# Patient Record
Sex: Male | Born: 1940 | Race: Black or African American | Hispanic: No | Smoking: Former smoker
Health system: Southern US, Community
[De-identification: ages and names within clinical notes are randomized; demographics above are authoritative.]

## PROBLEM LIST (undated history)

## (undated) DIAGNOSIS — I10 Essential (primary) hypertension: Secondary | ICD-10-CM

## (undated) DIAGNOSIS — E785 Hyperlipidemia, unspecified: Secondary | ICD-10-CM

## (undated) DIAGNOSIS — I2510 Atherosclerotic heart disease of native coronary artery without angina pectoris: Secondary | ICD-10-CM

## (undated) DIAGNOSIS — I4892 Unspecified atrial flutter: Secondary | ICD-10-CM

## (undated) DIAGNOSIS — G459 Transient cerebral ischemic attack, unspecified: Secondary | ICD-10-CM

## (undated) HISTORY — PX: OTHER SURGICAL HISTORY: SHX169

## (undated) HISTORY — PX: HERNIA REPAIR: SHX51

---

## 2010-08-27 ENCOUNTER — Emergency Department: Payer: Self-pay | Admitting: Emergency Medicine

## 2017-05-10 ENCOUNTER — Emergency Department
Admission: EM | Admit: 2017-05-10 | Discharge: 2017-05-10 | Disposition: A | Discharge status: Home / Self Care | Payer: Medicare HMO | Attending: Emergency Medicine | Admitting: Emergency Medicine

## 2017-05-10 ENCOUNTER — Encounter: Payer: Self-pay | Admitting: Emergency Medicine

## 2017-05-10 DIAGNOSIS — Z0130 Encounter for examination of blood pressure without abnormal findings: Secondary | ICD-10-CM | POA: Insufficient documentation

## 2017-05-10 DIAGNOSIS — Z5321 Procedure and treatment not carried out due to patient leaving prior to being seen by health care provider: Secondary | ICD-10-CM | POA: Insufficient documentation

## 2017-05-10 HISTORY — DX: Essential (primary) hypertension: I10

## 2017-05-10 NOTE — ED Notes (Signed)
After triage pt states his blood pressure was higher at home and that it is fine now.  Pt reports he wants to go home and eat [place] with family. Pt laughing appears in nad.  Pt advised he should stay and see MD, however, pt states he doesn't need to be seen anymore since his blood pressure is now better.

## 2017-05-10 NOTE — ED Triage Notes (Signed)
Pt to ed with c/o htn at home, mild headache, denies blurred vision, denies chest pain, denies sob.

## 2017-09-08 ENCOUNTER — Other Ambulatory Visit: Payer: Self-pay

## 2017-09-08 ENCOUNTER — Emergency Department: Payer: Medicare HMO

## 2017-09-08 ENCOUNTER — Encounter: Payer: Self-pay | Admitting: Emergency Medicine

## 2017-09-08 DIAGNOSIS — I4892 Unspecified atrial flutter: Secondary | ICD-10-CM | POA: Insufficient documentation

## 2017-09-08 DIAGNOSIS — Z8673 Personal history of transient ischemic attack (TIA), and cerebral infarction without residual deficits: Secondary | ICD-10-CM | POA: Diagnosis not present

## 2017-09-08 DIAGNOSIS — R079 Chest pain, unspecified: Secondary | ICD-10-CM | POA: Insufficient documentation

## 2017-09-08 DIAGNOSIS — I2510 Atherosclerotic heart disease of native coronary artery without angina pectoris: Secondary | ICD-10-CM | POA: Insufficient documentation

## 2017-09-08 DIAGNOSIS — R0602 Shortness of breath: Secondary | ICD-10-CM | POA: Diagnosis not present

## 2017-09-08 DIAGNOSIS — I10 Essential (primary) hypertension: Secondary | ICD-10-CM | POA: Diagnosis not present

## 2017-09-08 DIAGNOSIS — F17228 Nicotine dependence, chewing tobacco, with other nicotine-induced disorders: Secondary | ICD-10-CM | POA: Diagnosis not present

## 2017-09-08 LAB — BASIC METABOLIC PANEL
ANION GAP: 8 (ref 5–15)
BUN: 16 mg/dL (ref 6–20)
CHLORIDE: 106 mmol/L (ref 101–111)
CO2: 27 mmol/L (ref 22–32)
CREATININE: 0.97 mg/dL (ref 0.61–1.24)
Calcium: 9.4 mg/dL (ref 8.9–10.3)
GFR calc non Af Amer: >60 mL/min (ref >60)
Glucose, Bld: 101 mg/dL — ABNORMAL HIGH (ref 65–99)
POTASSIUM: 3.6 mmol/L (ref 3.5–5.1)
SODIUM: 141 mmol/L (ref 135–145)

## 2017-09-08 LAB — CBC
HCT: 42.9 % (ref 40.0–52.0)
Hemoglobin: 14.1 g/dL (ref 13.0–18.0)
MCH: 31.2 pg (ref 26.0–34.0)
MCHC: 32.8 g/dL (ref 32.0–36.0)
MCV: 95.2 fL (ref 80.0–100.0)
Platelets: 211 10*3/uL (ref 150–440)
RBC: 4.51 MIL/uL (ref 4.40–5.90)
RDW: 12.1 % (ref 11.5–14.5)
WBC: 7 10*3/uL (ref 3.8–10.6)

## 2017-09-08 LAB — TROPONIN I: Troponin I: <0.03 ng/mL (ref <0.03)

## 2017-09-08 NOTE — ED Notes (Signed)
Family to stat desk asking about wait time. Family given update on wait time. Family verbalized understanding.

## 2017-09-08 NOTE — ED Triage Notes (Signed)
Pt to ED via POV. Pt states that he has been having left sided chest pain since this morning. Pt states that is not having shortness of breath. Pt is in NAD at this time.

## 2017-09-09 ENCOUNTER — Emergency Department
Admission: EM | Admit: 2017-09-09 | Discharge: 2017-09-09 | Disposition: A | Discharge status: Home / Self Care | Payer: Medicare HMO | Attending: Emergency Medicine | Admitting: Emergency Medicine

## 2017-09-09 ENCOUNTER — Encounter: Payer: Self-pay | Admitting: Emergency Medicine

## 2017-09-09 DIAGNOSIS — I4892 Unspecified atrial flutter: Secondary | ICD-10-CM

## 2017-09-09 DIAGNOSIS — R079 Chest pain, unspecified: Secondary | ICD-10-CM | POA: Diagnosis not present

## 2017-09-09 HISTORY — DX: Atherosclerotic heart disease of native coronary artery without angina pectoris: I25.10

## 2017-09-09 HISTORY — DX: Transient cerebral ischemic attack, unspecified: G45.9

## 2017-09-09 HISTORY — DX: Unspecified atrial flutter: I48.92

## 2017-09-09 HISTORY — DX: Hyperlipidemia, unspecified: E78.5

## 2017-09-09 LAB — TROPONIN I: Troponin I: <0.03 ng/mL (ref <0.03)

## 2017-09-09 MED ORDER — NITROGLYCERIN 0.4 MG SL SUBL
0.4000 mg | SUBLINGUAL_TABLET | SUBLINGUAL | Status: DC | PRN
  Administered 2017-09-09 (×3): 0.4 mg via SUBLINGUAL
  Filled 2017-09-09: qty 1

## 2017-09-09 MED ORDER — DILTIAZEM HCL 60 MG PO TABS
30.0000 mg | ORAL_TABLET | Freq: Once | ORAL | Status: AC
  Administered 2017-09-09: 30 mg via ORAL

## 2017-09-09 MED ORDER — LORAZEPAM 1 MG PO TABS
1.0000 mg | ORAL_TABLET | Freq: Once | ORAL | Status: AC
  Administered 2017-09-09: 1 mg via ORAL
  Filled 2017-09-09: qty 1

## 2017-09-09 MED ORDER — DILTIAZEM HCL 60 MG PO TABS
ORAL_TABLET | ORAL | Status: AC
  Filled 2017-09-09: qty 1

## 2017-09-09 NOTE — ED Notes (Signed)
Report from butch, rn.

## 2017-09-09 NOTE — ED Provider Notes (Signed)
[facility] Emergency Department Provider Note   ____________________________________________   First MD Initiated Contact with Patient 09/09/17 0037     (approximate)  I have reviewed the triage vital signs and the nursing notes.   HISTORY  Chief Complaint Chest Pain    HPI Craig [name] is a 77 y.o. male who comes into the hospital today with some chest pain.  The patient states that he got up yesterday morning and his chest was hurting.  He also reports that his been doing funny things.  He states that the pain lightened up a little bit today but it is on his left side around where his heart is.  The patient has some mild shortness of breath and he reports that he has had no nausea no vomiting no dizziness or lightheadedness.  The patient rates his pain a 5 out of 10 in intensity currently.  He reports that it seems dull.  He has had similar pain in the past and he states that he has had a mild MI in the past although he does not believe it.  The patient decided to come into the hospital today for evaluation.  Past Medical History:  Diagnosis Date  . Atrial flutter ([place])   . Coronary artery disease   . Hyperlipidemia   . Hypertension   . TIA (transient ischemic attack)     There are no active problems to display for this patient.   Past Surgical History:  Procedure Laterality Date  . arm surgery Left   . HERNIA REPAIR      Prior to Admission medications   Not on File    Allergies Penicillins  No family history on file.  Social History Social History   Tobacco Use  . Smoking status: Former Research scientist (life sciences)  . Smokeless tobacco: Current User    Types: Snuff  Substance Use Topics  . Alcohol use: Yes  . Drug use: No    Review of Systems  Constitutional: No fever/chills Eyes: No visual changes. ENT: No sore throat. Cardiovascular:  chest pain. Respiratory:  shortness of breath. Gastrointestinal: No abdominal pain.  No nausea, no  vomiting.  No diarrhea.  No constipation. Genitourinary: Negative for dysuria. Musculoskeletal: Negative for back pain. Skin: Negative for rash. Neurological: Negative for headaches, focal weakness or numbness.   ____________________________________________   PHYSICAL EXAM:  VITAL SIGNS: ED Triage Vitals  Enc Vitals Group     BP 09/08/17 1712 (!) 160/95     Pulse Rate 09/08/17 1711 68     Resp 09/08/17 1711 16     Temp 09/08/17 1711 98 F (36.7 C)     Temp Source 09/08/17 1711 Oral     SpO2 09/08/17 1711 100 %     Weight 09/08/17 1713 185 lb (83.9 kg)     Height 09/08/17 1713 6' (1.829 m)     Head Circumference --      Peak Flow --      Pain Score 09/09/17 0035 4     Pain Loc --      Pain Edu? --      Excl. in [place]? --     Constitutional: Alert and oriented. Well appearing and in mild distress. Eyes: Conjunctivae are normal. PERRL. EOMI. Head: Atraumatic. Nose: No congestion/rhinnorhea. Mouth/Throat: Mucous membranes are moist.  Oropharynx non-erythematous. Cardiovascular: Normal rate, irregular rhythm. Grossly normal heart sounds.  Good peripheral circulation. Respiratory: Normal respiratory effort.  No retractions. Lungs CTAB. Gastrointestinal: Soft and nontender. No  distention.  Positive bowel sounds Musculoskeletal: No lower extremity tenderness nor edema.   Neurologic:  Normal speech and language. No gross focal neurologic deficits are appreciated. No gait instability. Skin:  Skin is warm, dry and intact. No rash noted. Psychiatric: Mood and affect are normal. Speech and behavior are normal.  ____________________________________________   LABS (all labs ordered are listed, but only abnormal results are displayed)  Labs Reviewed  BASIC METABOLIC PANEL - Abnormal; Notable for the following components:      Result Value   Glucose, Bld 101 (*)    All other components within normal limits  CBC  TROPONIN I  TROPONIN I    ____________________________________________  EKG  ED ECG REPORT I, Loney Hering, the attending physician, personally viewed and interpreted this ECG.   Date: 09/08/2017  EKG Time: 1717  Rate: 92  Rhythm: atrial flutter with variable block,  Axis: left axis deviation  Intervals:none  ST&T Change: none  ____________________________________________  RADIOLOGY  ED MD interpretation:  CXR: No active cardiopulmonary disease  Official radiology report(s): Dg Chest 2 View  Result Date: 09/08/2017 CLINICAL DATA:  Left-sided chest pain since this morning. EXAM: CHEST - 2 VIEW COMPARISON:  Report from 01/10/2000 FINDINGS: Cardiomegaly with minimal aortic atherosclerosis is noted. No aortic aneurysm. Lungs are clear without pulmonary consolidation, effusion or pneumothorax. Old left-sided rib fractures involving the left seventh through ninth ribs. IMPRESSION: No active cardiopulmonary disease. Cardiomegaly with mild aortic atherosclerosis. Electronically Signed   By: Ashley Royalty M.D.   On: 09/08/2017 17:44    ____________________________________________   PROCEDURES  Procedure(s) performed: None  Procedures  Critical Care performed: No  ____________________________________________   INITIAL IMPRESSION / ASSESSMENT AND PLAN / ED COURSE  As part of my medical decision making, I reviewed the following data within the electronic MEDICAL RECORD NUMBER Notes from prior ED visits and [place] Controlled Substance Database   This is a 77 year old male who comes into the hospital today with some chest pain.  My differential diagnosis includes acute coronary syndrome, pneumonia, musculoskeletal pain, gastritis versus reflux.  We did check a CBC, BMP and a troponin.  The patient also received a chest x-ray.  The patient's initial blood work is unremarkable.  His EKG did show atrial flutter but the patient was seen by Dr. Clayborn Bigness 1 year ago and has a diagnosis of atrial flutter for  which he is supposed to be taking Xarelto and aspirin.  Initially the patient's rate did appear on the monitor that it had increased and was fast.  I did give the patient a dose of diltiazem orally.  I did reassess the patient as looking at the EKG it did not appear as fast as recorded.  The nurse did a palpable rate check and the patient's rate was in the 50s.  We will repeat the troponin and the patient will also receive some nitroglycerin for pain.  He will be reassessed.     The patient's repeat troponin is negative.  We have continue to check the patient's pulse by palpation and it still remains in the 50s.  He did receive a dose of Ativan in the event that he was having any withdrawal since he does drink a shot of liquor daily.  The patient will be discharged to follow-up with Dr. Clayborn Bigness or with Dr. Rockey Situ should he desire to follow-up with a new physician as he requested.  ____________________________________________   FINAL CLINICAL IMPRESSION(S) / ED DIAGNOSES  Final diagnoses:  Chest pain,  unspecified type  Atrial flutter, unspecified type [facility])     ED Discharge Orders    None       Note:  This document was prepared using Dragon voice recognition software and may include unintentional dictation errors.    Loney Hering, MD 09/09/17 [phone]

## 2017-09-09 NOTE — ED Notes (Signed)
Pt refused meal tray or something to drink when offered. Pt stated he would "get something to eat on the way home".

## 2017-09-09 NOTE — ED Notes (Signed)
Spoke with charge RN dawn. Pt cannot leave post ativan administration for 4-6 hours. Pt informed he will need someone to drive him home, however pt states he cannot secure a ride. Pt informed he will need to wait until 7 or 8 am to be discharged. Pt agrees. Pt informed will move him to a hallway bed to await discharge.

## 2017-09-09 NOTE — ED Notes (Signed)
Sandwich tray and po fluids provided.

## 2017-09-09 NOTE — Discharge Instructions (Addendum)
I have evaluated your chest pain and your heart enzymes are negative.  You do have an abnormal heart rhythm but it has been diagnosed previously according to Dr. Etta Quill note.  Please follow-up with your cardiologist or with Dr. Rockey Situ for further evaluation of your symptoms.  Please ensure that you are taking your medications appropriately.

## 2017-09-09 NOTE — ED Notes (Signed)
MD order to discharge patient.

## 2017-09-09 NOTE — ED Notes (Signed)
Per Dr Dahlia Client, okay for pt to eat and drink.

## 2017-10-31 ENCOUNTER — Emergency Department: Payer: Medicare HMO

## 2017-10-31 ENCOUNTER — Other Ambulatory Visit: Payer: Self-pay

## 2017-10-31 ENCOUNTER — Emergency Department
Admission: EM | Admit: 2017-10-31 | Discharge: 2017-10-31 | Disposition: A | Discharge status: Home / Self Care | Payer: Medicare HMO | Attending: Emergency Medicine | Admitting: Emergency Medicine

## 2017-10-31 DIAGNOSIS — I119 Hypertensive heart disease without heart failure: Secondary | ICD-10-CM | POA: Insufficient documentation

## 2017-10-31 DIAGNOSIS — M549 Dorsalgia, unspecified: Secondary | ICD-10-CM

## 2017-10-31 DIAGNOSIS — I2510 Atherosclerotic heart disease of native coronary artery without angina pectoris: Secondary | ICD-10-CM | POA: Insufficient documentation

## 2017-10-31 DIAGNOSIS — M545 Low back pain: Secondary | ICD-10-CM | POA: Diagnosis not present

## 2017-10-31 DIAGNOSIS — R1084 Generalized abdominal pain: Secondary | ICD-10-CM | POA: Diagnosis present

## 2017-10-31 DIAGNOSIS — Z87891 Personal history of nicotine dependence: Secondary | ICD-10-CM | POA: Insufficient documentation

## 2017-10-31 LAB — URINALYSIS, COMPLETE (UACMP) WITH MICROSCOPIC
BILIRUBIN URINE: NEGATIVE
Bacteria, UA: NONE SEEN
GLUCOSE, UA: NEGATIVE mg/dL
HGB URINE DIPSTICK: NEGATIVE
KETONES UR: NEGATIVE mg/dL
LEUKOCYTES UA: NEGATIVE
NITRITE: NEGATIVE
PROTEIN: NEGATIVE mg/dL
Specific Gravity, Urine: 1.014 (ref 1.005–1.030)
WBC UA: NONE SEEN WBC/hpf (ref 0–5)
pH: 6 (ref 5.0–8.0)

## 2017-10-31 LAB — BASIC METABOLIC PANEL
ANION GAP: 10 (ref 5–15)
BUN: 12 mg/dL (ref 6–20)
CO2: 25 mmol/L (ref 22–32)
Calcium: 9.5 mg/dL (ref 8.9–10.3)
Chloride: 100 mmol/L — ABNORMAL LOW (ref 101–111)
Creatinine, Ser: 0.9 mg/dL (ref 0.61–1.24)
GFR calc Af Amer: >60 mL/min (ref >60)
GLUCOSE: 94 mg/dL (ref 65–99)
POTASSIUM: 3.1 mmol/L — AB (ref 3.5–5.1)
Sodium: 135 mmol/L (ref 135–145)

## 2017-10-31 LAB — CBC
HEMATOCRIT: 43.5 % (ref 40.0–52.0)
HEMOGLOBIN: 14.9 g/dL (ref 13.0–18.0)
MCH: 32.2 pg (ref 26.0–34.0)
MCHC: 34.2 g/dL (ref 32.0–36.0)
MCV: 94.2 fL (ref 80.0–100.0)
Platelets: 211 10*3/uL (ref 150–440)
RBC: 4.61 MIL/uL (ref 4.40–5.90)
RDW: 12.3 % (ref 11.5–14.5)
WBC: 6.6 10*3/uL (ref 3.8–10.6)

## 2017-10-31 MED ORDER — CYCLOBENZAPRINE HCL 10 MG PO TABS: 10.0000 mg | ORAL_TABLET | Freq: Three times a day (TID) | ORAL | 0 refills | Status: AC | PRN

## 2017-10-31 MED ORDER — IBUPROFEN 600 MG PO TABS: 600.0000 mg | ORAL_TABLET | Freq: Four times a day (QID) | ORAL | 0 refills | Status: DC | PRN

## 2017-10-31 MED ORDER — IBUPROFEN 400 MG PO TABS
600.0000 mg | ORAL_TABLET | Freq: Once | ORAL | Status: AC
  Administered 2017-10-31: 600 mg via ORAL
  Filled 2017-10-31: qty 2

## 2017-10-31 NOTE — ED Triage Notes (Signed)
To ER c/o left flank pain X 3-4 days. Denies NVD. Denies CP or SOB. Pt alert and oriented X4, active, cooperative, pt in NAD. RR even and unlabored, color WNL.  States he urinates too quickly.

## 2017-10-31 NOTE — ED Notes (Signed)
Pt presents with left lower flank pain that radiates to the other side at times. Pt states that he has a hx of prostate cancer and that when he urinates, that he is not peeing any different than he used to. Pt states that he has always had some burning on urination as well as some fast streams of urine. Pt states that pain is "6/10." No N/V/D. AxOx4.

## 2017-10-31 NOTE — Discharge Instructions (Signed)
Your CT scan shows a 4 cm mass or nodule in the liver.  You will need either an MRI or a contrast CT scan of the liver to further evaluate this.  You should make an appointment to follow-up with your doctor within the next 1 to 2 weeks, and show this discharge paperwork.  Your doctor can order this test as an outpatient.  You can take the medications as needed for the pain.  Do not drive or operate machinery while taking the Flexeril (cyclobenzaprine).    Return to the ER for new, worsening, or persistent severe pain, difficulty walking, weakness or numbness, vomiting, fever, or any other new or worsening symptoms that concern you.

## 2017-10-31 NOTE — ED Provider Notes (Signed)
[facility] Emergency Department Provider Note ____________________________________________   First MD Initiated Contact with Patient 10/31/17 1823     (approximate)  I have reviewed the triage vital signs and the nursing notes.   HISTORY  Chief Complaint Flank Pain    HPI Craig [name] is a 77 y.o. male with PMH as noted below as well as stated history of prostate cancer who presents with bilateral flank and lower back pain over the last 2 weeks, constant but intermittently worsened, and not associated with any specific position or activity.  He reports some chronic difficulty urinating due to his prostate problems.  No acute urinary symptoms.  No chest pain, abdominal pain, or difficulty breathing.  No fevers.  No prior history of this pain.   Past Medical History:  Diagnosis Date  . Atrial flutter ([place])   . Coronary artery disease   . Hyperlipidemia   . Hypertension   . TIA (transient ischemic attack)     There are no active problems to display for this patient.   Past Surgical History:  Procedure Laterality Date  . arm surgery Left   . HERNIA REPAIR      Prior to Admission medications   Medication Sig Start Date End Date Taking? Authorizing Provider  cyclobenzaprine (FLEXERIL) 10 MG tablet Take 1 tablet (10 mg total) by mouth 3 (three) times daily as needed for up to 5 days for muscle spasms. 10/31/17 11/05/17  Arta Silence, MD  ibuprofen (ADVIL,MOTRIN) 600 MG tablet Take 1 tablet (600 mg total) by mouth every 6 (six) hours as needed. 10/31/17   Arta Silence, MD    Allergies Penicillins  No family history on file.  Social History Social History   Tobacco Use  . Smoking status: Former Research scientist (life sciences)  . Smokeless tobacco: Current User    Types: Snuff  Substance Use Topics  . Alcohol use: Yes  . Drug use: No    Review of Systems  Constitutional: No fever. Eyes: No redness. ENT: No neck pain. Cardiovascular: Denies chest  pain. Respiratory: Denies shortness of breath. Gastrointestinal: No abdominal pain.  Genitourinary: Negative for dysuria.  Musculoskeletal: Positive for back pain. Skin: Negative for rash. Neurological: Negative for headaches, focal weakness or numbness.   ____________________________________________   PHYSICAL EXAM:  VITAL SIGNS: ED Triage Vitals [10/31/17 1504]  Enc Vitals Group     BP 121/81     Pulse Rate 100     Resp 18     Temp 98 F (36.7 C)     Temp Source Oral     SpO2 100 %     Weight 185 lb (83.9 kg)     Height      Head Circumference      Peak Flow      Pain Score 6     Pain Loc      Pain Edu?      Excl. in [place]?     Constitutional: Alert and oriented. Well appearing and in no acute distress. Eyes: Conjunctivae are normal.  Head: Atraumatic. Nose: No congestion/rhinnorhea. Mouth/Throat: Mucous membranes are moist.   Neck: Normal range of motion.  Cardiovascular:  Good peripheral circulation. Respiratory: Normal respiratory effort.  Gastrointestinal: Soft and nontender. No distention.  Genitourinary: No CVA tenderness. Musculoskeletal: No lower extremity edema.  Extremities warm and well perfused.  Bilateral mild lumbar paraspinal tenderness.  Mild midline tenderness with no step-off or crepitus. Neurologic:  Normal speech and language.  5/5 motor strength and intact  sensation to bilateral lower extremities.  Negative straight leg raise bilaterally.  No gross focal neurologic deficits are appreciated.  Skin:  Skin is warm and dry. No rash noted. Psychiatric: Mood and affect are normal. Speech and behavior are normal.  ____________________________________________   LABS (all labs ordered are listed, but only abnormal results are displayed)  Labs Reviewed  URINALYSIS, COMPLETE (UACMP) WITH MICROSCOPIC - Abnormal; Notable for the following components:      Result Value   Color, Urine YELLOW (*)    APPearance CLEAR (*)    All other components within  normal limits  BASIC METABOLIC PANEL - Abnormal; Notable for the following components:   Potassium 3.1 (*)    Chloride 100 (*)    All other components within normal limits  CBC   ____________________________________________  EKG   ____________________________________________  RADIOLOGY  CT abdomen: 4 cm liver mass.  No other acute findings. CT lumbar spine: No acute abnormalities.  ____________________________________________   PROCEDURES  Procedure(s) performed: No  Procedures  Critical Care performed: No ____________________________________________   INITIAL IMPRESSION / ASSESSMENT AND PLAN / ED COURSE  Pertinent labs & imaging results that were available during my care of the patient were reviewed by me and considered in my medical decision making (see chart for details).  77 year old male with PMH as noted above presents with subacute development of bilateral lower back and flank pain with no significant associated symptoms.  On exam, the patient is relatively well-appearing, vital signs are normal except for borderline heart rate, and the remainder the exam is as described above.  Past medical records reviewed in Epic; patient had one prior ED visit 2 months ago for chest pain with negative work-up.  Differential includes most likely muscular strain/spasm, DJD or arthritis, or other benign cause.  Patient's UA and lab work-up are unremarkable.  Given his apparent history of prostate cancer, malignancy or pathologic fracture is also a possibility.  I will obtain a noncontrast CT of the abdomen as well as the lumbar spine to further evaluate.  If this is negative, anticipate discharge home with outpatient follow-up.    ----------------------------------------- 8:26 PM on 10/31/2017 -----------------------------------------  CT shows no acute findings to explain the patient's pain.  No evidence of pathologic fracture or ureteral stone.  There is a 4 cm mass in the  liver which is not consistent with patient's acute symptoms.  Patient had another brief episode of more severe pain which appears to be consistent with a spasm, and was relieved with certain positioning.  Overall presentation is most consistent with muscle strain/spasm.  I will discharge with muscle relaxant and NSAID.  I had an extensive discussion with the patient about the results of his work-up including the liver findings on the CT  And the importance of close outpatient follow-up for additional outpatient imaging.  Patient expressed understanding.  He agrees to follow-up with his primary care doctor.  Return precautions given, and the patient expressed understanding of these as well.  ____________________________________________   FINAL CLINICAL IMPRESSION(S) / ED DIAGNOSES  Final diagnoses:  Back pain      NEW MEDICATIONS STARTED DURING THIS VISIT:  New Prescriptions   CYCLOBENZAPRINE (FLEXERIL) 10 MG TABLET    Take 1 tablet (10 mg total) by mouth 3 (three) times daily as needed for up to 5 days for muscle spasms.   IBUPROFEN (ADVIL,MOTRIN) 600 MG TABLET    Take 1 tablet (600 mg total) by mouth every 6 (six) hours as needed.  Note:  This document was prepared using Dragon voice recognition software and may include unintentional dictation errors.    Arta Silence, MD 10/31/17 2027

## 2017-12-25 ENCOUNTER — Encounter: Payer: Self-pay | Admitting: Cardiovascular Disease

## 2017-12-25 ENCOUNTER — Ambulatory Visit (INDEPENDENT_AMBULATORY_CARE_PROVIDER_SITE_OTHER): Payer: Medicare HMO | Admitting: Cardiovascular Disease

## 2017-12-25 VITALS — BP 130/76 | HR 58 | Ht 72.0 in | Wt 179.0 lb

## 2017-12-25 DIAGNOSIS — I25118 Atherosclerotic heart disease of native coronary artery with other forms of angina pectoris: Secondary | ICD-10-CM

## 2017-12-25 DIAGNOSIS — Z955 Presence of coronary angioplasty implant and graft: Secondary | ICD-10-CM | POA: Diagnosis not present

## 2017-12-25 DIAGNOSIS — R079 Chest pain, unspecified: Secondary | ICD-10-CM

## 2017-12-25 DIAGNOSIS — Z87891 Personal history of nicotine dependence: Secondary | ICD-10-CM | POA: Diagnosis not present

## 2017-12-25 DIAGNOSIS — E785 Hyperlipidemia, unspecified: Secondary | ICD-10-CM | POA: Insufficient documentation

## 2017-12-25 DIAGNOSIS — I10 Essential (primary) hypertension: Secondary | ICD-10-CM | POA: Diagnosis not present

## 2017-12-25 DIAGNOSIS — R413 Other amnesia: Secondary | ICD-10-CM | POA: Insufficient documentation

## 2017-12-25 DIAGNOSIS — E782 Mixed hyperlipidemia: Secondary | ICD-10-CM | POA: Diagnosis not present

## 2017-12-25 DIAGNOSIS — I2510 Atherosclerotic heart disease of native coronary artery without angina pectoris: Secondary | ICD-10-CM | POA: Insufficient documentation

## 2017-12-25 NOTE — Progress Notes (Signed)
Cardiology Office Note  Date:  12/25/2017   ID:  Craig [name], DOB [birth year], MRN [id number]  PCP:  [facility]   Chief Complaint  Patient presents with  . New Patient (Initial Visit)    Referral from [facility] for Hypertension. Patient denies chest pain and SOB. Meds reviewed verbally with patient.     HPI:  Craig [name] is a 77 year old gentleman with past medical history of Atrial fibrillation Atrial flutter Hypertension Previously referred to EP but refused ablation for atrial flutter TIA PAD Coronary disease Hyperlipidemia Syncope Smoking, quit On aricept Who presents by referral from [facility] clinic  Mildly agitated on today's visit Blood pressure up and down, but does not have any numbers from home Does not think it is too low Sometimes runs high in certain situations  Previously evaluated by cardiology at [facility] He denies any chest pain concerning for angina  Does not like pills Does not want a cholesterol medication at this time  Active with no regular exercise program  Long discussion concerning his arrhythmia and atrial flutter Reports he is asymptomatic Compliant with his anticoagulation  Notes reviewed indicating MI in 1990s with stent placement  EKG personally reviewed by myself on todays visit Shows atrial flutter, ventricular rate 22    PMH:   has a past medical history of Atrial flutter ([place]), Coronary artery disease, Hyperlipidemia, Hypertension, and TIA (transient ischemic attack).  PSH:    Past Surgical History:  Procedure Laterality Date  . arm surgery Left   . HERNIA REPAIR      Current Outpatient Medications  Medication Sig Dispense Refill  . donepezil (ARICEPT) 10 MG tablet TK 1 T PO HS FOR MEMORY  5  . lisinopril-hydrochlorothiazide (PRINZIDE,ZESTORETIC) 20-25 MG tablet TAKE ONE TABLET BY MOUTH EVERY DAY    . rivaroxaban (XARELTO) 20 MG TABS tablet Take by mouth.     No current facility-administered  medications for this visit.      Allergies:   Penicillins   Social History:  The patient  reports that he has quit smoking. He smoked 0.00 packs per day. His smokeless tobacco use includes snuff. He reports that he drinks alcohol. He reports that he does not use drugs.   Family History:   family history is not on file.    Review of Systems: Review of Systems  Constitutional: Negative.   Respiratory: Negative.   Cardiovascular: Negative.   Gastrointestinal: Negative.   Musculoskeletal: Negative.   Neurological: Negative.   Psychiatric/Behavioral: The patient is nervous/anxious.   All other systems reviewed and are negative.    PHYSICAL EXAM: VS:  BP 130/76 (BP Location: Right Arm, Patient Position: Sitting, Cuff Size: Normal)   Pulse (!) 58   Ht 6' (1.829 m)   Wt 179 lb (81.2 kg)   BMI 24.28 kg/m  , BMI Body mass index is 24.28 kg/m. GEN: Well nourished, well developed, in no acute distress  HEENT: normal  Neck: no JVD, carotid bruits, or masses Cardiac: RRR; no murmurs, rubs, or gallops,no edema  Respiratory:  clear to auscultation bilaterally, normal work of breathing GI: soft, nontender, nondistended, + BS MS: no deformity or atrophy  Skin: warm and dry, no rash Neuro:  Strength and sensation are intact Psych: euthymic mood, full affect   Recent Labs: 10/31/2017: BUN 12; Creatinine, Ser 0.90; Hemoglobin 14.9; Platelets 211; Potassium 3.1; Sodium 135    Lipid Panel No results found for: CHOL, HDL, LDLCALC, TRIG    Wt Readings from Last  3 Encounters:  12/25/17 179 lb (81.2 kg)  10/31/17 185 lb (83.9 kg)  09/08/17 185 lb (83.9 kg)       ASSESSMENT AND PLAN:  Coronary artery disease of native artery of native heart with stable angina pectoris (HCC) Currently with no symptoms of angina. No further workup at this time. Continue current medication regimen.  History of coronary artery stent placement We have requested records Currently with no anginal  symptoms  Mixed hyperlipidemia Does not want cholesterol medication For first to keep his list very short  Former smoker Previously active smoker now very passive smoker Recommended smoking cessation  Essential hypertension Blood pressure is well controlled on today's visit. No changes made to the medications.  Memory loss On Aricept Management primary care  Disposition:   F/U  6 months   Total encounter time more than 60 minutes  Greater than 50% was spent in counseling and coordination of care with the patient    Orders Placed This Encounter  Procedures  . EKG 12-Lead     Signed, Esmond Plants, M.D., Ph.D. 12/25/2017  Craig [name], [place]

## 2017-12-25 NOTE — Patient Instructions (Signed)

## 2019-02-13 ENCOUNTER — Encounter: Payer: Self-pay | Admitting: Urology

## 2019-02-13 ENCOUNTER — Ambulatory Visit: Payer: Self-pay | Admitting: Urology

## 2019-05-14 IMAGING — CR DG CHEST 2V
1 series · 2 of 2 positions shown · non-contrast
Comparison: Report from 01/10/2000

CLINICAL DATA: Left-sided chest pain since this morning.

EXAM:
CHEST - 2 VIEW

[Series 1: dg chest 2 view · 0.14mm/px · 2 of 2 slices shown]
[im 1/2]
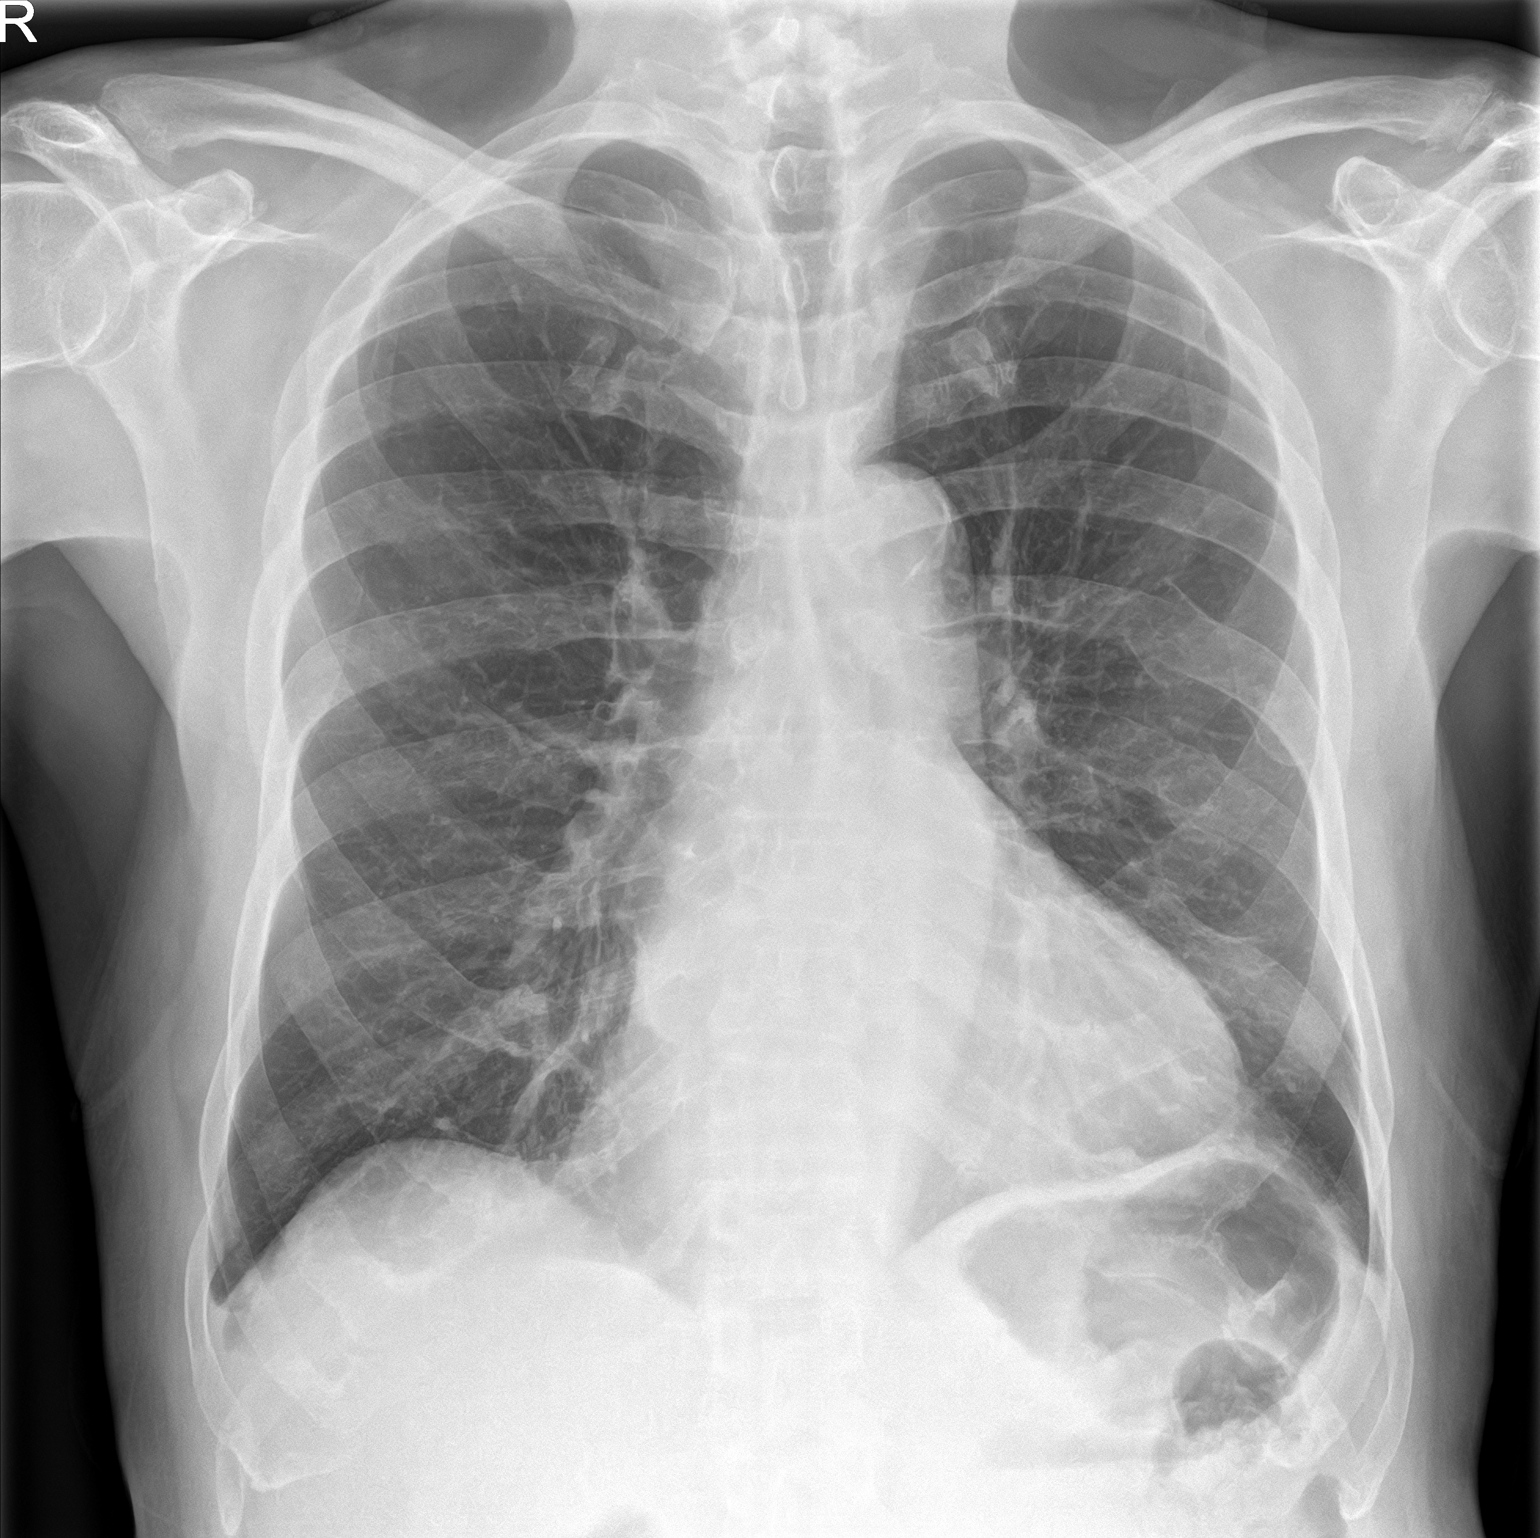
[im 2/2]
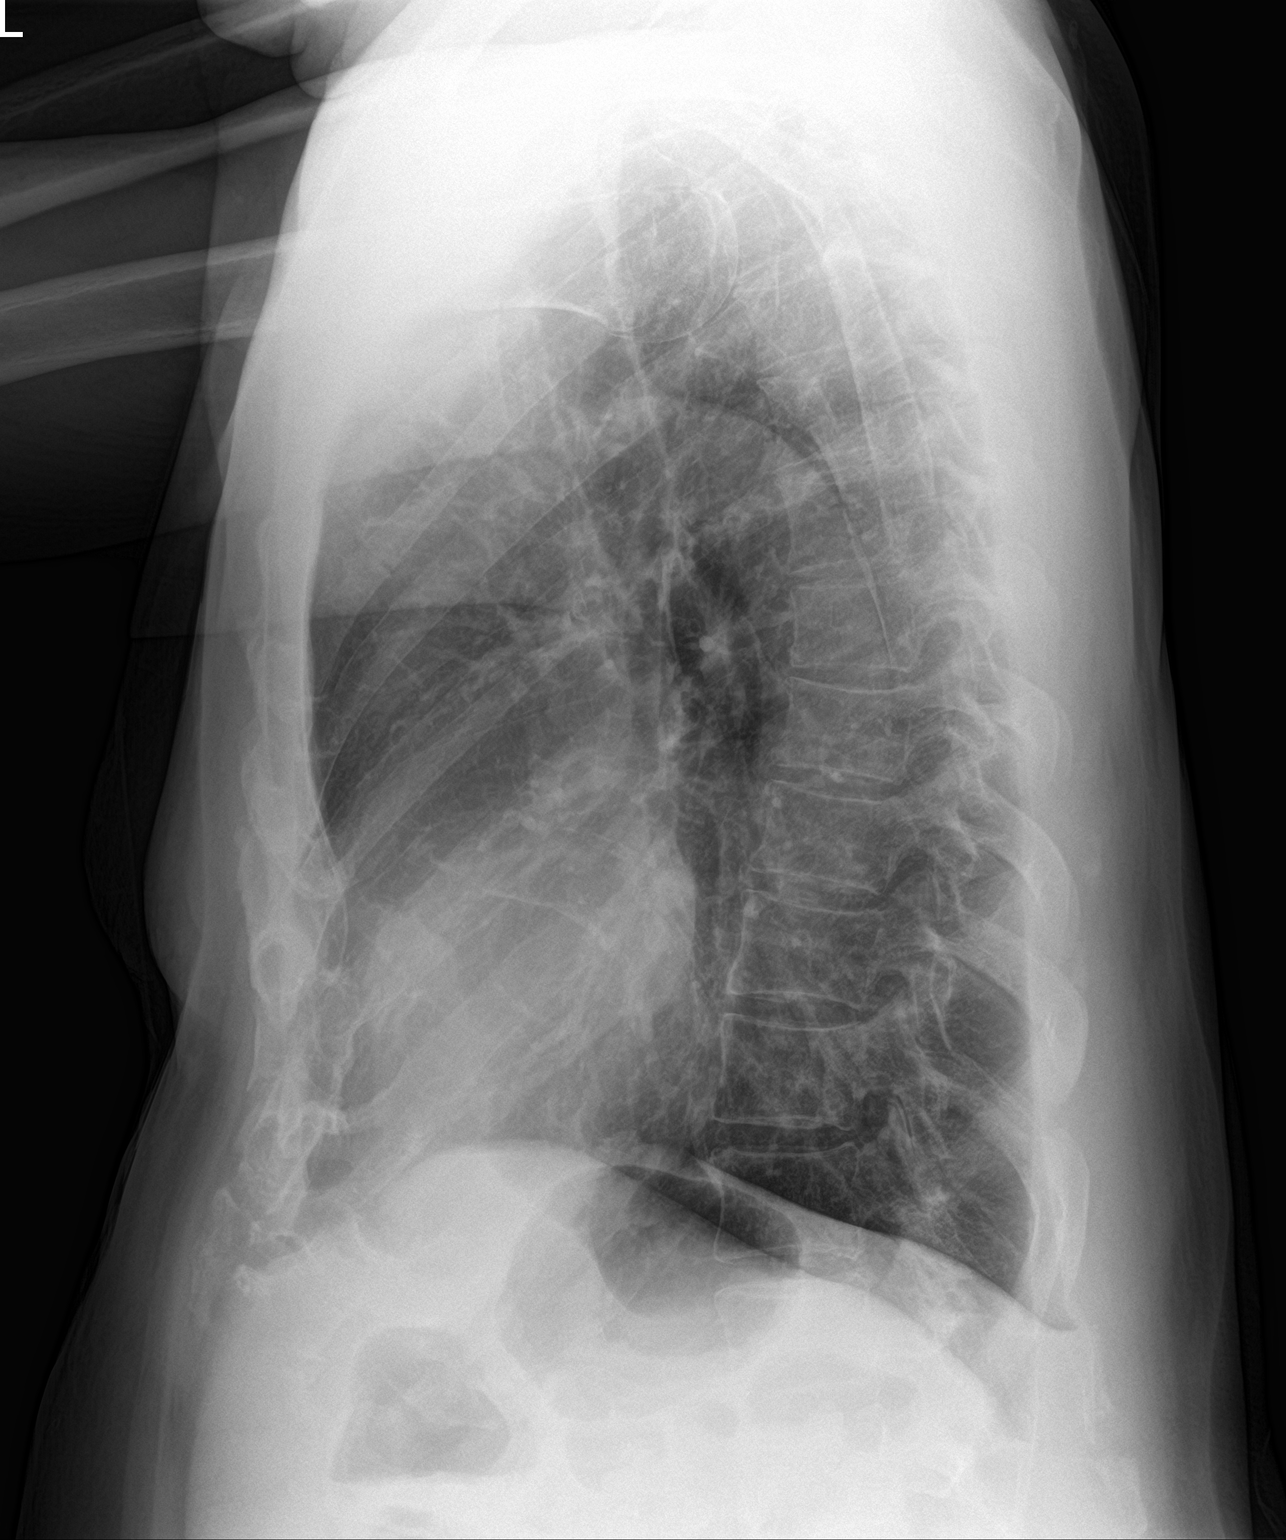

[2 of 2 positions shown; findings below may reference images not displayed]

FINDINGS: Cardiomegaly with minimal aortic atherosclerosis is noted. No aortic
aneurysm. Lungs are clear without pulmonary consolidation, effusion
or pneumothorax. Old left-sided rib fractures involving the left
seventh through ninth ribs.
IMPRESSION: No active cardiopulmonary disease. Cardiomegaly with mild aortic
atherosclerosis.

## 2019-10-17 ENCOUNTER — Other Ambulatory Visit: Payer: Self-pay | Admitting: Family Medicine

## 2019-10-22 ENCOUNTER — Ambulatory Visit: Payer: No Typology Code available for payment source | Admitting: Urology

## 2019-10-22 ENCOUNTER — Encounter: Payer: Self-pay | Admitting: Urology

## 2020-04-20 ENCOUNTER — Encounter: Payer: Self-pay | Admitting: Emergency Medicine

## 2020-04-20 ENCOUNTER — Other Ambulatory Visit: Payer: Self-pay

## 2020-04-20 ENCOUNTER — Emergency Department: Payer: Medicare (Managed Care)

## 2020-04-20 ENCOUNTER — Inpatient Hospital Stay
Admission: EM | Admit: 2020-04-20 | Discharge: 2020-04-26 | DRG: 884 | Disposition: A | Discharge status: Home Health | Payer: Medicare (Managed Care) | Attending: Internal Medicine | Admitting: Internal Medicine

## 2020-04-20 DIAGNOSIS — F17290 Nicotine dependence, other tobacco product, uncomplicated: Secondary | ICD-10-CM | POA: Diagnosis present

## 2020-04-20 DIAGNOSIS — E785 Hyperlipidemia, unspecified: Secondary | ICD-10-CM | POA: Diagnosis present

## 2020-04-20 DIAGNOSIS — F03918 Unspecified dementia, unspecified severity, with other behavioral disturbance: Secondary | ICD-10-CM

## 2020-04-20 DIAGNOSIS — E43 Unspecified severe protein-calorie malnutrition: Secondary | ICD-10-CM | POA: Diagnosis present

## 2020-04-20 DIAGNOSIS — R001 Bradycardia, unspecified: Secondary | ICD-10-CM | POA: Diagnosis present

## 2020-04-20 DIAGNOSIS — Z20822 Contact with and (suspected) exposure to covid-19: Secondary | ICD-10-CM | POA: Diagnosis present

## 2020-04-20 DIAGNOSIS — I119 Hypertensive heart disease without heart failure: Secondary | ICD-10-CM | POA: Diagnosis present

## 2020-04-20 DIAGNOSIS — I4892 Unspecified atrial flutter: Secondary | ICD-10-CM | POA: Diagnosis present

## 2020-04-20 DIAGNOSIS — I252 Old myocardial infarction: Secondary | ICD-10-CM

## 2020-04-20 DIAGNOSIS — R4182 Altered mental status, unspecified: Secondary | ICD-10-CM | POA: Diagnosis not present

## 2020-04-20 DIAGNOSIS — Z8673 Personal history of transient ischemic attack (TIA), and cerebral infarction without residual deficits: Secondary | ICD-10-CM

## 2020-04-20 DIAGNOSIS — E871 Hypo-osmolality and hyponatremia: Secondary | ICD-10-CM | POA: Diagnosis not present

## 2020-04-20 DIAGNOSIS — F0390 Unspecified dementia without behavioral disturbance: Secondary | ICD-10-CM

## 2020-04-20 DIAGNOSIS — R413 Other amnesia: Secondary | ICD-10-CM | POA: Diagnosis present

## 2020-04-20 DIAGNOSIS — I4891 Unspecified atrial fibrillation: Secondary | ICD-10-CM | POA: Diagnosis present

## 2020-04-20 DIAGNOSIS — Z602 Problems related to living alone: Secondary | ICD-10-CM | POA: Diagnosis present

## 2020-04-20 DIAGNOSIS — Z880 Allergy status to penicillin: Secondary | ICD-10-CM

## 2020-04-20 DIAGNOSIS — R54 Age-related physical debility: Secondary | ICD-10-CM | POA: Diagnosis present

## 2020-04-20 DIAGNOSIS — F0391 Unspecified dementia with behavioral disturbance: Secondary | ICD-10-CM | POA: Diagnosis not present

## 2020-04-20 DIAGNOSIS — F17210 Nicotine dependence, cigarettes, uncomplicated: Secondary | ICD-10-CM | POA: Diagnosis present

## 2020-04-20 DIAGNOSIS — Z79899 Other long term (current) drug therapy: Secondary | ICD-10-CM

## 2020-04-20 DIAGNOSIS — I2510 Atherosclerotic heart disease of native coronary artery without angina pectoris: Secondary | ICD-10-CM | POA: Diagnosis present

## 2020-04-20 DIAGNOSIS — I10 Essential (primary) hypertension: Secondary | ICD-10-CM | POA: Diagnosis present

## 2020-04-20 DIAGNOSIS — Z955 Presence of coronary angioplasty implant and graft: Secondary | ICD-10-CM

## 2020-04-20 LAB — URINALYSIS, COMPLETE (UACMP) WITH MICROSCOPIC
Bacteria, UA: NONE SEEN
Bilirubin Urine: NEGATIVE
Glucose, UA: NEGATIVE mg/dL
Hgb urine dipstick: NEGATIVE
Ketones, ur: NEGATIVE mg/dL
Leukocytes,Ua: NEGATIVE
Nitrite: NEGATIVE
Protein, ur: NEGATIVE mg/dL
Specific Gravity, Urine: 1.01 (ref 1.005–1.030)
Squamous Epithelial / HPF: NONE SEEN (ref 0–5)
WBC, UA: NONE SEEN WBC/hpf (ref 0–5)
pH: 6 (ref 5.0–8.0)

## 2020-04-20 LAB — URINE DRUG SCREEN, QUALITATIVE (ARMC ONLY)
Amphetamines, Ur Screen: NOT DETECTED
Barbiturates, Ur Screen: NOT DETECTED
Benzodiazepine, Ur Scrn: NOT DETECTED
Cannabinoid 50 Ng, Ur ~~LOC~~: NOT DETECTED
Cocaine Metabolite,Ur ~~LOC~~: NOT DETECTED
MDMA (Ecstasy)Ur Screen: NOT DETECTED
Methadone Scn, Ur: NOT DETECTED
Opiate, Ur Screen: NOT DETECTED
Phencyclidine (PCP) Ur S: NOT DETECTED
Tricyclic, Ur Screen: NOT DETECTED

## 2020-04-20 LAB — MAGNESIUM: Magnesium: 2.1 mg/dL (ref 1.7–2.4)

## 2020-04-20 LAB — COMPREHENSIVE METABOLIC PANEL
ALT: 15 U/L (ref 0–44)
AST: 17 U/L (ref 15–41)
Albumin: 3.3 g/dL — ABNORMAL LOW (ref 3.5–5.0)
Alkaline Phosphatase: 57 U/L (ref 38–126)
Anion gap: 9 (ref 5–15)
BUN: 12 mg/dL (ref 8–23)
CO2: 25 mmol/L (ref 22–32)
Calcium: 8.7 mg/dL — ABNORMAL LOW (ref 8.9–10.3)
Chloride: 97 mmol/L — ABNORMAL LOW (ref 98–111)
Creatinine, Ser: 0.8 mg/dL (ref 0.61–1.24)
GFR, Estimated: >60 mL/min (ref >60)
Glucose, Bld: 86 mg/dL (ref 70–99)
Potassium: 4 mmol/L (ref 3.5–5.1)
Sodium: 131 mmol/L — ABNORMAL LOW (ref 135–145)
Total Bilirubin: 1 mg/dL (ref 0.3–1.2)
Total Protein: 9 g/dL — ABNORMAL HIGH (ref 6.5–8.1)

## 2020-04-20 LAB — RESPIRATORY PANEL BY RT PCR (FLU A&B, COVID)
Influenza A by PCR: NEGATIVE
Influenza B by PCR: NEGATIVE
SARS Coronavirus 2 by RT PCR: NEGATIVE

## 2020-04-20 LAB — TROPONIN I (HIGH SENSITIVITY)
Troponin I (High Sensitivity): 10 ng/L (ref <18)
Troponin I (High Sensitivity): 10 ng/L (ref <18)

## 2020-04-20 LAB — CBC
HCT: 40.2 % (ref 39.0–52.0)
Hemoglobin: 13.3 g/dL (ref 13.0–17.0)
MCH: 31.7 pg (ref 26.0–34.0)
MCHC: 33.1 g/dL (ref 30.0–36.0)
MCV: 95.9 fL (ref 80.0–100.0)
Platelets: 143 10*3/uL — ABNORMAL LOW (ref 150–400)
RBC: 4.19 MIL/uL — ABNORMAL LOW (ref 4.22–5.81)
RDW: 11.8 % (ref 11.5–15.5)
WBC: 3.4 10*3/uL — ABNORMAL LOW (ref 4.0–10.5)
nRBC: 0 % (ref 0.0–0.2)

## 2020-04-20 LAB — LIPASE, BLOOD: Lipase: 25 U/L (ref 11–51)

## 2020-04-20 LAB — VITAMIN B12: Vitamin B-12: 210 pg/mL (ref 180–914)

## 2020-04-20 LAB — TSH: TSH: 2.257 u[IU]/mL (ref 0.350–4.500)

## 2020-04-20 MED ORDER — ACETAMINOPHEN 650 MG RE SUPP: 650.0000 mg | Freq: Four times a day (QID) | RECTAL | Status: DC | PRN

## 2020-04-20 MED ORDER — SODIUM CHLORIDE 0.9% FLUSH
3.0000 mL | Freq: Two times a day (BID) | INTRAVENOUS | Status: DC
  Administered 2020-04-20 – 2020-04-26 (×11): 3 mL via INTRAVENOUS

## 2020-04-20 MED ORDER — HALOPERIDOL LACTATE 5 MG/ML IJ SOLN
INTRAMUSCULAR | Status: AC
  Administered 2020-04-20: 2 mg via INTRAVENOUS
  Filled 2020-04-20: qty 1

## 2020-04-20 MED ORDER — LORAZEPAM 2 MG/ML IJ SOLN
1.0000 mg | Freq: Once | INTRAMUSCULAR | Status: AC
  Administered 2020-04-20: 1 mg via INTRAVENOUS
  Filled 2020-04-20: qty 1

## 2020-04-20 MED ORDER — ENOXAPARIN SODIUM 40 MG/0.4ML ~~LOC~~ SOLN
40.0000 mg | SUBCUTANEOUS | Status: DC
  Administered 2020-04-20 – 2020-04-25 (×5): 40 mg via SUBCUTANEOUS
  Filled 2020-04-20 (×6): qty 0.4

## 2020-04-20 MED ORDER — HALOPERIDOL LACTATE 5 MG/ML IJ SOLN: 2.0000 mg | Freq: Once | INTRAMUSCULAR | Status: AC

## 2020-04-20 MED ORDER — NICOTINE 21 MG/24HR TD PT24
21.0000 mg | MEDICATED_PATCH | Freq: Every day | TRANSDERMAL | Status: DC
  Administered 2020-04-20 – 2020-04-23 (×3): 21 mg via TRANSDERMAL
  Filled 2020-04-20 (×4): qty 1

## 2020-04-20 MED ORDER — IOHEXOL 300 MG/ML  SOLN
100.0000 mL | Freq: Once | INTRAMUSCULAR | Status: AC | PRN
  Administered 2020-04-20: 100 mL via INTRAVENOUS

## 2020-04-20 MED ORDER — ACETAMINOPHEN 325 MG PO TABS
650.0000 mg | ORAL_TABLET | Freq: Four times a day (QID) | ORAL | Status: DC | PRN
  Administered 2020-04-22: 650 mg via ORAL
  Filled 2020-04-20: qty 2

## 2020-04-20 NOTE — ED Notes (Signed)
Pt sleeping soundly. Pt heart rate range from 37-68.   Dr. Posey Pronto came in to talk to pt. Pt confused. Unaware of place or time. States he lives alone with no close family. Unaware of why in the hospital.    lw edt

## 2020-04-20 NOTE — ED Notes (Signed)
Assisted patient to the toilet and performed linen change. Dry brief applied. This tech still sitting with the patient 1:1 IVC.

## 2020-04-20 NOTE — ED Provider Notes (Signed)
-----------------------------------------   6:33 PM on 04/20/2020 -----------------------------------------  Patient's CT results are still not in the computer.  We called CT and they are going to print the results and bring them they report there are no acute problems in either the head of the belly CT.  Patient has becoming disruptive and insisting on leaving.  He is unable to care for himself at home.  He is going to be given 2 of Haldol IV in an attempt to calm him down.  I have consulted the hospitalist for medical admission.   Nena Polio, MD 04/20/20 [phone]

## 2020-04-20 NOTE — ED Notes (Signed)
Patient is now resting comfortably in bed. Monitor cables placed back on pt. x2 rails up. No other needs found at this moment. This tech remains sitting 1:1 IVC.

## 2020-04-20 NOTE — ED Notes (Signed)
Called lab for tech to come recollect bloodwork.

## 2020-04-20 NOTE — ED Notes (Addendum)
This tech is currently sitting 1:1 IVC. Pt. is very upset for not having nothing eat. Per Quentin Cornwall, MD [employer] for him to have apple sauce at this moment.

## 2020-04-20 NOTE — ED Notes (Signed)
Pt kept trying to get up from bed, pulling monitor cables off, and would not follow command. Pt very confused and "trying to go home." This tech was unable to get pt back in bed so more back up was called to help assist pt back in bed.

## 2020-04-20 NOTE — H&P (Signed)
History and Physical    Craig [name] LKG:[id number] DOB: [birth year] DOA: 04/20/2020  PCP: Craig Merles, MD  Patient coming from: Home via EMS  I have personally briefly reviewed patient's old medical records in [place]  Chief Complaint: Abnormal mentation  HPI: Craig [name] is a 79 y.o. 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  History is limited from patient due to altered mental status and is otherwise supplemented by EDP, chart review, and daughter by phone.  Presenting history is unclear.  Per ED documentation EMS were called to his residence.  He was apparently agitated but also complaining of chest and abdominal pain.  He was brought to the ED for further evaluation.  He was agitated on arrival and disoriented.  He was repetitively saying that God has given ESP per ED documentation.  He is not felt to have capacity to leave [facility] therefore he was placed under IVC.  At time of my evaluation, patient is sleeping comfortably after receiving Haldol.  He awakens easily but only intermittently answering questions.  He is not sure why he is in the hospital.  He denies any pain.  He is able to tell me his full name but is not oriented to place or time.  He is not able to tell me his daughter's name.  He says he lives alone.  He says he does smoke cigarettes, up to a pack per day.  He reports rare alcohol use.  He denies any cocaine or injection drug use.  I discussed with his daughter by phone.  She was unaware he was in the hospital.  She says that he is normally able to communicate well and complete all his ADLs.  She is not aware of any underlying psychiatric history but does state that he has a reported history of dementia.  She says he was on medication for memory (Aricept) but does not think he has been  taking this.  She says he has been taking his blood pressure medicine.  She says he last saw his primary care physician, Dr. Lennox [name], earlier this summer.  ED Course:  Initial vitals showed BP 151/61, pulse 83, RR 20, temp 97.7 Fahrenheit, SPO2 98% on room air. While in the waiting room, patient was noted to have bradycardia with heart rate as low as 35 and was brought into ED from urgent evaluation. Subsequent documented heart rate has been maintaining greater than 50 bpm. He has remained in atrial flutter.  Labs show WBC 3.4, hemoglobin 13.3, platelets 143,000, sodium 131, potassium 4.0, bicarb 25, BUN 12, creatinine 0.80, serum glucose 86, LFTs within normal limits, lipase 25, high-sensitivity troponin I 10x2, TSH 2.257. Urinalysis is negative for UTI. B12 and folate were obtained and pending.  SARS-CoV-2 PCR is negative. Influenza A/B PCR's are negative.  Portable chest x-ray showed mildly enlarged cardiac silhouette without focal consolidation, edema, or effusion.  CT abdomen/pelvis with contrast was negative for acute intra-abdominal or intrapelvic process. A stable right lobe liver hemangioma was noted.  CT head without contrast was negative for acute intracranial abnormality. Generalized atrophy and moderate evidence of chronic small vessel ischemia is noted.  Patient was given IV Ativan 1 mg once and IV Haldol 2 mg once. Patient was placed under IVC while in the ED. Case was discussed with on-call cardiology who recommended medical observation to  ensure no further bradycardia. The hospitalist service was consulted to admit for further evaluation and management.  Review of Systems:  Unable to obtain full review of systems due to mental status.  Past Medical History:  Diagnosis Date  . Atrial flutter ([place])   . Coronary artery disease   . Hyperlipidemia   . Hypertension   . TIA (transient ischemic attack)     Past Surgical History:  Procedure Laterality Date  . arm surgery Left    . HERNIA REPAIR      Social History:  reports that he has quit smoking. He smoked 0.00 packs per day. His smokeless tobacco use includes snuff. He reports current alcohol use. He reports that he does not use drugs.  Allergies  Allergen Reactions  . Penicillins     No family history on file.   Prior to Admission medications   Medication Sig Start Date End Date Taking? Authorizing Provider  donepezil (ARICEPT) 10 MG tablet TK 1 T PO HS FOR MEMORY 12/18/17  Yes [provider]  lisinopril-hydrochlorothiazide (PRINZIDE,ZESTORETIC) 20-25 MG tablet TAKE ONE TABLET BY MOUTH EVERY DAY 02/01/15  Yes [provider]  oxybutynin (DITROPAN) 5 MG tablet Take 5 mg by mouth at bedtime. 02/05/20  Yes [provider]    Physical Exam: Vitals:   04/20/20 1830 04/20/20 1845 04/20/20 1848 04/20/20 1900  BP:  (!) 165/81 (!) 165/81 (!) 161/79  Pulse:  71 62 (!) 55  Resp: 14 17 16 16   Temp:      TempSrc:      SpO2:  98% 100% 100%  Weight:      Height:       Constitutional: Resting in bed, NAD, calm, comfortable, sleeping but easily awakens and will intermittently answer questions. Eyes: PERRL, EOMI, lids and conjunctivae normal ENMT: Mucous membranes are dry. Posterior pharynx clear of any exudate or lesions.poor dentition.  Neck: normal, supple, no masses. Respiratory: clear to auscultation bilaterally, no wheezing, no crackles. Normal respiratory effort. No accessory muscle use.  Cardiovascular: Bradycardic with irregularly irregular rhythm, no murmurs / rubs / gallops. No extremity edema. 2+ pedal pulses. Abdomen: no tenderness, no masses palpated. No hepatosplenomegaly. Bowel sounds positive.  Musculoskeletal: no clubbing / cyanosis. No joint deformity upper and lower extremities. Good ROM, no contractures. Normal muscle tone.  Skin: no rashes, lesions, ulcers. No induration Neurologic: CN 2-12 grossly intact. Sensation intact, Strength 5/5 in all 4.  Psychiatric:  Sleeping on my exam, easily awakens.  He is oriented to self but not to place or time.  He is not able to tell me his daughter's name.  He is not able to tell me why he came to the hospital.  He denies seeing or hearing things which are not present.  Per ED documentation, on arrival he was repeatedly saying that God has given him ESP.  Patient more cooperative after receiving Haldol.   Labs on Admission: I have personally reviewed following labs and imaging studies  CBC: Recent Labs  Lab 04/20/20 1234  WBC 3.4*  HGB 13.3  HCT 40.2  MCV 95.9  PLT 914*   Basic Metabolic Panel: Recent Labs  Lab 04/20/20 1526  NA 131*  K 4.0  CL 97*  CO2 25  GLUCOSE 86  BUN 12  CREATININE 0.80  CALCIUM 8.7*   GFR: Estimated Creatinine Clearance: 84.9 mL/min (by C-G formula based on SCr of 0.8 mg/dL). Liver Function Tests: Recent Labs  Lab 04/20/20 1526  AST 17  ALT 15  ALKPHOS 57  BILITOT 1.0  PROT 9.0*  ALBUMIN 3.3*   Recent Labs  Lab 04/20/20 1526  LIPASE 25   No results for input(s): AMMONIA in the last 168 hours. Coagulation Profile: No results for input(s): INR, PROTIME in the last 168 hours. Cardiac Enzymes: No results for input(s): CKTOTAL, CKMB, CKMBINDEX, TROPONINI in the last 168 hours. BNP (last 3 results) No results for input(s): PROBNP in the last 8760 hours. HbA1C: No results for input(s): HGBA1C in the last 72 hours. CBG: No results for input(s): GLUCAP in the last 168 hours. Lipid Profile: No results for input(s): CHOL, HDL, LDLCALC, TRIG, CHOLHDL, LDLDIRECT in the last 72 hours. Thyroid Function Tests: Recent Labs    04/20/20 1549  TSH 2.257   Anemia Panel: No results for input(s): VITAMINB12, FOLATE, FERRITIN, TIBC, IRON, RETICCTPCT in the last 72 hours. Urine analysis:    Component Value Date/Time   COLORURINE YELLOW (A) 04/20/2020 1222   APPEARANCEUR CLEAR (A) 04/20/2020 1222   LABSPEC 1.010 04/20/2020 1222   PHURINE 6.0 04/20/2020 1222    GLUCOSEU NEGATIVE 04/20/2020 1222   HGBUR NEGATIVE 04/20/2020 1222   BILIRUBINUR NEGATIVE 04/20/2020 1222   KETONESUR NEGATIVE 04/20/2020 1222   PROTEINUR NEGATIVE 04/20/2020 1222   NITRITE NEGATIVE 04/20/2020 1222   LEUKOCYTESUR NEGATIVE 04/20/2020 1222    Radiological Exams on Admission: CT Head Wo Contrast  Result Date: 04/20/2020 CLINICAL DATA:  Mental status change. EXAM: CT HEAD WITHOUT CONTRAST TECHNIQUE: Contiguous axial images were obtained from the base of the skull through the vertex without intravenous contrast. COMPARISON:  None. FINDINGS: Brain: No acute hemorrhage. Generalized atrophy. Moderate to advanced periventricular and deep white matter hypodensity, nonspecific but typical of chronic small vessel ischemia. No evidence of acute ischemia or territorial infarct. No hydrocephalus, midline shift, or mass effect. Minimal basal gangliar mineralization is likely senescent. No subdural or extra-axial collection. Vascular: Atherosclerosis of skullbase vasculature without hyperdense vessel or abnormal calcification. Skull: No fracture or focal lesion. Sinuses/Orbits: Paranasal sinuses and mastoid air cells are clear. The visualized orbits are unremarkable. Other: None. IMPRESSION: 1. No acute intracranial abnormality. 2. Generalized atrophy and moderate to advanced chronic small vessel ischemia. Electronically Signed   By: Keith Rake M.D.   On: 04/20/2020 16:30   CT ABDOMEN PELVIS W CONTRAST  Result Date: 04/20/2020 CLINICAL DATA:  Abdominal pain, nausea and vomiting, bradycardia EXAM: CT ABDOMEN AND PELVIS WITH CONTRAST TECHNIQUE: Multidetector CT imaging of the abdomen and pelvis was performed using the standard protocol following bolus administration of intravenous contrast. CONTRAST:  113mL OMNIPAQUE IOHEXOL 300 MG/ML  SOLN COMPARISON:  10/31/2017 FINDINGS: Lower chest: Heart is enlarged without pericardial effusion. No acute pleural or parenchymal lung disease. Hepatobiliary:  Stable hypodensity right lobe liver measuring 2.3 cm, demonstrating delayed centripetal enhancement consistent with hemangioma. No other focal liver abnormalities. Gallbladder is unremarkable. No biliary dilation. Pancreas: Unremarkable. No pancreatic ductal dilatation or surrounding inflammatory changes. Spleen: Normal in size without focal abnormality. Adrenals/Urinary Tract: Mild bilateral renal cortical thinning. Otherwise the kidneys enhance normally and symmetrically. Adrenals are grossly normal. Bladder is decompressed, limiting evaluation. Stomach/Bowel: No bowel obstruction or ileus. No bowel wall thickening or inflammatory change. Vascular/Lymphatic: Aortic atherosclerosis. No enlarged abdominal or pelvic lymph nodes. Reproductive: Radiotherapy seeds are seen within the prostate. Other: No free fluid or free gas. No abdominal wall hernia. Musculoskeletal: No acute or destructive bony lesions. Prominent spondylosis and facet hypertrophy at the lumbosacral junction. Reconstructed images demonstrate no additional findings. IMPRESSION: 1. No acute intra-abdominal or  intrapelvic process. 2. Stable right lobe liver hemangioma. 3. Aortic Atherosclerosis (ICD10-I70.0). Electronically Signed   By: Randa Ngo M.D.   On: 04/20/2020 16:32   DG Chest Portable 1 View  Result Date: 04/20/2020 CLINICAL DATA:  Right lower abdominal pain, altered level of consciousness, previous tobacco abuse EXAM: PORTABLE CHEST 1 VIEW COMPARISON:  09/08/2017 FINDINGS: 2 frontal views of the chest demonstrate mild enlargement the cardiac silhouette, likely exacerbated by portable AP technique. No airspace disease, effusion, or pneumothorax. No acute bony abnormalities. IMPRESSION: 1. Mild enlargement of the cardiac silhouette. 2. No acute intrathoracic process. Electronically Signed   By: Randa Ngo M.D.   On: 04/20/2020 15:36    EKG: Personally reviewed. Atrial flutter with variable AV block, rate 35 bpm.  Rate is slower  when compared to previous EKGs.  Assessment/Plan Principal Problem:   Altered mental status Active Problems:   CAD (coronary artery disease), native coronary artery   Hyperlipidemia   Essential hypertension   Memory loss   Atrial flutter ([place])   Bradycardia  Craig [name] is a 79 y.o. 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  Altered mental status/cognitive impairment/?psychosis: Patient presented agitated and per ED documentation was piquantly stating that God gave him ESP.  He was placed under IVC in the ED.  He was given IV Ativan and Haldol with improvement in agitation.  Initial work-up without obvious etiology for reported acute mental status change.  On review of previous records, he did state to his cardiologist that he thinks he has ESPN can see things coming in the future per outpatient cardiology note 10/16/2016.  Daughter states that he was on Aricept for suspected dementia but has not been taking it.  Does not seem that he has had formal psychiatry evaluation. -No significant lab abnormality to explain presentation -TSH within normal limits, follow B12 -CT head and CT abdomen/pelvis without acute etiology -Does not appear to be symptomatic from transient bradycardia -Anticipate he needs formal psychiatry evaluation to determine disposition if remains medically stable overnight -Placed under IVC in the ED  Atrial flutter with bradycardia: Seen by cardiology, Dr. Rockey Situ and Dr. Clayborn Bigness previously.  Was on Xarelto in the past but does not appear to have been taking it recently.  Patient had transient bradycardia down to 35 while in the ED.  Heart rate seems to be maintaining in the 50-60s now.  He is not on any rate or rhythm controlling medications as an outpatient.  Per prior cardiology documentation he refused EP ablation in the past.  EDP  discussed with on-call cardiology who recommended observation on telemetry. 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  No official documentation available.  He is not on aspirin or statin therapy.  Currently denies any chest pain.  Troponin is negative x2.  Hypertension: Initially hypertensive, BP normalized now.  Resume home lisinopril-HCTZ if needed.  Hyperlipidemia: Not currently on statin therapy.  Tobacco use: Reports smoking 1 pack/day.  Nicotine patch ordered.  DVT prophylaxis: Lovenox Code Status: Full code Family Communication: Discussed with patient's daughter by phone Disposition Plan: From home, dispo pending stable heart rate, may need formal psych eval if mentation does not improve  towards baseline overnight. Consults called: EDP discussed with on-call cardiology Admission status:  Status is: Observation  The patient remains OBS appropriate and will d/c before 2 midnights.  Dispo: The patient is from: Home              Anticipated d/c is to: TBD pending improvement in mentation.  Currently admitted under IVC status.              Anticipated d/c date is: 1 day              Patient currently is medically stable to d/c.  Zada Finders MD [facility]  If 7PM-7AM, please contact night-coverage [email or web address]  04/20/2020, 8:06 PM

## 2020-04-20 NOTE — ED Notes (Signed)
PT  PLACED  UNDER  IVC  INFORMED  RUSSELL  RN  AND  HEATHER  CHARGE  NURSE

## 2020-04-20 NOTE — ED Notes (Signed)
Pt resting at this time.  HR 38-61 Rate 57-66 Labored breathing at this time lw edt

## 2020-04-20 NOTE — ED Provider Notes (Signed)
-----------------------------------------   5:11 PM on 04/20/2020 -----------------------------------------  Patient CTs have been done were still waiting for results however.   Nena Polio, MD 04/20/20 [phone]

## 2020-04-20 NOTE — ED Notes (Signed)
Per sitter patient is becoming agitated and is pulling off cardiac leads. Sent message to provider for PRN medication.

## 2020-04-20 NOTE — ED Provider Notes (Signed)
-----------------------------------------   6:59 PM on 04/20/2020 -----------------------------------------  Discussed patient with hospitalist who feels cardiology would be unlikely to put a pacer in or do anything with him.  I therefore discussed the patient with Dr. Nehemiah Massed on-call for cardiology who feels the patient should be observed medically until we are sure he is stable.  Additionally Dr. Nehemiah Massed agrees with me that psychiatry will not admit this gentleman with his heart rate dipping down into the 30s.  I will recontact hospitalist.   Nena Polio, MD 04/20/20 1859

## 2020-04-20 NOTE — ED Notes (Signed)
This tech sitting with patient 1:1 IVC. Pt resting in bed. HR high 30s to 50s at this time.

## 2020-04-20 NOTE — ED Notes (Signed)
Lab came to straight stick patient for remaining bloodwork. Patient was unavailable. Lab tech advised that they would return shortly to recollect bloodwork.

## 2020-04-20 NOTE — ED Provider Notes (Signed)
[facility] Emergency Department Provider Note    First MD Initiated Contact with Patient 04/20/20 1236     (approximate)  I have reviewed the triage vital signs and the nursing notes.   HISTORY  Chief Complaint Abdominal Pain    HPI Craig [name] is a 79 y.o. male the history of atrial flutter presents via EMS.  My evaluation patient not able to provide any clear history as to why he was brought to the ER.  Very poor historian.  Triage note reports abdominal pain nausea vomiting but he was rushed back due to low heart rate.  Patient with heart rate on telemetry showing rates as low as 35 but majority shows a flutter 48-50.  He is denying any chest pain or pressure.    Past Medical History:  Diagnosis Date  . Atrial flutter ([place])   . Coronary artery disease   . Hyperlipidemia   . Hypertension   . TIA (transient ischemic attack)    No family history on file. Past Surgical History:  Procedure Laterality Date  . arm surgery Left   . HERNIA REPAIR     Patient Active Problem List   Diagnosis Date Noted  . CAD (coronary artery disease), native coronary artery 12/25/2017  . History of coronary artery stent placement 12/25/2017  . Hyperlipidemia 12/25/2017  . Former smoker 12/25/2017  . Essential hypertension 12/25/2017  . Memory loss 12/25/2017      Prior to Admission medications   Medication Sig Start Date End Date Taking? Authorizing Provider  donepezil (ARICEPT) 10 MG tablet TK 1 T PO HS FOR MEMORY 12/18/17  Yes [provider]  lisinopril-hydrochlorothiazide (PRINZIDE,ZESTORETIC) 20-25 MG tablet TAKE ONE TABLET BY MOUTH EVERY DAY 02/01/15  Yes [provider]  oxybutynin (DITROPAN) 5 MG tablet Take 5 mg by mouth at bedtime. 02/05/20  Yes [provider]    Allergies Penicillins    Social History Social History   Tobacco Use  . Smoking status: Former Smoker    Packs/day: 0.00  . Smokeless tobacco:  Current User    Types: Snuff  Substance Use Topics  . Alcohol use: Yes  . Drug use: No    Review of Systems Patient denies headaches, rhinorrhea, blurry vision, numbness, shortness of breath, chest pain, edema, cough, abdominal pain, nausea, vomiting, diarrhea, dysuria, fevers, rashes or hallucinations unless otherwise stated above in HPI. ____________________________________________   PHYSICAL EXAM:  VITAL SIGNS: Vitals:   04/20/20 1500 04/20/20 1502  BP: (!) 163/92 (!) 163/91  Pulse: (!) 43 (!) 55  Resp: 14   Temp:  98.2 F (36.8 C)  SpO2: 100% 99%    Constitutional: Alert and oriented.  Eyes: Conjunctivae are normal.  Head: Atraumatic. Nose: No congestion/rhinnorhea. Mouth/Throat: Mucous membranes are moist.   Neck: No stridor. Painless ROM.  Cardiovascular: slow irregular rhythm. Grossly normal heart sounds.  Good peripheral circulation. Respiratory: Normal respiratory effort.  No retractions. Lungs CTAB. Gastrointestinal: Soft and nontender. No distention. No abdominal bruits. No CVA tenderness. Genitourinary:  Musculoskeletal: No lower extremity tenderness nor edema.  No joint effusions. Neurologic:  Normal speech and language. No gross focal neurologic deficits are appreciated. No facial droop Skin:  Skin is warm, dry and intact. No rash noted. Psychiatric:calm and cooperative  ____________________________________________   LABS (all labs ordered are listed, but only abnormal results are displayed)  Results for orders placed or performed during the hospital encounter of 04/20/20 (from the past 24 hour(s))  Urinalysis, Complete w Microscopic  Status: Abnormal   Collection Time: 04/20/20 12:22 PM  Result Value Ref Range   Color, Urine YELLOW (A) YELLOW   APPearance CLEAR (A) CLEAR   Specific Gravity, Urine 1.010 1.005 - 1.030   pH 6.0 5.0 - 8.0   Glucose, UA NEGATIVE NEGATIVE mg/dL   Hgb urine dipstick NEGATIVE NEGATIVE   Bilirubin Urine NEGATIVE  NEGATIVE   Ketones, ur NEGATIVE NEGATIVE mg/dL   Protein, ur NEGATIVE NEGATIVE mg/dL   Nitrite NEGATIVE NEGATIVE   Leukocytes,Ua NEGATIVE NEGATIVE   RBC / HPF 0-5 0 - 5 RBC/hpf   WBC, UA NONE SEEN 0 - 5 WBC/hpf   Bacteria, UA NONE SEEN NONE SEEN   Squamous Epithelial / LPF NONE SEEN 0 - 5   Mucus PRESENT   CBC     Status: Abnormal   Collection Time: 04/20/20 12:34 PM  Result Value Ref Range   WBC 3.4 (L) 4.0 - 10.5 K/uL   RBC 4.19 (L) 4.22 - 5.81 MIL/uL   Hemoglobin 13.3 13.0 - 17.0 g/dL   HCT 40.2 39 - 52 %   MCV 95.9 80.0 - 100.0 fL   MCH 31.7 26.0 - 34.0 pg   MCHC 33.1 30.0 - 36.0 g/dL   RDW 11.8 11.5 - 15.5 %   Platelets 143 (L) 150 - 400 K/uL   nRBC 0.0 0.0 - 0.2 %  Troponin I (High Sensitivity)     Status: None   Collection Time: 04/20/20 12:34 PM  Result Value Ref Range   Troponin I (High Sensitivity) 10 <18 ng/L   ____________________________________________  EKG My review and personal interpretation at Time: 12:16   Indication: bradycardia  Rate: 35  Rhythm: aflutter Axis: normal Other: normal intervals, no stemi ____________________________________________  RADIOLOGY  I personally reviewed all radiographic images ordered to evaluate for the above acute complaints and reviewed radiology reports and findings.  These findings were personally discussed with the patient.  Please see medical record for radiology report.  ____________________________________________   PROCEDURES  Procedure(s) performed:  Procedures    Critical Care performed: no ____________________________________________   INITIAL IMPRESSION / ASSESSMENT AND PLAN / ED COURSE  Pertinent labs & imaging results that were available during my care of the patient were reviewed by me and considered in my medical decision making (see chart for details).   DDX: Dysrhythmia, electrolyte abnormality, dehydration, ACS  Craig [name] is a 79 y.o. who presents to the ED with presentation as  described above.  Patient very poor historian.  Noted be mildly bradycardic but does not seem to be significantly symptomatic from this.  Blood will be sent for by differential.  There was some report by EMS that he was having some chest discomfort as well as abdominal pain will add on CT as his exam history and presentation is limited.  Clinical Course as of Apr 20 1510  Tue Apr 20, 2020  1430 Patient becoming agitated and stating that he wants to leave.  Patient is disoriented to time seems very repetitive stating that God has given him ESP.  He is refusing additional work-up.  He is denying any pain at this time though reports that EMS was called out for abdominal pain but then there is also report of chest pain.  His bradycardia seems to have resolved but still cannot rule out electrolyte abnormality.  No family listed.  I do not think that he has capacity to leave [place] as he does seem very confused   [PR]  66 Was able to contact DSS of as well [place] who states that they did go out to see patient today.  Have followed up on him a month ago, stated today noted significant confusion was reporting some chest discomfort.  DSS called EMS and open the case.  Does have previous prescription for Aricept therefore I do suspect worsening dementia but patient lives at home alone currently.  I am going to place under IVC for medical evaluation particularly with his age risk factors and acute confusion.  He does not have the capacity to leave [place] at this time.  Patient be signed out to oncoming physician pending blood work and reassessment.  May require hospitalization for medical workup of his confusion.   [PR]    Clinical Course User Index [PR] Merlyn Lot, MD    The patient was evaluated in Emergency Department today for the symptoms described in the history of present illness. He/she was evaluated in the context of the global COVID-19 pandemic, which necessitated  consideration that the patient might be at risk for infection with the SARS-CoV-2 virus that causes COVID-19. Institutional protocols and algorithms that pertain to the evaluation of patients at risk for COVID-19 are in a state of rapid change based on information released by regulatory bodies including the CDC and federal and state organizations. These policies and algorithms were followed during the patient's care in the ED.  As part of my medical decision making, I reviewed the following data within the [place] notes reviewed and incorporated, Labs reviewed, notes from prior ED visits and  Controlled Substance Database   ____________________________________________   FINAL CLINICAL IMPRESSION(S) / ED DIAGNOSES  Final diagnoses:  Altered mental status, unspecified altered mental status type  Atrial flutter, unspecified type ([place])  Bradycardia      NEW MEDICATIONS STARTED DURING THIS VISIT:  New Prescriptions   No medications on file     Note:  This document was prepared using Dragon voice recognition software and may include unintentional dictation errors.    Merlyn Lot, MD 04/20/20 1511

## 2020-04-20 NOTE — ED Notes (Signed)
Patient refuses redraw of blood. Refuses to state why he is here. States that EMS brought him here because they want his money.

## 2020-04-20 NOTE — ED Notes (Signed)
Lab drew labs needed.  Pt resting comfortably at this time   lw edt

## 2020-04-20 NOTE — ED Triage Notes (Signed)
Patient to ER for c/o right sided lower abd pain. Denies any N/V/D or fevers.

## 2020-04-21 DIAGNOSIS — F17210 Nicotine dependence, cigarettes, uncomplicated: Secondary | ICD-10-CM | POA: Diagnosis present

## 2020-04-21 DIAGNOSIS — R4182 Altered mental status, unspecified: Secondary | ICD-10-CM | POA: Diagnosis not present

## 2020-04-21 DIAGNOSIS — E871 Hypo-osmolality and hyponatremia: Secondary | ICD-10-CM | POA: Diagnosis not present

## 2020-04-21 DIAGNOSIS — F0391 Unspecified dementia with behavioral disturbance: Secondary | ICD-10-CM | POA: Diagnosis present

## 2020-04-21 DIAGNOSIS — I4891 Unspecified atrial fibrillation: Secondary | ICD-10-CM | POA: Diagnosis present

## 2020-04-21 DIAGNOSIS — R001 Bradycardia, unspecified: Secondary | ICD-10-CM | POA: Diagnosis present

## 2020-04-21 DIAGNOSIS — Z79899 Other long term (current) drug therapy: Secondary | ICD-10-CM | POA: Diagnosis not present

## 2020-04-21 DIAGNOSIS — I2510 Atherosclerotic heart disease of native coronary artery without angina pectoris: Secondary | ICD-10-CM | POA: Diagnosis present

## 2020-04-21 DIAGNOSIS — I25118 Atherosclerotic heart disease of native coronary artery with other forms of angina pectoris: Secondary | ICD-10-CM | POA: Diagnosis not present

## 2020-04-21 DIAGNOSIS — I4892 Unspecified atrial flutter: Secondary | ICD-10-CM | POA: Diagnosis present

## 2020-04-21 DIAGNOSIS — E43 Unspecified severe protein-calorie malnutrition: Secondary | ICD-10-CM | POA: Diagnosis present

## 2020-04-21 DIAGNOSIS — E782 Mixed hyperlipidemia: Secondary | ICD-10-CM

## 2020-04-21 DIAGNOSIS — F03918 Unspecified dementia, unspecified severity, with other behavioral disturbance: Secondary | ICD-10-CM

## 2020-04-21 DIAGNOSIS — Z880 Allergy status to penicillin: Secondary | ICD-10-CM | POA: Diagnosis not present

## 2020-04-21 DIAGNOSIS — Z955 Presence of coronary angioplasty implant and graft: Secondary | ICD-10-CM | POA: Diagnosis not present

## 2020-04-21 DIAGNOSIS — Z602 Problems related to living alone: Secondary | ICD-10-CM | POA: Diagnosis present

## 2020-04-21 DIAGNOSIS — Z20822 Contact with and (suspected) exposure to covid-19: Secondary | ICD-10-CM | POA: Diagnosis present

## 2020-04-21 DIAGNOSIS — Z8673 Personal history of transient ischemic attack (TIA), and cerebral infarction without residual deficits: Secondary | ICD-10-CM | POA: Diagnosis not present

## 2020-04-21 DIAGNOSIS — R54 Age-related physical debility: Secondary | ICD-10-CM | POA: Diagnosis present

## 2020-04-21 DIAGNOSIS — R413 Other amnesia: Secondary | ICD-10-CM

## 2020-04-21 DIAGNOSIS — F17290 Nicotine dependence, other tobacco product, uncomplicated: Secondary | ICD-10-CM | POA: Diagnosis present

## 2020-04-21 DIAGNOSIS — F0390 Unspecified dementia without behavioral disturbance: Secondary | ICD-10-CM

## 2020-04-21 DIAGNOSIS — E785 Hyperlipidemia, unspecified: Secondary | ICD-10-CM | POA: Diagnosis present

## 2020-04-21 DIAGNOSIS — G301 Alzheimer's disease with late onset: Secondary | ICD-10-CM | POA: Diagnosis not present

## 2020-04-21 DIAGNOSIS — I252 Old myocardial infarction: Secondary | ICD-10-CM | POA: Diagnosis not present

## 2020-04-21 DIAGNOSIS — F0280 Dementia in other diseases classified elsewhere without behavioral disturbance: Secondary | ICD-10-CM | POA: Diagnosis not present

## 2020-04-21 DIAGNOSIS — I119 Hypertensive heart disease without heart failure: Secondary | ICD-10-CM | POA: Diagnosis present

## 2020-04-21 LAB — CBC
HCT: 36.7 % — ABNORMAL LOW (ref 39.0–52.0)
Hemoglobin: 12.4 g/dL — ABNORMAL LOW (ref 13.0–17.0)
MCH: 31.6 pg (ref 26.0–34.0)
MCHC: 33.8 g/dL (ref 30.0–36.0)
MCV: 93.4 fL (ref 80.0–100.0)
Platelets: 142 10*3/uL — ABNORMAL LOW (ref 150–400)
RBC: 3.93 MIL/uL — ABNORMAL LOW (ref 4.22–5.81)
RDW: 11.5 % (ref 11.5–15.5)
WBC: 2.9 10*3/uL — ABNORMAL LOW (ref 4.0–10.5)
nRBC: 0 % (ref 0.0–0.2)

## 2020-04-21 LAB — BASIC METABOLIC PANEL
Anion gap: 7 (ref 5–15)
BUN: 11 mg/dL (ref 8–23)
CO2: 25 mmol/L (ref 22–32)
Calcium: 8.7 mg/dL — ABNORMAL LOW (ref 8.9–10.3)
Chloride: 99 mmol/L (ref 98–111)
Creatinine, Ser: 0.88 mg/dL (ref 0.61–1.24)
GFR, Estimated: >60 mL/min (ref >60)
Glucose, Bld: 78 mg/dL (ref 70–99)
Potassium: 3.6 mmol/L (ref 3.5–5.1)
Sodium: 131 mmol/L — ABNORMAL LOW (ref 135–145)

## 2020-04-21 LAB — ETHANOL: Alcohol, Ethyl (B): <10 mg/dL (ref <10)

## 2020-04-21 MED ORDER — LISINOPRIL 20 MG PO TABS
20.0000 mg | ORAL_TABLET | Freq: Every day | ORAL | Status: DC
  Administered 2020-04-22 – 2020-04-26 (×4): 20 mg via ORAL
  Filled 2020-04-21 (×4): qty 1

## 2020-04-21 MED ORDER — MELATONIN 5 MG PO TABS
5.0000 mg | ORAL_TABLET | Freq: Every day | ORAL | Status: DC
  Administered 2020-04-21 – 2020-04-25 (×4): 5 mg via ORAL
  Filled 2020-04-21 (×5): qty 1

## 2020-04-21 MED ORDER — DONEPEZIL HCL 5 MG PO TABS
10.0000 mg | ORAL_TABLET | Freq: Every day | ORAL | Status: DC
  Administered 2020-04-21 – 2020-04-25 (×4): 10 mg via ORAL
  Filled 2020-04-21 (×6): qty 2

## 2020-04-21 MED ORDER — HALOPERIDOL LACTATE 5 MG/ML IJ SOLN: 2.5000 mg | Freq: Four times a day (QID) | INTRAMUSCULAR | Status: DC | PRN

## 2020-04-21 MED ORDER — LISINOPRIL-HYDROCHLOROTHIAZIDE 20-25 MG PO TABS: 1.0000 | ORAL_TABLET | Freq: Every day | ORAL | Status: DC

## 2020-04-21 MED ORDER — HYDROCHLOROTHIAZIDE 25 MG PO TABS
25.0000 mg | ORAL_TABLET | Freq: Every day | ORAL | Status: DC
  Administered 2020-04-22 – 2020-04-26 (×4): 25 mg via ORAL
  Filled 2020-04-21 (×4): qty 1

## 2020-04-21 MED ORDER — CYANOCOBALAMIN 1000 MCG/ML IJ SOLN
1000.0000 ug | Freq: Once | INTRAMUSCULAR | Status: AC
  Administered 2020-04-21: 1000 ug via INTRAMUSCULAR
  Filled 2020-04-21: qty 1

## 2020-04-21 NOTE — Progress Notes (Signed)
Patient confused at to where he is and why he's here.  CONTINUALLY taking his telemetry unit off, hiding it underneath the bed and in the cabinets; unable to reason with him. CCMD called back/forth for telemetry not being transmitted, acknowledged. Barbaraann Faster, RN 10:27 PM; 04/21/2020

## 2020-04-21 NOTE — ED Notes (Signed)
0500 repeat labs drawn, 1:1 sitter continues at bedside for IVC.  Pt is calm and cooperative.

## 2020-04-21 NOTE — Progress Notes (Signed)
New order for Melatonin; patient refusing telemetry and hiding box, unable to find at this time; waiting for medication from pharmacy. Barbaraann Faster, RN 10:43 PM 04/21/2020

## 2020-04-21 NOTE — ED Notes (Signed)
Pt given apple juice and water

## 2020-04-21 NOTE — Consult Note (Signed)
[facility] Face-to-Face Psychiatry Consult   Reason for Consult: Consult for this 79 year old man who presented to the emergency room yesterday with complaints of abdominal pain.  Concerned about dementia. Referring Physician: Reesa Chew Patient Identification: Craig [name] MRN:  [id number] Principal Diagnosis: Dementia [facility]) Diagnosis:  Principal Problem:   Dementia ([place]) Active Problems:   CAD (coronary artery disease), native coronary artery   Hyperlipidemia   Essential hypertension   Memory loss   Altered mental status   Atrial flutter (HCC)   Bradycardia   Total Time spent with patient: 1 hour  Subjective:   Craig [name] is a 79 y.o. male patient admitted with "I do not really know".  HPI: Patient seen chart reviewed.  This 79 year old man presented to the emergency room yesterday afternoon.  Presenting complaint is described as being right sided abdominal or flank pain.  Patient was then noticed to have bradycardia which change the focus of concern.  He became agitated at 1 point and tried to leave [place] and was placed under involuntary commitment.  Currently he has been admitted to the inpatient unit and is cooperative right now.  Patient was not able to tell me where he was right now.  Did not know that he was in the hospital.  He did know that he was in [place] and was able to remember the correct month.  He was able to tell me his address but had no clear memory of how he got to the hospital.  Did not know why he was at the hospital.  On review of symptoms he admitted to having mild pain in his right flank but said it was nothing out of the ordinary.  He denied any mood symptoms.  Denied any hallucinations denied suicidal thought.  Patient was quite pleasant during the interview although it was clear he had memory impairments.  He could not remember 3 words after a few minutes.  He did however remember the names of some of the medicines that he is prescribed  and remembered some of his medical issues.  He was not insisting or talking about discharge and was generally cooperative with treatment.  Evidently concern had been raised on admission because the patient told someone that he had "ESP".  I asked him about this and he told me that what he meant by it was that he was a good judge of people and that he could get to know people and tell what they were thinking pretty easily.  Did not appear to be having any delusional content.  Past Psychiatric History: No known past psychiatric history.  I asked him if he had ever seen a mental health provider in the past and he vaguely said he thought that maybe he had but he did not know any details of it so I am not sure what to make of it.  There is no mention that in any of his old records.  He was prescribed Aricept at some point suggesting somebody may have seen that he had memory problems.  No history of suicide attempts or violence or psychosis  Risk to Self:   Risk to Others:   Prior Inpatient Therapy:   Prior Outpatient Therapy:    Past Medical History:  Past Medical History:  Diagnosis Date  . Atrial flutter ([place])   . Coronary artery disease   . Hyperlipidemia   . Hypertension   . TIA (transient ischemic attack)     Past Surgical History:  Procedure Laterality  Date  . arm surgery Left   . HERNIA REPAIR     Family History: No family history on file. Family Psychiatric  History: Denies any Social History:  Social History   Substance and Sexual Activity  Alcohol Use Yes     Social History   Substance and Sexual Activity  Drug Use No    Social History   Socioeconomic History  . Marital status: Divorced    Spouse name: Not on file  . Number of children: Not on file  . Years of education: Not on file  . Highest education level: Not on file  Occupational History  . Not on file  Tobacco Use  . Smoking status: Former Smoker    Packs/day: 0.00  . Smokeless tobacco: Current User     Types: Snuff  Substance and Sexual Activity  . Alcohol use: Yes  . Drug use: No  . Sexual activity: Not on file  Other Topics Concern  . Not on file  Social History Narrative  . Not on file   Social Determinants of Health   Financial Resource Strain:   . Difficulty of Paying Living Expenses: Not on file  Food Insecurity:   . Worried About Charity fundraiser in the Last Year: Not on file  . Ran Out of Food in the Last Year: Not on file  Transportation Needs:   . Lack of Transportation (Medical): Not on file  . Lack of Transportation (Non-Medical): Not on file  Physical Activity:   . Days of Exercise per Week: Not on file  . Minutes of Exercise per Session: Not on file  Stress:   . Feeling of Stress : Not on file  Social Connections:   . Frequency of Communication with Friends and Family: Not on file  . Frequency of Social Gatherings with Friends and Family: Not on file  . Attends Religious Services: Not on file  . Active Member of Clubs or Organizations: Not on file  . Attends Archivist Meetings: Not on file  . Marital Status: Not on file   Additional Social History:    Allergies:   Allergies  Allergen Reactions  . Penicillins     Labs:  Results for orders placed or performed during the hospital encounter of 04/20/20 (from the past 48 hour(s))  Urinalysis, Complete w Microscopic     Status: Abnormal   Collection Time: 04/20/20 12:22 PM  Result Value Ref Range   Color, Urine YELLOW (A) YELLOW   APPearance CLEAR (A) CLEAR   Specific Gravity, Urine 1.010 1.005 - 1.030   pH 6.0 5.0 - 8.0   Glucose, UA NEGATIVE NEGATIVE mg/dL   Hgb urine dipstick NEGATIVE NEGATIVE   Bilirubin Urine NEGATIVE NEGATIVE   Ketones, ur NEGATIVE NEGATIVE mg/dL   Protein, ur NEGATIVE NEGATIVE mg/dL   Nitrite NEGATIVE NEGATIVE   Leukocytes,Ua NEGATIVE NEGATIVE   RBC / HPF 0-5 0 - 5 RBC/hpf   WBC, UA NONE SEEN 0 - 5 WBC/hpf   Bacteria, UA NONE SEEN NONE SEEN   Squamous  Epithelial / LPF NONE SEEN 0 - 5   Mucus PRESENT     Comment: Performed at [facility], [address] [place], [place] [address]  CBC     Status: Abnormal   Collection Time: 04/20/20 12:34 PM  Result Value Ref Range   WBC 3.4 (L) 4.0 - 10.5 K/uL   RBC 4.19 (L) 4.22 - 5.81 MIL/uL   Hemoglobin 13.3 13.0 - 17.0 g/dL  HCT 40.2 39 - 52 %   MCV 95.9 80.0 - 100.0 fL   MCH 31.7 26.0 - 34.0 pg   MCHC 33.1 30.0 - 36.0 g/dL   RDW 11.8 11.5 - 15.5 %   Platelets 143 (L) 150 - 400 K/uL   nRBC 0.0 0.0 - 0.2 %    Comment: Performed at [facility], [address], [place], [place] [address]  Troponin I (High Sensitivity)     Status: None   Collection Time: 04/20/20 12:34 PM  Result Value Ref Range   Troponin I (High Sensitivity) 10 <18 ng/L    Comment: (NOTE) Elevated high sensitivity troponin I (hsTnI) values and significant  changes across serial measurements may suggest ACS but many other  chronic and acute conditions are known to elevate hsTnI results.  Refer to the "Links" section for chest pain algorithms and additional  guidance. Performed at [facility], [address] [place], [place] [address]   Urine Drug Screen, Qualitative [facility] only)     Status: None   Collection Time: 04/20/20  1:33 PM  Result Value Ref Range   Tricyclic, Ur Screen NONE DETECTED NONE DETECTED   Amphetamines, Ur Screen NONE DETECTED NONE DETECTED   MDMA (Ecstasy)Ur Screen NONE DETECTED NONE DETECTED   Cocaine Metabolite,Ur [place] NONE DETECTED NONE DETECTED   Opiate, Ur Screen NONE DETECTED NONE DETECTED   Phencyclidine (PCP) Ur S NONE DETECTED NONE DETECTED   Cannabinoid 50 Ng, Ur [place] NONE DETECTED NONE DETECTED   Barbiturates, Ur Screen NONE DETECTED NONE DETECTED   Benzodiazepine, Ur Scrn NONE DETECTED NONE DETECTED   Methadone Scn, Ur NONE DETECTED NONE DETECTED    Comment: (NOTE) Tricyclics + metabolites, urine    Cutoff 1000 ng/mL Amphetamines + metabolites, urine   Cutoff 1000 ng/mL MDMA (Ecstasy), urine              Cutoff 500 ng/mL Cocaine Metabolite, urine          Cutoff 300 ng/mL Opiate + metabolites, urine        Cutoff 300 ng/mL Phencyclidine (PCP), urine         Cutoff 25 ng/mL Cannabinoid, urine                 Cutoff 50 ng/mL Barbiturates + metabolites, urine  Cutoff 200 ng/mL Benzodiazepine, urine              Cutoff 200 ng/mL Methadone, urine                   Cutoff 300 ng/mL  The urine drug screen provides only a preliminary, unconfirmed analytical test result and should not be used for non-medical purposes. Clinical consideration and professional judgment should be applied to any positive drug screen result due to possible interfering substances. A more specific alternate chemical method must be used in order to obtain a confirmed analytical result. Gas chromatography / mass spectrometry (GC/MS) is the preferred confirm atory method. Performed at [facility], [address] [place], [place] [address]   Comprehensive metabolic panel     Status: Abnormal   Collection Time: 04/20/20  3:26 PM  Result Value Ref Range   Sodium 131 (L) 135 - 145 mmol/L   Potassium 4.0 3.5 - 5.1 mmol/L   Chloride 97 (L) 98 - 111 mmol/L   CO2 25 22 - 32 mmol/L   Glucose, Bld 86 70 - 99 mg/dL    Comment: Glucose reference range applies only to samples  taken after fasting for at least 8 hours.   BUN 12 8 - 23 mg/dL   Creatinine, Ser 0.80 0.61 - 1.24 mg/dL   Calcium 8.7 (L) 8.9 - 10.3 mg/dL   Total Protein 9.0 (H) 6.5 - 8.1 g/dL   Albumin 3.3 (L) 3.5 - 5.0 g/dL   AST 17 15 - 41 U/L   ALT 15 0 - 44 U/L   Alkaline Phosphatase 57 38 - 126 U/L   Total Bilirubin 1.0 0.3 - 1.2 mg/dL   GFR, Estimated >60 >60 mL/min    Comment: (NOTE) Calculated using the CKD-EPI Creatinine Equation (2021)    Anion gap 9 5 - 15    Comment: Performed at [facility], [address] [place], [place] [address]  Lipase, blood     Status: None    Collection Time: 04/20/20  3:26 PM  Result Value Ref Range   Lipase 25 11 - 51 U/L    Comment: Performed at [facility], [address], [place], [place] [address]  Troponin I (High Sensitivity)     Status: None   Collection Time: 04/20/20  3:26 PM  Result Value Ref Range   Troponin I (High Sensitivity) 10 <18 ng/L    Comment: (NOTE) Elevated high sensitivity troponin I (hsTnI) values and significant  changes across serial measurements may suggest ACS but many other  chronic and acute conditions are known to elevate hsTnI results.  Refer to the "Links" section for chest pain algorithms and additional  guidance. Performed at [facility], [address] [place], [place] [address]   TSH     Status: None   Collection Time: 04/20/20  3:49 PM  Result Value Ref Range   TSH 2.257 0.350 - 4.500 uIU/mL    Comment: Performed by a 3rd Generation assay with a functional sensitivity of <=0.01 uIU/mL. Performed at [facility], [address] [place], [place] [address]   Vitamin B12     Status: None   Collection Time: 04/20/20  3:49 PM  Result Value Ref Range   Vitamin B-12 210 180 - 914 pg/mL    Comment: (NOTE) This assay is not validated for testing neonatal or myeloproliferative syndrome specimens for Vitamin B12 levels. Performed at [facility], [place] [address], [place],  [address]   Respiratory Panel by RT PCR (Flu A&B, Covid) - Nasopharyngeal Swab     Status: None   Collection Time: 04/20/20  4:41 PM   Specimen: Nasopharyngeal Swab  Result Value Ref Range   SARS Coronavirus 2 by RT PCR NEGATIVE NEGATIVE    Comment: (NOTE) SARS-CoV-2 target nucleic acids are NOT DETECTED.  The SARS-CoV-2 RNA is generally detectable in upper respiratoy specimens during the acute phase of infection. The lowest concentration of SARS-CoV-2 viral copies this assay can detect is 131 copies/mL. A negative result does not preclude SARS-Cov-2 infection and  should not be used as the sole basis for treatment or other patient management decisions. A negative result may occur with  improper specimen collection/handling, submission of specimen other than nasopharyngeal swab, presence of viral mutation(s) within the areas targeted by this assay, and inadequate number of viral copies (<131 copies/mL). A negative result must be combined with clinical observations, patient history, and epidemiological information. The expected result is Negative.  Fact Sheet for Patients:  PinkCheek.be  Fact Sheet for Healthcare Providers:  GravelBags.it  This test is no t yet approved or cleared by the [place] and  has been authorized for detection and/or diagnosis of SARS-CoV-2 by FDA under an Emergency Use Authorization (EUA). This EUA will remain  in effect (meaning this test can be used) for the duration of the COVID-19 declaration under Section 564(b)(1) of the Act, 21 U.S.C. section 360bbb-3(b)(1), unless the authorization is terminated or revoked sooner.     Influenza A by PCR NEGATIVE NEGATIVE   Influenza B by PCR NEGATIVE NEGATIVE    Comment: (NOTE) The Xpert Xpress SARS-CoV-2/FLU/RSV assay is intended as an aid in  the diagnosis of influenza from Nasopharyngeal swab specimens and  should not be used as a sole basis for treatment. Nasal washings and  aspirates are unacceptable for Xpert Xpress SARS-CoV-2/FLU/RSV  testing.  Fact Sheet for Patients: PinkCheek.be  Fact Sheet for Healthcare Providers: GravelBags.it  This test is not yet approved or cleared by the [place] FDA and  has been authorized for detection and/or diagnosis of SARS-CoV-2 by  FDA under an Emergency Use Authorization (EUA). This EUA will remain  in effect (meaning this test can be used) for the duration of the  Covid-19 declaration under Section  564(b)(1) of the Act, 21  U.S.C. section 360bbb-3(b)(1), unless the authorization is  terminated or revoked. Performed at [facility], [address] [place], [place] [address]   Magnesium     Status: None   Collection Time: 04/20/20  8:06 PM  Result Value Ref Range   Magnesium 2.1 1.7 - 2.4 mg/dL    Comment: Performed at [facility], [address] [place], [place] [address]  Ethanol     Status: None   Collection Time: 04/20/20 10:29 PM  Result Value Ref Range   Alcohol, Ethyl (B) <10 <10 mg/dL    Comment: (NOTE) Lowest detectable limit for serum alcohol is 10 mg/dL.  For medical purposes only. Performed at [facility], [address] [place], [place] [address]   CBC     Status: Abnormal   Collection Time: 04/21/20  4:34 AM  Result Value Ref Range   WBC 2.9 (L) 4.0 - 10.5 K/uL   RBC 3.93 (L) 4.22 - 5.81 MIL/uL   Hemoglobin 12.4 (L) 13.0 - 17.0 g/dL   HCT 36.7 (L) 39 - 52 %   MCV 93.4 80.0 - 100.0 fL   MCH 31.6 26.0 - 34.0 pg   MCHC 33.8 30.0 - 36.0 g/dL   RDW 11.5 11.5 - 15.5 %   Platelets 142 (L) 150 - 400 K/uL   nRBC 0.0 0.0 - 0.2 %    Comment: Performed at [facility], [address] [place], [place] [address]  Basic metabolic panel     Status: Abnormal   Collection Time: 04/21/20  4:34 AM  Result Value Ref Range   Sodium 131 (L) 135 - 145 mmol/L   Potassium 3.6 3.5 - 5.1 mmol/L   Chloride 99 98 - 111 mmol/L   CO2 25 22 - 32 mmol/L   Glucose, Bld 78 70 - 99 mg/dL    Comment: Glucose reference range applies only to samples taken after fasting for at least 8 hours.   BUN 11 8 - 23 mg/dL   Creatinine, Ser 0.88 0.61 - 1.24 mg/dL   Calcium 8.7 (L) 8.9 - 10.3 mg/dL   GFR, Estimated >60 >60 mL/min    Comment: (NOTE) Calculated using the CKD-EPI Creatinine Equation (2021)    Anion gap 7 5 - 15    Comment: Performed at [facility], [address] [place], [place] [address]    Current Facility-Administered  Medications  Medication Dose Route Frequency Provider Last Rate Last Admin  . acetaminophen (TYLENOL) tablet 650 mg  650 mg Oral Q6H PRN Lenore Cordia, MD       Or  . acetaminophen (TYLENOL) suppository 650 mg  650 mg Rectal Q6H PRN Zada Finders R, MD      . donepezil (ARICEPT) tablet 10 mg  10 mg Oral QHS Amin, Soundra Pilon, MD      . enoxaparin (LOVENOX) injection 40 mg  40 mg Subcutaneous Q24H Zada Finders R, MD   40 mg at 04/20/20 2154  . lisinopril (ZESTRIL) tablet 20 mg  20 mg Oral Daily Shanlever, Pierce Crane, RPH       And  . hydrochlorothiazide (HYDRODIURIL) tablet 25 mg  25 mg Oral Daily Lu Duffel, [facility]      . nicotine (NICODERM CQ - dosed in mg/24 hours) patch 21 mg  21 mg Transdermal Daily Lenore Cordia, MD   21 mg at 04/20/20 2154  . sodium chloride flush (NS) 0.9 % injection 3 mL  3 mL Intravenous Q12H Lenore Cordia, MD   3 mL at 04/21/20 2725    Musculoskeletal: Strength & Muscle Tone: within normal limits Gait & Station: normal Patient leans: N/A  Psychiatric Specialty Exam: Physical Exam Vitals and nursing note reviewed.  Constitutional:      Appearance: He is well-developed.  HENT:     Head: Normocephalic and atraumatic.  Eyes:     Conjunctiva/sclera: Conjunctivae normal.     Pupils: Pupils are equal, round, and reactive to light.  Cardiovascular:     Heart sounds: Normal heart sounds.  Pulmonary:     Effort: Pulmonary effort is normal.  Abdominal:     Palpations: Abdomen is soft.  Musculoskeletal:        General: Normal range of motion.     Cervical back: Normal range of motion.  Skin:    General: Skin is warm and dry.  Neurological:     Mental Status: He is alert.  Psychiatric:        Attention and Perception: Attention normal.        Mood and Affect: Mood normal.        Speech: Speech normal.        Behavior: Behavior is cooperative.        Thought Content: Thought content normal.        Cognition and Memory: Memory is impaired.         Judgment: Judgment normal.     Review of Systems  Constitutional: Negative.   HENT: Negative.   Eyes: Negative.   Respiratory: Negative.   Cardiovascular: Negative.   Gastrointestinal: Positive for abdominal pain.  Musculoskeletal: Negative.   Skin: Negative.   Neurological: Negative.   Psychiatric/Behavioral: Negative.     Blood pressure (!) 143/85, pulse 81, temperature 98.2 F (36.8 C), temperature source Oral, resp. rate 18, height 6' (1.829 m), weight 81.2 kg, SpO2 97 %.Body mass index is 24.28 kg/m.  General Appearance: Casual  Eye Contact:  Good  Speech:  Clear and Coherent  Volume:  Normal  Mood:  Euthymic  Affect:  Constricted  Thought Process:  Goal Directed  Orientation:  Negative  Thought Content:  Logical  Suicidal Thoughts:  No  Homicidal Thoughts:  No  Memory:  Immediate;   Fair Recent;   Poor Remote;   Fair  Judgement:  Fair  Insight:  Shallow  Psychomotor Activity:  Decreased  Concentration:  Concentration: Poor  Recall:  Poor  Fund of Knowledge:  Fair  Language:  Fair  Akathisia:  No  Handed:  Right  AIMS (if indicated):     Assets:  Desire for Improvement Housing Resilience Social Support  ADL's:  Intact  Cognition:  Impaired,  Mild  Sleep:        Treatment Plan Summary: Plan 79 year old man who appears to probably have a mild degree of dementia.  Could be multifactorial but certainly fits within a profile that could simply be Alzheimer's disease.  Patient does not show any signs of acute mood disorder.  He is not psychotic.  When I explained to him his situation and why he had come to the hospital he was accepting of that.  I reassured him that he would probably only be in the hospital a relatively short amount of time while they made sure that his heart and abdominal problems were under control.  Patient does not meet commitment criteria does not require IVC.  That will be discontinued.  I do not see any real indication for a sitter staying  with him.  Does not really need any psych follow-up either.  We can follow just as needed.  Disposition: Patient does not meet criteria for psychiatric inpatient admission. Supportive therapy provided about ongoing stressors.  Alethia Berthold, MD 04/21/2020 4:32 PM

## 2020-04-21 NOTE — ED Notes (Signed)
Pt requested urinal, used urinal independently. Pt awake and pleasant this morning. HR in 56-70 at this time.

## 2020-04-21 NOTE — ED Notes (Signed)
Introduced self to pt. Pt pleasant and cooperative at this time. Pt has sitter at bedside. Requests water.
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
## 2020-04-21 NOTE — Evaluation (Signed)
Occupational Therapy Evaluation Patient Details Name: Craig [name] MRN: [id number] DOB: 1941/01/31 Today's Date: 04/21/2020    History of Present Illness 79 y.o. 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  History is limited from patient due to altered mental status and is otherwise supplemented by EDP, chart review, and daughter by phone. Presenting history is unclear.  Per ED documentation EMS were called to his residence.  He was apparently agitated but also complaining of chest and abdominal pain.  He was brought to the ED for further evaluation.  He was agitated on arrival and disoriented.  He was repetitively saying that God has given ESP per ED documentation.  He is not felt to have capacity to leave [facility] therefore he was placed under IVC.   Clinical Impression   Patient presenting with decreased I in self care, balance, functional mobility/transfer, endurance, and safety awareness.  Patient reports living alone and "I can do all those things myself" when asked about self care PTA. Pt reports he does not use AD at home.  Patient currently functioning at supervision level overall without use of AD. Pt is impulsive with mobility and needs min cuing for safety awareness and to identify fall risks. Pt ambulating to sink for grooming tasks with min - mod cuing for sequencing and initiation of tasks. Pt lives alone and per chart he does not have any assistance or family nearby. Pt confirms this as well but may be poor historian. Therefore, OT recommends SNF at discharge to address safety with self care and functional mobility tasks.  Patient will benefit from acute OT to increase overall independence in the areas of ADLs, functional mobility, and safety awarness in order to safely discharge to next venue of care.    Follow Up  Recommendations  SNF;Supervision/Assistance - 24 hour    Equipment Recommendations  None recommended by OT       Precautions / Restrictions Precautions Precautions: Fall Precaution Comments: IVC      Mobility Bed Mobility Overal bed mobility: Needs Assistance Bed Mobility: Supine to Sit;Sit to Supine     Supine to sit: Supervision Sit to supine: Supervision   General bed mobility comments: for safety - pt is impulsive    Transfers Overall transfer level: Needs assistance Equipment used: None Transfers: Sit to/from Stand;Stand Pivot Transfers Sit to Stand: Supervision Stand pivot transfers: Supervision       General transfer comment: min cuing for safety awareness    Balance Overall balance assessment: Needs assistance Sitting-balance support: Feet unsupported Sitting balance-Leahy Scale: Good     Standing balance support: During functional activity Standing balance-Leahy Scale: Good          ADL either performed or assessed with clinical judgement   ADL Overall ADL's : Needs assistance/impaired        General ADL Comments: supervision overall without use of AD. Pt needing min - mod multimodal cuing for sequencing and initiation of self care tasks     Vision Baseline Vision/History: Wears glasses Wears Glasses: Reading only Patient Visual Report: No change from baseline              Pertinent Vitals/Pain Pain Assessment: No/denies pain     Hand Dominance Right   Extremity/Trunk Assessment Upper Extremity Assessment Upper Extremity Assessment: Overall WFL for tasks assessed   Lower Extremity Assessment Lower Extremity Assessment: Overall  WFL for tasks assessed       Communication Communication Communication: No difficulties   Cognition Arousal/Alertness: Awake/alert Behavior During Therapy: WFL for tasks assessed/performed Overall Cognitive Status: History of cognitive impairments - at baseline   General Comments: Per chart pt has hx  of dementia. Pt very pleasant and cooperative but tangential in speech when asked about home situation.              Home Living Family/patient expects to be discharged to:: Private residence Living Arrangements: Alone   Type of Home: House       Home Layout: One level     Home Equipment: None          Prior Functioning/Environment Level of Independence: Independent           OT Problem List: Decreased activity tolerance;Decreased safety awareness;Impaired balance (sitting and/or standing);Decreased knowledge of use of DME or AE;Decreased knowledge of precautions      OT Treatment/Interventions: Self-care/ADL training;Therapeutic exercise;Therapeutic activities;Energy conservation;Patient/family education;DME and/or AE instruction;Balance training    OT Goals(Current goals can be found in the care plan section) Acute Rehab OT Goals Patient Stated Goal: none stated OT Goal Formulation: Patient unable to participate in goal setting Time For Goal Achievement: 05/05/20 Potential to Achieve Goals: Good ADL Goals Pt Will Perform Grooming: with modified independence Pt Will Transfer to Toilet: with modified independence Pt Will Perform Toileting - Clothing Manipulation and hygiene: with modified independence Pt Will Perform Tub/Shower Transfer: with supervision  OT Frequency: Min 1X/week   Barriers to D/C: Decreased caregiver support             AM-PAC OT "6 Clicks" Daily Activity     Outcome Measure Help from another person eating meals?: None Help from another person taking care of personal grooming?: A Little Help from another person toileting, which includes using toliet, bedpan, or urinal?: A Little Help from another person bathing (including washing, rinsing, drying)?: A Little Help from another person to put on and taking off regular upper body clothing?: A Little Help from another person to put on and taking off regular lower body clothing?: A Little 6  Click Score: 19   End of Session    Activity Tolerance: Patient tolerated treatment well Patient left: in bed;with nursing/sitter in room  OT Visit Diagnosis: Unsteadiness on feet (R26.81);History of falling (Z91.81)                Time: 1761-6073 OT Time Calculation (min): 24 min Charges:  OT General Charges $OT Visit: 1 Visit OT Treatments $Self Care/Home Management : 8-22 mins $Therapeutic Activity: 8-22 mins  Darleen Crocker, MS, OTR/L , CBIS ascom [phone]  04/21/20, 3:05 PM

## 2020-04-21 NOTE — Progress Notes (Signed)
Secure chat sent to on-call provider, Sharion Settler, MP: "patient in with bradycardia, AMS, Hx of atrila flutter, dementia; telemetry ordered, but will NOT keep it on and is getting aggitated with staff; suggestions..." Awaiting acknowledgement and orders. Barbaraann Faster, RN 10:32 PM 04/21/2020

## 2020-04-21 NOTE — ED Notes (Signed)
Pt woke up to monitor sounds and was reminded by this tech that he is at the hospital. Pt agreeable and now resting with eyes closed. HR 40s-60s at this time.

## 2020-04-21 NOTE — Progress Notes (Addendum)
PROGRESS NOTE    Craig [name]  ULA:[id number] DOB: 1941-02-28 DOA: 04/20/2020 PCP: [name], MD   Brief Narrative:  Craig [name] is a 79 y.o. 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 Presenting history is unclear.  Per ED documentation EMS were called to his residence.  He was apparently agitated but also complaining of chest and abdominal pain.  He was brought to the ED for further evaluation.  He was agitated on arrival and disoriented.  He was repetitively saying that God has given ESP per ED documentation.  He is not felt to have capacity to leave [place] therefore he was placed under IVC. Daughter was called by admitting provider and she was on aware of his hospitalization.  At baseline he is able to communicate and complete all his ADLs.  He lives alone.  Patient has an history of dementia and was prescribed Aricept but she does not think that he was taking his medications regularly. Did develop one transient episode of bradycardia while in ED, remained stable since then. Imaging which includes CT head and CT abdomen and pelvis was without any acute abnormalities.  Labs within normal limit.  Chest x-ray with mildly large cardiac silhouette without any other acute abnormality. Psych was also consulted.  Subjective: Patient appears comfortable and eating his breakfast when seen during morning rounds.  Denies any complaint.  Stating that God has gifted him ESP.  Oriented to name only.  Sitter was at bedside.  Assessment & Plan:   Principal Problem:   Altered mental status Active Problems:   CAD (coronary artery disease), native coronary artery   Hyperlipidemia   Essential hypertension   Memory loss   Atrial flutter (HCC)   Bradycardia  Altered mental status/dementia with behavioral  disturbance/psychosis. Patient still oriented to name only.  Continues to state that God gave him ESP.  Waiting for psych evaluation.  He was much calmer when seen today. On review of previous records, he did state to his cardiologist that he thinks he has ESPN can see things coming in the future per outpatient cardiology note 10/16/2016.  Daughter states that he was on Aricept for suspected dementia but has not been taking it.  No significant lab or imaging abnormality , B12 at 210 which is lower normal limit. -Give him one-time dose of IM B12, followed by oral supplement. -Restart home Aricept. -Formal psych evaluation-consult was placed. -PT/OT evaluation for disposition needs.  Atrial flutter with bradycardia. Seen by cardiology, Dr. Rockey Situ and Dr. Clayborn Bigness previously.  Was on Xarelto in the past but does not appear to have been taking it recently.  Patient had transient bradycardia down to 35 while in the ED. heart rate stable and 60s since then.  Patient remained asymptomatic. Per prior cardiology documentation he refused EP ablation in the past.  EDP discussed with on-call cardiology who recommended observation on telemetry.  Not on any rate limiting medications. -Not currently on anticoagulation, not sure he is a good candidate for anticoagulation at this time due to nonadherence and unclear cognitive status. -Continue with telemetry.  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  No official documentation available.  He is not on aspirin or statin therapy.  Currently denies any chest pain.  Troponin is negative x2.  Hypertension: Blood pressure mildly elevated. -  Resume home dose of Zestoretic.  Hyperlipidemia: Not currently on statin therapy.  Tobacco use: Reports smoking 1 pack/day.  Nicotine patch ordered.  Objective: Vitals:   04/21/20 1130 04/21/20 1145 04/21/20 1200 04/21/20 1216  BP:    (!) 142/84  Pulse: 65 (!) 112 61 82   Resp:    20  Temp:    98 F (36.7 C)  TempSrc:    Oral  SpO2: 98% 99% 97% 97%  Weight:      Height:       No intake or output data in the 24 hours ending 04/21/20 1412 Filed Weights   04/20/20 1220  Weight: 81.2 kg    Examination:  General exam: Frail and unkept appearing gentleman,appears calm and comfortable  Respiratory system: Clear to auscultation. Respiratory effort normal. Cardiovascular system: S1 & S2 heard, RRR.  Gastrointestinal system: Soft, nontender, nondistended, bowel sounds positive. Central nervous system: Alert and oriented. No focal neurological deficits. Extremities: No edema, no cyanosis, pulses intact and symmetrical. Psychiatry: Judgement and insight appear impaired.  DVT prophylaxis: Lovenox Code Status: Full Family Communication: Call daughter with no response. Disposition Plan:  Status is: Inpatient  Dispo: The patient is from: Home              Anticipated d/c is to: To be determined              Anticipated d/c date is: 1 day              Patient currently is not medically stable to d/c.   Consultants:   EDP did a phone consult with cardiology.  Procedures:  Antimicrobials:   Data Reviewed: I have personally reviewed following labs and imaging studies  CBC: Recent Labs  Lab 04/20/20 1234 04/21/20 0434  WBC 3.4* 2.9*  HGB 13.3 12.4*  HCT 40.2 36.7*  MCV 95.9 93.4  PLT 143* 161*   Basic Metabolic Panel: Recent Labs  Lab 04/20/20 1526 04/20/20 2006 04/21/20 0434  NA 131*  --  131*  K 4.0  --  3.6  CL 97*  --  99  CO2 25  --  25  GLUCOSE 86  --  78  BUN 12  --  11  CREATININE 0.80  --  0.88  CALCIUM 8.7*  --  8.7*  MG  --  2.1  --    GFR: Estimated Creatinine Clearance: 77.2 mL/min (by C-G formula based on SCr of 0.88 mg/dL). Liver Function Tests: Recent Labs  Lab 04/20/20 1526  AST 17  ALT 15  ALKPHOS 57  BILITOT 1.0  PROT 9.0*  ALBUMIN 3.3*   Recent Labs  Lab 04/20/20 1526  LIPASE 25   No results  for input(s): AMMONIA in the last 168 hours. Coagulation Profile: No results for input(s): INR, PROTIME in the last 168 hours. Cardiac Enzymes: No results for input(s): CKTOTAL, CKMB, CKMBINDEX, TROPONINI in the last 168 hours. BNP (last 3 results) No results for input(s): PROBNP in the last 8760 hours. HbA1C: No results for input(s): HGBA1C in the last 72 hours. CBG: No results for input(s): GLUCAP in the last 168 hours. Lipid Profile: No results for input(s): CHOL, HDL, LDLCALC, TRIG, CHOLHDL, LDLDIRECT in the last 72 hours. Thyroid Function Tests: Recent Labs    04/20/20 1549  TSH 2.257   Anemia Panel: Recent Labs    04/20/20 1549  VITAMINB12 210   Sepsis Labs: No results for input(s): PROCALCITON, LATICACIDVEN in the last 168 hours.  Recent Results (from  the past 240 hour(s))  Respiratory Panel by RT PCR (Flu A&B, Covid) - Nasopharyngeal Swab     Status: None   Collection Time: 04/20/20  4:41 PM   Specimen: Nasopharyngeal Swab  Result Value Ref Range Status   SARS Coronavirus 2 by RT PCR NEGATIVE NEGATIVE Final    Comment: (NOTE) SARS-CoV-2 target nucleic acids are NOT DETECTED.  The SARS-CoV-2 RNA is generally detectable in upper respiratoy specimens during the acute phase of infection. The lowest concentration of SARS-CoV-2 viral copies this assay can detect is 131 copies/mL. A negative result does not preclude SARS-Cov-2 infection and should not be used as the sole basis for treatment or other patient management decisions. A negative result may occur with  improper specimen collection/handling, submission of specimen other than nasopharyngeal swab, presence of viral mutation(s) within the areas targeted by this assay, and inadequate number of viral copies (<131 copies/mL). A negative result must be combined with clinical observations, patient history, and epidemiological information. The expected result is Negative.  Fact Sheet for Patients:   PinkCheek.be  Fact Sheet for Healthcare Providers:  GravelBags.it  This test is no t yet approved or cleared by the [place] FDA and  has been authorized for detection and/or diagnosis of SARS-CoV-2 by FDA under an Emergency Use Authorization (EUA). This EUA will remain  in effect (meaning this test can be used) for the duration of the COVID-19 declaration under Section 564(b)(1) of the Act, 21 U.S.C. section 360bbb-3(b)(1), unless the authorization is terminated or revoked sooner.     Influenza A by PCR NEGATIVE NEGATIVE Final   Influenza B by PCR NEGATIVE NEGATIVE Final    Comment: (NOTE) The Xpert Xpress SARS-CoV-2/FLU/RSV assay is intended as an aid in  the diagnosis of influenza from Nasopharyngeal swab specimens and  should not be used as a sole basis for treatment. Nasal washings and  aspirates are unacceptable for Xpert Xpress SARS-CoV-2/FLU/RSV  testing.  Fact Sheet for Patients: PinkCheek.be  Fact Sheet for Healthcare Providers: GravelBags.it  This test is not yet approved or cleared by the [place] FDA and  has been authorized for detection and/or diagnosis of SARS-CoV-2 by  FDA under an Emergency Use Authorization (EUA). This EUA will remain  in effect (meaning this test can be used) for the duration of the  Covid-19 declaration under Section 564(b)(1) of the Act, 21  U.S.C. section 360bbb-3(b)(1), unless the authorization is  terminated or revoked. Performed at [facility], [address], [place], [place] [address]      Radiology Studies: CT Head Wo Contrast  Result Date: 04/20/2020 CLINICAL DATA:  Mental status change. EXAM: CT HEAD WITHOUT CONTRAST TECHNIQUE: Contiguous axial images were obtained from the base of the skull through the vertex without intravenous contrast. COMPARISON:  None. FINDINGS: Brain: No acute  hemorrhage. Generalized atrophy. Moderate to advanced periventricular and deep white matter hypodensity, nonspecific but typical of chronic small vessel ischemia. No evidence of acute ischemia or territorial infarct. No hydrocephalus, midline shift, or mass effect. Minimal basal gangliar mineralization is likely senescent. No subdural or extra-axial collection. Vascular: Atherosclerosis of skullbase vasculature without hyperdense vessel or abnormal calcification. Skull: No fracture or focal lesion. Sinuses/Orbits: Paranasal sinuses and mastoid air cells are clear. The visualized orbits are unremarkable. Other: None. IMPRESSION: 1. No acute intracranial abnormality. 2. Generalized atrophy and moderate to advanced chronic small vessel ischemia. Electronically Signed   By: Keith Rake M.D.   On: 04/20/2020 16:30   CT ABDOMEN PELVIS W  CONTRAST  Result Date: 04/20/2020 CLINICAL DATA:  Abdominal pain, nausea and vomiting, bradycardia EXAM: CT ABDOMEN AND PELVIS WITH CONTRAST TECHNIQUE: Multidetector CT imaging of the abdomen and pelvis was performed using the standard protocol following bolus administration of intravenous contrast. CONTRAST:  128mL OMNIPAQUE IOHEXOL 300 MG/ML  SOLN COMPARISON:  10/31/2017 FINDINGS: Lower chest: Heart is enlarged without pericardial effusion. No acute pleural or parenchymal lung disease. Hepatobiliary: Stable hypodensity right lobe liver measuring 2.3 cm, demonstrating delayed centripetal enhancement consistent with hemangioma. No other focal liver abnormalities. Gallbladder is unremarkable. No biliary dilation. Pancreas: Unremarkable. No pancreatic ductal dilatation or surrounding inflammatory changes. Spleen: Normal in size without focal abnormality. Adrenals/Urinary Tract: Mild bilateral renal cortical thinning. Otherwise the kidneys enhance normally and symmetrically. Adrenals are grossly normal. Bladder is decompressed, limiting evaluation. Stomach/Bowel: No bowel obstruction  or ileus. No bowel wall thickening or inflammatory change. Vascular/Lymphatic: Aortic atherosclerosis. No enlarged abdominal or pelvic lymph nodes. Reproductive: Radiotherapy seeds are seen within the prostate. Other: No free fluid or free gas. No abdominal wall hernia. Musculoskeletal: No acute or destructive bony lesions. Prominent spondylosis and facet hypertrophy at the lumbosacral junction. Reconstructed images demonstrate no additional findings. IMPRESSION: 1. No acute intra-abdominal or intrapelvic process. 2. Stable right lobe liver hemangioma. 3. Aortic Atherosclerosis (ICD10-I70.0). Electronically Signed   By: Randa Ngo M.D.   On: 04/20/2020 16:32   DG Chest Portable 1 View  Result Date: 04/20/2020 CLINICAL DATA:  Right lower abdominal pain, altered level of consciousness, previous tobacco abuse EXAM: PORTABLE CHEST 1 VIEW COMPARISON:  09/08/2017 FINDINGS: 2 frontal views of the chest demonstrate mild enlargement the cardiac silhouette, likely exacerbated by portable AP technique. No airspace disease, effusion, or pneumothorax. No acute bony abnormalities. IMPRESSION: 1. Mild enlargement of the cardiac silhouette. 2. No acute intrathoracic process. Electronically Signed   By: Randa Ngo M.D.   On: 04/20/2020 15:36    Scheduled Meds: . donepezil  10 mg Oral QHS  . enoxaparin (LOVENOX) injection  40 mg Subcutaneous Q24H  . lisinopril  20 mg Oral Daily   And  . hydrochlorothiazide  25 mg Oral Daily  . nicotine  21 mg Transdermal Daily  . sodium chloride flush  3 mL Intravenous Q12H   Continuous Infusions:   LOS: 0 days   Time spent: 30 minutes.  Lorella Nimrod, MD [facility]  If 7PM-7AM, please contact night-coverage [email or web address]  04/21/2020, 2:12 PM   This record has been created using Systems analyst. Errors have been sought and corrected,but may not always be located. Such creation errors do not reflect on the standard of care.

## 2020-04-22 DIAGNOSIS — E43 Unspecified severe protein-calorie malnutrition: Secondary | ICD-10-CM | POA: Insufficient documentation

## 2020-04-22 LAB — CBC
HCT: 38.1 % — ABNORMAL LOW (ref 39.0–52.0)
Hemoglobin: 12.9 g/dL — ABNORMAL LOW (ref 13.0–17.0)
MCH: 31.8 pg (ref 26.0–34.0)
MCHC: 33.9 g/dL (ref 30.0–36.0)
MCV: 93.8 fL (ref 80.0–100.0)
Platelets: 155 10*3/uL (ref 150–400)
RBC: 4.06 MIL/uL — ABNORMAL LOW (ref 4.22–5.81)
RDW: 11.6 % (ref 11.5–15.5)
WBC: 3 10*3/uL — ABNORMAL LOW (ref 4.0–10.5)
nRBC: 0 % (ref 0.0–0.2)

## 2020-04-22 LAB — BASIC METABOLIC PANEL
Anion gap: 9 (ref 5–15)
BUN: 11 mg/dL (ref 8–23)
CO2: 21 mmol/L — ABNORMAL LOW (ref 22–32)
Calcium: 8.6 mg/dL — ABNORMAL LOW (ref 8.9–10.3)
Chloride: 99 mmol/L (ref 98–111)
Creatinine, Ser: 0.9 mg/dL (ref 0.61–1.24)
GFR, Estimated: >60 mL/min (ref >60)
Glucose, Bld: 75 mg/dL (ref 70–99)
Potassium: 4.6 mmol/L (ref 3.5–5.1)
Sodium: 129 mmol/L — ABNORMAL LOW (ref 135–145)

## 2020-04-22 LAB — OSMOLALITY, URINE: Osmolality, Ur: 299 mOsm/kg — ABNORMAL LOW (ref 300–900)

## 2020-04-22 LAB — FOLATE RBC
Folate, Hemolysate: 271 ng/mL
Folate, RBC: 730 ng/mL (ref >498)
Hematocrit: 37.1 % — ABNORMAL LOW (ref 37.5–51.0)

## 2020-04-22 LAB — SODIUM, URINE, RANDOM: Sodium, Ur: 55 mmol/L

## 2020-04-22 MED ORDER — ENSURE ENLIVE PO LIQD
237.0000 mL | Freq: Three times a day (TID) | ORAL | Status: DC
  Administered 2020-04-22 – 2020-04-26 (×8): 237 mL via ORAL

## 2020-04-22 MED ORDER — ADULT MULTIVITAMIN W/MINERALS CH
1.0000 | ORAL_TABLET | Freq: Every day | ORAL | Status: DC
  Administered 2020-04-23 – 2020-04-26 (×3): 1 via ORAL
  Filled 2020-04-22 (×3): qty 1

## 2020-04-22 NOTE — Progress Notes (Addendum)
PROGRESS NOTE    Craig [name]  CWC:[id number] DOB: 1940/10/04 DOA: 04/20/2020 PCP: [name], MD   Brief Narrative:  Craig [name] is a 80 y.o. 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 Presenting history is unclear.  Per ED documentation EMS were called to his residence.  He was apparently agitated but also complaining of chest and abdominal pain.  He was brought to the ED for further evaluation.  He was agitated on arrival and disoriented.  He was repetitively saying that God has given ESP per ED documentation.  He is not felt to have capacity to leave [place] therefore he was placed under IVC. Daughter was called by admitting provider and she was on aware of his hospitalization.  At baseline he is able to communicate and complete all his ADLs.  He lives alone.  Patient has an history of dementia and was prescribed Aricept but she does not think that he was taking his medications regularly. Did develop one transient episode of bradycardia while in ED, remained stable since then. Imaging which includes CT head and CT abdomen and pelvis was without any acute abnormalities.  Labs within normal limit.  Chest x-ray with mildly large cardiac silhouette without any other acute abnormality. Psych was also consulted-he does not need inpatient behavioral health services and IVC it was discontinued by psychiatrist. PT is recommending SNF placement.  Subjective: Patient has no new complaint today.  Oriented to self only but appears calm.  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 Patient still oriented to name only.  He was much calmer when seen today. On review of previous records, he did state to his cardiologist that he thinks he has ESPN can see things coming in the future per outpatient cardiology note 10/16/2016.  Daughter states that he was on Aricept for suspected dementia but has not been taking it.  Psychiatry evaluated him today and he does not need inpatient behavioral health.  They discontinued IVC. No significant lab or imaging abnormality , B12 at 210 which is lower normal limit. -Received one-time dose of IM B12 yesterday, followed by oral supplement. -Continue home Aricept. -PT/OT evaluation -recommending SNF, TOC consult was placed for placement.  Hyponatremia.  Sodium at 129. -We will check hyponatremia labs. -Continue to monitor  Atrial flutter with bradycardia.  Resolved Seen by cardiology, Dr. Rockey Situ and Dr. Clayborn Bigness previously.  Was on Xarelto in the past but does not appear to have been taking it recently.  Patient had transient bradycardia down to 35 while in the ED. heart rate stable and 60s since then.  Patient remained asymptomatic. Per prior cardiology documentation he refused EP ablation in the past.  EDP discussed with on-call cardiology who recommended observation on telemetry.  Not on any rate limiting medications. -Not currently on anticoagulation, not sure he is a good candidate for anticoagulation at this time due to nonadherence and unclear cognitive status. -Continue with telemetry.  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  No official documentation available.  He is not on aspirin or statin therapy.  Currently denies any chest pain.  Troponin is negative x2.  Hypertension: Blood pressure mildly elevated. -Continue home dose of Zestoretic.  Hyperlipidemia: Not currently on statin therapy.  Tobacco  use: Reports smoking 1 pack/day.  Nicotine patch ordered.  Objective: Vitals:   04/21/20 2225 04/22/20 0053 04/22/20 0749 04/22/20 1202  BP:  (!) 141/89 133/66 (!) 99/53  Pulse: 89 60 60 (!) 55  Resp:  18 17 17   Temp:  (!) 97.5 F (36.4 C) 98 F (36.7 C) 98.2 F (36.8 C)  TempSrc:  Oral  Oral  SpO2:  100% 100% 100%  Weight:      Height:        Intake/Output Summary (Last 24 hours) at 04/22/2020 1753 Last data filed at 04/22/2020 9147 Gross per 24 hour  Intake 480 ml  Output 900 ml  Net -420 ml   Filed Weights   04/20/20 1220  Weight: 81.2 kg    Examination:  General.  Frail elderly man, in no acute distress. Pulmonary.  Lungs clear bilaterally, normal respiratory effort. CV.  Regular rate and rhythm, no JVD, rub or murmur. Abdomen.  Soft, nontender, nondistended, BS positive. CNS.  Alert and oriented to self only.  No focal neurologic deficit. Extremities.  No edema, no cyanosis, pulses intact and symmetrical. Psychiatry.  Judgment and insight appears impaired.  DVT prophylaxis: Lovenox Code Status: Full Family Communication: Call daughter with no response. Disposition Plan:  Status is: Inpatient  Dispo: The patient is from: Home              Anticipated d/c is to: To be determined              Anticipated d/c date is: 1 day              Patient currently is not medically stable to d/c.   Consultants:   EDP did a phone consult with cardiology.  Procedures:  Antimicrobials:   Data Reviewed: I have personally reviewed following labs and imaging studies  CBC: Recent Labs  Lab 04/20/20 1234 04/20/20 1549 04/21/20 0434 04/22/20 1003  WBC 3.4*  --  2.9* 3.0*  HGB 13.3  --  12.4* 12.9*  HCT 40.2 37.1* 36.7* 38.1*  MCV 95.9  --  93.4 93.8  PLT 143*  --  142* 829   Basic Metabolic Panel: Recent Labs  Lab 04/20/20 1526 04/20/20 2006 04/21/20 0434 04/22/20 0925  NA 131*  --  131* 129*  K 4.0  --  3.6 4.6  CL 97*  --  99 99  CO2 25  --  25 21*   GLUCOSE 86  --  78 75  BUN 12  --  11 11  CREATININE 0.80  --  0.88 0.90  CALCIUM 8.7*  --  8.7* 8.6*  MG  --  2.1  --   --    GFR: Estimated Creatinine Clearance: 75.4 mL/min (by C-G formula based on SCr of 0.9 mg/dL). Liver Function Tests: Recent Labs  Lab 04/20/20 1526  AST 17  ALT 15  ALKPHOS 57  BILITOT 1.0  PROT 9.0*  ALBUMIN 3.3*   Recent Labs  Lab 04/20/20 1526  LIPASE 25   No results for input(s): AMMONIA in the last 168 hours. Coagulation Profile: No results for input(s): INR, PROTIME in the last 168 hours. Cardiac Enzymes: No results for input(s): CKTOTAL, CKMB, CKMBINDEX, TROPONINI in the last 168 hours. BNP (last 3 results) No  results for input(s): PROBNP in the last 8760 hours. HbA1C: No results for input(s): HGBA1C in the last 72 hours. CBG: No results for input(s): GLUCAP in the last 168 hours. Lipid Profile: No results for input(s): CHOL, HDL, LDLCALC, TRIG, CHOLHDL, LDLDIRECT in the last 72 hours. Thyroid Function Tests: Recent Labs    04/20/20 1549  TSH 2.257   Anemia Panel: Recent Labs    04/20/20 1549  VITAMINB12 210   Sepsis Labs: No results for input(s): PROCALCITON, LATICACIDVEN in the last 168 hours.  Recent Results (from the past 240 hour(s))  Respiratory Panel by RT PCR (Flu A&B, Covid) - Nasopharyngeal Swab     Status: None   Collection Time: 04/20/20  4:41 PM   Specimen: Nasopharyngeal Swab  Result Value Ref Range Status   SARS Coronavirus 2 by RT PCR NEGATIVE NEGATIVE Final    Comment: (NOTE) SARS-CoV-2 target nucleic acids are NOT DETECTED.  The SARS-CoV-2 RNA is generally detectable in upper respiratoy specimens during the acute phase of infection. The lowest concentration of SARS-CoV-2 viral copies this assay can detect is 131 copies/mL. A negative result does not preclude SARS-Cov-2 infection and should not be used as the sole basis for treatment or other patient management decisions. A negative result may occur  with  improper specimen collection/handling, submission of specimen other than nasopharyngeal swab, presence of viral mutation(s) within the areas targeted by this assay, and inadequate number of viral copies (<131 copies/mL). A negative result must be combined with clinical observations, patient history, and epidemiological information. The expected result is Negative.  Fact Sheet for Patients:  PinkCheek.be  Fact Sheet for Healthcare Providers:  GravelBags.it  This test is no t yet approved or cleared by the [place] FDA and  has been authorized for detection and/or diagnosis of SARS-CoV-2 by FDA under an Emergency Use Authorization (EUA). This EUA will remain  in effect (meaning this test can be used) for the duration of the COVID-19 declaration under Section 564(b)(1) of the Act, 21 U.S.C. section 360bbb-3(b)(1), unless the authorization is terminated or revoked sooner.     Influenza A by PCR NEGATIVE NEGATIVE Final   Influenza B by PCR NEGATIVE NEGATIVE Final    Comment: (NOTE) The Xpert Xpress SARS-CoV-2/FLU/RSV assay is intended as an aid in  the diagnosis of influenza from Nasopharyngeal swab specimens and  should not be used as a sole basis for treatment. Nasal washings and  aspirates are unacceptable for Xpert Xpress SARS-CoV-2/FLU/RSV  testing.  Fact Sheet for Patients: PinkCheek.be  Fact Sheet for Healthcare Providers: GravelBags.it  This test is not yet approved or cleared by the [place] FDA and  has been authorized for detection and/or diagnosis of SARS-CoV-2 by  FDA under an Emergency Use Authorization (EUA). This EUA will remain  in effect (meaning this test can be used) for the duration of the  Covid-19 declaration under Section 564(b)(1) of the Act, 21  U.S.C. section 360bbb-3(b)(1), unless the authorization is  terminated or  revoked. Performed at [facility], [address], [place], [place] [address]      Radiology Studies: No results found.  Scheduled Meds: . donepezil  10 mg Oral QHS  . enoxaparin (LOVENOX) injection  40 mg Subcutaneous Q24H  . feeding supplement  237 mL Oral TID BM  . lisinopril  20 mg Oral Daily   And  . hydrochlorothiazide  25 mg Oral Daily  . melatonin  5 mg Oral QHS  . [START ON 04/23/2020] multivitamin with  minerals  1 tablet Oral Daily  . nicotine  21 mg Transdermal Daily  . sodium chloride flush  3 mL Intravenous Q12H   Continuous Infusions:   LOS: 1 day   Time spent: 30 minutes.  Lorella Nimrod, MD [facility]  If 7PM-7AM, please contact night-coverage [email or web address]  04/22/2020, 5:53 PM   This record has been created using Systems analyst. Errors have been sought and corrected,but may not always be located. Such creation errors do not reflect on the standard of care.

## 2020-04-22 NOTE — Progress Notes (Signed)
Initial Nutrition Assessment  DOCUMENTATION CODES:   Severe malnutrition in context of social or environmental circumstances  INTERVENTION:   Ensure Enlive po TID, each supplement provides 350 kcal and 20 grams of protein  MVI daily  Liberalize diet   NUTRITION DIAGNOSIS:   Severe Malnutrition related to social / environmental circumstances as evidenced by moderate fat depletion, severe fat depletion, moderate muscle depletion, severe muscle depletion.  GOAL:   Patient will meet greater than or equal to 90% of their needs  MONITOR:   PO intake, Supplement acceptance, Weight trends, Labs, Skin, I & O's  REASON FOR ASSESSMENT:   Malnutrition Screening Tool    ASSESSMENT:   79 y.o. 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   Met with pt in room today. Pt reports good appetite and oral intake at baseline and in hospital. Pt eating 100% of his meals in hospital. Pt reports that he does not drink supplements regularly at home but reports that he has drank them in the past and he enjoys strawberry and vanilla. RD will add supplements and MVI to help pt meet his estimated needs. RD will also liberalize the heart healthy portion of pt's diet as this is restrictive of protein. Per chart, pt appears weight stable at baseline.   Medications reviewed and include: lovenox, melatonin, MVI, nicotine  Labs reviewed: Na 129(L) Wbc- 3.0(L)  NUTRITION - FOCUSED PHYSICAL EXAM:    Most Recent Value  Orbital Region Moderate depletion  Upper Arm Region Severe depletion  Thoracic and Lumbar Region Moderate depletion  Buccal Region Moderate depletion  Temple Region Moderate depletion  Clavicle Bone Region Severe depletion  Clavicle and Acromion Bone Region Severe depletion  Scapular Bone Region Severe depletion  Dorsal Hand Severe depletion   Patellar Region Severe depletion  Anterior Thigh Region Severe depletion  Posterior Calf Region Severe depletion  Edema (RD Assessment) None  Hair Reviewed  Eyes Reviewed  Mouth Reviewed  Skin Reviewed  Nails Reviewed     Diet Order:   Diet Order            Diet 2 gram sodium Room service appropriate? Yes; Fluid consistency: Thin  Diet effective now                EDUCATION NEEDS:   Education needs have been addressed  Skin:  Skin Assessment: Reviewed RN Assessment  Last BM:  11/4  Height:   Ht Readings from Last 1 Encounters:  04/20/20 6' (1.829 m)    Weight:   Wt Readings from Last 1 Encounters:  04/20/20 81.2 kg    Ideal Body Weight:  80.9 kg  BMI:  Body mass index is 24.28 kg/m.  Estimated Nutritional Needs:   Kcal:  2100-2400kcal/day  Protein:  105-120g/day  Fluid:  2.1-2.4L/day  Koleen Distance MS, RD, LDN Please refer to [facility] for RD and/or RD on-call/weekend/after hours pager

## 2020-04-22 NOTE — Progress Notes (Signed)
Craig Settler, NP reassessed; patient continues to refuse telemetry monitor; did not require Haldol overnight, was pleasant and cooperative otherwise, prior to sleep.  RR even/unlabored at this time. Telemetry and Haldol discontinued. Barbaraann Faster, RN; 7:01 AM 04/22/2020

## 2020-04-23 LAB — BASIC METABOLIC PANEL
Anion gap: 7 (ref 5–15)
BUN: 17 mg/dL (ref 8–23)
CO2: 25 mmol/L (ref 22–32)
Calcium: 8.8 mg/dL — ABNORMAL LOW (ref 8.9–10.3)
Chloride: 96 mmol/L — ABNORMAL LOW (ref 98–111)
Creatinine, Ser: 1.06 mg/dL (ref 0.61–1.24)
GFR, Estimated: >60 mL/min (ref >60)
Glucose, Bld: 88 mg/dL (ref 70–99)
Potassium: 4 mmol/L (ref 3.5–5.1)
Sodium: 128 mmol/L — ABNORMAL LOW (ref 135–145)

## 2020-04-23 LAB — OSMOLALITY: Osmolality: 286 mOsm/kg (ref 275–295)

## 2020-04-23 NOTE — Consult Note (Signed)
[facility] Face-to-Face Psychiatry Consult   Reason for Consult: Consult to follow-up for this 79 year old man with dementia Referring Physician:  Reesa Chew Patient Identification: Craig [name] MRN:  [id number] Principal Diagnosis: Dementia [facility]) Diagnosis:  Principal Problem:   Dementia ([place]) Active Problems:   CAD (coronary artery disease), native coronary artery   Hyperlipidemia   Essential hypertension   Memory loss   Altered mental status   Atrial flutter ([place])   Bradycardia   Protein-calorie malnutrition, severe   Total Time spent with patient: 30 minutes  Subjective:   Craig [name] is a 79 y.o. male patient admitted with "I do not really remember".  HPI: Patient seen chart reviewed.  This is a follow-up to a patient I saw when he first came into the hospital.  79 year old man who initially presented for flank pain.  Concern had been raised about his behavior intermittently and his memory.  Patient has had some episodes of agitation and uncooperativeness during his admission.  When I saw him last time it was clear that he had memory deficits but not clear that he was really functioning poorly at home.  Patient today denies any specific complaints.  Denies being depressed.  Denies absolutely any suicidal or homicidal thought.  Denies any hallucinations.  He is able to tell me his address and describe his home.  He tells me that he has electricity there and that he gets his water from the family well.  He has heating at his house.  He says he has access to a telephone and knows how to contact family members when needed.  He reports that he had when he gets sick or has problems he has several family members he can call on.  Patient and I discussed the recommendation that he consider a skilled nursing facility.  Patient said he had considered such a thing in the past but feels that he is taking care of himself adequately at home and would not want to give up his freedom.  Past  Psychiatric History: Evidently some recognition of dementia in the past no other known psychiatric history  Risk to Self: Is patient at risk for suicide?: No Risk to Others:   Prior Inpatient Therapy:   Prior Outpatient Therapy:    Past Medical History:  Past Medical History:  Diagnosis Date  . Atrial flutter ([place])   . Coronary artery disease   . Hyperlipidemia   . Hypertension   . TIA (transient ischemic attack)     Past Surgical History:  Procedure Laterality Date  . arm surgery Left   . HERNIA REPAIR     Family History: No family history on file. Family Psychiatric  History: None reported Social History:  Social History   Substance and Sexual Activity  Alcohol Use Yes     Social History   Substance and Sexual Activity  Drug Use No    Social History   Socioeconomic History  . Marital status: Divorced    Spouse name: Not on file  . Number of children: Not on file  . Years of education: Not on file  . Highest education level: Not on file  Occupational History  . Not on file  Tobacco Use  . Smoking status: Former Smoker    Packs/day: 0.00  . Smokeless tobacco: Current User    Types: Snuff  Substance and Sexual Activity  . Alcohol use: Yes  . Drug use: No  . Sexual activity: Not on file  Other Topics Concern  .  Not on file  Social History Narrative  . Not on file   Social Determinants of Health   Financial Resource Strain:   . Difficulty of Paying Living Expenses: Not on file  Food Insecurity:   . Worried About Charity fundraiser in the Last Year: Not on file  . Ran Out of Food in the Last Year: Not on file  Transportation Needs:   . Lack of Transportation (Medical): Not on file  . Lack of Transportation (Non-Medical): Not on file  Physical Activity:   . Days of Exercise per Week: Not on file  . Minutes of Exercise per Session: Not on file  Stress:   . Feeling of Stress : Not on file  Social Connections:   . Frequency of Communication with  Friends and Family: Not on file  . Frequency of Social Gatherings with Friends and Family: Not on file  . Attends Religious Services: Not on file  . Active Member of Clubs or Organizations: Not on file  . Attends Archivist Meetings: Not on file  . Marital Status: Not on file   Additional Social History:    Allergies:   Allergies  Allergen Reactions  . Penicillins     Labs:  Results for orders placed or performed during the hospital encounter of 04/20/20 (from the past 48 hour(s))  Basic metabolic panel     Status: Abnormal   Collection Time: 04/22/20  9:25 AM  Result Value Ref Range   Sodium 129 (L) 135 - 145 mmol/L   Potassium 4.6 3.5 - 5.1 mmol/L   Chloride 99 98 - 111 mmol/L   CO2 21 (L) 22 - 32 mmol/L   Glucose, Bld 75 70 - 99 mg/dL    Comment: Glucose reference range applies only to samples taken after fasting for at least 8 hours.   BUN 11 8 - 23 mg/dL   Creatinine, Ser 0.90 0.61 - 1.24 mg/dL   Calcium 8.6 (L) 8.9 - 10.3 mg/dL   GFR, Estimated >60 >60 mL/min    Comment: (NOTE) Calculated using the CKD-EPI Creatinine Equation (2021)    Anion gap 9 5 - 15    Comment: Performed at [facility], [address] [place], [place] [address]  CBC     Status: Abnormal   Collection Time: 04/22/20 10:03 AM  Result Value Ref Range   WBC 3.0 (L) 4.0 - 10.5 K/uL   RBC 4.06 (L) 4.22 - 5.81 MIL/uL   Hemoglobin 12.9 (L) 13.0 - 17.0 g/dL   HCT 38.1 (L) 39 - 52 %   MCV 93.8 80.0 - 100.0 fL   MCH 31.8 26.0 - 34.0 pg   MCHC 33.9 30.0 - 36.0 g/dL   RDW 11.6 11.5 - 15.5 %   Platelets 155 150 - 400 K/uL   nRBC 0.0 0.0 - 0.2 %    Comment: Performed at [facility], [address] [place], [place] [address]  Osmolality, urine     Status: Abnormal   Collection Time: 04/22/20  8:30 PM  Result Value Ref Range   Osmolality, Ur 299 (L) 300 - 900 mOsm/kg    Comment: Performed at [facility], [address] [place], [place] [address]  Sodium,  urine, random     Status: None   Collection Time: 04/22/20  8:30 PM  Result Value Ref Range   Sodium, Ur 55 mmol/L    Comment: Performed at [facility], [address] [place], [place] [address]  Osmolality     Status: None   Collection Time: 04/23/20  6:44 AM  Result Value Ref Range   Osmolality 286 275 - 295 mOsm/kg    Comment: Performed at [facility], [address] [place], [place] [address]  Basic metabolic panel     Status: Abnormal   Collection Time: 04/23/20  6:44 AM  Result Value Ref Range   Sodium 128 (L) 135 - 145 mmol/L   Potassium 4.0 3.5 - 5.1 mmol/L   Chloride 96 (L) 98 - 111 mmol/L   CO2 25 22 - 32 mmol/L   Glucose, Bld 88 70 - 99 mg/dL    Comment: Glucose reference range applies only to samples taken after fasting for at least 8 hours.   BUN 17 8 - 23 mg/dL   Creatinine, Ser 1.06 0.61 - 1.24 mg/dL   Calcium 8.8 (L) 8.9 - 10.3 mg/dL   GFR, Estimated >60 >60 mL/min    Comment: (NOTE) Calculated using the CKD-EPI Creatinine Equation (2021)    Anion gap 7 5 - 15    Comment: Performed at [facility], [address] [place], [place] [address]    Current Facility-Administered Medications  Medication Dose Route Frequency Provider Last Rate Last Admin  . acetaminophen (TYLENOL) tablet 650 mg  650 mg Oral Q6H PRN Lenore Cordia, MD   650 mg at 04/22/20 2031   Or  . acetaminophen (TYLENOL) suppository 650 mg  650 mg Rectal Q6H PRN Zada Finders R, MD      . donepezil (ARICEPT) tablet 10 mg  10 mg Oral Connye Burkitt, MD   10 mg at 04/22/20 2031  . enoxaparin (LOVENOX) injection 40 mg  40 mg Subcutaneous Q24H Zada Finders R, MD   40 mg at 04/22/20 2030  . feeding supplement (ENSURE ENLIVE / ENSURE PLUS) liquid 237 mL  237 mL Oral TID BM Lorella Nimrod, MD   237 mL at 04/23/20 1400  . lisinopril (ZESTRIL) tablet 20 mg  20 mg Oral Daily Lu Duffel, RPH   20 mg at 04/23/20 1024   And  . hydrochlorothiazide (HYDRODIURIL)  tablet 25 mg  25 mg Oral Daily Lu Duffel, RPH   25 mg at 04/23/20 1024  . melatonin tablet 5 mg  5 mg Oral QHS Sharion Settler, NP   5 mg at 04/22/20 2109  . multivitamin with minerals tablet 1 tablet  1 tablet Oral Daily Lorella Nimrod, MD   1 tablet at 04/23/20 1024  . nicotine (NICODERM CQ - dosed in mg/24 hours) patch 21 mg  21 mg Transdermal Daily Zada Finders R, MD   21 mg at 04/23/20 1025  . sodium chloride flush (NS) 0.9 % injection 3 mL  3 mL Intravenous Q12H Lenore Cordia, MD   3 mL at 04/23/20 1025    Musculoskeletal: Strength & Muscle Tone: within normal limits Gait & Station: normal Patient leans: N/A  Psychiatric Specialty Exam: Physical Exam Vitals and nursing note reviewed.  Constitutional:      Appearance: He is well-developed.  HENT:     Head: Normocephalic and atraumatic.  Eyes:     Conjunctiva/sclera: Conjunctivae normal.     Pupils: Pupils are equal, round, and reactive to light.  Cardiovascular:     Heart sounds: Normal heart sounds.  Pulmonary:     Effort: Pulmonary effort is normal.  Abdominal:     Palpations: Abdomen is soft.  Musculoskeletal:        General:  Normal range of motion.     Cervical back: Normal range of motion.  Skin:    General: Skin is warm and dry.  Neurological:     General: No focal deficit present.     Mental Status: He is alert.  Psychiatric:        Attention and Perception: Attention normal.        Mood and Affect: Mood normal.        Speech: Speech is tangential.        Behavior: Behavior is cooperative.        Thought Content: Thought content normal.        Cognition and Memory: Cognition is impaired. Memory is impaired.        Judgment: Judgment normal.     Review of Systems  Constitutional: Negative.   HENT: Negative.   Eyes: Negative.   Respiratory: Negative.   Cardiovascular: Negative.   Gastrointestinal: Negative.   Musculoskeletal: Negative.   Skin: Negative.   Neurological: Negative.    Psychiatric/Behavioral: Negative.     Blood pressure 119/63, pulse (!) 58, temperature 98.5 F (36.9 C), temperature source Oral, resp. rate 17, height 6' (1.829 m), weight 81.2 kg, SpO2 100 %.Body mass index is 24.28 kg/m.  General Appearance: Casual  Eye Contact:  Good  Speech:  Clear and Coherent  Volume:  Normal  Mood:  Euthymic  Affect:  Congruent  Thought Process:  Coherent  Orientation:  Full (Time, Place, and Person)  Thought Content:  Logical  Suicidal Thoughts:  No  Homicidal Thoughts:  No  Memory:  Immediate;   Fair Recent;   Fair Remote;   Fair  Judgement:  Fair  Insight:  Fair  Psychomotor Activity:  Normal  Concentration:  Concentration: Fair  Recall:  [employer] of Knowledge:  Fair  Language:  Fair  Akathisia:  No  Handed:  Right  AIMS (if indicated):     Assets:  Desire for Improvement Housing Resilience Social Support  ADL's:  Intact  Cognition:  Impaired,  Mild  Sleep:        Treatment Plan Summary: Plan 79 year old man with mild dementia.  Otherwise currently asymptomatic.  Question arose regarding capacity to decide to continue his current living situation.  Patient currently does have capacity to make that decision.  It requires a very serious compromise of a person's judgment and significant level of danger to take away their rights to make such a fundamental decision.  Rather than judge the patient's capacity impaired I recommend that every effort be made to contact family including his daughter about any concerns about his living situation.  I understand that Adult Protective Services may be involved as well and can assess the situation better on an outpatient basis.  As far as inpatient consultation I do not have any further recommendations for treatment or follow-up for this gentleman other than what he needs for his hypertension or other medical problems.  Disposition: No evidence of imminent risk to self or others at present.   Patient does not  meet criteria for psychiatric inpatient admission. Supportive therapy provided about ongoing stressors. Patient does have capacity to make decisions about his living situation  Alethia Berthold, MD 04/23/2020 4:57 PM

## 2020-04-23 NOTE — TOC Initial Note (Signed)
Transition of Care [facility]) - Initial/Assessment Note    Patient Details  Name: Craig [name] MRN: [id number] Date of Birth: Aug 05, 1940  [facility] CM/SW Contact:    Beverly Sessions, RN Phone Number: 04/23/2020, 4:03 PM  Clinical Narrative:                 Patient admitted from home with AMS Met with patient at bedside 11/4.  He is alert to self only. Received notification from [place] [facility] that they have an open case.  They have met with patient in person and requested for face time call to complete follow up assessment. Call completed with Becky Sax [phone] and Butch Penny [phone].   Patient lives at home alone. Per DSS house is a hoarding situation.  Patient states his PCP is [facility] and his family provides transportation.  Was unable to provide any additional meaning full information from patient.  Voicemail left for daughter.    DSS request assessment for capacity.  Requested from Psych.  [place] states he will see the patient   Today spoke with son Berneta Sages.  Disccuss application process for Medicaid.  Family is going to talk tonight to see if there is any option for patient to stay with family at discharge.  They are unsure if this is an option.  TOC to follow up    PT recommending 24 hour supervision and home health        Patient Goals and CMS Choice        Expected Discharge Plan and Services     Discharge Planning Services: CM Consult   Living arrangements for the past 2 months: Single Family Home                                      Prior Living Arrangements/Services Living arrangements for the past 2 months: Single Family Home Lives with:: Self Patient language and need for interpreter reviewed:: Yes        Need for Family Participation in Patient Care: Yes (Comment) Care giver support system in place?: Yes (comment)   Criminal Activity/Legal Involvement Pertinent to Current Situation/Hospitalization: No -  Comment as needed  Activities of Daily Living Home Assistive Devices/Equipment: None ADL Screening (condition at time of admission) Patient's cognitive ability adequate to safely complete daily activities?: Yes Is the patient deaf or have difficulty hearing?: No Does the patient have difficulty seeing, even when wearing glasses/contacts?: No Does the patient have difficulty concentrating, remembering, or making decisions?: Yes Patient able to express need for assistance with ADLs?: Yes Does the patient have difficulty dressing or bathing?: No Independently performs ADLs?: Yes (appropriate for developmental age) Does the patient have difficulty walking or climbing stairs?: No Weakness of Legs: None Weakness of Arms/Hands: None  Permission Sought/Granted                  Emotional Assessment Appearance:: Appears stated age     Orientation: : Oriented to Self      Admission diagnosis:  Bradycardia [R00.1] Altered mental status [R41.82] Altered mental status, unspecified altered mental status type [R41.82] Atrial flutter, unspecified type [facility]) [I48.92] Patient Active Problem List   Diagnosis Date Noted  . Protein-calorie malnutrition, severe 04/22/2020  . Dementia ([place]) 04/21/2020  . Altered mental status 04/20/2020  . Atrial flutter ([place])   . Bradycardia   . CAD (coronary artery disease), native coronary  artery 12/25/2017  . History of coronary artery stent placement 12/25/2017  . Hyperlipidemia 12/25/2017  . Former smoker 12/25/2017  . Essential hypertension 12/25/2017  . Memory loss 12/25/2017   PCP:  [name], MD Pharmacy:   [id number]: [phone]: [phone]    Social Determinants of Health (SDOH) Interventions    Readmission Risk Interventions No flowsheet data found.

## 2020-04-23 NOTE — Progress Notes (Signed)
PROGRESS NOTE    Craig [name]  IRC:[id number] DOB: [birth year] DOA: 04/20/2020 PCP: [name], MD   Brief Narrative:  Craig [name] is a 79 y.o. 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 Presenting history is unclear.  Per ED documentation EMS were called to his residence.  He was apparently agitated but also complaining of chest and abdominal pain.  He was brought to the ED for further evaluation.  He was agitated on arrival and disoriented.  He was repetitively saying that God has given ESP per ED documentation.  He is not felt to have capacity to leave [place] therefore he was placed under IVC. Daughter was called by admitting provider and she was on aware of his hospitalization.  At baseline he is able to communicate and complete all his ADLs.  He lives alone.  Patient has an history of dementia and was prescribed Aricept but she does not think that he was taking his medications regularly. Did develop one transient episode of bradycardia while in ED, remained stable since then. Imaging which includes CT head and CT abdomen and pelvis was without any acute abnormalities.  Labs within normal limit.  Chest x-ray with mildly large cardiac silhouette without any other acute abnormality. Psych was also consulted-he does not need inpatient behavioral health services and IVC it was discontinued by psychiatrist. PT is recommending SNF placement.  Subjective: Patient has no new complaint today.  Oriented to self only but appears calm. He was talking with psychiatrist to determine the capacity to make decisions.  Assessment & Plan:   Principal Problem:   Dementia (Neponset) Active Problems:   CAD (coronary artery disease), native coronary artery   Hyperlipidemia   Essential hypertension   Memory loss   Altered mental  status   Atrial flutter (HCC)   Bradycardia   Protein-calorie malnutrition, severe  Altered mental status/dementia with behavioral disturbance/psychosis. Patient still oriented to name only.  He was much calmer when seen today. On review of previous records, he did state to his cardiologist that he thinks he has ESPN can see things coming in the future per outpatient cardiology note 10/16/2016.  Daughter states that he was on Aricept for suspected dementia but has not been taking it.  Psychiatry evaluated him today and he does not need inpatient behavioral health.  They discontinued IVC. No significant lab or imaging abnormality , B12 at 210 which is lower normal limit. -Received one-time dose of IM B12 , followed by oral supplement. -Continue home Aricept. -PT/OT evaluation -recommending home health services, patient might need long-term care due to memory issues as he lives alone. -Being evaluated by psychiatry to determine whether he is competent enough to make his own decision.  Hyponatremia.  Sodium at 129.  Patient has decreased urine osmolality, urinary sodium of 55 and serum osmolality within normal limit. -Check TSH and a.m. cortisol level. -Continue to monitor  Atrial flutter with bradycardia.  Resolved Seen by cardiology, Dr. Rockey Situ and Dr. Clayborn Bigness previously.  Was on Xarelto in the past but does not appear to have been taking it recently.  Patient had transient bradycardia down to 35 while in the ED. heart rate stable and 60s since then.  Patient remained asymptomatic. Per prior cardiology documentation he refused EP ablation in the past.  EDP discussed with on-call cardiology who recommended observation on telemetry.  Not on any rate limiting medications. -Not currently on anticoagulation, not sure he is a good candidate for anticoagulation at this time due to nonadherence and unclear cognitive status. -Continue with telemetry.  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  No official documentation available.  He is not on aspirin or statin therapy.  Currently denies any chest pain.  Troponin is negative x2.  Hypertension: Blood pressure within goal. -Continue home dose of Zestoretic.  Hyperlipidemia: Not currently on statin therapy.  Tobacco use: Reports smoking 1 pack/day.  Nicotine patch ordered.  Objective: Vitals:   04/22/20 1202 04/22/20 1919 04/23/20 0458 04/23/20 1222  BP: (!) 99/53 110/65 129/76 (!) 105/56  Pulse: (!) 55 70 60 64  Resp: 17 18 17 18   Temp: 98.2 F (36.8 C) 98.7 F (37.1 C) 97.7 F (36.5 C) 97.9 F (36.6 C)  TempSrc: Oral Oral Oral Oral  SpO2: 100% 99% 100% 100%  Weight:      Height:        Intake/Output Summary (Last 24 hours) at 04/23/2020 1540 Last data filed at 04/23/2020 1418 Gross per 24 hour  Intake 1800 ml  Output 950 ml  Net 850 ml   Filed Weights   04/20/20 1220  Weight: 81.2 kg    Examination:  General.  Well-nourished elderly man, in no acute distress. Pulmonary.  Lungs clear bilaterally, normal respiratory effort. CV.  Regular rate and rhythm, no JVD, rub or murmur. Abdomen.  Soft, nontender, nondistended, BS positive. CNS.  Alert and oriented x3.  No focal neurologic deficit. Extremities.  No edema, no cyanosis, pulses intact and symmetrical. Psychiatry.  Judgment and insight appears normal.  DVT prophylaxis: Lovenox Code Status: Full Family Communication: Call daughter with no response. Disposition Plan:  Status is: Inpatient  Dispo: The patient is from: Home              Anticipated d/c is to: To be determined              Anticipated d/c date is: 1 day              Patient currently is not medically stable to d/c.   Consultants:   EDP did a phone consult with cardiology.  Procedures:  Antimicrobials:   Data Reviewed: I have personally reviewed following labs and imaging studies  CBC: Recent Labs  Lab 04/20/20 1234 04/20/20 1549  04/21/20 0434 04/22/20 1003  WBC 3.4*  --  2.9* 3.0*  HGB 13.3  --  12.4* 12.9*  HCT 40.2 37.1* 36.7* 38.1*  MCV 95.9  --  93.4 93.8  PLT 143*  --  142* 403   Basic Metabolic Panel: Recent Labs  Lab 04/20/20 1526 04/20/20 2006 04/21/20 0434 04/22/20 0925 04/23/20 0644  NA 131*  --  131* 129* 128*  K 4.0  --  3.6 4.6 4.0  CL 97*  --  99 99 96*  CO2 25  --  25 21* 25  GLUCOSE 86  --  78 75 88  BUN 12  --  11 11 17   CREATININE 0.80  --  0.88 0.90 1.06  CALCIUM 8.7*  --  8.7* 8.6* 8.8*  MG  --  2.1  --   --   --    GFR: Estimated Creatinine Clearance: 64.1 mL/min (by C-G formula based on SCr of 1.06 mg/dL). Liver Function Tests: Recent Labs  Lab 04/20/20 1526  AST 17  ALT 15  ALKPHOS 57  BILITOT 1.0  PROT 9.0*  ALBUMIN 3.3*   Recent Labs  Lab 04/20/20 1526  LIPASE 25   No results for input(s): AMMONIA in the last 168 hours. Coagulation Profile: No results for input(s): INR, PROTIME in the last 168 hours. Cardiac Enzymes: No results for input(s): CKTOTAL, CKMB, CKMBINDEX, TROPONINI in the last 168 hours. BNP (last 3 results) No results for input(s): PROBNP in the last 8760 hours. HbA1C: No results for input(s): HGBA1C in the last 72 hours. CBG: No results for input(s): GLUCAP in the last 168 hours. Lipid Profile: No results for input(s): CHOL, HDL, LDLCALC, TRIG, CHOLHDL, LDLDIRECT in the last 72 hours. Thyroid Function Tests: Recent Labs    04/20/20 1549  TSH 2.257   Anemia Panel: Recent Labs    04/20/20 1549  VITAMINB12 210   Sepsis Labs: No results for input(s): PROCALCITON, LATICACIDVEN in the last 168 hours.  Recent Results (from the past 240 hour(s))  Respiratory Panel by RT PCR (Flu A&B, Covid) - Nasopharyngeal Swab     Status: None   Collection Time: 04/20/20  4:41 PM   Specimen: Nasopharyngeal Swab  Result Value Ref Range Status   SARS Coronavirus 2 by RT PCR NEGATIVE NEGATIVE Final    Comment: (NOTE) SARS-CoV-2 target nucleic acids  are NOT DETECTED.  The SARS-CoV-2 RNA is generally detectable in upper respiratoy specimens during the acute phase of infection. The lowest concentration of SARS-CoV-2 viral copies this assay can detect is 131 copies/mL. A negative result does not preclude SARS-Cov-2 infection and should not be used as the sole basis for treatment or other patient management decisions. A negative result may occur with  improper specimen collection/handling, submission of specimen other than nasopharyngeal swab, presence of viral mutation(s) within the areas targeted by this assay, and inadequate number of viral copies (<131 copies/mL). A negative result must be combined with clinical observations, patient history, and epidemiological information. The expected result is Negative.  Fact Sheet for Patients:  PinkCheek.be  Fact Sheet for Healthcare Providers:  GravelBags.it  This test is no t yet approved or cleared by the [place] FDA and  has been authorized for detection and/or diagnosis of SARS-CoV-2 by FDA under an Emergency Use Authorization (EUA). This EUA will remain  in effect (meaning this test can be used) for the duration of the COVID-19 declaration under Section 564(b)(1) of the Act, 21 U.S.C. section 360bbb-3(b)(1), unless the authorization is terminated or revoked sooner.     Influenza A by PCR NEGATIVE NEGATIVE Final   Influenza B by PCR NEGATIVE NEGATIVE Final    Comment: (NOTE) The Xpert Xpress SARS-CoV-2/FLU/RSV assay is intended as an aid in  the diagnosis of influenza from Nasopharyngeal swab specimens and  should not be used as a sole basis for treatment. Nasal washings and  aspirates are unacceptable for Xpert Xpress SARS-CoV-2/FLU/RSV  testing.  Fact Sheet for Patients: PinkCheek.be  Fact Sheet for Healthcare Providers: GravelBags.it  This test is not  yet approved or cleared by the [place] FDA and  has been authorized for detection and/or diagnosis of SARS-CoV-2 by  FDA under an Emergency Use Authorization (EUA). This EUA will remain  in effect (meaning this test can be used) for the duration of the  Covid-19 declaration under Section 564(b)(1) of the Act, 21  U.S.C. section 360bbb-3(b)(1), unless the authorization is  terminated or revoked. Performed at [facility], [address] [place], [place] [address]      Radiology Studies:  No results found.  Scheduled Meds: . donepezil  10 mg Oral QHS  . enoxaparin (LOVENOX) injection  40 mg Subcutaneous Q24H  . feeding supplement  237 mL Oral TID BM  . lisinopril  20 mg Oral Daily   And  . hydrochlorothiazide  25 mg Oral Daily  . melatonin  5 mg Oral QHS  . multivitamin with minerals  1 tablet Oral Daily  . nicotine  21 mg Transdermal Daily  . sodium chloride flush  3 mL Intravenous Q12H   Continuous Infusions:   LOS: 2 days   Time spent: 25 minutes.  Lorella Nimrod, MD [facility]  If 7PM-7AM, please contact night-coverage [email or web address]  04/23/2020, 3:40 PM   This record has been created using Systems analyst. Errors have been sought and corrected,but may not always be located. Such creation errors do not reflect on the standard of care.

## 2020-04-23 NOTE — Plan of Care (Signed)
Continuing with plan of care. 
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
## 2020-04-24 ENCOUNTER — Encounter: Payer: Self-pay | Admitting: Internal Medicine

## 2020-04-24 ENCOUNTER — Inpatient Hospital Stay: Payer: Medicare (Managed Care)

## 2020-04-24 LAB — BASIC METABOLIC PANEL
Anion gap: 9 (ref 5–15)
BUN: 22 mg/dL (ref 8–23)
CO2: 28 mmol/L (ref 22–32)
Calcium: 9.6 mg/dL (ref 8.9–10.3)
Chloride: 92 mmol/L — ABNORMAL LOW (ref 98–111)
Creatinine, Ser: 1.04 mg/dL (ref 0.61–1.24)
GFR, Estimated: >60 mL/min (ref >60)
Glucose, Bld: 109 mg/dL — ABNORMAL HIGH (ref 70–99)
Potassium: 3.9 mmol/L (ref 3.5–5.1)
Sodium: 129 mmol/L — ABNORMAL LOW (ref 135–145)

## 2020-04-24 LAB — CBC
HCT: 36.1 % — ABNORMAL LOW (ref 39.0–52.0)
Hemoglobin: 11.9 g/dL — ABNORMAL LOW (ref 13.0–17.0)
MCH: 31.3 pg (ref 26.0–34.0)
MCHC: 33 g/dL (ref 30.0–36.0)
MCV: 95 fL (ref 80.0–100.0)
Platelets: 171 10*3/uL (ref 150–400)
RBC: 3.8 MIL/uL — ABNORMAL LOW (ref 4.22–5.81)
RDW: 11.5 % (ref 11.5–15.5)
WBC: 3.8 10*3/uL — ABNORMAL LOW (ref 4.0–10.5)
nRBC: 0 % (ref 0.0–0.2)

## 2020-04-24 LAB — CORTISOL-AM, BLOOD: Cortisol - AM: 14.4 ug/dL (ref 6.7–22.6)

## 2020-04-24 LAB — TSH: TSH: 3.495 u[IU]/mL (ref 0.350–4.500)

## 2020-04-24 LAB — MAGNESIUM: Magnesium: 2.1 mg/dL (ref 1.7–2.4)

## 2020-04-24 LAB — PHOSPHORUS: Phosphorus: 2.3 mg/dL — ABNORMAL LOW (ref 2.5–4.6)

## 2020-04-24 MED ORDER — LORAZEPAM 1 MG PO TABS: 1.0000 mg | ORAL_TABLET | ORAL | Status: DC | PRN

## 2020-04-24 MED ORDER — THIAMINE HCL 100 MG PO TABS
100.0000 mg | ORAL_TABLET | Freq: Every day | ORAL | Status: DC
  Administered 2020-04-25 – 2020-04-26 (×2): 100 mg via ORAL
  Filled 2020-04-24 (×3): qty 1

## 2020-04-24 MED ORDER — FOLIC ACID 1 MG PO TABS
1.0000 mg | ORAL_TABLET | Freq: Every day | ORAL | Status: DC
  Administered 2020-04-25 – 2020-04-26 (×2): 1 mg via ORAL
  Filled 2020-04-24 (×3): qty 1

## 2020-04-24 MED ORDER — LORAZEPAM 2 MG/ML IJ SOLN
0.5000 mg | Freq: Four times a day (QID) | INTRAMUSCULAR | Status: DC | PRN
  Administered 2020-04-24: 0.5 mg via INTRAVENOUS
  Filled 2020-04-24: qty 1

## 2020-04-24 MED ORDER — ADULT MULTIVITAMIN W/MINERALS CH: 1.0000 | ORAL_TABLET | Freq: Every day | ORAL | Status: DC

## 2020-04-24 MED ORDER — IOHEXOL 300 MG/ML  SOLN
75.0000 mL | Freq: Once | INTRAMUSCULAR | Status: AC | PRN
  Administered 2020-04-24: 75 mL via INTRAVENOUS

## 2020-04-24 MED ORDER — LORAZEPAM 2 MG/ML IJ SOLN
1.0000 mg | INTRAMUSCULAR | Status: DC | PRN
  Administered 2020-04-24: 1 mg via INTRAVENOUS
  Filled 2020-04-24: qty 1

## 2020-04-24 MED ORDER — HALOPERIDOL LACTATE 5 MG/ML IJ SOLN
1.0000 mg | Freq: Four times a day (QID) | INTRAMUSCULAR | Status: DC | PRN
  Administered 2020-04-24: 1 mg via INTRAVENOUS
  Filled 2020-04-24: qty 1

## 2020-04-24 MED ORDER — THIAMINE HCL 100 MG/ML IJ SOLN
100.0000 mg | Freq: Every day | INTRAMUSCULAR | Status: DC
  Administered 2020-04-24: 100 mg via INTRAVENOUS
  Filled 2020-04-24: qty 2

## 2020-04-24 NOTE — Progress Notes (Signed)
PROGRESS NOTE    Craig [name]  KKX:[id number] DOB: 1940/07/17 DOA: 04/20/2020 PCP: [name], MD   Brief Narrative:  Craig [name] is a 79 y.o. 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 Presenting history is unclear.  Per ED documentation EMS were called to his residence.  He was apparently agitated but also complaining of chest and abdominal pain.  He was brought to the ED for further evaluation.  He was agitated on arrival and disoriented.  He was repetitively saying that Craig [name] has given ESP per ED documentation.  He is not felt to have capacity to leave [place] therefore he was placed under IVC. Daughter was called by admitting provider and she was on aware of his hospitalization.  At baseline he is able to communicate and complete all his ADLs.  He lives alone.  Patient has an history of dementia and was prescribed Aricept but she does not think that he was taking his medications regularly. Did develop one transient episode of bradycardia while in ED, remained stable since then. Imaging which includes CT head and CT abdomen and pelvis was without any acute abnormalities.  Labs within normal limit.  Chest x-ray with mildly large cardiac silhouette without any other acute abnormality. Psych was also consulted-he does not need inpatient behavioral health services and IVC it was discontinued by psychiatrist. PT is recommending SNF placement.  Subjective: Patient has no new complaint today.  He wants to go home.  APS is involved and not a safe discharge at this time.  His children are taking time to decide about the future plan.  Assessment & Plan:   Principal Problem:   Dementia ([place]) Active Problems:   CAD (coronary artery disease), native coronary artery   Hyperlipidemia   Essential hypertension   Memory loss    Altered mental status   Atrial flutter (HCC)   Bradycardia   Protein-calorie malnutrition, severe  Altered mental status/dementia with behavioral disturbance/psychosis. Patient still oriented to name only.  He was much calmer when seen today. On review of previous records, he did state to his cardiologist that he thinks he has ESPN can see things coming in the future per outpatient cardiology note 10/16/2016.  Daughter states that he was on Aricept for suspected dementia but has not been taking it.  Psychiatry evaluated him today and he does not need inpatient behavioral health.  They discontinued IVC. No significant lab or imaging abnormality , B12 at 210 which is lower normal limit. -Received one-time dose of IM B12 , followed by oral supplement. -Continue home Aricept. -PT/OT evaluation -recommending home health services, patient might need long-term care due to memory issues as he lives alone. -Being evaluated by psychiatry and he has capacity to make his own decisions but might not be safe staying alone. -Talked with daughter that they should come up with a plan to help him and they can try putting him in a memory care unit if that is what we decide but that should be done with the help of social worker and primary care provider as an outpatient.  Hyponatremia.  Sodium at 129.  Patient has decreased urine osmolality, urinary sodium of 55 and serum osmolality within normal limit. -Check TSH and a.m. cortisol -are within normal limit. 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 -Continue to monitor  Atrial flutter with bradycardia.  Resolved Seen by cardiology, Dr. Rockey Situ and Dr. Clayborn Bigness previously.  Was on Xarelto in the past but does not appear to have been taking it recently.  Patient had transient bradycardia down to 35 while in the ED. heart rate stable and 60s since then.  Patient remained asymptomatic. Per prior cardiology documentation he  refused EP ablation in the past.  EDP discussed with on-call cardiology who recommended observation on telemetry.  Not on any rate limiting medications. -Not currently on anticoagulation, not sure he is a good candidate for anticoagulation at this time due to nonadherence and unclear cognitive status. -Continue with telemetry.  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  No official documentation available.  He is not on aspirin or statin therapy.  Currently denies any chest pain.  Troponin is negative x2.  Hypertension: Blood pressure within goal. -Continue home dose of Zestoretic.  Hyperlipidemia: Not currently on statin therapy.  Tobacco use: Reports smoking 1 pack/day.  Nicotine patch ordered.  Objective: Vitals:   04/24/20 0405 04/24/20 0905 04/24/20 1115 04/24/20 1524  BP: (!) 145/86 120/76 103/71 117/65  Pulse: 80 60 97 61  Resp: 20 16 16 18   Temp: 97.6 F (36.4 C) 98 F (36.7 C) 97.7 F (36.5 C) 97.7 F (36.5 C)  TempSrc: Oral Oral Oral Oral  SpO2: 100% 100% 100% 100%  Weight:      Height:        Intake/Output Summary (Last 24 hours) at 04/24/2020 1541 Last data filed at 04/24/2020 0800 Gross per 24 hour  Intake 480 ml  Output 850 ml  Net -370 ml   Filed Weights   04/20/20 1220  Weight: 81.2 kg    Examination:  General.  Well-nourished elderly man, in no acute distress. Pulmonary.  Lungs clear bilaterally, normal respiratory effort. CV.  Regular rate and rhythm, no JVD, rub or murmur. Abdomen.  Soft, nontender, nondistended, BS positive. CNS.  Alert and oriented x3.  No focal neurologic deficit. Extremities.  No edema, no cyanosis, pulses intact and symmetrical. Psychiatry.  Judgment and insight appears normal.  DVT prophylaxis: Lovenox Code Status: Full Family Communication: Talk with daughter on phone, she will talk with other siblings to come up with a plan how they can help him. Disposition Plan:   Status is: Inpatient  Dispo: The patient is from: Home              Anticipated d/c is to: To be determined              Anticipated d/c date is: 1 day              Patient currently is medically stable.  Unsafe discharge.   Consultants:   EDP did a phone consult with cardiology.  Procedures:  Antimicrobials:   Data Reviewed: I have personally reviewed following labs and imaging studies  CBC: Recent Labs  Lab 04/20/20 1234 04/20/20 1549 04/21/20 0434 04/22/20 1003  WBC 3.4*  --  2.9* 3.0*  HGB 13.3  --  12.4* 12.9*  HCT 40.2 37.1* 36.7* 38.1*  MCV 95.9  --  93.4 93.8  PLT 143*  --  142* 242   Basic Metabolic Panel: Recent Labs  Lab 04/20/20 1526 04/20/20 2006 04/21/20 0434 04/22/20 0925 04/23/20 0644 04/24/20 0416  NA 131*  --  131* 129* 128* 129*  K 4.0  --  3.6 4.6 4.0  3.9  CL 97*  --  99 99 96* 92*  CO2 25  --  25 21* 25 28  GLUCOSE 86  --  78 75 88 109*  BUN 12  --  11 11 17 22   CREATININE 0.80  --  0.88 0.90 1.06 1.04  CALCIUM 8.7*  --  8.7* 8.6* 8.8* 9.6  MG  --  2.1  --   --   --   --    GFR: Estimated Creatinine Clearance: 65.3 mL/min (by C-G formula based on SCr of 1.04 mg/dL). Liver Function Tests: Recent Labs  Lab 04/20/20 1526  AST 17  ALT 15  ALKPHOS 57  BILITOT 1.0  PROT 9.0*  ALBUMIN 3.3*   Recent Labs  Lab 04/20/20 1526  LIPASE 25   No results for input(s): AMMONIA in the last 168 hours. Coagulation Profile: No results for input(s): INR, PROTIME in the last 168 hours. Cardiac Enzymes: No results for input(s): CKTOTAL, CKMB, CKMBINDEX, TROPONINI in the last 168 hours. BNP (last 3 results) No results for input(s): PROBNP in the last 8760 hours. HbA1C: No results for input(s): HGBA1C in the last 72 hours. CBG: No results for input(s): GLUCAP in the last 168 hours. Lipid Profile: No results for input(s): CHOL, HDL, LDLCALC, TRIG, CHOLHDL, LDLDIRECT in the last 72 hours. Thyroid Function Tests: Recent Labs     04/24/20 0416  TSH 3.495   Anemia Panel: No results for input(s): VITAMINB12, FOLATE, FERRITIN, TIBC, IRON, RETICCTPCT in the last 72 hours. Sepsis Labs: No results for input(s): PROCALCITON, LATICACIDVEN in the last 168 hours.  Recent Results (from the past 240 hour(s))  Respiratory Panel by RT PCR (Flu A&B, Covid) - Nasopharyngeal Swab     Status: None   Collection Time: 04/20/20  4:41 PM   Specimen: Nasopharyngeal Swab  Result Value Ref Range Status   SARS Coronavirus 2 by RT PCR NEGATIVE NEGATIVE Final    Comment: (NOTE) SARS-CoV-2 target nucleic acids are NOT DETECTED.  The SARS-CoV-2 RNA is generally detectable in upper respiratoy specimens during the acute phase of infection. The lowest concentration of SARS-CoV-2 viral copies this assay can detect is 131 copies/mL. A negative result does not preclude SARS-Cov-2 infection and should not be used as the sole basis for treatment or other patient management decisions. A negative result may occur with  improper specimen collection/handling, submission of specimen other than nasopharyngeal swab, presence of viral mutation(s) within the areas targeted by this assay, and inadequate number of viral copies (<131 copies/mL). A negative result must be combined with clinical observations, patient history, and epidemiological information. The expected result is Negative.  Fact Sheet for Patients:  PinkCheek.be  Fact Sheet for Healthcare Providers:  GravelBags.it  This test is no t yet approved or cleared by the [place] FDA and  has been authorized for detection and/or diagnosis of SARS-CoV-2 by FDA under an Emergency Use Authorization (EUA). This EUA will remain  in effect (meaning this test can be used) for the duration of the COVID-19 declaration under Section 564(b)(1) of the Act, 21 U.S.C. section 360bbb-3(b)(1), unless the authorization is terminated or revoked  sooner.     Influenza A by PCR NEGATIVE NEGATIVE Final   Influenza B by PCR NEGATIVE NEGATIVE Final    Comment: (NOTE) The Xpert Xpress SARS-CoV-2/FLU/RSV assay is intended as an aid in  the diagnosis of influenza from Nasopharyngeal swab specimens and  should not be used as a sole basis for treatment. Nasal washings and  aspirates are unacceptable for Xpert Xpress SARS-CoV-2/FLU/RSV  testing.  Fact Sheet for Patients: PinkCheek.be  Fact Sheet for Healthcare Providers: GravelBags.it  This test is not yet approved or cleared by the [place] FDA and  has been authorized for detection and/or diagnosis of SARS-CoV-2 by  FDA under an Emergency Use Authorization (EUA). This EUA will remain  in effect (meaning this test can be used) for the duration of the  Covid-19 declaration under Section 564(b)(1) of the Act, 21  U.S.C. section 360bbb-3(b)(1), unless the authorization is  terminated or revoked. Performed at [facility], [address], [place], [place] [address]      Radiology Studies: No results found.  Scheduled Meds: . donepezil  10 mg Oral QHS  . enoxaparin (LOVENOX) injection  40 mg Subcutaneous Q24H  . feeding supplement  237 mL Oral TID BM  . lisinopril  20 mg Oral Daily   And  . hydrochlorothiazide  25 mg Oral Daily  . melatonin  5 mg Oral QHS  . multivitamin with minerals  1 tablet Oral Daily  . nicotine  21 mg Transdermal Daily  . sodium chloride flush  3 mL Intravenous Q12H   Continuous Infusions:   LOS: 3 days   Time spent: 25 minutes.  Lorella Nimrod, MD [facility]  If 7PM-7AM, please contact night-coverage [email or web address]  04/24/2020, 3:41 PM   This record has been created using Systems analyst. Errors have been sought and corrected,but may not always be located. Such creation errors do not reflect on the standard of care.

## 2020-04-24 NOTE — Plan of Care (Signed)
Continuing with plan of care. 

## 2020-04-24 NOTE — Progress Notes (Signed)
Throughout the day patient has pulled out his IV and on many occasions walked out of the room uncertain as to where he was actually going.  Tele-sitter was attempted, but unsuccessful.  1:1 sitter is ordered.  Dr. Reesa Chew aware and also ordered ativan and haldol.

## 2020-04-25 NOTE — Plan of Care (Signed)
Continuing with plan of care. 

## 2020-04-25 NOTE — Progress Notes (Signed)
PT Cancellation Note  Patient Details Name: Craig [name] MRN: [id number] DOB: 1940/07/25   Cancelled Treatment:    Reason Eval/Treat Not Completed: Other (comment)  Pt in bed.  Attempted to get pt to participate multiple ways but pt refused all attempts.  Unable to persuade pt.  Will continue as appropriate.  Chesley Noon 04/25/2020, 11:57 AM

## 2020-04-25 NOTE — Progress Notes (Addendum)
PROGRESS NOTE    Craig [name]  DXI:[id number] DOB: May 23, 1941 DOA: 04/20/2020 PCP: [name], MD   Brief Narrative:  Craig [name] is a 79 y.o. 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 Presenting history is unclear.  Per ED documentation EMS were called to his residence.  He was apparently agitated but also complaining of chest and abdominal pain.  He was brought to the ED for further evaluation.  He was agitated on arrival and disoriented.  He was repetitively saying that God has given ESP per ED documentation.  He is not felt to have capacity to leave [place] therefore he was placed under IVC. Daughter was called by admitting provider and she was on aware of his hospitalization.  At baseline he is able to communicate and complete all his ADLs.  He lives alone.  Patient has an history of dementia and was prescribed Aricept but she does not think that he was taking his medications regularly. Did develop one transient episode of bradycardia while in ED, remained stable since then. Imaging which includes CT head and CT abdomen and pelvis was without any acute abnormalities.  Labs within normal limit.  Chest x-ray with mildly large cardiac silhouette without any other acute abnormality. Psych was also consulted-he does not need inpatient behavioral health services and IVC it was discontinued by psychiatrist. PT is recommending SNF placement.  Subjective: Patient was sleeping comfortably when seen today.  Pending nursing staff he had a rough night.  No other nursing concern. He did become agitated yesterday.  Assessment & Plan:   Principal Problem:   Dementia ([place]) Active Problems:   CAD (coronary artery disease), native coronary artery   Hyperlipidemia   Essential hypertension   Memory loss   Altered mental  status   Atrial flutter (HCC)   Bradycardia   Protein-calorie malnutrition, severe  Altered mental status/dementia with behavioral disturbance/psychosis. Patient still oriented to name only.  He was much calmer when seen today. On review of previous records, he did state to his cardiologist that he thinks he has ESPN can see things coming in the future per outpatient cardiology note 10/16/2016.  Daughter states that he was on Aricept for suspected dementia but has not been taking it.  Psychiatry evaluated him today and he does not need inpatient behavioral health.  They discontinued IVC. No significant lab or imaging abnormality , B12 at 210 which is lower normal limit. -Received one-time dose of IM B12 , followed by oral supplement. -Continue home Aricept. -PT/OT evaluation -recommending home health services, patient might need long-term care due to memory issues as he lives alone. -Being evaluated by psychiatry and he has capacity to make his own decisions but might not be safe staying alone. -Talked with daughter that they should come up with a plan to help him and they can try putting him in a memory care unit if that is what we decide but that should be done with the help of social worker and primary care provider as an outpatient.  Agitation.  Patient becomes quite agitated yesterday requiring medications.  Not sure about his alcohol use as he is unable to answer properly.  Might be some element of delirium and sundowning as he has underlying dementia. -He was placed on CIWA protocol-currently score 0. -Delirium precautions.  Hyponatremia.  Sodium at 129.  Patient has  decreased urine osmolality, urinary sodium of 55 and serum osmolality within normal limit. -Check TSH and a.m. cortisol -are within normal limit. -CT chest without any significant abnormality, a small right upper lobe nodule which will required follow-up only in high risk patients. -Continue to monitor  Atrial flutter with  bradycardia.  Resolved Seen by cardiology, Dr. Rockey Situ and Dr. Clayborn Bigness previously.  Was on Xarelto in the past but does not appear to have been taking it recently.  Patient had transient bradycardia down to 35 while in the ED. heart rate stable and 60s since then.  Patient remained asymptomatic. Per prior cardiology documentation he refused EP ablation in the past.  EDP discussed with on-call cardiology who recommended observation on telemetry.  Not on any rate limiting medications. -Not currently on anticoagulation, not sure he is a good candidate for anticoagulation at this time due to nonadherence and unclear cognitive status. -Continue with telemetry.  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  No official documentation available.  He is not on aspirin or statin therapy.  Currently denies any chest pain.  Troponin is negative x2.  Hypertension: Blood pressure within goal. -Continue home dose of Zestoretic.  Hyperlipidemia: Not currently on statin therapy.  Tobacco use: Reports smoking 1 pack/day.  Nicotine patch ordered.  Objective: Vitals:   04/24/20 1524 04/24/20 2012 04/25/20 0500 04/25/20 1133  BP: 117/65 137/72 139/85 121/73  Pulse: 61 61  65  Resp: 18 18 18 14   Temp: 97.7 F (36.5 C) 98 F (36.7 C) (!) 97.5 F (36.4 C) 98.5 F (36.9 C)  TempSrc: Oral Oral Oral Oral  SpO2: 100% 100% 100% 100%  Weight:      Height:        Intake/Output Summary (Last 24 hours) at 04/25/2020 1449 Last data filed at 04/25/2020 0737 Gross per 24 hour  Intake --  Output 150 ml  Net -150 ml   Filed Weights   04/20/20 1220  Weight: 81.2 kg    Examination:  General.  Elderly man, sleeping comfortably, in no acute distress. Pulmonary.  Lungs clear bilaterally, normal respiratory effort. CV.  Regular rate and rhythm, no JVD, rub or murmur. Abdomen.  Soft, nontender, nondistended, BS positive. CNS.  Alert and oriented x3.  No focal  neurologic deficit. Extremities.  No edema, no cyanosis, pulses intact and symmetrical. Psychiatry.  Judgment and insight appears normal.  DVT prophylaxis: Lovenox Code Status: Full Family Communication: Call daughter with no response.  Later able to talk with son when he was visiting, he will discuss with other sibling and I told him that we need some plan by tomorrow.  He was asking about the psychiatrist who made the evaluation, name was provided.  He wants to talk with that psychiatrist himself too. Disposition Plan:  Status is: Inpatient  Dispo: The patient is from: Home              Anticipated d/c is to: To be determined              Anticipated d/c date is: 1 day              Patient currently is medically stable.  Unsafe discharge.   Consultants:   EDP did a phone consult with cardiology.  Procedures:  Antimicrobials:   Data Reviewed: I have personally reviewed following labs and imaging studies  CBC: Recent Labs  Lab 04/20/20 1234 04/20/20 1549 04/21/20 0434 04/22/20 1003 04/24/20  1923  WBC 3.4*  --  2.9* 3.0* 3.8*  HGB 13.3  --  12.4* 12.9* 11.9*  HCT 40.2 37.1* 36.7* 38.1* 36.1*  MCV 95.9  --  93.4 93.8 95.0  PLT 143*  --  142* 155 573   Basic Metabolic Panel: Recent Labs  Lab 04/20/20 1526 04/20/20 2006 04/21/20 0434 04/22/20 0925 04/23/20 0644 04/24/20 0416 04/24/20 1923  NA 131*  --  131* 129* 128* 129*  --   K 4.0  --  3.6 4.6 4.0 3.9  --   CL 97*  --  99 99 96* 92*  --   CO2 25  --  25 21* 25 28  --   GLUCOSE 86  --  78 75 88 109*  --   BUN 12  --  11 11 17 22   --   CREATININE 0.80  --  0.88 0.90 1.06 1.04  --   CALCIUM 8.7*  --  8.7* 8.6* 8.8* 9.6  --   MG  --  2.1  --   --   --   --  2.1  PHOS  --   --   --   --   --   --  2.3*   GFR: Estimated Creatinine Clearance: 65.3 mL/min (by C-G formula based on SCr of 1.04 mg/dL). Liver Function Tests: Recent Labs  Lab 04/20/20 1526  AST 17  ALT 15  ALKPHOS 57  BILITOT 1.0  PROT 9.0*   ALBUMIN 3.3*   Recent Labs  Lab 04/20/20 1526  LIPASE 25   No results for input(s): AMMONIA in the last 168 hours. Coagulation Profile: No results for input(s): INR, PROTIME in the last 168 hours. Cardiac Enzymes: No results for input(s): CKTOTAL, CKMB, CKMBINDEX, TROPONINI in the last 168 hours. BNP (last 3 results) No results for input(s): PROBNP in the last 8760 hours. HbA1C: No results for input(s): HGBA1C in the last 72 hours. CBG: No results for input(s): GLUCAP in the last 168 hours. Lipid Profile: No results for input(s): CHOL, HDL, LDLCALC, TRIG, CHOLHDL, LDLDIRECT in the last 72 hours. Thyroid Function Tests: Recent Labs    04/24/20 0416  TSH 3.495   Anemia Panel: No results for input(s): VITAMINB12, FOLATE, FERRITIN, TIBC, IRON, RETICCTPCT in the last 72 hours. Sepsis Labs: No results for input(s): PROCALCITON, LATICACIDVEN in the last 168 hours.  Recent Results (from the past 240 hour(s))  Respiratory Panel by RT PCR (Flu A&B, Covid) - Nasopharyngeal Swab     Status: None   Collection Time: 04/20/20  4:41 PM   Specimen: Nasopharyngeal Swab  Result Value Ref Range Status   SARS Coronavirus 2 by RT PCR NEGATIVE NEGATIVE Final    Comment: (NOTE) SARS-CoV-2 target nucleic acids are NOT DETECTED.  The SARS-CoV-2 RNA is generally detectable in upper respiratoy specimens during the acute phase of infection. The lowest concentration of SARS-CoV-2 viral copies this assay can detect is 131 copies/mL. A negative result does not preclude SARS-Cov-2 infection and should not be used as the sole basis for treatment or other patient management decisions. A negative result may occur with  improper specimen collection/handling, submission of specimen other than nasopharyngeal swab, presence of viral mutation(s) within the areas targeted by this assay, and inadequate number of viral copies (<131 copies/mL). A negative result must be combined with clinical observations,  patient history, and epidemiological information. The expected result is Negative.  Fact Sheet for Patients:  PinkCheek.be  Fact Sheet for Healthcare Providers:  GravelBags.it  This test is no t yet approved or cleared by the [place] and  has been authorized for detection and/or diagnosis of SARS-CoV-2 by FDA under an Emergency Use Authorization (EUA). This EUA will remain  in effect (meaning this test can be used) for the duration of the COVID-19 declaration under Section 564(b)(1) of the Act, 21 U.S.C. section 360bbb-3(b)(1), unless the authorization is terminated or revoked sooner.     Influenza A by PCR NEGATIVE NEGATIVE Final   Influenza B by PCR NEGATIVE NEGATIVE Final    Comment: (NOTE) The Xpert Xpress SARS-CoV-2/FLU/RSV assay is intended as an aid in  the diagnosis of influenza from Nasopharyngeal swab specimens and  should not be used as a sole basis for treatment. Nasal washings and  aspirates are unacceptable for Xpert Xpress SARS-CoV-2/FLU/RSV  testing.  Fact Sheet for Patients: PinkCheek.be  Fact Sheet for Healthcare Providers: GravelBags.it  This test is not yet approved or cleared by the [place] FDA and  has been authorized for detection and/or diagnosis of SARS-CoV-2 by  FDA under an Emergency Use Authorization (EUA). This EUA will remain  in effect (meaning this test can be used) for the duration of the  Covid-19 declaration under Section 564(b)(1) of the Act, 21  U.S.C. section 360bbb-3(b)(1), unless the authorization is  terminated or revoked. Performed at [facility], [address] [place], [place] [address]      Radiology Studies: CT CHEST W CONTRAST  Result Date: 04/24/2020 CLINICAL DATA:  Persistent cough. EXAM: CT CHEST WITH CONTRAST TECHNIQUE: Multidetector CT imaging of the chest was performed during  intravenous contrast administration. CONTRAST:  78mL OMNIPAQUE IOHEXOL 300 MG/ML  SOLN COMPARISON:  Radiograph 04/20/2020 FINDINGS: Cardiovascular: Atherosclerosis of the thoracic aorta. No aortic dissection. No aortic aneurysm. No filling defects in the central pulmonary arteries to the proximal segmental level to suggest pulmonary embolus. Mild cardiomegaly. There are coronary artery calcifications. There is a small pericardial effusion with fluid tracking into the superior pericardial recess. Mediastinum/Nodes: No enlarged mediastinal or hilar lymph nodes. Patulous upper esophagus without wall thickening. There is no thyroid nodule. Lungs/Pleura: Mild apical predominant emphysema with mild central bronchial thickening. Minimal focal bronchiectasis with adjacent linear opacity in the medial right middle lobe. Linear atelectasis in the lingula typical atelectasis/scar. No confluent consolidation or evidence of pneumonia. There is no pulmonary mass. Tiny right upper lobe nodule measures 4 mm, series 3, image 30. no findings of pulmonary edema. There is no pleural fluid. Upper Abdomen: No acute or unexpected findings. Moderate stool in the included left colon. Musculoskeletal: There are no acute or suspicious osseous abnormalities. Remote left posterior rib fractures. There are innumerable fat density lobules throughout the skin and subcutaneous tissues of the torso, of unknown but doubtful clinical significance. Mild generalized subcutaneous edema. IMPRESSION: 1. No evidence of pneumonia. 2. Mild cardiomegaly with coronary artery calcifications. Small pericardial effusion. 3. Tiny right upper lobe nodule measures 4 mm. In a low risk patient, no further follow-up is needed. In a high-risk patient, optional CT could be considered in 12 months. 4. Mild emphysema and bronchial thickening. Aortic Atherosclerosis (ICD10-I70.0) and Emphysema (ICD10-J43.9). Electronically Signed   By: Keith Rake M.D.   On: 04/24/2020  19:13    Scheduled Meds: . donepezil  10 mg Oral QHS  . enoxaparin (LOVENOX) injection  40 mg Subcutaneous Q24H  . feeding supplement  237 mL Oral TID BM  . folic acid  1 mg Oral Daily  . lisinopril  20 mg  Oral Daily   And  . hydrochlorothiazide  25 mg Oral Daily  . melatonin  5 mg Oral QHS  . multivitamin with minerals  1 tablet Oral Daily  . nicotine  21 mg Transdermal Daily  . sodium chloride flush  3 mL Intravenous Q12H  . thiamine  100 mg Oral Daily   Or  . thiamine  100 mg Intravenous Daily   Continuous Infusions:   LOS: 4 days   Time spent: 25 minutes.  Lorella Nimrod, MD [facility]  If 7PM-7AM, please contact night-coverage [email or web address]  04/25/2020, 2:49 PM   This record has been created using Systems analyst. Errors have been sought and corrected,but may not always be located. Such creation errors do not reflect on the standard of care.

## 2020-04-26 DIAGNOSIS — I10 Essential (primary) hypertension: Secondary | ICD-10-CM

## 2020-04-26 DIAGNOSIS — E43 Unspecified severe protein-calorie malnutrition: Secondary | ICD-10-CM

## 2020-04-26 DIAGNOSIS — F0280 Dementia in other diseases classified elsewhere without behavioral disturbance: Secondary | ICD-10-CM

## 2020-04-26 DIAGNOSIS — G301 Alzheimer's disease with late onset: Secondary | ICD-10-CM

## 2020-04-26 LAB — CBC
HCT: 36.4 % — ABNORMAL LOW (ref 39.0–52.0)
Hemoglobin: 12.1 g/dL — ABNORMAL LOW (ref 13.0–17.0)
MCH: 31.6 pg (ref 26.0–34.0)
MCHC: 33.2 g/dL (ref 30.0–36.0)
MCV: 95 fL (ref 80.0–100.0)
Platelets: 167 10*3/uL (ref 150–400)
RBC: 3.83 MIL/uL — ABNORMAL LOW (ref 4.22–5.81)
RDW: 11.8 % (ref 11.5–15.5)
WBC: 3.1 10*3/uL — ABNORMAL LOW (ref 4.0–10.5)
nRBC: 0 % (ref 0.0–0.2)

## 2020-04-26 LAB — BASIC METABOLIC PANEL
Anion gap: 6 (ref 5–15)
BUN: 27 mg/dL — ABNORMAL HIGH (ref 8–23)
CO2: 28 mmol/L (ref 22–32)
Calcium: 8.9 mg/dL (ref 8.9–10.3)
Chloride: 95 mmol/L — ABNORMAL LOW (ref 98–111)
Creatinine, Ser: 1.02 mg/dL (ref 0.61–1.24)
GFR, Estimated: >60 mL/min (ref >60)
Glucose, Bld: 82 mg/dL (ref 70–99)
Potassium: 4.2 mmol/L (ref 3.5–5.1)
Sodium: 129 mmol/L — ABNORMAL LOW (ref 135–145)

## 2020-04-26 LAB — URINALYSIS, COMPLETE (UACMP) WITH MICROSCOPIC
Bacteria, UA: NONE SEEN
Bilirubin Urine: NEGATIVE
Glucose, UA: NEGATIVE mg/dL
Hgb urine dipstick: NEGATIVE
Ketones, ur: NEGATIVE mg/dL
Leukocytes,Ua: NEGATIVE
Nitrite: NEGATIVE
Protein, ur: NEGATIVE mg/dL
Specific Gravity, Urine: 1.012 (ref 1.005–1.030)
Squamous Epithelial / HPF: NONE SEEN (ref 0–5)
pH: 7 (ref 5.0–8.0)

## 2020-04-26 MED ORDER — SODIUM CHLORIDE 1 G PO TABS
1.0000 g | ORAL_TABLET | Freq: Two times a day (BID) | ORAL | Status: DC
  Filled 2020-04-26: qty 1

## 2020-04-26 MED ORDER — LISINOPRIL 20 MG PO TABS: 20.0000 mg | ORAL_TABLET | Freq: Every day | ORAL | 0 refills | Status: DC

## 2020-04-26 MED ORDER — ADULT MULTIVITAMIN W/MINERALS CH: 1.0000 | ORAL_TABLET | Freq: Every day | ORAL | 0 refills | Status: AC

## 2020-04-26 MED ORDER — ENSURE ENLIVE PO LIQD: 237.0000 mL | Freq: Three times a day (TID) | ORAL | 12 refills | Status: AC

## 2020-04-26 NOTE — TOC Transition Note (Signed)
Transition of Care [facility]) - CM/SW Discharge Note   Patient Details  Name: Craig [name] MRN: [id number] Date of Birth: [birth year]  Transition of [facility] CM/SW Contact:  Beverly Sessions, RN Phone Number: 04/26/2020, 4:17 PM   Clinical Narrative:     Patient to discharge home today Psych has determined that patient has capacity.  Patient confirms he wishes to return home  Sonja with DSS notified and faxed psych note to [phone]  Son will pick up at discharge today.  Son and patient in agreement to home health services.  State they do not have a preference of home health agency.  Referral made to [facility] with [place] inquired if son and his sister had discussed if anyone would be staying with the patient at discharge.  Response was "yes we have talked, and there is a plan".  MD and DSS aware  Son states that his sister has initiated the process of applying for medicaid    Final next level of care: [place] Barriers to Discharge: No Barriers Identified   Patient Goals and CMS Choice        Discharge Placement                       Discharge Plan and Services   Discharge Planning Services: CM Consult                      HH Arranged: RN, PT, OT, Nurse's Aide, Social Work [employer] Agency: [place]) Date [place] I: 04/26/20   Representative spoke with at [place]: [place] ([place]) Interventions     Readmission Risk Interventions No flowsheet data found.

## 2020-04-26 NOTE — Progress Notes (Signed)
Patient assisted to get dressed/cleaned up by son Berneta Sages who is taking patient home.  Reviewed AVS, medications.  No follow up appointments, recommended follow up with PCP as needed. Son verbalized understandings of directions, provided copy of AVS.  Vitals stable, assessment stable. Alert oriented to self and place.  Steady gait.  Belongings returned.  To receive belongings from [place] upon leaving.  Volunteer services called to transport patient via wheelchair, IV removed.

## 2020-04-26 NOTE — Progress Notes (Signed)
Occupational Therapy Treatment Patient Details Name: Craig [name] MRN: [id number] DOB: [birth year] Today's Date: 04/26/2020    History of present illness 79 y.o. 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  History is limited from patient due to altered mental status and is otherwise supplemented by EDP, chart review, and daughter by phone. Presenting history is unclear.  Per ED documentation EMS were called to his residence.  He was apparently agitated but also complaining of chest and abdominal pain.  He was brought to the ED for further evaluation.  He was agitated on arrival and disoriented.  He was repetitively saying that God has given ESP per ED documentation.  He is not felt to have capacity to leave [place] therefore he was placed under IVC.   OT comments  Pt seated in bed and finishing breakfast as therapist enters the room. Pt agreeable to OT intervention and is overall very pleasant and agreeable during session. Pt declined OOB activities and even with encouragement pt politely declined functional transfers and mobility within the room. Pt is agreeable to B UE strengthening with use of resistive theraband. Pt performs 2 sets of 10 chest pulls, shoulder diagonals,  alternating punches, and shoulder elevation with min cuing for proper technique. Pt remained in bed at end of session with all needs within reach. Pt continues to benefit from OT intervention. OT discussed need for Pt to have 24/7 supervision at home for safety needs and pt is agreeable and reports, " That sounds good to me". Pt can return home at discharge if he has 24/7 supervision but if no he will need higher level of care for safety with occupational performance.   Follow Up Recommendations  Supervision/Assistance - 24 hour;Home health OT    Equipment  Recommendations  None recommended by OT       Precautions / Restrictions Precautions Precautions: Fall Precaution Comments: IVC       Mobility Bed Mobility Overal bed mobility: Modified Independent Bed Mobility: Supine to Sit;Sit to Supine     Supine to sit: Supervision Sit to supine: Supervision      Transfers     General transfer comment: pt declined and requesting to remain in bed    Balance Overall balance assessment: Needs assistance Sitting-balance support: Feet unsupported Sitting balance-Leahy Scale: Good              ADL either performed or assessed with clinical judgement   ADL Overall ADL's : Needs assistance/impaired     Grooming: Wash/dry hands;Wash/dry face;Sitting;Set up       Vision Patient Visual Report: No change from baseline            Cognition Arousal/Alertness: Awake/alert Behavior During Therapy: WFL for tasks assessed/performed Overall Cognitive Status: History of cognitive impairments - at baseline      General Comments: Pt with hx of dementia. Pt very pleasant and cooperative with tasks this session.                   Pertinent Vitals/ Pain       Pain Assessment: No/denies pain         Frequency  Min 1X/week        Progress Toward Goals  OT Goals(current goals can now be found in the care plan section)  Progress towards OT goals: Progressing toward goals  Acute Rehab OT  Goals Patient Stated Goal: to get better and stronger OT Goal Formulation: With patient Time For Goal Achievement: 05/05/20 Potential to Achieve Goals: Good  Plan Discharge plan needs to be updated       AM-PAC OT "6 Clicks" Daily Activity     Outcome Measure   Help from another person eating meals?: None Help from another person taking care of personal grooming?: A Little Help from another person toileting, which includes using toliet, bedpan, or urinal?: A Little Help from another person bathing (including washing, rinsing,  drying)?: A Little Help from another person to put on and taking off regular upper body clothing?: None Help from another person to put on and taking off regular lower body clothing?: A Little 6 Click Score: 20    End of Session    OT Visit Diagnosis: Unsteadiness on feet (R26.81);History of falling (Z91.81)   Activity Tolerance Patient tolerated treatment well   Patient Left in bed;with call bell/phone within reach;with bed alarm set   Nurse Communication Mobility status;Precautions;Other (comment) (discussed behaviors with NT and how pt responds to staff in room)        Time: [id number] OT Time Calculation (min): 24 min  Charges: OT General Charges $OT Visit: 1 Visit OT Treatments $Therapeutic Exercise: 23-37 mins  Darleen Crocker, MS, OTR/L , CBIS ascom [phone]  04/26/20, 9:55 AM

## 2020-04-26 NOTE — Discharge Summary (Addendum)
Physician Discharge Summary  Craig [name] RCV:[id number] DOB: 1940/11/13 DOA: 04/20/2020  PCP: [name], MD  Admit date: 04/20/2020 Discharge date: 04/26/2020  Admitted From: Home Disposition: Home  Recommendations for Outpatient Follow-up:  1. Follow up with PCP in 1-2 weeks 2. Please obtain BMP/CBC in one week 3. Please follow up on the following pending results: None  Home Health: Yes  equipment/Devices: None Discharge Condition: Stable CODE STATUS: Full Diet recommendation: Heart Healthy / Carb Modified   Brief/Interim Summary: Craig Haywood Corbettis a 79 y.o.malewith medical history significant forA. fib/flutter not currently on anticoagulation, CAD, HTN, HLD, TIA, and memory loss who presented to the ED for evaluation of abnormal mentation. Presenting history is unclear. Per ED documentation EMS were called to his residence. He was apparently agitated but also complaining of chest and abdominal pain. He was brought to the ED for further evaluation. He was agitated on arrival and disoriented. He was repetitively saying that God has given ESP per ED documentation. He is not felt to have capacity to leave [facility] therefore he was placed under IVC. Daughter was called by admitting provider and she was on aware of his hospitalization.  At baseline he is able to communicate and complete all his ADLs.  He lives alone.  Patient has an history of dementia and was prescribed Aricept but she does not think that he was taking his medications regularly. Did develop one transient episode of bradycardia while in ED, remained stable since then. Imaging which includes CT head and CT abdomen and pelvis was without any acute abnormalities.  Labs within normal limit.  Chest x-ray with mildly large cardiac silhouette without any other acute abnormality. Psych was also consulted-he does not need inpatient behavioral health services and IVC it was discontinued by  psychiatrist. Psych also evaluated him to see if he has a capacity to make decision and he do have capacity to make decision.  Apparently an open APS case which can follow-up with him as an outpatient.  Patient did show some agitation at time, not sure about his alcohol use but he states that he does not drink.  He was placed on CIWA protocol.  Might be sundowning effect with his history of underlying dementia.  Sitter was obtained to help and he was placed delirium precautions.  Patient was found to have hyponatremia, decreased urine osmolality, urinary sodium of 55 and serum osmolality within normal limit.  TSH and a.m. cortisol were within normal limit.  CT chest without any abnormality, did show a small right upper lobe nodule which will required follow-up in 1 year in high risk patient.  His primary care provider should be able to follow-up. Patient was on HCTZ which was discontinued due to persistent hyponatremia on discharge, he can continue with lisinopril only and will follow up with his primary care provider for further management of his hypertension.  Patient with history of atrial flutter, did developed a transient episode of bradycardia.  Which resolved spontaneously.  Cardiology was consulted in ED and they do not have any specific recommendations.  He will continue with his home medications for hypertension and will follow up with his primary care provider.  He does has an history of CAD according to cardiology note and had a stent placement in 1990s, no chest pain and troponin remain negative.  Patient will need a formal evaluation for his underlying dementia which is most likely the cause of this admission. Patient wants to go home and he is  being discharged home with home health services. Had a long discussion with his family regarding providing some help and seeking memory care unit if appropriate as an outpatient.  Family remains very vague, stating that they had a plan in place but  we are not sure what is their plan.  He will continue with his home meds and follow-up with his providers.  Discharge Diagnoses:  Principal Problem:   Dementia ([place]) Active Problems:   CAD (coronary artery disease), native coronary artery   Hyperlipidemia   Essential hypertension   Memory loss   Altered mental status   Atrial flutter (HCC)   Bradycardia   Protein-calorie malnutrition, severe   Discharge Instructions  Discharge Instructions    Diet - low sodium heart healthy   Complete by: As directed    Discharge instructions   Complete by: As directed    It was pleasure taking care of you. Please continue taking your home medications and follow-up with your primary care provider for further management.   Discharge instructions   Complete by: As directed    Your sodium levels were low, you were taking a medication called HCTZ which is a part of the combination pill for your blood pressure.  Please stop taking that pill and you will only take lisinopril and follow-up with your primary care provider for further recommendations.   Increase activity slowly   Complete by: As directed    Increase activity slowly   Complete by: As directed      Allergies as of 04/26/2020      Reactions   Penicillins       Medication List    STOP taking these medications   lisinopril-hydrochlorothiazide 20-25 MG tablet Commonly known as: ZESTORETIC     TAKE these medications   donepezil 10 MG tablet Commonly known as: ARICEPT TK 1 T PO HS FOR MEMORY   feeding supplement Liqd Take 237 mLs by mouth 3 (three) times daily between meals.   lisinopril 20 MG tablet Commonly known as: ZESTRIL Take 1 tablet (20 mg total) by mouth daily.   multivitamin with minerals Tabs tablet Take 1 tablet by mouth daily. Start taking on: April 27, 2020   oxybutynin 5 MG tablet Commonly known as: DITROPAN Take 5 mg by mouth at bedtime.       Follow-up Information    [name], MD. Go on  05/03/2020.   Specialty: Family Medicine Why: 10:40am appointment Contact information: [place] [address] [phone]              Allergies  Allergen Reactions  . Penicillins     Consultations:  Psychiatry  Procedures/Studies: CT Head Wo Contrast  Result Date: 04/20/2020 CLINICAL DATA:  Mental status change. EXAM: CT HEAD WITHOUT CONTRAST TECHNIQUE: Contiguous axial images were obtained from the base of the skull through the vertex without intravenous contrast. COMPARISON:  None. FINDINGS: Brain: No acute hemorrhage. Generalized atrophy. Moderate to advanced periventricular and deep white matter hypodensity, nonspecific but typical of chronic small vessel ischemia. No evidence of acute ischemia or territorial infarct. No hydrocephalus, midline shift, or mass effect. Minimal basal gangliar mineralization is likely senescent. No subdural or extra-axial collection. Vascular: Atherosclerosis of skullbase vasculature without hyperdense vessel or abnormal calcification. Skull: No fracture or focal lesion. Sinuses/Orbits: Paranasal sinuses and mastoid air cells are clear. The visualized orbits are unremarkable. Other: None. IMPRESSION: 1. No acute intracranial abnormality. 2. Generalized atrophy and moderate to advanced chronic small vessel ischemia.  Electronically Signed   By: Keith Rake M.D.   On: 04/20/2020 16:30   CT CHEST W CONTRAST  Result Date: 04/24/2020 CLINICAL DATA:  Persistent cough. EXAM: CT CHEST WITH CONTRAST TECHNIQUE: Multidetector CT imaging of the chest was performed during intravenous contrast administration. CONTRAST:  65mL OMNIPAQUE IOHEXOL 300 MG/ML  SOLN COMPARISON:  Radiograph 04/20/2020 FINDINGS: Cardiovascular: Atherosclerosis of the thoracic aorta. No aortic dissection. No aortic aneurysm. No filling defects in the central pulmonary arteries to the proximal segmental level to suggest pulmonary embolus. Mild cardiomegaly. There are  coronary artery calcifications. There is a small pericardial effusion with fluid tracking into the superior pericardial recess. Mediastinum/Nodes: No enlarged mediastinal or hilar lymph nodes. Patulous upper esophagus without wall thickening. There is no thyroid nodule. Lungs/Pleura: Mild apical predominant emphysema with mild central bronchial thickening. Minimal focal bronchiectasis with adjacent linear opacity in the medial right middle lobe. Linear atelectasis in the lingula typical atelectasis/scar. No confluent consolidation or evidence of pneumonia. There is no pulmonary mass. Tiny right upper lobe nodule measures 4 mm, series 3, image 30. no findings of pulmonary edema. There is no pleural fluid. Upper Abdomen: No acute or unexpected findings. Moderate stool in the included left colon. Musculoskeletal: There are no acute or suspicious osseous abnormalities. Remote left posterior rib fractures. There are innumerable fat density lobules throughout the skin and subcutaneous tissues of the torso, of unknown but doubtful clinical significance. Mild generalized subcutaneous edema. IMPRESSION: 1. No evidence of pneumonia. 2. Mild cardiomegaly with coronary artery calcifications. Small pericardial effusion. 3. Tiny right upper lobe nodule measures 4 mm. In a low risk patient, no further follow-up is needed. In a high-risk patient, optional CT could be considered in 12 months. 4. Mild emphysema and bronchial thickening. Aortic Atherosclerosis (ICD10-I70.0) and Emphysema (ICD10-J43.9). Electronically Signed   By: Keith Rake M.D.   On: 04/24/2020 19:13   CT ABDOMEN PELVIS W CONTRAST  Result Date: 04/20/2020 CLINICAL DATA:  Abdominal pain, nausea and vomiting, bradycardia EXAM: CT ABDOMEN AND PELVIS WITH CONTRAST TECHNIQUE: Multidetector CT imaging of the abdomen and pelvis was performed using the standard protocol following bolus administration of intravenous contrast. CONTRAST:  19mL OMNIPAQUE IOHEXOL 300  MG/ML  SOLN COMPARISON:  10/31/2017 FINDINGS: Lower chest: Heart is enlarged without pericardial effusion. No acute pleural or parenchymal lung disease. Hepatobiliary: Stable hypodensity right lobe liver measuring 2.3 cm, demonstrating delayed centripetal enhancement consistent with hemangioma. No other focal liver abnormalities. Gallbladder is unremarkable. No biliary dilation. Pancreas: Unremarkable. No pancreatic ductal dilatation or surrounding inflammatory changes. Spleen: Normal in size without focal abnormality. Adrenals/Urinary Tract: Mild bilateral renal cortical thinning. Otherwise the kidneys enhance normally and symmetrically. Adrenals are grossly normal. Bladder is decompressed, limiting evaluation. Stomach/Bowel: No bowel obstruction or ileus. No bowel wall thickening or inflammatory change. Vascular/Lymphatic: Aortic atherosclerosis. No enlarged abdominal or pelvic lymph nodes. Reproductive: Radiotherapy seeds are seen within the prostate. Other: No free fluid or free gas. No abdominal wall hernia. Musculoskeletal: No acute or destructive bony lesions. Prominent spondylosis and facet hypertrophy at the lumbosacral junction. Reconstructed images demonstrate no additional findings. IMPRESSION: 1. No acute intra-abdominal or intrapelvic process. 2. Stable right lobe liver hemangioma. 3. Aortic Atherosclerosis (ICD10-I70.0). Electronically Signed   By: Randa Ngo M.D.   On: 04/20/2020 16:32   DG Chest Portable 1 View  Result Date: 04/20/2020 CLINICAL DATA:  Right lower abdominal pain, altered level of consciousness, previous tobacco abuse EXAM: PORTABLE CHEST 1 VIEW COMPARISON:  09/08/2017 FINDINGS: 2 frontal views of the  chest demonstrate mild enlargement the cardiac silhouette, likely exacerbated by portable AP technique. No airspace disease, effusion, or pneumothorax. No acute bony abnormalities. IMPRESSION: 1. Mild enlargement of the cardiac silhouette. 2. No acute intrathoracic process.  Electronically Signed   By: Randa Ngo M.D.   On: 04/20/2020 15:36    Subjective: Patient was seen and examined before discharge.  Denies any complaint.  He wants to go home.  Discharge Exam: Vitals:   04/26/20 0359 04/26/20 0835  BP: (!) 115/57 111/77  Pulse: 61 62  Resp: 16 16  Temp: 98.1 F (36.7 C) 98.2 F (36.8 C)  SpO2: 100% 100%   Vitals:   04/25/20 2012 04/25/20 2357 04/26/20 0359 04/26/20 0835  BP: 127/74 115/70 (!) 115/57 111/77  Pulse: 61 65 61 62  Resp: 18 20 16 16   Temp: 98 F (36.7 C) 97.6 F (36.4 C) 98.1 F (36.7 C) 98.2 F (36.8 C)  TempSrc: Oral Oral  Oral  SpO2: 100% 100% 100% 100%  Weight:      Height:        General: Pt is alert, awake, not in acute distress Cardiovascular: RRR, S1/S2 +, no rubs, no gallops Respiratory: CTA bilaterally, no wheezing, no rhonchi Abdominal: Soft, NT, ND, bowel sounds + Extremities: no edema, no cyanosis   The results of significant diagnostics from this hospitalization (including imaging, microbiology, ancillary and laboratory) are listed below for reference.    Microbiology: Recent Results (from the past 240 hour(s))  Respiratory Panel by RT PCR (Flu A&B, Covid) - Nasopharyngeal Swab     Status: None   Collection Time: 04/20/20  4:41 PM   Specimen: Nasopharyngeal Swab  Result Value Ref Range Status   SARS Coronavirus 2 by RT PCR NEGATIVE NEGATIVE Final    Comment: (NOTE) SARS-CoV-2 target nucleic acids are NOT DETECTED.  The SARS-CoV-2 RNA is generally detectable in upper respiratoy specimens during the acute phase of infection. The lowest concentration of SARS-CoV-2 viral copies this assay can detect is 131 copies/mL. A negative result does not preclude SARS-Cov-2 infection and should not be used as the sole basis for treatment or other patient management decisions. A negative result may occur with  improper specimen collection/handling, submission of specimen other than nasopharyngeal swab, presence  of viral mutation(s) within the areas targeted by this assay, and inadequate number of viral copies (<131 copies/mL). A negative result must be combined with clinical observations, patient history, and epidemiological information. The expected result is Negative.  Fact Sheet for Patients:  PinkCheek.be  Fact Sheet for Healthcare Providers:  GravelBags.it  This test is no t yet approved or cleared by the [place] FDA and  has been authorized for detection and/or diagnosis of SARS-CoV-2 by FDA under an Emergency Use Authorization (EUA). This EUA will remain  in effect (meaning this test can be used) for the duration of the COVID-19 declaration under Section 564(b)(1) of the Act, 21 U.S.C. section 360bbb-3(b)(1), unless the authorization is terminated or revoked sooner.     Influenza A by PCR NEGATIVE NEGATIVE Final   Influenza B by PCR NEGATIVE NEGATIVE Final    Comment: (NOTE) The Xpert Xpress SARS-CoV-2/FLU/RSV assay is intended as an aid in  the diagnosis of influenza from Nasopharyngeal swab specimens and  should not be used as a sole basis for treatment. Nasal washings and  aspirates are unacceptable for Xpert Xpress SARS-CoV-2/FLU/RSV  testing.  Fact Sheet for Patients: PinkCheek.be  Fact Sheet for Healthcare Providers: GravelBags.it  This test is not  yet approved or cleared by the [place] and  has been authorized for detection and/or diagnosis of SARS-CoV-2 by  FDA under an Emergency Use Authorization (EUA). This EUA will remain  in effect (meaning this test can be used) for the duration of the  Covid-19 declaration under Section 564(b)(1) of the Act, 21  U.S.C. section 360bbb-3(b)(1), unless the authorization is  terminated or revoked. Performed at [facility], [address] [place], [place] [address]      Labs: BNP (last  3 results) No results for input(s): BNP in the last 8760 hours. Basic Metabolic Panel: Recent Labs  Lab 04/20/20 1526 04/20/20 2006 04/21/20 0434 04/22/20 0925 04/23/20 0644 04/24/20 0416 04/24/20 1923 04/26/20 0458  NA   < >  --  131* 129* 128* 129*  --  129*  K   < >  --  3.6 4.6 4.0 3.9  --  4.2  CL   < >  --  99 99 96* 92*  --  95*  CO2   < >  --  25 21* 25 28  --  28  GLUCOSE   < >  --  78 75 88 109*  --  82  BUN   < >  --  11 11 17 22   --  27*  CREATININE   < >  --  0.88 0.90 1.06 1.04  --  1.02  CALCIUM   < >  --  8.7* 8.6* 8.8* 9.6  --  8.9  MG  --  2.1  --   --   --   --  2.1  --   PHOS  --   --   --   --   --   --  2.3*  --    < > = values in this interval not displayed.   Liver Function Tests: Recent Labs  Lab 04/20/20 1526  AST 17  ALT 15  ALKPHOS 57  BILITOT 1.0  PROT 9.0*  ALBUMIN 3.3*   Recent Labs  Lab 04/20/20 1526  LIPASE 25   No results for input(s): AMMONIA in the last 168 hours. CBC: Recent Labs  Lab 04/20/20 1234 04/20/20 1549 04/21/20 0434 04/22/20 1003 04/24/20 1923 04/26/20 0458  WBC 3.4*  --  2.9* 3.0* 3.8* 3.1*  HGB 13.3  --  12.4* 12.9* 11.9* 12.1*  HCT 40.2 37.1* 36.7* 38.1* 36.1* 36.4*  MCV 95.9  --  93.4 93.8 95.0 95.0  PLT 143*  --  142* 155 171 167   Cardiac Enzymes: No results for input(s): CKTOTAL, CKMB, CKMBINDEX, TROPONINI in the last 168 hours. BNP: Invalid input(s): POCBNP CBG: No results for input(s): GLUCAP in the last 168 hours. D-Dimer No results for input(s): DDIMER in the last 72 hours. Hgb A1c No results for input(s): HGBA1C in the last 72 hours. Lipid Profile No results for input(s): CHOL, HDL, LDLCALC, TRIG, CHOLHDL, LDLDIRECT in the last 72 hours. Thyroid function studies Recent Labs    04/24/20 0416  TSH 3.495   Anemia work up No results for input(s): VITAMINB12, FOLATE, FERRITIN, TIBC, IRON, RETICCTPCT in the last 72 hours. Urinalysis    Component Value Date/Time   COLORURINE YELLOW (A)  04/20/2020 1222   APPEARANCEUR CLEAR (A) 04/20/2020 1222   LABSPEC 1.010 04/20/2020 1222   PHURINE 6.0 04/20/2020 [place] 04/20/2020 1222   HGBUR NEGATIVE 04/20/2020 1222   BILIRUBINUR NEGATIVE 04/20/2020 1222   KETONESUR NEGATIVE 04/20/2020 1222  PROTEINUR NEGATIVE 04/20/2020 1222   NITRITE NEGATIVE 04/20/2020 1222   LEUKOCYTESUR NEGATIVE 04/20/2020 1222   Sepsis Labs Invalid input(s): PROCALCITONIN,  WBC,  LACTICIDVEN Microbiology Recent Results (from the past 240 hour(s))  Respiratory Panel by RT PCR (Flu A&B, Covid) - Nasopharyngeal Swab     Status: None   Collection Time: 04/20/20  4:41 PM   Specimen: Nasopharyngeal Swab  Result Value Ref Range Status   SARS Coronavirus 2 by RT PCR NEGATIVE NEGATIVE Final    Comment: (NOTE) SARS-CoV-2 target nucleic acids are NOT DETECTED.  The SARS-CoV-2 RNA is generally detectable in upper respiratoy specimens during the acute phase of infection. The lowest concentration of SARS-CoV-2 viral copies this assay can detect is 131 copies/mL. A negative result does not preclude SARS-Cov-2 infection and should not be used as the sole basis for treatment or other patient management decisions. A negative result may occur with  improper specimen collection/handling, submission of specimen other than nasopharyngeal swab, presence of viral mutation(s) within the areas targeted by this assay, and inadequate number of viral copies (<131 copies/mL). A negative result must be combined with clinical observations, patient history, and epidemiological information. The expected result is Negative.  Fact Sheet for Patients:  PinkCheek.be  Fact Sheet for Healthcare Providers:  GravelBags.it  This test is no t yet approved or cleared by the [place] FDA and  has been authorized for detection and/or diagnosis of SARS-CoV-2 by FDA under an Emergency Use Authorization (EUA).  This EUA will remain  in effect (meaning this test can be used) for the duration of the COVID-19 declaration under Section 564(b)(1) of the Act, 21 U.S.C. section 360bbb-3(b)(1), unless the authorization is terminated or revoked sooner.     Influenza A by PCR NEGATIVE NEGATIVE Final   Influenza B by PCR NEGATIVE NEGATIVE Final    Comment: (NOTE) The Xpert Xpress SARS-CoV-2/FLU/RSV assay is intended as an aid in  the diagnosis of influenza from Nasopharyngeal swab specimens and  should not be used as a sole basis for treatment. Nasal washings and  aspirates are unacceptable for Xpert Xpress SARS-CoV-2/FLU/RSV  testing.  Fact Sheet for Patients: PinkCheek.be  Fact Sheet for Healthcare Providers: GravelBags.it  This test is not yet approved or cleared by the [place] FDA and  has been authorized for detection and/or diagnosis of SARS-CoV-2 by  FDA under an Emergency Use Authorization (EUA). This EUA will remain  in effect (meaning this test can be used) for the duration of the  Covid-19 declaration under Section 564(b)(1) of the Act, 21  U.S.C. section 360bbb-3(b)(1), unless the authorization is  terminated or revoked. Performed at [facility], [address] [place], [place] [address]     Time coordinating discharge: Over 30 minutes  SIGNED:  Lorella Nimrod, MD  [facility] 04/26/2020, 11:27 AM  If 7PM-7AM, please contact night-coverage [email or web address]  This record has been created using Systems analyst. Errors have been sought and corrected,but may not always be located. Such creation errors do not reflect on the standard of care.

## 2020-04-26 NOTE — Care Management Important Message (Signed)
Important Message  Patient Details  Name: Winfield Caba MRN: [id number] Date of Birth: Aug 12, 1940   Medicare Important Message Given:  N/A - LOS <3 / Initial given by admissions     [name] A Kimesha Claxton 04/26/2020, 8:14 AM

## 2020-05-18 ENCOUNTER — Emergency Department
Admission: EM | Admit: 2020-05-18 | Discharge: 2020-05-18 | Disposition: A | Discharge status: Home / Self Care | Payer: Medicare (Managed Care) | Attending: Emergency Medicine | Admitting: Emergency Medicine

## 2020-05-18 ENCOUNTER — Other Ambulatory Visit: Payer: Self-pay

## 2020-05-18 DIAGNOSIS — I2510 Atherosclerotic heart disease of native coronary artery without angina pectoris: Secondary | ICD-10-CM | POA: Insufficient documentation

## 2020-05-18 DIAGNOSIS — I4891 Unspecified atrial fibrillation: Secondary | ICD-10-CM | POA: Diagnosis not present

## 2020-05-18 DIAGNOSIS — Z87891 Personal history of nicotine dependence: Secondary | ICD-10-CM | POA: Insufficient documentation

## 2020-05-18 DIAGNOSIS — I10 Essential (primary) hypertension: Secondary | ICD-10-CM | POA: Diagnosis not present

## 2020-05-18 DIAGNOSIS — F0390 Unspecified dementia without behavioral disturbance: Secondary | ICD-10-CM | POA: Diagnosis not present

## 2020-05-18 DIAGNOSIS — T50901A Poisoning by unspecified drugs, medicaments and biological substances, accidental (unintentional), initial encounter: Secondary | ICD-10-CM | POA: Insufficient documentation

## 2020-05-18 DIAGNOSIS — I4892 Unspecified atrial flutter: Secondary | ICD-10-CM

## 2020-05-18 DIAGNOSIS — Z79899 Other long term (current) drug therapy: Secondary | ICD-10-CM | POA: Diagnosis not present

## 2020-05-18 DIAGNOSIS — T6594XA Toxic effect of unspecified substance, undetermined, initial encounter: Secondary | ICD-10-CM

## 2020-05-18 LAB — CBC
HCT: 41.8 % (ref 39.0–52.0)
Hemoglobin: 13.7 g/dL (ref 13.0–17.0)
MCH: 31.4 pg (ref 26.0–34.0)
MCHC: 32.8 g/dL (ref 30.0–36.0)
MCV: 95.7 fL (ref 80.0–100.0)
Platelets: 235 10*3/uL (ref 150–400)
RBC: 4.37 MIL/uL (ref 4.22–5.81)
RDW: 12 % (ref 11.5–15.5)
WBC: 6.5 10*3/uL (ref 4.0–10.5)
nRBC: 0 % (ref 0.0–0.2)

## 2020-05-18 LAB — COMPREHENSIVE METABOLIC PANEL
ALT: 22 U/L (ref 0–44)
AST: 28 U/L (ref 15–41)
Albumin: 2.8 g/dL — ABNORMAL LOW (ref 3.5–5.0)
Alkaline Phosphatase: 61 U/L (ref 38–126)
Anion gap: 11 (ref 5–15)
BUN: 24 mg/dL — ABNORMAL HIGH (ref 8–23)
CO2: 24 mmol/L (ref 22–32)
Calcium: 9 mg/dL (ref 8.9–10.3)
Chloride: 103 mmol/L (ref 98–111)
Creatinine, Ser: 1.05 mg/dL (ref 0.61–1.24)
GFR, Estimated: >60 mL/min (ref >60)
Glucose, Bld: 126 mg/dL — ABNORMAL HIGH (ref 70–99)
Potassium: 4 mmol/L (ref 3.5–5.1)
Sodium: 138 mmol/L (ref 135–145)
Total Bilirubin: 1.1 mg/dL (ref 0.3–1.2)
Total Protein: 8.8 g/dL — ABNORMAL HIGH (ref 6.5–8.1)

## 2020-05-18 LAB — LIPASE, BLOOD: Lipase: 22 U/L (ref 11–51)

## 2020-05-18 MED ORDER — METOPROLOL TARTRATE 5 MG/5ML IV SOLN
5.0000 mg | INTRAVENOUS | Status: DC | PRN
  Administered 2020-05-18: 5 mg via INTRAVENOUS
  Filled 2020-05-18: qty 5

## 2020-05-18 MED ORDER — LACTATED RINGERS IV BOLUS
1000.0000 mL | Freq: Once | INTRAVENOUS | Status: AC
  Administered 2020-05-18: 1000 mL via INTRAVENOUS

## 2020-05-18 NOTE — ED Provider Notes (Signed)
[facility] Emergency Department Provider Note   ____________________________________________   First MD Initiated Contact with Patient 05/18/20 1930     (approximate)  I have reviewed the triage vital signs and the nursing notes.   HISTORY  Chief Complaint Abdominal Pain    HPI Craig [name] is a 79 y.o. male with past medical history of hypertension, hyperlipidemia, dementia, CAD, and atrial flutter not on anticoagulation who presents to the ED for ingestion.  Daughter states that she found patient sitting in bed with a bottle of Drano sitting on the nightstand.  She was concerned that patient could have ingested some of the Drano from the bottle.  Patient adamantly denies this, states "you would have to be a fool to drink Drano."  He reportedly complained of some abdominal pain 2 nights ago, but denies any symptoms since being found with the Drano next to him.  He has not had any nausea, vomiting, or drooling.  He is at his baseline mental status per daughter.        Past Medical History:  Diagnosis Date  . Atrial flutter ([place])   . Coronary artery disease   . Hyperlipidemia   . Hypertension   . TIA (transient ischemic attack)     Patient Active Problem List   Diagnosis Date Noted  . Protein-calorie malnutrition, severe 04/22/2020  . Dementia ([place]) 04/21/2020  . Altered mental status 04/20/2020  . Atrial flutter ([place])   . Bradycardia   . CAD (coronary artery disease), native coronary artery 12/25/2017  . History of coronary artery stent placement 12/25/2017  . Hyperlipidemia 12/25/2017  . Former smoker 12/25/2017  . Essential hypertension 12/25/2017  . Memory loss 12/25/2017    Past Surgical History:  Procedure Laterality Date  . arm surgery Left   . HERNIA REPAIR      Prior to Admission medications   Medication Sig Start Date End Date Taking? Authorizing Provider  donepezil (ARICEPT) 10 MG tablet TK 1 T PO HS FOR MEMORY  12/18/17   [provider]  feeding supplement (ENSURE ENLIVE / ENSURE PLUS) LIQD Take 237 mLs by mouth 3 (three) times daily between meals. 04/26/20   Lorella Nimrod, MD  lisinopril (ZESTRIL) 20 MG tablet Take 1 tablet (20 mg total) by mouth daily. 04/26/20   Lorella Nimrod, MD  Multiple Vitamin (MULTIVITAMIN WITH MINERALS) TABS tablet Take 1 tablet by mouth daily. 04/27/20   Lorella Nimrod, MD  oxybutynin (DITROPAN) 5 MG tablet Take 5 mg by mouth at bedtime. 02/05/20   [provider]    Allergies Penicillins  No family history on file.  Social History Social History   Tobacco Use  . Smoking status: Former Smoker    Packs/day: 0.00  . Smokeless tobacco: Current User    Types: Snuff  Substance Use Topics  . Alcohol use: Yes  . Drug use: No    Review of Systems  Constitutional: No fever/chills.  Positive for possible ingestion. Eyes: No visual changes. ENT: No sore throat. Cardiovascular: Denies chest pain. Respiratory: Denies shortness of breath. Gastrointestinal: No abdominal pain.  No nausea, no vomiting.  No diarrhea.  No constipation. Genitourinary: Negative for dysuria. Musculoskeletal: Negative for back pain. Skin: Negative for rash. Neurological: Negative for headaches, focal weakness or numbness.  ____________________________________________   PHYSICAL EXAM:  VITAL SIGNS: ED Triage Vitals  Enc Vitals Group     BP 05/18/20 1850 (!) 143/88     Pulse Rate 05/18/20 1850 (!) 125  Resp 05/18/20 1850 18     Temp 05/18/20 1850 97.8 F (36.6 C)     Temp src --      SpO2 05/18/20 1850 98 %     Weight 05/18/20 1851 165 lb (74.8 kg)     Height 05/18/20 1851 6\' 2"  (1.88 m)     Head Circumference --      Peak Flow --      Pain Score 05/18/20 1851 5     Pain Loc --      Pain Edu? --      Excl. in [place]? --     Constitutional: Alert and oriented to person and place, but not time. Eyes: Conjunctivae are normal. Head: Atraumatic. Nose: No  congestion/rhinnorhea. Mouth/Throat: Mucous membranes are moist.  No mucosal erythema, patient tolerating secretions without difficulty. Neck: Normal ROM Cardiovascular: Tachycardic, irregularly irregular rhythm. Grossly normal heart sounds. Respiratory: Normal respiratory effort.  No retractions. Lungs CTAB. Gastrointestinal: Soft and nontender. No distention. Genitourinary: deferred Musculoskeletal: No lower extremity tenderness nor edema. Neurologic:  Normal speech and language. No gross focal neurologic deficits are appreciated. Skin:  Skin is warm, dry and intact. No rash noted. Psychiatric: Mood and affect are normal. Speech and behavior are normal.  ____________________________________________   LABS (all labs ordered are listed, but only abnormal results are displayed)  Labs Reviewed  COMPREHENSIVE METABOLIC PANEL - Abnormal; Notable for the following components:      Result Value   Glucose, Bld 126 (*)    BUN 24 (*)    Total Protein 8.8 (*)    Albumin 2.8 (*)    All other components within normal limits  LIPASE, BLOOD  CBC  URINALYSIS, COMPLETE (UACMP) WITH MICROSCOPIC   ____________________________________________  EKG  ED ECG REPORT I, Blake Divine, the attending physician, personally viewed and interpreted this ECG.   Date: 05/18/2020  EKG Time: 18:51  Rate: 134  Rhythm: Atrial flutter  Axis: LAD  Intervals:none  ST&T Change: None   PROCEDURES  Procedure(s) performed (including Critical Care):  .1-3 Lead EKG Interpretation Performed by: Blake Divine, MD Authorized by: Blake Divine, MD     Interpretation: abnormal     ECG rate:  121   ECG rate assessment: tachycardic     Rhythm: atrial flutter     Ectopy: none     Conduction: normal       ____________________________________________   INITIAL IMPRESSION / ASSESSMENT AND PLAN / ED COURSE       79 year old male with past medical history of hypertension, hyperlipidemia, dementia,  CAD, and atrial flutter not on anticoagulation who presents to the ED for questionable ingestion of Drano.  Patient adamantly denies this and shows no overt signs of having ingested a caustic substance.  He reportedly had some abdominal pain 2 days ago but denies any today and has no focal tenderness on exam.  Incidentally, patient found to be in atrial flutter with 2:1 conduction but no ischemic changes.  He denies any chest pain or shortness of breath.  Labs are unremarkable, patient was given a dose of IV metoprolol with improvement in heart rate.  Case discussed with poison control, who recommends no further observation given reassuring appearance of patient.  He is appropriate for discharge home with cardiology follow-up, daughter agrees with plan.      ____________________________________________   FINAL CLINICAL IMPRESSION(S) / ED DIAGNOSES  Final diagnoses:  Atrial flutter, unspecified type ([place])  Ingestion of unknown nonmedicinal substance, undetermined intent, initial encounter  ED Discharge Orders    None       Note:  This document was prepared using Dragon voice recognition software and may include unintentional dictation errors.   Blake Divine, MD 05/18/20 2055

## 2020-05-18 NOTE — ED Notes (Signed)
Poison control notified about possible ingestion of Drano, reports if the patient is vomiting or drooling on arrival there would be concern, pt can be offered water and if he can keep it down there is no concern for drano ingestion at this time.  Per PC the patient's case will be closed out due to the patient not drooling and not having any nausea or vomiting.  Will wait for MD approval on allowing him to drink water due to patient's already complaint of abdominal pain which the daughter states started yesterday.   Per PC if there are other concerns to call back.

## 2020-05-18 NOTE — ED Notes (Addendum)
RN Caryl Pina to call poison control and provide needed information; First RN Lattie Haw aware.

## 2020-05-18 NOTE — ED Triage Notes (Signed)
Pt comes via POV from home with c/o abdominal pain. Pt states he doesn't really know why he is here. Pt states his belly was cramping a little bit.   Per family they found a bottle of Draino next to pt's bed. Pt and family unsure where it came from. Daughter worried that pt might have drank some.  Pt denies drinking any draino. Pt states " You would be a fool to drink that draino". "he stated do I look like a fool to you.  Pt denies any vomiting or diarrhea.  Pt is A&O to person and place. Pt little confused to day of week. Pt has hx of dementia according to records.

## 2020-05-26 ENCOUNTER — Inpatient Hospital Stay
Admission: EM | Admit: 2020-05-26 | Discharge: 2020-06-15 | DRG: 329 | Disposition: A | Discharge status: Skilled Nursing Facility | Payer: Medicare (Managed Care) | Attending: Internal Medicine | Admitting: Internal Medicine

## 2020-05-26 ENCOUNTER — Other Ambulatory Visit: Payer: Self-pay

## 2020-05-26 ENCOUNTER — Emergency Department: Payer: Medicare (Managed Care)

## 2020-05-26 ENCOUNTER — Encounter: Payer: Self-pay | Admitting: Emergency Medicine

## 2020-05-26 DIAGNOSIS — E8809 Other disorders of plasma-protein metabolism, not elsewhere classified: Secondary | ICD-10-CM | POA: Diagnosis present

## 2020-05-26 DIAGNOSIS — Z978 Presence of other specified devices: Secondary | ICD-10-CM

## 2020-05-26 DIAGNOSIS — I252 Old myocardial infarction: Secondary | ICD-10-CM

## 2020-05-26 DIAGNOSIS — E43 Unspecified severe protein-calorie malnutrition: Secondary | ICD-10-CM | POA: Diagnosis present

## 2020-05-26 DIAGNOSIS — E870 Hyperosmolality and hypernatremia: Secondary | ICD-10-CM

## 2020-05-26 DIAGNOSIS — F17290 Nicotine dependence, other tobacco product, uncomplicated: Secondary | ICD-10-CM | POA: Diagnosis present

## 2020-05-26 DIAGNOSIS — R1084 Generalized abdominal pain: Secondary | ICD-10-CM

## 2020-05-26 DIAGNOSIS — F0391 Unspecified dementia with behavioral disturbance: Secondary | ICD-10-CM | POA: Diagnosis present

## 2020-05-26 DIAGNOSIS — I700 Atherosclerosis of aorta: Secondary | ICD-10-CM | POA: Diagnosis present

## 2020-05-26 DIAGNOSIS — N491 Inflammatory disorders of spermatic cord, tunica vaginalis and vas deferens: Secondary | ICD-10-CM | POA: Diagnosis present

## 2020-05-26 DIAGNOSIS — I5020 Unspecified systolic (congestive) heart failure: Secondary | ICD-10-CM

## 2020-05-26 DIAGNOSIS — Z95828 Presence of other vascular implants and grafts: Secondary | ICD-10-CM

## 2020-05-26 DIAGNOSIS — I480 Paroxysmal atrial fibrillation: Secondary | ICD-10-CM | POA: Diagnosis present

## 2020-05-26 DIAGNOSIS — Z7982 Long term (current) use of aspirin: Secondary | ICD-10-CM

## 2020-05-26 DIAGNOSIS — Z951 Presence of aortocoronary bypass graft: Secondary | ICD-10-CM

## 2020-05-26 DIAGNOSIS — I429 Cardiomyopathy, unspecified: Secondary | ICD-10-CM | POA: Diagnosis present

## 2020-05-26 DIAGNOSIS — K56609 Unspecified intestinal obstruction, unspecified as to partial versus complete obstruction: Secondary | ICD-10-CM

## 2020-05-26 DIAGNOSIS — E785 Hyperlipidemia, unspecified: Secondary | ICD-10-CM | POA: Diagnosis present

## 2020-05-26 DIAGNOSIS — I5023 Acute on chronic systolic (congestive) heart failure: Secondary | ICD-10-CM | POA: Diagnosis present

## 2020-05-26 DIAGNOSIS — G9341 Metabolic encephalopathy: Secondary | ICD-10-CM | POA: Diagnosis present

## 2020-05-26 DIAGNOSIS — E876 Hypokalemia: Secondary | ICD-10-CM | POA: Diagnosis present

## 2020-05-26 DIAGNOSIS — F03918 Unspecified dementia, unspecified severity, with other behavioral disturbance: Secondary | ICD-10-CM

## 2020-05-26 DIAGNOSIS — I495 Sick sinus syndrome: Secondary | ICD-10-CM | POA: Diagnosis present

## 2020-05-26 DIAGNOSIS — Z880 Allergy status to penicillin: Secondary | ICD-10-CM

## 2020-05-26 DIAGNOSIS — Z955 Presence of coronary angioplasty implant and graft: Secondary | ICD-10-CM

## 2020-05-26 DIAGNOSIS — F17200 Nicotine dependence, unspecified, uncomplicated: Secondary | ICD-10-CM

## 2020-05-26 DIAGNOSIS — K631 Perforation of intestine (nontraumatic): Secondary | ICD-10-CM | POA: Diagnosis present

## 2020-05-26 DIAGNOSIS — K4040 Unilateral inguinal hernia, with gangrene, not specified as recurrent: Secondary | ICD-10-CM | POA: Diagnosis not present

## 2020-05-26 DIAGNOSIS — I471 Supraventricular tachycardia: Secondary | ICD-10-CM | POA: Diagnosis present

## 2020-05-26 DIAGNOSIS — I959 Hypotension, unspecified: Secondary | ICD-10-CM | POA: Diagnosis present

## 2020-05-26 DIAGNOSIS — I2510 Atherosclerotic heart disease of native coronary artery without angina pectoris: Secondary | ICD-10-CM | POA: Diagnosis present

## 2020-05-26 DIAGNOSIS — R451 Restlessness and agitation: Secondary | ICD-10-CM | POA: Diagnosis present

## 2020-05-26 DIAGNOSIS — I4892 Unspecified atrial flutter: Secondary | ICD-10-CM

## 2020-05-26 DIAGNOSIS — Z8673 Personal history of transient ischemic attack (TIA), and cerebral infarction without residual deficits: Secondary | ICD-10-CM

## 2020-05-26 DIAGNOSIS — Z79899 Other long term (current) drug therapy: Secondary | ICD-10-CM

## 2020-05-26 DIAGNOSIS — I25118 Atherosclerotic heart disease of native coronary artery with other forms of angina pectoris: Secondary | ICD-10-CM | POA: Diagnosis present

## 2020-05-26 DIAGNOSIS — I110 Hypertensive heart disease with heart failure: Secondary | ICD-10-CM | POA: Diagnosis present

## 2020-05-26 DIAGNOSIS — Z6820 Body mass index (BMI) 20.0-20.9, adult: Secondary | ICD-10-CM

## 2020-05-26 DIAGNOSIS — E872 Acidosis: Secondary | ICD-10-CM | POA: Diagnosis present

## 2020-05-26 DIAGNOSIS — K567 Ileus, unspecified: Secondary | ICD-10-CM | POA: Diagnosis not present

## 2020-05-26 DIAGNOSIS — Z20822 Contact with and (suspected) exposure to covid-19: Secondary | ICD-10-CM | POA: Diagnosis present

## 2020-05-26 DIAGNOSIS — Z4659 Encounter for fitting and adjustment of other gastrointestinal appliance and device: Secondary | ICD-10-CM

## 2020-05-26 DIAGNOSIS — I4430 Unspecified atrioventricular block: Secondary | ICD-10-CM | POA: Diagnosis present

## 2020-05-26 DIAGNOSIS — R109 Unspecified abdominal pain: Secondary | ICD-10-CM | POA: Diagnosis not present

## 2020-05-26 DIAGNOSIS — I10 Essential (primary) hypertension: Secondary | ICD-10-CM | POA: Diagnosis present

## 2020-05-26 DIAGNOSIS — N400 Enlarged prostate without lower urinary tract symptoms: Secondary | ICD-10-CM | POA: Diagnosis present

## 2020-05-26 LAB — CBC WITH DIFFERENTIAL/PLATELET
Abs Immature Granulocytes: 0.03 10*3/uL (ref 0.00–0.07)
Basophils Absolute: 0 10*3/uL (ref 0.0–0.1)
Basophils Relative: 0 %
Eosinophils Absolute: 0 10*3/uL (ref 0.0–0.5)
Eosinophils Relative: 0 %
HCT: 47.5 % (ref 39.0–52.0)
Hemoglobin: 14.6 g/dL (ref 13.0–17.0)
Immature Granulocytes: 0 %
Lymphocytes Relative: 6 %
Lymphs Abs: 0.5 10*3/uL — ABNORMAL LOW (ref 0.7–4.0)
MCH: 30.9 pg (ref 26.0–34.0)
MCHC: 30.7 g/dL (ref 30.0–36.0)
MCV: 100.6 fL — ABNORMAL HIGH (ref 80.0–100.0)
Monocytes Absolute: 0.7 10*3/uL (ref 0.1–1.0)
Monocytes Relative: 7 %
Neutro Abs: 8.1 10*3/uL — ABNORMAL HIGH (ref 1.7–7.7)
Neutrophils Relative %: 87 %
Platelets: 269 10*3/uL (ref 150–400)
RBC: 4.72 MIL/uL (ref 4.22–5.81)
RDW: 12.3 % (ref 11.5–15.5)
WBC: 9.4 10*3/uL (ref 4.0–10.5)
nRBC: 0 % (ref 0.0–0.2)

## 2020-05-26 LAB — COMPREHENSIVE METABOLIC PANEL
ALT: 24 U/L (ref 0–44)
AST: 33 U/L (ref 15–41)
Albumin: 3 g/dL — ABNORMAL LOW (ref 3.5–5.0)
Alkaline Phosphatase: 53 U/L (ref 38–126)
Anion gap: 13 (ref 5–15)
BUN: 42 mg/dL — ABNORMAL HIGH (ref 8–23)
CO2: 31 mmol/L (ref 22–32)
Calcium: 10 mg/dL (ref 8.9–10.3)
Chloride: 105 mmol/L (ref 98–111)
Creatinine, Ser: 0.89 mg/dL (ref 0.61–1.24)
GFR, Estimated: >60 mL/min (ref >60)
Glucose, Bld: 123 mg/dL — ABNORMAL HIGH (ref 70–99)
Potassium: 3.2 mmol/L — ABNORMAL LOW (ref 3.5–5.1)
Sodium: 149 mmol/L — ABNORMAL HIGH (ref 135–145)
Total Bilirubin: 1.3 mg/dL — ABNORMAL HIGH (ref 0.3–1.2)
Total Protein: 9.2 g/dL — ABNORMAL HIGH (ref 6.5–8.1)

## 2020-05-26 LAB — TROPONIN I (HIGH SENSITIVITY)
Troponin I (High Sensitivity): 54 ng/L — ABNORMAL HIGH (ref <18)
Troponin I (High Sensitivity): 54 ng/L — ABNORMAL HIGH (ref <18)

## 2020-05-26 LAB — BLOOD GAS, VENOUS
Acid-Base Excess: 4 mmol/L — ABNORMAL HIGH (ref 0.0–2.0)
Bicarbonate: 31.4 mmol/L — ABNORMAL HIGH (ref 20.0–28.0)
O2 Saturation: 65.9 %
Patient temperature: 37
pCO2, Ven: 53 mmHg (ref 44.0–60.0)
pH, Ven: 7.38 (ref 7.250–7.430)
pO2, Ven: 35 mmHg (ref 32.0–45.0)

## 2020-05-26 LAB — APTT: aPTT: 24 seconds (ref 24–36)

## 2020-05-26 LAB — LIPASE, BLOOD: Lipase: 42 U/L (ref 11–51)

## 2020-05-26 LAB — PROTIME-INR
INR: 1.3 — ABNORMAL HIGH (ref 0.8–1.2)
Prothrombin Time: 15.9 seconds — ABNORMAL HIGH (ref 11.4–15.2)

## 2020-05-26 LAB — LACTIC ACID, PLASMA
Lactic Acid, Venous: 3 mmol/L (ref 0.5–1.9)
Lactic Acid, Venous: 1.8 mmol/L (ref 0.5–1.9)

## 2020-05-26 LAB — BRAIN NATRIURETIC PEPTIDE: B Natriuretic Peptide: 256.9 pg/mL — ABNORMAL HIGH (ref 0.0–100.0)

## 2020-05-26 MED ORDER — HYDROMORPHONE HCL 1 MG/ML IJ SOLN
INTRAMUSCULAR | Status: AC
  Administered 2020-05-26: 1 mg via INTRAVENOUS
  Filled 2020-05-26: qty 1

## 2020-05-26 MED ORDER — MORPHINE SULFATE (PF) 4 MG/ML IV SOLN: 4.0000 mg | Freq: Once | INTRAVENOUS | Status: AC

## 2020-05-26 MED ORDER — DILTIAZEM HCL 25 MG/5ML IV SOLN
10.0000 mg | Freq: Once | INTRAVENOUS | Status: AC
  Administered 2020-05-26: 10 mg via INTRAVENOUS
  Filled 2020-05-26: qty 5

## 2020-05-26 MED ORDER — MORPHINE SULFATE (PF) 2 MG/ML IV SOLN
2.0000 mg | Freq: Once | INTRAVENOUS | Status: AC
  Administered 2020-05-26: 2 mg via INTRAVENOUS
  Filled 2020-05-26: qty 1

## 2020-05-26 MED ORDER — DILTIAZEM HCL-DEXTROSE 125-5 MG/125ML-% IV SOLN (PREMIX)
5.0000 mg/h | INTRAVENOUS | Status: DC
  Administered 2020-05-27: 5 mg/h via INTRAVENOUS
  Filled 2020-05-26: qty 125

## 2020-05-26 MED ORDER — MORPHINE SULFATE (PF) 4 MG/ML IV SOLN
INTRAVENOUS | Status: AC
  Administered 2020-05-26: 4 mg via INTRAVENOUS
  Filled 2020-05-26: qty 1

## 2020-05-26 MED ORDER — ONDANSETRON HCL 4 MG/2ML IJ SOLN
4.0000 mg | Freq: Once | INTRAMUSCULAR | Status: AC
  Administered 2020-05-26: 4 mg via INTRAVENOUS
  Filled 2020-05-26: qty 2

## 2020-05-26 MED ORDER — HYDROMORPHONE HCL 1 MG/ML IJ SOLN: 1.0000 mg | Freq: Once | INTRAMUSCULAR | Status: AC

## 2020-05-26 MED ORDER — LORAZEPAM 2 MG/ML IJ SOLN: 2.0000 mg | Freq: Once | INTRAMUSCULAR | Status: DC

## 2020-05-26 MED ORDER — IOHEXOL 300 MG/ML  SOLN
100.0000 mL | Freq: Once | INTRAMUSCULAR | Status: AC | PRN
  Administered 2020-05-26: 100 mL via INTRAVENOUS

## 2020-05-26 MED ORDER — LORAZEPAM 2 MG/ML IJ SOLN
INTRAMUSCULAR | Status: AC
  Filled 2020-05-26: qty 1

## 2020-05-26 MED ORDER — METRONIDAZOLE IN NACL 5-0.79 MG/ML-% IV SOLN
500.0000 mg | Freq: Once | INTRAVENOUS | Status: AC
  Administered 2020-05-26: 500 mg via INTRAVENOUS
  Filled 2020-05-26: qty 100

## 2020-05-26 MED ORDER — LEVOFLOXACIN IN D5W 500 MG/100ML IV SOLN
500.0000 mg | Freq: Once | INTRAVENOUS | Status: AC
  Administered 2020-05-27: 500 mg via INTRAVENOUS
  Filled 2020-05-26: qty 100

## 2020-05-26 MED ORDER — SODIUM CHLORIDE 0.9 % IV SOLN: Freq: Once | INTRAVENOUS | Status: AC

## 2020-05-26 MED ORDER — NALOXONE HCL 2 MG/2ML IJ SOSY
PREFILLED_SYRINGE | INTRAMUSCULAR | Status: AC
  Filled 2020-05-26: qty 2

## 2020-05-26 MED ORDER — SODIUM CHLORIDE 0.9 % IV BOLUS
1000.0000 mL | Freq: Once | INTRAVENOUS | Status: AC
  Administered 2020-05-26: 1000 mL via INTRAVENOUS

## 2020-05-26 NOTE — ED Provider Notes (Addendum)
[facility] Emergency Department Provider Note   ____________________________________________   First MD Initiated Contact with Patient 05/26/20 1937     (approximate)  I have reviewed the triage vital signs and the nursing notes.   HISTORY  Chief Complaint Abdominal Pain    HPI Craig [name] is a 79 y.o. male with a history of dementia hyperlipidemia and hypertension other problems who about a month ago began living with his daughter.  For the last week or so his daughter told EMS that he has not been acting like himself.  Last couple days he is not been eating or drinking very much and he told the EMS he is having severe belly pain and thinks he is going to die.  He reported the same thing to me.  His belly is somewhat distended.  He cannot tell me how the belly hurts if it is crampy or steady or achy or exactly when it started.  He is not vomiting currently.         Past Medical History:  Diagnosis Date  . Atrial flutter ([place])   . Coronary artery disease   . Hyperlipidemia   . Hypertension   . TIA (transient ischemic attack)     Patient Active Problem List   Diagnosis Date Noted  . Atrial flutter with rapid ventricular response ([place]) 05/26/2020  . SBO (small bowel obstruction) ([place]) 05/26/2020  . Hypernatremia 05/26/2020  . Nicotine dependence 05/26/2020  . Protein-calorie malnutrition, severe 04/22/2020  . Dementia with behavioral disturbance ([place]) 04/21/2020  . Altered mental status 04/20/2020  . Atrial flutter ([place])   . Bradycardia   . CAD (coronary artery disease), native coronary artery 12/25/2017  . History of coronary artery stent placement 12/25/2017  . Hyperlipidemia 12/25/2017  . Former smoker 12/25/2017  . Essential hypertension 12/25/2017  . Memory loss 12/25/2017    Past Surgical History:  Procedure Laterality Date  . arm surgery Left   . HERNIA REPAIR      Prior to Admission medications   Medication Sig  Start Date End Date Taking? Authorizing Provider  aspirin EC 81 MG tablet Take 81 mg by mouth daily. Swallow whole.   Yes [provider]  lisinopril (ZESTRIL) 20 MG tablet Take 1 tablet (20 mg total) by mouth daily. 04/26/20  Yes Lorella Nimrod, MD  Multiple Vitamin (MULTIVITAMIN WITH MINERALS) TABS tablet Take 1 tablet by mouth daily. 04/27/20  Yes Lorella Nimrod, MD  donepezil (ARICEPT) 10 MG tablet TK 1 T PO HS FOR MEMORY Patient not taking: No sig reported 12/18/17   [provider]  feeding supplement (ENSURE ENLIVE / ENSURE PLUS) LIQD Take 237 mLs by mouth 3 (three) times daily between meals. 04/26/20   Lorella Nimrod, MD  oxybutynin (DITROPAN) 5 MG tablet Take 5 mg by mouth at bedtime. Patient not taking: Reported on 05/26/2020 02/05/20   [provider]    Allergies Penicillins  History reviewed. No pertinent family history.  Social History Social History   Tobacco Use  . Smoking status: Former Smoker    Packs/day: 0.00  . Smokeless tobacco: Current User    Types: Snuff  Substance Use Topics  . Alcohol use: Yes  . Drug use: No    Review of Systems Difficult to obtain this completely because of his dementia Constitutional: No fever/chills Eyes: No visual changes. ENT: No sore throat. Cardiovascular: Denies chest pain. Respiratory: Denies shortness of breath. Gastrointestinal:  abdominal pain.  Possible nausea, no vomiting.  Unknown  diarrhea.  Unknown constipation. Genitourinary: Negative for dysuria. Musculoskeletal: Negative for back pain. Skin: Negative for rash. Neurological: Negative for headaches, focal weakness   ____________________________________________   PHYSICAL EXAM:  VITAL SIGNS: ED Triage Vitals  Enc Vitals Group     BP      Pulse      Resp      Temp      Temp src      SpO2      Weight      Height      Head Circumference      Peak Flow      Pain Score      Pain Loc      Pain Edu?      Excl. in [place]?     Constitutional: Alert and oriented to person and hospital.  Complaining of severe pain in his abdomen Eyes: Conjunctivae are normal.  Head: Atraumatic. Nose: No congestion/rhinnorhea. Mouth/Throat: Mucous membranes are moist.  Oropharynx non-erythematous. Neck: No stridor.   Cardiovascular: Rapid rate, regular rhythm. Grossly normal heart sounds.  Good peripheral circulation. Respiratory: Normal respiratory effort.  No retractions. Lungs CTAB. Gastrointestinal: Soft diffusely tender to palpation and percussion but not severely so distention. No abdominal bruits.  Musculoskeletal: No lower extremity tenderness nor edema.   Neurologic:  Normal speech and language. No gross focal neurologic deficits are appreciated. . Skin:  Skin is warm, dry and intact. No rash noted.   ____________________________________________   LABS (all labs ordered are listed, but only abnormal results are displayed)  Labs Reviewed  COMPREHENSIVE METABOLIC PANEL - Abnormal; Notable for the following components:      Result Value   Sodium 149 (*)    Potassium 3.2 (*)    Glucose, Bld 123 (*)    BUN 42 (*)    Total Protein 9.2 (*)    Albumin 3.0 (*)    Total Bilirubin 1.3 (*)    All other components within normal limits  BRAIN NATRIURETIC PEPTIDE - Abnormal; Notable for the following components:   B Natriuretic Peptide 256.9 (*)    All other components within normal limits  LACTIC ACID, PLASMA - Abnormal; Notable for the following components:   Lactic Acid, Venous 3.0 (*)    All other components within normal limits  CBC WITH DIFFERENTIAL/PLATELET - Abnormal; Notable for the following components:   MCV 100.6 (*)    Neutro Abs 8.1 (*)    Lymphs Abs 0.5 (*)    All other components within normal limits  BLOOD GAS, VENOUS - Abnormal; Notable for the following components:   Bicarbonate 31.4 (*)    Acid-Base Excess 4.0 (*)    All other components within normal limits  PROTIME-INR - Abnormal; Notable for  the following components:   Prothrombin Time 15.9 (*)    INR 1.3 (*)    All other components within normal limits  TROPONIN I (HIGH SENSITIVITY) - Abnormal; Notable for the following components:   Troponin I (High Sensitivity) 54 (*)    All other components within normal limits  TROPONIN I (HIGH SENSITIVITY) - Abnormal; Notable for the following components:   Troponin I (High Sensitivity) 54 (*)    All other components within normal limits  CULTURE, BLOOD (SINGLE)  URINE CULTURE  RESP PANEL BY RT-PCR (FLU A&B, COVID) ARPGX2  LIPASE, BLOOD  LACTIC ACID, PLASMA  APTT  URINALYSIS, COMPLETE (UACMP) WITH MICROSCOPIC   ____________________________________________  EKG  EKG read interpreted by me shows a flutter with 2-1 conduction  rate of 139 normal axis likely rate related EKG changes.  We will give the patient some fluid since he is apparently dehydrated before right try to reduce his heart rate.  I do not want to make him hypotensive. ____________________________________________  RADIOLOGY Gertha Calkin, personally viewed and evaluated these images (plain radiographs) as part of my medical decision making, as well as reviewing the written report by the radiologist.  ED MD interpretation: CT looks like small bowel obstruction.  Radiology calls back and speaks with me.  Confirms small bowel obstruction with transition point at an inguinal hernia and also looks like a closed-loop obstruction in the hernia.  Official radiology report(s): DG Chest 2 View  Result Date: 05/26/2020 CLINICAL DATA:  We, altered mental status. Abdominal pain. Decreased appetite and mobility. EXAM: CHEST - 2 VIEW COMPARISON:  Chest x-ray 04/20/2020 FINDINGS: The heart size and mediastinal contours are unchanged. Aortic arch calcification. No focal consolidation. No pulmonary edema. No pleural effusion. No pneumothorax. No acute osseous abnormality. IMPRESSION: No active cardiopulmonary disease. Electronically  Signed   By: Iven Finn M.D.   On: 05/26/2020 20:09   CT ABDOMEN PELVIS W CONTRAST  Result Date: 05/26/2020 CLINICAL DATA:  Abdominal distension, abdominal pain. Decreased appetite and mobility EXAM: CT ABDOMEN AND PELVIS WITH CONTRAST TECHNIQUE: Multidetector CT imaging of the abdomen and pelvis was performed using the standard protocol following bolus administration of intravenous contrast. CONTRAST:  135mL OMNIPAQUE IOHEXOL 300 MG/ML  SOLN COMPARISON:  CT abdomen pelvis 10/31/2017. FINDINGS: Lower chest: Elevated left hemidiaphragm with compressive changes at the left base. Fluid noted within the esophageal lumen. Coronary artery calcifications. Hepatobiliary: Redemonstration of an ill-defined hypodense right hepatic lesion measuring 3.2 x 2.6 cm (from 3.9 x 3.3 cm). Persistent calcific density along the peripheral right hepatic dome the could represent sequela of prior granulomatous disease. No gallstones, gallbladder wall thickening, or pericholecystic fluid. No biliary dilatation. Pancreas: No focal lesion. Normal pancreatic contour. No surrounding inflammatory changes. No main pancreatic ductal dilatation. Spleen: Normal in size without focal abnormality. Adrenals/Urinary Tract: No adrenal nodule bilaterally. Bilateral kidneys enhance symmetrically. No hydronephrosis. No hydroureter. On delayed imaging, there is no urothelial wall thickening and there are no filling defects in the opacified portions of the bilateral collecting systems or ureters. The urinary bladder is unremarkable. Stomach/Bowel: The gastric lumen is dilated with fluid. Markedly proximal and mid dilated small bowel loops with a transition point at the entrance of the right inguinal hernia (11:84, 5:40). The short segment of small bowel within the right inguinal hernia also is noted to be distended with an air-fluid level within other likely transition point at the exit of the small bowel. The small bowel distally is completely  collapsed. Stool within the colon. No small or large bowel wall thickening. No large bowel dilatation. No definite pneumatosis. Vascular/Lymphatic: No abdominal aorta or iliac aneurysm. Severe atherosclerotic plaque of the aorta and its branches. No abdominal, pelvic, or inguinal lymphadenopathy. Reproductive: Multiple round metallic densities within and surrounding the prostate gland consistent with radioactive seeds. Prostate is enlarged measuring up to at least 5 cm. Other: No intraperitoneal free fluid. No intraperitoneal free gas. No organized fluid collection. Musculoskeletal: Severe soft tissue edema. Redemonstration of multiple fat density lesion within superficial anterior abdominal subcutaneus soft tissues that likely represent lipomatous lesions. The largest measures 1.5 x 0.9 cm (11:23). Redemonstration (new from 10/31/2017, retrospectively visualized on 04/20/2020) of a lytic lesion within the L2 vertebral body with an associated pathologic fracture of  the superior endplate (5: 68, 40:34). Otherwise no new acute displaced fracture. Old healed left rib fractures. Multilevel degenerative changes of the spine. IMPRESSION: 1. High-grade small bowel obstruction with a transition point at the entrance of a right inguinal hernia. No bowel perforation. Recommend surgical consultation. 2. Additional closed-loop obstruction of a short loop of small bowel contained within the right inguinal hernia suggesting possible incarceration. Recommend surgical consultation. 3. Fluid within the esophageal lumen with a distended gastric lumen. Recommend enteric tube placement. 4. Indeterminate L2 lytic lesion with associated pathologic superior endplate fracture. 5. Indeterminate 3.2 cm ill-defined hypodense lesion within the right hepatic lobe. Consider MRI liver protocol for further evaluation. 6. Prostatomegaly with associated radioactive seeds. 7. Other imaging findings of potential clinical significance: Multiple fat  density lesions within superficial anterior abdominal subcutaneus soft tissues that likely represent lipomatous lesions. Aortic Atherosclerosis (ICD10-I70.0). These results were called by telephone at the time of interpretation on 05/26/2020 at 10:30 pm to provider [facility] , who verbally acknowledged these results. Electronically Signed   By: Iven Finn M.D.   On: 05/26/2020 22:45    ____________________________________________   PROCEDURES  Procedure(s) performed (including Critical Care): Medical care time 45 minutes this includes repeatedly evaluating the patient and checking on his labs and CT.  I am calling the surgeon currently.  Procedures critical care time 45 minutes.  This includes preparing to sedate the patient and a good deal of time spent at bedside reducing the hernia and working on the hypotension with fluids and pushing the Narcan myself.   ____________________________________________   INITIAL IMPRESSION / [place] / ED COURSE  Patient with apparently several days of abdominal pain likely with the small bowel obstruction starting several days ago.  I am giving him antibiotics we will get him to the surgeon and to internal medicine for clearance prior to surgery.  He does have a flutter with 2-1 conduction.  Heart rate has come down with diltiazem.  Last set of vital signs in the computer says his heart rate was 53 and respirations are 9.  I just and in the room this is not true his heart rate is averaging about 115 respiratory rate of 13.  He is 100% sat.     ----------------------------------------- 12:40 AM on 05/27/2020 -----------------------------------------  I had discussed the patient with Dr. Constance Holster and we were trying to reduce the hernia giving him some morphine and then Dilaudid.  This did not work as the patient also dated from the pain medicine would immediately wake up and reached down to interfere with me trying to reduce his hernia.   Patient then became somewhat hypotensive and then more hypotensive as we were preparing to sedate him further.  Hypotension responded to fluids and half an amp of Narcan with pressure going back up to 742 systolic.  Dr. Lutricia Feil has come in and examined the patient feels that the hernia is best fixed surgically and that hopefully this will fix the small bowel obstruction as well.  He is taking the patient to the OR.  Dr. Damita Dunnings has been here working on the patient with me.  She is going to put in admission orders.  Since the patient's blood pressures back up we will try and slow his heart rate with some diltiazem and continue the third liter of fluid.  Heart rate is currently in the 130s.       ____________________________________________   FINAL CLINICAL IMPRESSION(S) / ED DIAGNOSES  Final diagnoses:  Generalized  abdominal pain  Small bowel obstruction (HCC)  Atrial flutter, unspecified type [facility])     ED Discharge Orders    None      *Please note:  Craig [name] was evaluated in Emergency Department on 05/27/2020 for the symptoms described in the history of present illness. He was evaluated in the context of the global COVID-19 pandemic, which necessitated consideration that the patient might be at risk for infection with the SARS-CoV-2 virus that causes COVID-19. Institutional protocols and algorithms that pertain to the evaluation of patients at risk for COVID-19 are in a state of rapid change based on information released by regulatory bodies including the CDC and federal and state organizations. These policies and algorithms were followed during the patient's care in the ED.  Some ED evaluations and interventions may be delayed as a result of limited staffing during and the pandemic.*   Note:  This document was prepared using Dragon voice recognition software and may include unintentional dictation errors.    Nena Polio, MD 05/26/20 2236    Nena Polio,  MD 05/27/20 [phone]

## 2020-05-26 NOTE — ED Notes (Signed)
Received call from CT. Pt agitated and having a hard time holding still for the scan. Dr Cinda Quest notified. Received verbal orders for 4mg  morphine and 2mg  ativan if morphine did not work.

## 2020-05-26 NOTE — H&P (Signed)
History and Physical    Craig [name] TIR:[id number] DOB: 1940-11-18 DOA: 05/26/2020  PCP: [name], MD   Patient coming from: Home  I have personally briefly reviewed patient's old medical records in [place]  Chief Complaint: Abdominal pain  HPI: Craig [name] is a 79 y.o. male with medical history significant for CAD with history of CABG in the 1990s, a flutter, HTN, history of TIA, who previously lived alone and now lives with daughter, recently hospitalized in November 2021 with altered mental status and agitation related to dementia and hyponatremia with stay complicated by a flutter with slow ventricular response, discharged on 11/8, returning to the ED on 11/30 with a concern for ingestion, who now returns to the emergency room with a several day history of not acting himself and with a complaint of abdominal pain.  He has had no nausea vomiting or fever or chills.  History is limited due to dementia and is taken from the emergency room records and prior history provided by daughter. ED Course: On arrival, he was afebrile with BP 132/65 heart rate 118 and respirations 22.  Blood work significant for normal WBC of 9400, normal hemoglobin of 14.6.  Lactic acid was 3.0.  Chemistry significant for sodium of 149, potassium 3.2, liver enzymes unremarkable, total bili 1.3, lipase 42, troponin 54, BNP 256. EKG as reviewed by me : Atrial flutter at 139 CT abdomen and pelvis showed several abnormalities including high-grade small bowel obstruction with a transition point at entrance of a right inguinal hernia with recommendation for surgical consultation as well as a right inguinal hernia closed-loop obstruction suggesting possible incarceration.  Also showed indeterminate L2 lytic lesion hypodense lesion within the liver and other findings. By the time of my evaluation patient was drowsy due to morphine and Ativan administered for CAT scan and was unable to actively  participate in history taking   Review of Systems: Unable to obtain due to dementia and clinical situation  Past Medical History:  Diagnosis Date  . Atrial flutter ([place])   . Coronary artery disease   . Hyperlipidemia   . Hypertension   . TIA (transient ischemic attack)     Past Surgical History:  Procedure Laterality Date  . arm surgery Left   . HERNIA REPAIR       reports that he has quit smoking. He smoked 0.00 packs per day. His smokeless tobacco use includes snuff. He reports current alcohol use. He reports that he does not use drugs.  Allergies  Allergen Reactions  . Penicillins     History reviewed. No pertinent family history.    Prior to Admission medications   Medication Sig Start Date End Date Taking? Authorizing Provider  aspirin EC 81 MG tablet Take 81 mg by mouth daily. Swallow whole.   Yes [provider]  lisinopril (ZESTRIL) 20 MG tablet Take 1 tablet (20 mg total) by mouth daily. 04/26/20  Yes Lorella Nimrod, MD  Multiple Vitamin (MULTIVITAMIN WITH MINERALS) TABS tablet Take 1 tablet by mouth daily. 04/27/20  Yes Lorella Nimrod, MD  donepezil (ARICEPT) 10 MG tablet TK 1 T PO HS FOR MEMORY Patient not taking: No sig reported 12/18/17   [provider]  feeding supplement (ENSURE ENLIVE / ENSURE PLUS) LIQD Take 237 mLs by mouth 3 (three) times daily between meals. 04/26/20   Lorella Nimrod, MD  oxybutynin (DITROPAN) 5 MG tablet Take 5 mg by mouth at bedtime. Patient not taking: Reported on 05/26/2020 02/05/20  [provider]    Physical Exam: Vitals:   05/26/20 1943 05/26/20 1950 05/26/20 2215  BP: 108/88  110/72  Pulse: (!) 130  (!) 53  Resp:   (!) 9  Temp:  98 F (36.7 C)   TempSrc:  Oral   SpO2: 98%  100%     Vitals:   05/26/20 1943 05/26/20 1950 05/26/20 2215  BP: 108/88  110/72  Pulse: (!) 130  (!) 53  Resp:   (!) 9  Temp:  98 F (36.7 C)   TempSrc:  Oral   SpO2: 98%  100%      Constitutional:  Somnolent,  arousable with shaking but readily falls back asleep yet restless in bed  HEENT:      Head: Normocephalic and atraumatic.         Eyes: PERLA, EOMI, Conjunctivae are normal. Sclera is non-icteric.       Mouth/Throat:  Unable to assess due to poor patient cooperation      Neck: Supple with no signs of meningismus. Cardiovascular:  Tachycardic. No murmurs, gallops, or rubs. 2+ symmetrical distal pulses are present . No JVD. No LE edema Respiratory: Respiratory effort normal .Lungs sounds clear bilaterally. No wheezes, crackles, or rhonchi.  Gastrointestinal:  Distended, diffuse tenderness based on grimace positive bowel sounds.  Genitourinary: No CVA tenderness. Musculoskeletal: Nontender with normal range of motion in all extremities. No cyanosis, or erythema of extremities. Neurologic:  Face is symmetric. Moving all extremities. No gross focal neurologic deficits . Skin: Skin is warm, dry.  No rash or ulcers Psychiatric: Unable to assess adequately due to prior medications administered   Labs on Admission: I have personally reviewed following labs and imaging studies  CBC: Recent Labs  Lab 05/26/20 2024  WBC 9.4  NEUTROABS 8.1*  HGB 14.6  HCT 47.5  MCV 100.6*  PLT 578   Basic Metabolic Panel: Recent Labs  Lab 05/26/20 2024  NA 149*  K 3.2*  CL 105  CO2 31  GLUCOSE 123*  BUN 42*  CREATININE 0.89  CALCIUM 10.0   GFR: Estimated Creatinine Clearance: 73.5 mL/min (by C-G formula based on SCr of 0.89 mg/dL). Liver Function Tests: Recent Labs  Lab 05/26/20 2024  AST 33  ALT 24  ALKPHOS 53  BILITOT 1.3*  PROT 9.2*  ALBUMIN 3.0*   Recent Labs  Lab 05/26/20 2024  LIPASE 42   No results for input(s): AMMONIA in the last 168 hours. Coagulation Profile: No results for input(s): INR, PROTIME in the last 168 hours. Cardiac Enzymes: No results for input(s): CKTOTAL, CKMB, CKMBINDEX, TROPONINI in the last 168 hours. BNP (last 3 results) No results for input(s): PROBNP  in the last 8760 hours. HbA1C: No results for input(s): HGBA1C in the last 72 hours. CBG: No results for input(s): GLUCAP in the last 168 hours. Lipid Profile: No results for input(s): CHOL, HDL, LDLCALC, TRIG, CHOLHDL, LDLDIRECT in the last 72 hours. Thyroid Function Tests: No results for input(s): TSH, T4TOTAL, FREET4, T3FREE, THYROIDAB in the last 72 hours. Anemia Panel: No results for input(s): VITAMINB12, FOLATE, FERRITIN, TIBC, IRON, RETICCTPCT in the last 72 hours. Urine analysis:    Component Value Date/Time   COLORURINE YELLOW (A) 04/26/2020 1120   APPEARANCEUR HAZY (A) 04/26/2020 1120   LABSPEC 1.012 04/26/2020 1120   PHURINE 7.0 04/26/2020 1120   GLUCOSEU NEGATIVE 04/26/2020 1120   HGBUR NEGATIVE 04/26/2020 1120   BILIRUBINUR NEGATIVE 04/26/2020 [place] 04/26/2020 1120   PROTEINUR NEGATIVE 04/26/2020  [place] 04/26/2020 [place] 04/26/2020 1120    Radiological Exams on Admission: DG Chest 2 View  Result Date: 05/26/2020 CLINICAL DATA:  We, altered mental status. Abdominal pain. Decreased appetite and mobility. EXAM: CHEST - 2 VIEW COMPARISON:  Chest x-ray 04/20/2020 FINDINGS: The heart size and mediastinal contours are unchanged. Aortic arch calcification. No focal consolidation. No pulmonary edema. No pleural effusion. No pneumothorax. No acute osseous abnormality. IMPRESSION: No active cardiopulmonary disease. Electronically Signed   By: Iven Finn M.D.   On: 05/26/2020 20:09   CT ABDOMEN PELVIS W CONTRAST  Result Date: 05/26/2020 CLINICAL DATA:  Abdominal distension, abdominal pain. Decreased appetite and mobility EXAM: CT ABDOMEN AND PELVIS WITH CONTRAST TECHNIQUE: Multidetector CT imaging of the abdomen and pelvis was performed using the standard protocol following bolus administration of intravenous contrast. CONTRAST:  136mL OMNIPAQUE IOHEXOL 300 MG/ML  SOLN COMPARISON:  CT abdomen pelvis 10/31/2017.  FINDINGS: Lower chest: Elevated left hemidiaphragm with compressive changes at the left base. Fluid noted within the esophageal lumen. Coronary artery calcifications. Hepatobiliary: Redemonstration of an ill-defined hypodense right hepatic lesion measuring 3.2 x 2.6 cm (from 3.9 x 3.3 cm). Persistent calcific density along the peripheral right hepatic dome the could represent sequela of prior granulomatous disease. No gallstones, gallbladder wall thickening, or pericholecystic fluid. No biliary dilatation. Pancreas: No focal lesion. Normal pancreatic contour. No surrounding inflammatory changes. No main pancreatic ductal dilatation. Spleen: Normal in size without focal abnormality. Adrenals/Urinary Tract: No adrenal nodule bilaterally. Bilateral kidneys enhance symmetrically. No hydronephrosis. No hydroureter. On delayed imaging, there is no urothelial wall thickening and there are no filling defects in the opacified portions of the bilateral collecting systems or ureters. The urinary bladder is unremarkable. Stomach/Bowel: The gastric lumen is dilated with fluid. Markedly proximal and mid dilated small bowel loops with a transition point at the entrance of the right inguinal hernia (11:84, 5:40). The short segment of small bowel within the right inguinal hernia also is noted to be distended with an air-fluid level within other likely transition point at the exit of the small bowel. The small bowel distally is completely collapsed. Stool within the colon. No small or large bowel wall thickening. No large bowel dilatation. No definite pneumatosis. Vascular/Lymphatic: No abdominal aorta or iliac aneurysm. Severe atherosclerotic plaque of the aorta and its branches. No abdominal, pelvic, or inguinal lymphadenopathy. Reproductive: Multiple round metallic densities within and surrounding the prostate gland consistent with radioactive seeds. Prostate is enlarged measuring up to at least 5 cm. Other: No intraperitoneal  free fluid. No intraperitoneal free gas. No organized fluid collection. Musculoskeletal: Severe soft tissue edema. Redemonstration of multiple fat density lesion within superficial anterior abdominal subcutaneus soft tissues that likely represent lipomatous lesions. The largest measures 1.5 x 0.9 cm (11:23). Redemonstration (new from 10/31/2017, retrospectively visualized on 04/20/2020) of a lytic lesion within the L2 vertebral body with an associated pathologic fracture of the superior endplate (5: 68, 16:10). Otherwise no new acute displaced fracture. Old healed left rib fractures. Multilevel degenerative changes of the spine. IMPRESSION: 1. High-grade small bowel obstruction with a transition point at the entrance of a right inguinal hernia. No bowel perforation. Recommend surgical consultation. 2. Additional closed-loop obstruction of a short loop of small bowel contained within the right inguinal hernia suggesting possible incarceration. Recommend surgical consultation. 3. Fluid within the esophageal lumen with a distended gastric lumen. Recommend enteric tube placement. 4. Indeterminate L2 lytic lesion with associated pathologic superior endplate fracture. 5.  Indeterminate 3.2 cm ill-defined hypodense lesion within the right hepatic lobe. Consider MRI liver protocol for further evaluation. 6. Prostatomegaly with associated radioactive seeds. 7. Other imaging findings of potential clinical significance: Multiple fat density lesions within superficial anterior abdominal subcutaneus soft tissues that likely represent lipomatous lesions. Aortic Atherosclerosis (ICD10-I70.0). These results were called by telephone at the time of interpretation on 05/26/2020 at 10:30 pm to provider [facility] , who verbally acknowledged these results. Electronically Signed   By: Iven Finn M.D.   On: 05/26/2020 22:45     Assessment/Plan 79 year old male with history of CAD with history of CABG in the 1990s, a flutter,  HTN, history of TIA, who previously lived alone and now lives with daughter, recently hospitalized 11/2-11/8  with altered mental status and agitation presenting with abdominal pain, decreased appetite and decreased mobility    SBO (small bowel obstruction) (HCC) secondary to incarcerated hernia -CT abdomen and pelvis suggesting incarcerated hernia -Surgical consult -NG tube to low intermittent suction and further instructions per surgery -IV hydration, pain control and IV antiemetics -Empiric Zosyn --Dr Christian Mate to take patient to OR as discussed upon his evaluation  Lactic acidosis Recent ingestion -Likely related to intestinal obstruction above.  WBC within normal limits -Management as outlined above -Patient was evaluated in the ED on 11/30 for possible ingestion of Drano after daughter found him with a bottle of Drano beside him. -Venous pH fair    CAD (coronary artery disease), native coronary artery   History of coronary artery stent placement -No complaints of chest pain -Hold home meds for now    Essential hypertension -Labetalol as needed BP over 160/90 while patient n.p.o.  L2 lytic lesion on CT -Oncology and patient to outpatient    Acute metabolic encephalopathy superimposed on dementia with behavioral disturbance ([place]) -Patient was apparently at baseline until a few days prior when he stopped eating drinking -Patient was agitated in the emergency room requiring morphine and Ativan -During recent past hospitalization and had episodes of agitation -Monitor closely    Atrial flutter with rapid ventricular response ([place]) -EKG with a flutter with rate of 139 -Patient had transient bradycardia down to 35 during hospitalization in November -Based on above we will not treat aggressively.  Rate likely related to acute medical problem -Continue hydration -Per chart review, patient has apparently refused EP ablation in the past -Not currently on anticoagulation due to  being a poor candidate due cognitive status, concern for nonadherence   Hypernatremia -Likely related to intestinal fluid losses -Patient was hyponatremic last admission related to thiazide diuretic -We will monitor for now     DVT prophylaxis: SCDs Code Status: full code  Family Communication:  none  Disposition Plan: Back to previous home environment Consults called:   Status:At the time of admission, it appears that the appropriate admission status for this patient is INPATIENT. This is judged to be reasonable and necessary in order to provide the required intensity of service to ensure the patient's safety given the presenting symptoms, physical exam findings, and initial radiographic and laboratory data in the context of their  Comorbid conditions.   Patient requires inpatient status due to high intensity of service, high risk for further deterioration and high frequency of surveillance required.   I certify that at the point of admission it is my clinical judgment that the patient will require inpatient hospital care spanning beyond [place] MD [facility]     05/26/2020, 11:04  PM

## 2020-05-26 NOTE — ED Notes (Signed)
Date and time results received: 05/26/20    Test: Lactic Acid   Critical Value: 3.0  Name of Provider Notified: Cinda Quest, MD

## 2020-05-26 NOTE — ED Triage Notes (Signed)
Pt to ED via [facility]. Pt came from home; lives with daughter. Pt c/o of abdominal pain. Pt daughter states that pt has had a decrease in apetite and mobility. Pt has dementia. Pt has 20 gauge in left AC.  EMS vitals: HR 130 BP 139/86 CBG 172 20 [place] TEMP 98.2

## 2020-05-27 ENCOUNTER — Inpatient Hospital Stay: Payer: Medicare (Managed Care)

## 2020-05-27 ENCOUNTER — Inpatient Hospital Stay: Payer: Medicare (Managed Care) | Admitting: Registered Nurse

## 2020-05-27 ENCOUNTER — Encounter
Admission: EM | Disposition: A | Discharge status: Skilled Nursing Facility | Payer: Self-pay | Source: Home / Self Care | Attending: Internal Medicine

## 2020-05-27 ENCOUNTER — Encounter: Payer: Self-pay | Admitting: Internal Medicine

## 2020-05-27 DIAGNOSIS — N491 Inflammatory disorders of spermatic cord, tunica vaginalis and vas deferens: Secondary | ICD-10-CM | POA: Diagnosis present

## 2020-05-27 DIAGNOSIS — I25118 Atherosclerotic heart disease of native coronary artery with other forms of angina pectoris: Secondary | ICD-10-CM | POA: Diagnosis present

## 2020-05-27 DIAGNOSIS — K56609 Unspecified intestinal obstruction, unspecified as to partial versus complete obstruction: Secondary | ICD-10-CM | POA: Diagnosis not present

## 2020-05-27 DIAGNOSIS — R1084 Generalized abdominal pain: Secondary | ICD-10-CM | POA: Diagnosis not present

## 2020-05-27 DIAGNOSIS — J9601 Acute respiratory failure with hypoxia: Secondary | ICD-10-CM | POA: Diagnosis not present

## 2020-05-27 DIAGNOSIS — F0391 Unspecified dementia with behavioral disturbance: Secondary | ICD-10-CM | POA: Diagnosis present

## 2020-05-27 DIAGNOSIS — E43 Unspecified severe protein-calorie malnutrition: Secondary | ICD-10-CM | POA: Diagnosis present

## 2020-05-27 DIAGNOSIS — E785 Hyperlipidemia, unspecified: Secondary | ICD-10-CM | POA: Diagnosis present

## 2020-05-27 DIAGNOSIS — F0281 Dementia in other diseases classified elsewhere with behavioral disturbance: Secondary | ICD-10-CM

## 2020-05-27 DIAGNOSIS — I340 Nonrheumatic mitral (valve) insufficiency: Secondary | ICD-10-CM | POA: Diagnosis not present

## 2020-05-27 DIAGNOSIS — N448 Other noninflammatory disorders of the testis: Secondary | ICD-10-CM

## 2020-05-27 DIAGNOSIS — I429 Cardiomyopathy, unspecified: Secondary | ICD-10-CM | POA: Diagnosis present

## 2020-05-27 DIAGNOSIS — I110 Hypertensive heart disease with heart failure: Secondary | ICD-10-CM | POA: Diagnosis present

## 2020-05-27 DIAGNOSIS — E872 Acidosis: Secondary | ICD-10-CM | POA: Diagnosis present

## 2020-05-27 DIAGNOSIS — I4892 Unspecified atrial flutter: Secondary | ICD-10-CM

## 2020-05-27 DIAGNOSIS — K4040 Unilateral inguinal hernia, with gangrene, not specified as recurrent: Principal | ICD-10-CM

## 2020-05-27 DIAGNOSIS — I959 Hypotension, unspecified: Secondary | ICD-10-CM | POA: Diagnosis present

## 2020-05-27 DIAGNOSIS — I5023 Acute on chronic systolic (congestive) heart failure: Secondary | ICD-10-CM | POA: Diagnosis present

## 2020-05-27 DIAGNOSIS — Z951 Presence of aortocoronary bypass graft: Secondary | ICD-10-CM | POA: Diagnosis not present

## 2020-05-27 DIAGNOSIS — I252 Old myocardial infarction: Secondary | ICD-10-CM | POA: Diagnosis not present

## 2020-05-27 DIAGNOSIS — Z20822 Contact with and (suspected) exposure to covid-19: Secondary | ICD-10-CM | POA: Diagnosis present

## 2020-05-27 DIAGNOSIS — I5020 Unspecified systolic (congestive) heart failure: Secondary | ICD-10-CM | POA: Diagnosis not present

## 2020-05-27 DIAGNOSIS — I700 Atherosclerosis of aorta: Secondary | ICD-10-CM | POA: Diagnosis present

## 2020-05-27 DIAGNOSIS — K567 Ileus, unspecified: Secondary | ICD-10-CM | POA: Diagnosis not present

## 2020-05-27 DIAGNOSIS — D2931 Benign neoplasm of right epididymis: Secondary | ICD-10-CM | POA: Diagnosis not present

## 2020-05-27 DIAGNOSIS — Z955 Presence of coronary angioplasty implant and graft: Secondary | ICD-10-CM | POA: Diagnosis not present

## 2020-05-27 DIAGNOSIS — I2510 Atherosclerotic heart disease of native coronary artery without angina pectoris: Secondary | ICD-10-CM | POA: Diagnosis not present

## 2020-05-27 DIAGNOSIS — K631 Perforation of intestine (nontraumatic): Secondary | ICD-10-CM

## 2020-05-27 DIAGNOSIS — E870 Hyperosmolality and hypernatremia: Secondary | ICD-10-CM | POA: Diagnosis present

## 2020-05-27 DIAGNOSIS — Z8673 Personal history of transient ischemic attack (TIA), and cerebral infarction without residual deficits: Secondary | ICD-10-CM | POA: Diagnosis not present

## 2020-05-27 DIAGNOSIS — I471 Supraventricular tachycardia: Secondary | ICD-10-CM | POA: Diagnosis present

## 2020-05-27 DIAGNOSIS — R109 Unspecified abdominal pain: Secondary | ICD-10-CM | POA: Diagnosis present

## 2020-05-27 DIAGNOSIS — G9341 Metabolic encephalopathy: Secondary | ICD-10-CM | POA: Diagnosis present

## 2020-05-27 DIAGNOSIS — E876 Hypokalemia: Secondary | ICD-10-CM | POA: Diagnosis not present

## 2020-05-27 DIAGNOSIS — N400 Enlarged prostate without lower urinary tract symptoms: Secondary | ICD-10-CM | POA: Diagnosis present

## 2020-05-27 DIAGNOSIS — I4891 Unspecified atrial fibrillation: Secondary | ICD-10-CM | POA: Diagnosis not present

## 2020-05-27 HISTORY — PX: ORCHIECTOMY: SHX2116

## 2020-05-27 HISTORY — PX: BOWEL RESECTION: SHX1257

## 2020-05-27 HISTORY — PX: INGUINAL HERNIA REPAIR: SHX194

## 2020-05-27 LAB — CBC
HCT: 42.4 % (ref 39.0–52.0)
Hemoglobin: 12.7 g/dL — ABNORMAL LOW (ref 13.0–17.0)
MCH: 31.2 pg (ref 26.0–34.0)
MCHC: 30 g/dL (ref 30.0–36.0)
MCV: 104.2 fL — ABNORMAL HIGH (ref 80.0–100.0)
Platelets: 178 10*3/uL (ref 150–400)
RBC: 4.07 MIL/uL — ABNORMAL LOW (ref 4.22–5.81)
RDW: 12.5 % (ref 11.5–15.5)
WBC: 4.7 10*3/uL (ref 4.0–10.5)
nRBC: 0.6 % — ABNORMAL HIGH (ref 0.0–0.2)

## 2020-05-27 LAB — BASIC METABOLIC PANEL
Anion gap: 12 (ref 5–15)
BUN: 42 mg/dL — ABNORMAL HIGH (ref 8–23)
CO2: 25 mmol/L (ref 22–32)
Calcium: 9.4 mg/dL (ref 8.9–10.3)
Chloride: 109 mmol/L (ref 98–111)
Creatinine, Ser: 0.9 mg/dL (ref 0.61–1.24)
GFR, Estimated: >60 mL/min (ref >60)
Glucose, Bld: 122 mg/dL — ABNORMAL HIGH (ref 70–99)
Potassium: 3.4 mmol/L — ABNORMAL LOW (ref 3.5–5.1)
Sodium: 146 mmol/L — ABNORMAL HIGH (ref 135–145)

## 2020-05-27 LAB — PHOSPHORUS: Phosphorus: 2.8 mg/dL (ref 2.5–4.6)

## 2020-05-27 LAB — GLUCOSE, CAPILLARY
Glucose-Capillary: 86 mg/dL (ref 70–99)
Glucose-Capillary: 88 mg/dL (ref 70–99)

## 2020-05-27 LAB — RESP PANEL BY RT-PCR (FLU A&B, COVID) ARPGX2
Influenza A by PCR: NEGATIVE
Influenza B by PCR: NEGATIVE
SARS Coronavirus 2 by RT PCR: NEGATIVE

## 2020-05-27 LAB — PROCALCITONIN: Procalcitonin: 0.25 ng/mL

## 2020-05-27 LAB — MAGNESIUM: Magnesium: 2.2 mg/dL (ref 1.7–2.4)

## 2020-05-27 LAB — MRSA PCR SCREENING: MRSA by PCR: NEGATIVE

## 2020-05-27 SURGERY — REPAIR, HERNIA, INGUINAL, INCARCERATED
Anesthesia: General | Laterality: Right

## 2020-05-27 MED ORDER — ACETAMINOPHEN 325 MG PO TABS
650.0000 mg | ORAL_TABLET | Freq: Four times a day (QID) | ORAL | Status: DC | PRN
  Administered 2020-06-07 – 2020-06-10 (×2): 650 mg via ORAL
  Filled 2020-05-27 (×3): qty 2

## 2020-05-27 MED ORDER — ONDANSETRON HCL 4 MG/2ML IJ SOLN
INTRAMUSCULAR | Status: DC | PRN
  Administered 2020-05-27: 4 mg via INTRAVENOUS

## 2020-05-27 MED ORDER — DEXTROSE 5 % IV SOLN: INTRAVENOUS | Status: DC | PRN

## 2020-05-27 MED ORDER — GLYCOPYRROLATE 0.2 MG/ML IJ SOLN
INTRAMUSCULAR | Status: DC | PRN
  Administered 2020-05-27: .2 mg via INTRAVENOUS

## 2020-05-27 MED ORDER — ROCURONIUM BROMIDE 100 MG/10ML IV SOLN
INTRAVENOUS | Status: DC | PRN
  Administered 2020-05-27: 30 mg via INTRAVENOUS
  Administered 2020-05-27: 50 mg via INTRAVENOUS
  Administered 2020-05-27: 20 mg via INTRAVENOUS

## 2020-05-27 MED ORDER — SODIUM CHLORIDE 0.9 % IV SOLN
INTRAVENOUS | Status: DC | PRN
  Administered 2020-05-27: 30 ug/min via INTRAVENOUS

## 2020-05-27 MED ORDER — LACTATED RINGERS IV BOLUS: 1000.0000 mL | Freq: Once | INTRAVENOUS | Status: DC

## 2020-05-27 MED ORDER — BUPIVACAINE LIPOSOME 1.3 % IJ SUSP
INTRAMUSCULAR | Status: DC | PRN
  Administered 2020-05-27: 20 mL

## 2020-05-27 MED ORDER — ACETAMINOPHEN 650 MG RE SUPP: 650.0000 mg | Freq: Four times a day (QID) | RECTAL | Status: DC | PRN

## 2020-05-27 MED ORDER — PROPOFOL 10 MG/ML IV BOLUS
INTRAVENOUS | Status: DC | PRN
  Administered 2020-05-27: 100 mg via INTRAVENOUS

## 2020-05-27 MED ORDER — DEXMEDETOMIDINE HCL IN NACL 400 MCG/100ML IV SOLN
0.4000 ug/kg/h | INTRAVENOUS | Status: DC
  Filled 2020-05-27: qty 100

## 2020-05-27 MED ORDER — VASOPRESSIN 20 UNIT/ML IV SOLN
INTRAVENOUS | Status: DC | PRN
  Administered 2020-05-27 (×3): 2 [IU] via INTRAVENOUS

## 2020-05-27 MED ORDER — FENTANYL BOLUS VIA INFUSION
25.0000 ug | INTRAVENOUS | Status: DC | PRN
  Filled 2020-05-27: qty 25

## 2020-05-27 MED ORDER — FENTANYL CITRATE (PF) 100 MCG/2ML IJ SOLN
25.0000 ug | Freq: Once | INTRAMUSCULAR | Status: AC
  Administered 2020-05-27: 25 ug via INTRAVENOUS

## 2020-05-27 MED ORDER — ALBUMIN HUMAN 5 % IV SOLN
INTRAVENOUS | Status: AC
  Filled 2020-05-27: qty 500

## 2020-05-27 MED ORDER — FENTANYL 2500MCG IN NS 250ML (10MCG/ML) PREMIX INFUSION
25.0000 ug/h | INTRAVENOUS | Status: DC
  Administered 2020-05-27: 25 ug/h via INTRAVENOUS
  Filled 2020-05-27: qty 250

## 2020-05-27 MED ORDER — ROCURONIUM BROMIDE 10 MG/ML (PF) SYRINGE
PREFILLED_SYRINGE | INTRAVENOUS | Status: AC
  Filled 2020-05-27: qty 10

## 2020-05-27 MED ORDER — SODIUM CHLORIDE 0.9 % IV SOLN
250.0000 mL | INTRAVENOUS | Status: DC
  Administered 2020-05-27 – 2020-06-01 (×3): 250 mL via INTRAVENOUS

## 2020-05-27 MED ORDER — MIDAZOLAM HCL 2 MG/2ML IJ SOLN: 1.0000 mg | INTRAMUSCULAR | Status: DC | PRN

## 2020-05-27 MED ORDER — KETAMINE HCL 10 MG/ML IJ SOLN: 1.0000 mg/kg | Freq: Once | INTRAMUSCULAR | Status: DC

## 2020-05-27 MED ORDER — FENTANYL CITRATE (PF) 100 MCG/2ML IJ SOLN
INTRAMUSCULAR | Status: AC
  Filled 2020-05-27: qty 2

## 2020-05-27 MED ORDER — METRONIDAZOLE IN NACL 5-0.79 MG/ML-% IV SOLN
500.0000 mg | Freq: Once | INTRAVENOUS | Status: AC
  Administered 2020-05-27: 500 mg via INTRAVENOUS
  Filled 2020-05-27: qty 100

## 2020-05-27 MED ORDER — KETAMINE HCL 10 MG/ML IJ SOLN
INTRAMUSCULAR | Status: AC
  Filled 2020-05-27: qty 1

## 2020-05-27 MED ORDER — BUPIVACAINE LIPOSOME 1.3 % IJ SUSP
INTRAMUSCULAR | Status: AC
  Filled 2020-05-27: qty 20

## 2020-05-27 MED ORDER — POTASSIUM CHLORIDE 10 MEQ/100ML IV SOLN
10.0000 meq | INTRAVENOUS | Status: AC
  Administered 2020-05-27 (×4): 10 meq via INTRAVENOUS
  Filled 2020-05-27 (×4): qty 100

## 2020-05-27 MED ORDER — LACTATED RINGERS IV BOLUS
1000.0000 mL | Freq: Once | INTRAVENOUS | Status: AC
  Administered 2020-05-27: 1000 mL via INTRAVENOUS

## 2020-05-27 MED ORDER — MIDAZOLAM HCL 2 MG/2ML IJ SOLN
INTRAMUSCULAR | Status: AC
  Filled 2020-05-27: qty 2

## 2020-05-27 MED ORDER — FENTANYL CITRATE (PF) 100 MCG/2ML IJ SOLN
INTRAMUSCULAR | Status: DC | PRN
  Administered 2020-05-27: 50 ug via INTRAVENOUS
  Administered 2020-05-27: 100 ug via INTRAVENOUS

## 2020-05-27 MED ORDER — ALBUMIN HUMAN 5 % IV SOLN: INTRAVENOUS | Status: DC | PRN

## 2020-05-27 MED ORDER — BUPIVACAINE-EPINEPHRINE (PF) 0.25% -1:200000 IJ SOLN
INTRAMUSCULAR | Status: DC | PRN
  Administered 2020-05-27: 30 mL via PERINEURAL

## 2020-05-27 MED ORDER — LIDOCAINE HCL (PF) 2 % IJ SOLN
INTRAMUSCULAR | Status: AC
  Filled 2020-05-27: qty 5

## 2020-05-27 MED ORDER — LEVOFLOXACIN IN D5W 500 MG/100ML IV SOLN
500.0000 mg | INTRAVENOUS | Status: DC
  Administered 2020-05-27 – 2020-05-28 (×2): 500 mg via INTRAVENOUS
  Filled 2020-05-27 (×2): qty 100

## 2020-05-27 MED ORDER — KETAMINE HCL 10 MG/ML IJ SOLN
35.0000 mg | Freq: Once | INTRAMUSCULAR | Status: DC
Start: 2020-05-27 — End: 2020-05-27

## 2020-05-27 MED ORDER — NOREPINEPHRINE 4 MG/250ML-% IV SOLN
0.0000 ug/min | INTRAVENOUS | Status: DC
  Filled 2020-05-27 (×2): qty 250

## 2020-05-27 MED ORDER — SUCCINYLCHOLINE CHLORIDE 200 MG/10ML IV SOSY
PREFILLED_SYRINGE | INTRAVENOUS | Status: AC
  Filled 2020-05-27: qty 10

## 2020-05-27 MED ORDER — MIDAZOLAM HCL 2 MG/2ML IJ SOLN
INTRAMUSCULAR | Status: DC | PRN
  Administered 2020-05-27: 2 mg via INTRAVENOUS

## 2020-05-27 MED ORDER — LACTATED RINGERS IV SOLN: INTRAVENOUS | Status: DC

## 2020-05-27 MED ORDER — SUCCINYLCHOLINE CHLORIDE 20 MG/ML IJ SOLN
INTRAMUSCULAR | Status: DC | PRN
  Administered 2020-05-27: 100 mg via INTRAVENOUS

## 2020-05-27 MED ORDER — BUPIVACAINE-EPINEPHRINE (PF) 0.25% -1:200000 IJ SOLN
INTRAMUSCULAR | Status: AC
  Filled 2020-05-27: qty 30

## 2020-05-27 MED ORDER — METOPROLOL TARTRATE 5 MG/5ML IV SOLN: 2.5000 mg | Freq: Four times a day (QID) | INTRAVENOUS | Status: DC | PRN

## 2020-05-27 MED ORDER — DILTIAZEM HCL-DEXTROSE 125-5 MG/125ML-% IV SOLN (PREMIX)
5.0000 mg/h | INTRAVENOUS | Status: DC
  Administered 2020-05-27: 5 mg/h via INTRAVENOUS

## 2020-05-27 MED ORDER — PHENYLEPHRINE HCL-NACL 10-0.9 MG/250ML-% IV SOLN
25.0000 ug/min | INTRAVENOUS | Status: DC
  Administered 2020-05-27: 35 ug/min via INTRAVENOUS
  Administered 2020-05-27: 55 ug/min via INTRAVENOUS
  Administered 2020-05-27: 25 ug/min via INTRAVENOUS
  Filled 2020-05-27 (×3): qty 250

## 2020-05-27 MED ORDER — METRONIDAZOLE IN NACL 5-0.79 MG/ML-% IV SOLN
500.0000 mg | Freq: Three times a day (TID) | INTRAVENOUS | Status: DC
  Administered 2020-05-27 – 2020-05-28 (×3): 500 mg via INTRAVENOUS
  Filled 2020-05-27 (×5): qty 100

## 2020-05-27 MED ORDER — KETAMINE HCL 50 MG/ML IJ SOLN
INTRAMUSCULAR | Status: DC | PRN
  Administered 2020-05-27: 25 mg via INTRAMUSCULAR

## 2020-05-27 MED ORDER — KETAMINE HCL 50 MG/ML IJ SOLN
INTRAMUSCULAR | Status: AC
  Filled 2020-05-27: qty 10

## 2020-05-27 MED ORDER — LIDOCAINE HCL (CARDIAC) PF 100 MG/5ML IV SOSY
PREFILLED_SYRINGE | INTRAVENOUS | Status: DC | PRN
  Administered 2020-05-27: 80 mg via INTRAVENOUS

## 2020-05-27 MED ORDER — ONDANSETRON HCL 4 MG/2ML IJ SOLN: 4.0000 mg | Freq: Four times a day (QID) | INTRAMUSCULAR | Status: DC | PRN

## 2020-05-27 MED ORDER — ONDANSETRON HCL 4 MG/2ML IJ SOLN
INTRAMUSCULAR | Status: AC
  Filled 2020-05-27: qty 2

## 2020-05-27 MED ORDER — PANTOPRAZOLE SODIUM 40 MG IV SOLR
40.0000 mg | Freq: Every day | INTRAVENOUS | Status: DC
  Administered 2020-05-27 – 2020-06-04 (×9): 40 mg via INTRAVENOUS
  Filled 2020-05-27 (×9): qty 40

## 2020-05-27 MED ORDER — MIDAZOLAM HCL 5 MG/5ML IJ SOLN
4.0000 mg | Freq: Once | INTRAMUSCULAR | Status: DC
  Filled 2020-05-27: qty 5

## 2020-05-27 MED ORDER — METOPROLOL TARTRATE 5 MG/5ML IV SOLN
2.5000 mg | Freq: Four times a day (QID) | INTRAVENOUS | Status: DC | PRN
  Administered 2020-05-27: 2.5 mg via INTRAVENOUS
  Filled 2020-05-27: qty 5

## 2020-05-27 MED ORDER — PROPOFOL 10 MG/ML IV BOLUS
INTRAVENOUS | Status: AC
  Filled 2020-05-27: qty 20

## 2020-05-27 MED ORDER — CHLORHEXIDINE GLUCONATE CLOTH 2 % EX PADS
6.0000 | MEDICATED_PAD | Freq: Every day | CUTANEOUS | Status: DC
  Administered 2020-05-27 – 2020-06-15 (×19): 6 via TOPICAL

## 2020-05-27 MED ORDER — ONDANSETRON HCL 4 MG PO TABS: 4.0000 mg | ORAL_TABLET | Freq: Four times a day (QID) | ORAL | Status: DC | PRN

## 2020-05-27 SURGICAL SUPPLY — 37 items
ADH SKN CLS APL DERMABOND .7 (GAUZE/BANDAGES/DRESSINGS) ×2
APL PRP STRL LF DISP 70% ISPRP (MISCELLANEOUS) ×2
BLADE CLIPPER SURG (BLADE) ×3 IMPLANT
CANISTER SUCT 1200ML W/VALVE (MISCELLANEOUS) ×3 IMPLANT
CHLORAPREP W/TINT 26 (MISCELLANEOUS) ×3 IMPLANT
COVER WAND RF STERILE (DRAPES) ×3 IMPLANT
DERMABOND ADVANCED (GAUZE/BANDAGES/DRESSINGS) ×1
DERMABOND ADVANCED .7 DNX12 (GAUZE/BANDAGES/DRESSINGS) ×2 IMPLANT
DRAIN PENROSE 5/8X18 LTX STRL (WOUND CARE) ×3 IMPLANT
DRAPE C-SECTION (MISCELLANEOUS) ×1 IMPLANT
DRAPE INCISE IOBAN 66X45 STRL (DRAPES) ×2 IMPLANT
DRAPE LAPAROTOMY 77X122 PED (DRAPES) ×3 IMPLANT
DRSG OPSITE POSTOP 4X8 (GAUZE/BANDAGES/DRESSINGS) ×2 IMPLANT
ELECT REM PT RETURN 9FT ADLT (ELECTROSURGICAL) ×3
ELECTRODE REM PT RTRN 9FT ADLT (ELECTROSURGICAL) ×2 IMPLANT
GLOVE BIO SURGEON STRL SZ7 (GLOVE) ×3 IMPLANT
GOWN STRL REUS W/ TWL LRG LVL3 (GOWN DISPOSABLE) ×4 IMPLANT
GOWN STRL REUS W/TWL LRG LVL3 (GOWN DISPOSABLE) ×6
MANIFOLD NEPTUNE II (INSTRUMENTS) ×3 IMPLANT
NEEDLE HYPO 22GX1.5 SAFETY (NEEDLE) ×3 IMPLANT
NS IRRIG 1000ML POUR BTL (IV SOLUTION) ×3 IMPLANT
PACK BASIN MINOR (MISCELLANEOUS) ×3 IMPLANT
RELOAD LINEAR CUT PROX 55 BLUE (ENDOMECHANICALS) ×3 IMPLANT
RELOAD STAPLE 55 3.8 BLU REG (ENDOMECHANICALS) IMPLANT
SPONGE KITTNER 5P (MISCELLANEOUS) ×3 IMPLANT
SPONGE LAP 18X18 RF (DISPOSABLE) ×3 IMPLANT
STAPLER PROXIMATE 55 BLUE (STAPLE) ×1 IMPLANT
STAPLER SKIN PROX 35W (STAPLE) ×1 IMPLANT
SUT ETHIBOND NAB MO 7 #0 18IN (SUTURE) ×6 IMPLANT
SUT MNCRL AB 4-0 PS2 18 (SUTURE) ×3 IMPLANT
SUT PDS AB 0 CT1 27 (SUTURE) ×2 IMPLANT
SUT SILK 3-0 (SUTURE) ×1 IMPLANT
SUT VIC AB 3-0 54X BRD REEL (SUTURE) ×2 IMPLANT
SUT VIC AB 3-0 BRD 54 (SUTURE) ×3
SUT VIC AB 3-0 SH 27 (SUTURE) ×3
SUT VIC AB 3-0 SH 27X BRD (SUTURE) ×2 IMPLANT
SYR 20ML LL LF (SYRINGE) ×3 IMPLANT

## 2020-05-27 NOTE — Anesthesia Preprocedure Evaluation (Signed)
Anesthesia Evaluation  Patient identified by MRN, date of birth, ID band Patient confused    Reviewed: Allergy & Precautions, H&P , NPO status , Patient's Chart, lab work & pertinent test results  History of Anesthesia Complications Negative for: history of anesthetic complications  Airway Mallampati: III  TM Distance: >3 FB Neck ROM: limited    Dental  (+) Chipped, Poor Dentition, Missing   Pulmonary former smoker,    Pulmonary exam normal        Cardiovascular hypertension, + CAD and + Past MI  Normal cardiovascular exam     Neuro/Psych PSYCHIATRIC DISORDERS Dementia TIA   GI/Hepatic negative GI ROS, Neg liver ROS,   Endo/Other  negative endocrine ROS  Renal/GU      Musculoskeletal   Abdominal   Peds  Hematology negative hematology ROS (+)   Anesthesia Other Findings Incarcerated Hernia  Past Medical History: No date: Atrial flutter (HCC) No date: Coronary artery disease No date: Hyperlipidemia No date: Hypertension No date: TIA (transient ischemic attack)  Past Surgical History: No date: arm surgery; Left No date: HERNIA REPAIR     Reproductive/Obstetrics negative OB ROS                             Anesthesia Physical Anesthesia Plan  ASA: IV and emergent  Anesthesia Plan: General ETT, Rapid Sequence and Cricoid Pressure   Post-op Pain Management:    Induction: Intravenous  PONV Risk Score and Plan: Ondansetron, Dexamethasone, Midazolam and Treatment may vary due to age or medical condition  Airway Management Planned: Oral ETT  Additional Equipment:   Intra-op Plan:   Post-operative Plan: Extubation in OR and Possible Post-op intubation/ventilation  Informed Consent: I have reviewed the patients History and Physical, chart, labs and discussed the procedure including the risks, benefits and alternatives for the proposed anesthesia with the patient or authorized  representative who has indicated his/her understanding and acceptance.     Dental Advisory Given  Plan Discussed with: Anesthesiologist, CRNA and Surgeon  Anesthesia Plan Comments: (History and phone consent from the patients daughter Tyrell Seifer at  [phone]   Daughter consented for risks of anesthesia including but not limited to:  - adverse reactions to medications - damage to eyes, teeth, lips or other oral mucosa - nerve damage due to positioning  - sore throat or hoarseness - Damage to heart, brain, nerves, lungs, other parts of body or loss of life  She voiced understanding.)        Anesthesia Quick Evaluation

## 2020-05-27 NOTE — Anesthesia Postprocedure Evaluation (Signed)
Anesthesia Post Note  Patient: Craig [name]  Procedure(s) Performed: HERNIA REPAIR INGUINAL INCARCERATED (Right ) SMALL BOWEL RESECTION ORCHIECTOMY (Right )  Anesthesia Type: General Level of consciousness: patient remains intubated per anesthesia plan Pain management: pain level controlled Vital Signs Assessment: post-procedure vital signs reviewed and stable Respiratory status: patient on ventilator - see flowsheet for VS Cardiovascular status: stable Anesthetic complications: no   No complications documented.   Last Vitals:  Vitals:   05/27/20 0600 05/27/20 0630  BP: 109/77 93/65  Pulse: (!) 124 (!) 125  Resp: 20 15  Temp:    SpO2: 100% 100%    Last Pain:  Vitals:   05/27/20 0508  TempSrc: Oral  PainSc: 0-No pain                 Lanessa Shill,  Novak Stgermaine R

## 2020-05-27 NOTE — Consult Note (Signed)
NAME:  Craig [name], MRN:  [id number], DOB:  [birth year], LOS: 0 ADMISSION DATE:  05/26/2020, CONSULTATION DATE: 05/27/2020 REFERRING MD: Dr. Christian Mate, CHIEF COMPLAINT: Abdominal Pain   Brief History   79 yo male admitted with incarcerated right inguinal hernia with strangulated knuckle of small bowel, small bowel obstruction and right spermatic cord abscess s/p right inguinal hernia repair, small bowel resection, and right orchiectomy remained mechanically intubated postop  History of present illness   This is a 79 yo male who presented to [facility] ER on 12/8 via EMS with c/o abdominal pain.  Per ER notes pts daughter reported to EMS the pt had not been "acting like himself" over the last week.  She also reported he has had poor po intake 2 days prior to ER presentation.  In the ER lab results revealed Na+ 149, K+ 3.2, BUN 42, BNP 256.9, troponin 54, and lactic acid 3.0.  COVID-19/Influenza PCR and CXR negative. EKG showed atrial flutter with 2-1 conduction, heart rate 139.  Therefore, he received 10 mg iv cardizem and cardizem gtt initiated.  CT Abd Pelvis revealed high-grade small bowel obstruction with a transition point at the entrance of a right inguinal hernia with possible incarceration but no bowel perforation. He received levaquin, flagyl, and 2L of fluid resuscitation.  Surgical team consulted and pt transported to OR for emergent right inguinal hernia repair, small bowel resection, and right orchiectomy.  He remained intubated postop requiring transfer to ICU and PCCM consulted for vent management.    Past Medical History  TIA HTN HLD CAD Atrial Flutter  Dementia  Significant Hospital Events   12/8: Pt admitted with incarcerated right inguinal hernia with strangulated knuckle of small bowel, small bowel obstruction and right spermatic cord abscess s/p right inguinal hernia repair, small bowel resection, and right orchiectomy  and remained mechanically intubated postop requiring ICU  admission PCCM team consulted for vent management   Consults:  Intensivist  General Surgery  Procedures:  Mechanical Ventilation  Right Inguinal Hernia Repair   Significant Diagnostic Tests:  CT Abd Pelvis 12/8>>High-grade small bowel obstruction with a transition point at the entrance of a right inguinal hernia. No bowel perforation. Recommend surgical consultation. Additional closed-loop obstruction of a short loop of small bowel contained within the right inguinal hernia suggesting possible incarceration. Recommend surgical consultation. Fluid within the esophageal lumen with a distended gastric lumen. Recommend enteric tube placement. Indeterminate L2 lytic lesion with associated pathologic superior endplate fracture. Indeterminate 3.2 cm ill-defined hypodense lesion within the right hepatic lobe. Consider MRI liver protocol for further evaluation. Prostatomegaly with associated radioactive seeds. Other imaging findings of potential clinical significance: Multiple fat density lesions within superficial anterior abdominal subcutaneus soft tissues that likely represent lipomatous lesions. Aortic Atherosclerosis (ICD10-I70.0).  Micro Data:  Blood 12/8>> Urine 12/8>> Influenza PCR/COVID-19 12/9>>negative   Antimicrobials:  Levaquin 12/8 x1 dose  Metronidazole 12/8>>  Interim history/subjective:  Pt sedated and mechanically intubated postop.   Objective   Blood pressure (!) 101/56, pulse (!) 139, temperature 98 F (36.7 C), temperature source Oral, resp. rate 14, SpO2 100 %.        Intake/Output Summary (Last 24 hours) at 05/27/2020 0426 Last data filed at 05/27/2020 0425 Gross per 24 hour  Intake 3500 ml  Output --  Net 3500 ml   There were no vitals filed for this visit.  Examination: General: acutely ill appearing male, NAD mechanically intubated  HENT: supple, no JVD  Lungs: diminished throughout, even, non labored  Cardiovascular:  atrial flutter, no R/G, 2+ palpable  radial and distal pulses  Abdomen: hypoactive BS x4, soft, non distended, right groin honeycomb dressing clean/dry/intact incision site well approximated, midline abdominal incision well approximated, honeycomb dressing clean/dry/intact   Extremities: normal tone, no edema  Neuro: sedated, not following commands, bilateral pupils pinpoint sluggish  GU: indwelling foley catheter draining dark yellow urine   Assessment & Plan:  Intra op mechanical intubation  Full vent support for now-vent settings reviewed and established  SBT once all parameters met  VAP bundle implemented Prn bronchodilator therapy  Elevated troponin likely demand ischemia secondary to small bowel obstruction  Atrial flutter  Hx: HTN, HLD, CAD, and TIA Continuous telemetry monitoring  Trend troponin   Aggressive fluid resuscitation to maintain map >65 Prn metoprolol for rate control  Discontinue cardizem gtt due to hypotension  Hold outpatient lisinopril   Small bowel obstruction secondary to incarcerated right inguinal hernia and right spermatic cord abscess s/p right inguinal hernia repair, small bowel resection, and right orchiectomy-05/27/20 Trend WBC and monitor fever  Trend PCT and lactic acid  Follow cultures  Will continue metronidazole  General surgery consulted appreciate input-will remain NPO for now and defer to surgical team   Mechanical intubation pain/discomfort  Hx: Dementia Maintain RASS goal -1 to -2 Fentanyl gtt and prn versed for pain management and to maintain RASS goal  WUA daily   Best practice (evaluated daily)   Diet: NPO Pain/Anxiety/Delirium protocol (if indicated): Fentanyl gtt and prn versed  VAP protocol (if indicated): Ordered  DVT prophylaxis: SCD's GI prophylaxis: IV protonix  Glucose control: CBG's q4hrs  Mobility: Bedrest  last date of multidisciplinary goals of care discussion N/A Family and staff present No family present at bedside  Summary of discussion N/A Follow  up goals of care discussion due 05/27/2020 Code Status: Full Code  Disposition: ICU   Labs   CBC: Recent Labs  Lab 05/26/20 2024  WBC 9.4  NEUTROABS 8.1*  HGB 14.6  HCT 47.5  MCV 100.6*  PLT 830    Basic Metabolic Panel: Recent Labs  Lab 05/26/20 2024  NA 149*  K 3.2*  CL 105  CO2 31  GLUCOSE 123*  BUN 42*  CREATININE 0.89  CALCIUM 10.0   GFR: Estimated Creatinine Clearance: 73.5 mL/min (by C-G formula based on SCr of 0.89 mg/dL). Recent Labs  Lab 05/26/20 2024 05/26/20 2230  WBC 9.4  --   LATICACIDVEN 3.0* 1.8    Liver Function Tests: Recent Labs  Lab 05/26/20 2024  AST 33  ALT 24  ALKPHOS 53  BILITOT 1.3*  PROT 9.2*  ALBUMIN 3.0*   Recent Labs  Lab 05/26/20 2024  LIPASE 42   No results for input(s): AMMONIA in the last 168 hours.  ABG    Component Value Date/Time   HCO3 31.4 (H) 05/26/2020 2024   O2SAT 65.9 05/26/2020 2024     Coagulation Profile: Recent Labs  Lab 05/26/20 2230  INR 1.3*    Cardiac Enzymes: No results for input(s): CKTOTAL, CKMB, CKMBINDEX, TROPONINI in the last 168 hours.  HbA1C: No results found for: HGBA1C  CBG: No results for input(s): GLUCAP in the last 168 hours.  Review of Systems:   Unable to assess pt mechanically intubated  Past Medical History  He,  has a past medical history of Atrial flutter ([place]), Coronary artery disease, Hyperlipidemia, Hypertension, and TIA (transient ischemic attack).   Surgical History    Past Surgical History:  Procedure Laterality Date  . arm  surgery Left   . HERNIA REPAIR       Social History   reports that he has quit smoking. He smoked 0.00 packs per day. His smokeless tobacco use includes snuff. He reports current alcohol use. He reports that he does not use drugs.   Family History   His family history is not on file.   Allergies Allergies  Allergen Reactions  . Penicillins      Home Medications  Prior to Admission medications   Medication Sig Start  Date End Date Taking? Authorizing Provider  aspirin EC 81 MG tablet Take 81 mg by mouth daily. Swallow whole.   Yes [provider]  lisinopril (ZESTRIL) 20 MG tablet Take 1 tablet (20 mg total) by mouth daily. 04/26/20  Yes Lorella Nimrod, MD  Multiple Vitamin (MULTIVITAMIN WITH MINERALS) TABS tablet Take 1 tablet by mouth daily. 04/27/20  Yes Lorella Nimrod, MD  donepezil (ARICEPT) 10 MG tablet TK 1 T PO HS FOR MEMORY Patient not taking: No sig reported 12/18/17   [provider]  feeding supplement (ENSURE ENLIVE / ENSURE PLUS) LIQD Take 237 mLs by mouth 3 (three) times daily between meals. 04/26/20   Lorella Nimrod, MD  oxybutynin (DITROPAN) 5 MG tablet Take 5 mg by mouth at bedtime. Patient not taking: Reported on 05/26/2020 02/05/20   [provider]     Critical care time: 40 minutes     Marda Stalker, [place] Pager [phone] (please enter 7 digits) PCCM Consult Pager [phone] (please enter 7 digits)

## 2020-05-27 NOTE — ED Notes (Signed)
Pressure to low to continue with sedation. Per Dr. Cinda Quest, start pressors and reassess.  Hold dilt until stable BP

## 2020-05-27 NOTE — Progress Notes (Signed)
Left msg on Jamie Hable's mobile re: surgery and current disposition.

## 2020-05-27 NOTE — ED Notes (Signed)
Pt has received morphine and dilaudid and is unable to give direct consent. Pts daughter (primary caregiver) contacted and updated on patients status. She gave verbal consent for emergency procedure as explained by surgeon. Consent witnessed by this nurse and Miah RN.

## 2020-05-27 NOTE — Transfer of Care (Signed)
Immediate Anesthesia Transfer of Care Note  Patient: Craig [name]  Procedure(s) Performed: HERNIA REPAIR INGUINAL INCARCERATED (Right ) SMALL BOWEL RESECTION ORCHIECTOMY (Right )  Patient Location: ICU  Anesthesia Type:General  Level of Consciousness: sedated  Airway & Oxygen Therapy: Patient remains intubated per anesthesia plan and Patient placed on Ventilator (see vital sign flow sheet for setting)  Post-op Assessment: Report given to RN and Post -op Vital signs reviewed and stable  Post vital signs: Reviewed and stable  Last Vitals:  Vitals Value Taken Time  BP 107/68 05/27/20 0508  Temp    Pulse 123 05/27/20 0508  Resp 15 05/27/20 0508  SpO2 100 % 05/27/20 0508    Last Pain:  Vitals:   05/26/20 1950  TempSrc: Oral         Complications: No complications documented.

## 2020-05-27 NOTE — Anesthesia Procedure Notes (Signed)
Procedure Name: Intubation Date/Time: 05/27/2020 2:41 AM Performed by: Hedda Slade, CRNA Pre-anesthesia Checklist: Patient identified, Patient being monitored, Timeout performed, Emergency Drugs available and Suction available Patient Re-evaluated:Patient Re-evaluated prior to induction Oxygen Delivery Method: Circle system utilized Preoxygenation: Pre-oxygenation with 100% oxygen Induction Type: IV induction, Cricoid Pressure applied and Rapid sequence Laryngoscope Size: McGraph and 4 Grade View: Grade I Tube type: Oral Tube size: 7.5 mm Number of attempts: 1 Airway Equipment and Method: Stylet and Video-laryngoscopy Placement Confirmation: ETT inserted through vocal cords under direct vision,  positive ETCO2 and breath sounds checked- equal and bilateral Secured at: 22 cm Tube secured with: Tape Dental Injury: Teeth and Oropharynx as per pre-operative assessment

## 2020-05-27 NOTE — H&P (Signed)
Patient ID: Craig [name], male   DOB: 10/19/1940, 79 y.o.   MRN: [id number]  Chief Complaint: Abdominal distention/abdominal pain diffuse.  History of Present Illness Julis Haubner is a 80 y.o. male with 2 to 3-day history of progressive abdominal pain distention.  Denies vomiting.  Historian is been his daughter who reports that last couple days has not been eating or drinking much.  Due to his degree of dementia it is difficult to further qualify his abdominal pain.  Past Medical History Past Medical History:  Diagnosis Date  . Atrial flutter ([place])   . Coronary artery disease   . Hyperlipidemia   . Hypertension   . TIA (transient ischemic attack)       Past Surgical History:  Procedure Laterality Date  . arm surgery Left   . HERNIA REPAIR      Allergies  Allergen Reactions  . Penicillins     Current Facility-Administered Medications  Medication Dose Route Frequency Provider Last Rate Last Admin  . acetaminophen (TYLENOL) tablet 650 mg  650 mg Oral Q6H PRN Athena Masse, MD       Or  . acetaminophen (TYLENOL) suppository 650 mg  650 mg Rectal Q6H PRN Athena Masse, MD      . diltiazem (CARDIZEM) 125 mg in dextrose 5% 125 mL (1 mg/mL) infusion  5-15 mg/hr Intravenous Continuous Athena Masse, MD 5 mL/hr at 05/27/20 0033 5 mg/hr at 05/27/20 0033  . lactated ringers bolus 1,000 mL  1,000 mL Intravenous Once Nena Polio, MD      . lactated ringers infusion   Intravenous Continuous Athena Masse, MD      . levofloxacin (LEVAQUIN) IVPB 500 mg  500 mg Intravenous Once Nena Polio, MD      . LORazepam (ATIVAN) injection 2 mg  2 mg Intravenous Once Nena Polio, MD      . naloxone [facility]) 2 MG/2ML injection           . norepinephrine (LEVOPHED) 4mg  in 255mL premix infusion  0-40 mcg/min Intravenous Continuous Nena Polio, MD      . ondansetron [facility]) tablet 4 mg  4 mg Oral Q6H PRN Athena Masse, MD       Or  . ondansetron [facility]) injection 4  mg  4 mg Intravenous Q6H PRN Athena Masse, MD       Current Outpatient Medications  Medication Sig Dispense Refill  . aspirin EC 81 MG tablet Take 81 mg by mouth daily. Swallow whole.    . lisinopril (ZESTRIL) 20 MG tablet Take 1 tablet (20 mg total) by mouth daily. 90 tablet 0  . Multiple Vitamin (MULTIVITAMIN WITH MINERALS) TABS tablet Take 1 tablet by mouth daily. 90 tablet 0  . donepezil (ARICEPT) 10 MG tablet TK 1 T PO HS FOR MEMORY (Patient not taking: No sig reported)  5  . feeding supplement (ENSURE ENLIVE / ENSURE PLUS) LIQD Take 237 mLs by mouth 3 (three) times daily between meals. 237 mL 12  . oxybutynin (DITROPAN) 5 MG tablet Take 5 mg by mouth at bedtime. (Patient not taking: Reported on 05/26/2020)      Family History History reviewed. No pertinent family history.    Social History Social History   Tobacco Use  . Smoking status: Former Smoker    Packs/day: 0.00  . Smokeless tobacco: Current User    Types: Snuff  Substance Use Topics  . Alcohol use: Yes  . Drug use:  No        Review of Systems  Unable to perform ROS: Dementia      Physical Exam Blood pressure 110/72, pulse (!) 53, temperature 98 F (36.7 C), temperature source Oral, resp. rate (!) 9, SpO2 100 %.   CONSTITUTIONAL: Well developed, and under nourished, appropriately responsive and aware with distress.   EYES: Sclera non-icteric.   EARS, NOSE, MOUTH AND THROAT: Mask worn.     Hearing is intact to voice.  NECK: Trachea is midline, and there is no jugular venous distension.  LYMPH NODES:  Lymph nodes in the neck are not enlarged. RESPIRATORY:  Lungs are clear, and breath sounds are equal bilaterally. Normal respiratory effort without pathologic use of accessory muscles. CARDIOVASCULAR: Heart is tachycardic, with monitor her rhythms consistent with atrial flutter.  Current heart rate 130s.  GI: The abdomen is soft, nontender, but distended. There were no palpable masses. I did not appreciate  hepatosplenomegaly.  GU: Right groin mass consistent with incarcerated hernia, hard and firm to palpation, minimally tender.  Manipulations by both Dr. Rip Harbour myself with adequate sedation have not not produced any potential improvement toward reduction. MUSCULOSKELETAL:  Symmetrical muscle tone apparent in all four extremities.    SKIN: Skin turgor is normal. No pathologic skin lesions appreciated.  Multiple lipomatous lesions in the subcutaneous tissue across abdominal wall upper extremities etc. NEUROLOGIC:  Motor and sensation appear grossly normal.  Cranial nerves are grossly without defect. PSYCH: Responsive to questions, difficult to understand.  Data Reviewed I have personally reviewed what is currently available of the patient's imaging, recent labs and medical records.   Labs:  CBC Latest Ref Rng & Units 05/26/2020 05/18/2020 04/26/2020  WBC 4.0 - 10.5 K/uL 9.4 6.5 3.1(L)  Hemoglobin 13.0 - 17.0 g/dL 14.6 13.7 12.1(L)  Hematocrit 39 - 52 % 47.5 41.8 36.4(L)  Platelets 150 - 400 K/uL 269 235 167   CMP Latest Ref Rng & Units 05/26/2020 05/18/2020 04/26/2020  Glucose 70 - 99 mg/dL 123(H) 126(H) 82  BUN 8 - 23 mg/dL 42(H) 24(H) 27(H)  Creatinine 0.61 - 1.24 mg/dL 0.89 1.05 1.02  Sodium 135 - 145 mmol/L 149(H) 138 129(L)  Potassium 3.5 - 5.1 mmol/L 3.2(L) 4.0 4.2  Chloride 98 - 111 mmol/L 105 103 95(L)  CO2 22 - 32 mmol/L 31 24 28   Calcium 8.9 - 10.3 mg/dL 10.0 9.0 8.9  Total Protein 6.5 - 8.1 g/dL 9.2(H) 8.8(H) -  Total Bilirubin 0.3 - 1.2 mg/dL 1.3(H) 1.1 -  Alkaline Phos 38 - 126 U/L 53 61 -  AST 15 - 41 U/L 33 28 -  ALT 0 - 44 U/L 24 22 -   Venous lactate 3.0   Imaging:  Within last 24 hrs: DG Chest 2 View  Result Date: 05/26/2020 CLINICAL DATA:  We, altered mental status. Abdominal pain. Decreased appetite and mobility. EXAM: CHEST - 2 VIEW COMPARISON:  Chest x-ray 04/20/2020 FINDINGS: The heart size and mediastinal contours are unchanged. Aortic arch calcification. No  focal consolidation. No pulmonary edema. No pleural effusion. No pneumothorax. No acute osseous abnormality. IMPRESSION: No active cardiopulmonary disease. Electronically Signed   By: Iven Finn M.D.   On: 05/26/2020 20:09   CT ABDOMEN PELVIS W CONTRAST  Result Date: 05/26/2020 CLINICAL DATA:  Abdominal distension, abdominal pain. Decreased appetite and mobility EXAM: CT ABDOMEN AND PELVIS WITH CONTRAST TECHNIQUE: Multidetector CT imaging of the abdomen and pelvis was performed using the standard protocol following bolus administration of intravenous contrast. CONTRAST:  135mL OMNIPAQUE IOHEXOL 300 MG/ML  SOLN COMPARISON:  CT abdomen pelvis 10/31/2017. FINDINGS: Lower chest: Elevated left hemidiaphragm with compressive changes at the left base. Fluid noted within the esophageal lumen. Coronary artery calcifications. Hepatobiliary: Redemonstration of an ill-defined hypodense right hepatic lesion measuring 3.2 x 2.6 cm (from 3.9 x 3.3 cm). Persistent calcific density along the peripheral right hepatic dome the could represent sequela of prior granulomatous disease. No gallstones, gallbladder wall thickening, or pericholecystic fluid. No biliary dilatation. Pancreas: No focal lesion. Normal pancreatic contour. No surrounding inflammatory changes. No main pancreatic ductal dilatation. Spleen: Normal in size without focal abnormality. Adrenals/Urinary Tract: No adrenal nodule bilaterally. Bilateral kidneys enhance symmetrically. No hydronephrosis. No hydroureter. On delayed imaging, there is no urothelial wall thickening and there are no filling defects in the opacified portions of the bilateral collecting systems or ureters. The urinary bladder is unremarkable. Stomach/Bowel: The gastric lumen is dilated with fluid. Markedly proximal and mid dilated small bowel loops with a transition point at the entrance of the right inguinal hernia (11:84, 5:40). The short segment of small bowel within the right inguinal  hernia also is noted to be distended with an air-fluid level within other likely transition point at the exit of the small bowel. The small bowel distally is completely collapsed. Stool within the colon. No small or large bowel wall thickening. No large bowel dilatation. No definite pneumatosis. Vascular/Lymphatic: No abdominal aorta or iliac aneurysm. Severe atherosclerotic plaque of the aorta and its branches. No abdominal, pelvic, or inguinal lymphadenopathy. Reproductive: Multiple round metallic densities within and surrounding the prostate gland consistent with radioactive seeds. Prostate is enlarged measuring up to at least 5 cm. Other: No intraperitoneal free fluid. No intraperitoneal free gas. No organized fluid collection. Musculoskeletal: Severe soft tissue edema. Redemonstration of multiple fat density lesion within superficial anterior abdominal subcutaneus soft tissues that likely represent lipomatous lesions. The largest measures 1.5 x 0.9 cm (11:23). Redemonstration (new from 10/31/2017, retrospectively visualized on 04/20/2020) of a lytic lesion within the L2 vertebral body with an associated pathologic fracture of the superior endplate (5: 68, 43:15). Otherwise no new acute displaced fracture. Old healed left rib fractures. Multilevel degenerative changes of the spine. IMPRESSION: 1. High-grade small bowel obstruction with a transition point at the entrance of a right inguinal hernia. No bowel perforation. Recommend surgical consultation. 2. Additional closed-loop obstruction of a short loop of small bowel contained within the right inguinal hernia suggesting possible incarceration. Recommend surgical consultation. 3. Fluid within the esophageal lumen with a distended gastric lumen. Recommend enteric tube placement. 4. Indeterminate L2 lytic lesion with associated pathologic superior endplate fracture. 5. Indeterminate 3.2 cm ill-defined hypodense lesion within the right hepatic lobe. Consider MRI  liver protocol for further evaluation. 6. Prostatomegaly with associated radioactive seeds. 7. Other imaging findings of potential clinical significance: Multiple fat density lesions within superficial anterior abdominal subcutaneus soft tissues that likely represent lipomatous lesions. Aortic Atherosclerosis (ICD10-I70.0). These results were called by telephone at the time of interpretation on 05/26/2020 at 10:30 pm to provider [facility] , who verbally acknowledged these results. Electronically Signed   By: Iven Finn M.D.   On: 05/26/2020 22:45    Assessment    Incarcerated right inguinal hernia resulting in small bowel obstruction and possible closed-loop within the hernia itself. Patient Active Problem List   Diagnosis Date Noted  . Atrial flutter with rapid ventricular response ([place]) 05/26/2020  . SBO (small bowel obstruction) ([place]) 05/26/2020  . Hypernatremia 05/26/2020  . Nicotine dependence  05/26/2020  . Protein-calorie malnutrition, severe 04/22/2020  . Dementia with behavioral disturbance ([place]) 04/21/2020  . Altered mental status 04/20/2020  . Atrial flutter ([place])   . Bradycardia   . CAD (coronary artery disease), native coronary artery 12/25/2017  . History of coronary artery stent placement 12/25/2017  . Hyperlipidemia 12/25/2017  . Former smoker 12/25/2017  . Essential hypertension 12/25/2017  . Memory loss 12/25/2017    Plan    Emergent right inguinal hernia repair, possible laparotomy with bowel resection pending condition of the intestine. Nasogastric decompression once intubated, patient is currently suspected to be sufficiently uncooperative as to maintain NG tube if attempts made in the ED. Risks and benefits of the proposed procedure discussed with the daughter emergency consent obtained over the phone.  She is well aware that there are risks of anesthesia, infection, bleeding, cerebrovascular insult, myocardial infarction, sepsis or death.  There were no  guarantees ever expressed or implied.    Ronny Bacon M.D., FACS 05/27/2020, 12:48 AM

## 2020-05-27 NOTE — Op Note (Addendum)
Right inguinal hernia repair, small bowel resection, right orchiectomy.  Pre-operative Diagnosis: Incarcerated right inguinal hernia with small bowel obstruction.   Post-operative Diagnosis: Incarcerated right inguinal hernia with strangulated (gangrenous) knuckle of small bowel with perforation, small bowel obstruction and right spermatic cord abscess.  Surgeon: Ronny Bacon, M.D., [facility]  Anesthesia: General endotracheal  Findings: Short segment of small bowel necrosis (gangrene with perforation) within right spermatic cord, with resulting abscess.  Upon entering the right groin and upon dissecting out the spermatic cord, I encountered an abscess in the cord, along with gangrenous small bowel with perforation.  This had to be controlled and reduced into the abdomen in order to complete the necessary resection.   Estimated Blood Loss: 25 mL         Specimens: Knuckle of small bowel (very short segment), right spermatic cord with testicle.       Complications: none              Procedure Details  The patient's daughter concurred with the proposed plan for emergency surgery, giving informed approval for the emergency procedure.  The patient was taken to Operating Room, identified and the procedure verified.    Prior to the induction of general anesthesia, antibiotic prophylaxis was administered. VTE prophylaxis was in place.  General anesthesia was then administered and tolerated well. After the induction, the patient was positioned in the supine position and the abdomen and bilateral groins and male genitalia were prepped with Chloraprep and draped in the sterile fashion with the [place] C-section drape.  A Time Out was held and the above information confirmed.  A right groin incision is made over the mass in the right inguinal region, it was carried down through the soft tissues sharply, utilizing electrocautery to maintain hemostasis.  I was able to dissect down to the cremasteric layers  of the spermatic cord, sharply dissected into the cord and identified drainage consistent with necrosis and frank small bowel perforation.  At that time it was clear I needed to proceed with laparotomy. Lower midline incision was made and carried down through the subcutaneous tissues with electrosurgery.  Midline fascia was incised, and the peritoneum carefully opened.  I then reached down and accessed the small bowel stuck tightly within the internal ring of the right inguinal hernia.  I utilized the Metzenbaum scissors to open the ring to allow reduction of the very limited small bowel content.  I maintain control of the necrotic area of small bowel immediately, I milked back the proximal dilated contents and applied a soft intestinal clamp. The distal aspect was clearly decompressed and this was the single isolated source of small bowel obstruction.  I utilized a 22 GIA stapler to create a common channel between the proximal dilated small bowel and the distal decompressed small bowel.  I then used another 35 GIA stapler to excise the compromised small bowel including the enterotomy/necrotic portion.  This left me a widely patent common channel and complete closure of the small intestinal defect.  No significant mesentery needed to be taken.  I then reinforced the staple line with interrupted Lembert sutures of 3-0 silk.  Dunking the dilated small bowel corner and the common crossing staple line. I then palpated the NG tube which was at the gastroesophageal junction, with the help of anesthesia we then advanced it fully into the stomach obtained better return. Then returned to the right groin where there was clearly an abscess with in the spermatic cord.  Knowing that I would  not be able to utilize a permanent mesh for repair, I felt it prudent to proceed with orchiectomy for infection control and to obtain a better right inguinal hernia repair not utilizing prosthetic material. I then mobilized the right  testicle and its cord to the internal ring. I then separated the spermatic cord into its pedicles and with 3 clamps, proceeded with division of the spermatic cord.  Suture ligatures of 2-0 Vicryl were utilized. We then fully irrigated the abdomen and right groin, and a clean closure was then initiated. I completed repair of the right inguinal hernia with full closure of the external oblique with interrupted 0 Ethibonds. We then closed the midline linea alba with running 0 PDS. Scarpa's fascia of the right groin was then closed with a running 3-0 Vicryl.  The skin of both incisions was then closed with staples.  Prior to closure infiltrated all the fascial planes with a mixture of Exparel with quarter percent Marcaine. Honeycomb dressing was applied.  Patient was then transferred to the ICU in guarded condition.      Ronny Bacon M.D., [facility] 05/27/2020 4:44 AM

## 2020-05-27 NOTE — Progress Notes (Signed)
Shift note: Pt alert and disoriented with confusion noted. Heart rate irregular 80's to greater than 130's at times this shift. Sedation was  discontinued this am and pt was extubated At 1445. Continues on neo for pressure support. F/C in place draining clear amber urine. NG tube in place, clamped. Very resistive to care, bit down on toothbrush and swabs when I attempted oral care post extubation. Honey comb dressings to abdomen in place, clean dry and intact.

## 2020-05-27 NOTE — Progress Notes (Addendum)
Initial Nutrition Assessment  DOCUMENTATION CODES:   Severe malnutrition in context of social or environmental circumstances  INTERVENTION:  Will monitor for nutrition plan of care per surgery and for diet advancement. Once diet able to be advanced recommend: -Ensure Enlive po TID, each supplement provides 350 kcal and 20 grams of protein -MVI po daily  If diet unable to be advanced will monitor for need for nutrition support.  Monitor magnesium, potassium, and phosphorus daily for at least 3 days, MD to replete as needed, as pt is at risk for refeeding syndrome given severe malnutrition.  NUTRITION DIAGNOSIS:   Severe Malnutrition related to social / environmental circumstances (dementia, suspected inadequate oral intake) as evidenced by severe fat depletion,severe muscle depletion.  GOAL:   Patient will meet greater than or equal to 90% of their needs  MONITOR:   Diet advancement,Labs,Weight trends,I & O's  REASON FOR ASSESSMENT:   Ventilator    ASSESSMENT:   79 year old male with PMHx of dementia, HTN, atrial flutter, CAD, HLD, TIA admitted with right inguinal hernia with strangulated knuckle of small bowel, small bowel obstruction, and right spermatic cord abscess s/p right inguinal hernia repair, small bowel resection, and right orchiectomy on 12/9.   12/9 patient remained intubated post-operatively and transferred to ICU  At time of assessment this morning patient was on ventilator but he has now been extubated. Will monitor for diet advancement and nutrition plan of care per surgery.  Medications reviewed and include: Protonix, fentanyl gtt, LR at 125 mL/hr, Levaquin, Flagyl, phenylephrine gtt at 25 mcg/min, potassium chloride 10 mEq IV x 4 today.  Labs reviewed: CBG 86, Sodium 146, Potassium 3.4, BUN 42.  I/O: no documented UOP  Per review of weight history in chart patient was 81.2 kg on 12/25/2017. Suspect weight of 81.2 kg on 04/20/2020 was copied forward and not  truly measured. Patient was 74.8 kg on 05/18/2020 and is currently 70.9 kg (156.31 lbs).  Discussed on rounds. Plan for SBT today.  NUTRITION - FOCUSED PHYSICAL EXAM:  Flowsheet Row Most Recent Value  Orbital Region Moderate depletion  Upper Arm Region Severe depletion  Thoracic and Lumbar Region Severe depletion  Buccal Region Unable to assess  Temple Region Severe depletion  Clavicle Bone Region Moderate depletion  Clavicle and Acromion Bone Region Severe depletion  Scapular Bone Region Unable to assess  Dorsal Hand Severe depletion  Patellar Region Severe depletion  Anterior Thigh Region Severe depletion  Posterior Calf Region Severe depletion  Edema (RD Assessment) None  Hair Reviewed  Eyes Unable to assess  Mouth Unable to assess  Skin Reviewed  Nails Reviewed     Diet Order:   Diet Order            Diet NPO time specified  Diet effective now                EDUCATION NEEDS:   No education needs have been identified at this time  Skin:  Skin Assessment: Skin Integrity Issues: Skin Integrity Issues:: Incisions Incisions: closed incisions to abdomen and scrotum  Last BM:  Unknown  Height:   Ht Readings from Last 1 Encounters:  05/18/20 6\' 2"  (1.88 m)   Weight:   Wt Readings from Last 1 Encounters:  05/27/20 70.9 kg   Ideal Body Weight:  86.4 kg  BMI:  Body mass index is 20.07 kg/m.  Estimated Nutritional Needs:   Kcal:  2000-2200  Protein:  100-110 grams  Fluid:  1.8 L/day  Craig [name]  Edison Pace, MS, RD, LDN Pager number available on Amion

## 2020-05-28 LAB — BASIC METABOLIC PANEL
Anion gap: 6 (ref 5–15)
BUN: 31 mg/dL — ABNORMAL HIGH (ref 8–23)
CO2: 28 mmol/L (ref 22–32)
Calcium: 9.4 mg/dL (ref 8.9–10.3)
Chloride: 112 mmol/L — ABNORMAL HIGH (ref 98–111)
Creatinine, Ser: 0.96 mg/dL (ref 0.61–1.24)
GFR, Estimated: >60 mL/min (ref >60)
Glucose, Bld: 88 mg/dL (ref 70–99)
Potassium: 3.7 mmol/L (ref 3.5–5.1)
Sodium: 146 mmol/L — ABNORMAL HIGH (ref 135–145)

## 2020-05-28 LAB — URINE CULTURE: Culture: NO GROWTH

## 2020-05-28 LAB — CBC
HCT: 35.4 % — ABNORMAL LOW (ref 39.0–52.0)
Hemoglobin: 10.8 g/dL — ABNORMAL LOW (ref 13.0–17.0)
MCH: 31.6 pg (ref 26.0–34.0)
MCHC: 30.5 g/dL (ref 30.0–36.0)
MCV: 103.5 fL — ABNORMAL HIGH (ref 80.0–100.0)
Platelets: 135 10*3/uL — ABNORMAL LOW (ref 150–400)
RBC: 3.42 MIL/uL — ABNORMAL LOW (ref 4.22–5.81)
RDW: 12.6 % (ref 11.5–15.5)
WBC: 9.1 10*3/uL (ref 4.0–10.5)
nRBC: 0 % (ref 0.0–0.2)

## 2020-05-28 LAB — GLUCOSE, CAPILLARY
Glucose-Capillary: 131 mg/dL — ABNORMAL HIGH (ref 70–99)
Glucose-Capillary: 140 mg/dL — ABNORMAL HIGH (ref 70–99)
Glucose-Capillary: 65 mg/dL — ABNORMAL LOW (ref 70–99)
Glucose-Capillary: 77 mg/dL (ref 70–99)
Glucose-Capillary: 89 mg/dL (ref 70–99)

## 2020-05-28 LAB — PHOSPHORUS: Phosphorus: 1.2 mg/dL — ABNORMAL LOW (ref 2.5–4.6)

## 2020-05-28 LAB — SURGICAL PATHOLOGY

## 2020-05-28 LAB — PROCALCITONIN: Procalcitonin: 2.58 ng/mL

## 2020-05-28 LAB — MAGNESIUM: Magnesium: 2 mg/dL (ref 1.7–2.4)

## 2020-05-28 MED ORDER — DEXTROSE 50 % IV SOLN
INTRAVENOUS | Status: AC
  Filled 2020-05-28: qty 50

## 2020-05-28 MED ORDER — DEXTROSE IN LACTATED RINGERS 5 % IV SOLN: INTRAVENOUS | Status: AC

## 2020-05-28 MED ORDER — ENOXAPARIN SODIUM 40 MG/0.4ML ~~LOC~~ SOLN
40.0000 mg | SUBCUTANEOUS | Status: DC
  Administered 2020-05-28 – 2020-06-15 (×19): 40 mg via SUBCUTANEOUS
  Filled 2020-05-28 (×19): qty 0.4

## 2020-05-28 MED ORDER — DILTIAZEM HCL-DEXTROSE 125-5 MG/125ML-% IV SOLN (PREMIX)
5.0000 mg/h | INTRAVENOUS | Status: DC
  Administered 2020-05-28 (×2): 5 mg/h via INTRAVENOUS
  Administered 2020-05-29: 15 mg/h via INTRAVENOUS
  Filled 2020-05-28 (×3): qty 125

## 2020-05-28 MED ORDER — POTASSIUM PHOSPHATES 15 MMOLE/5ML IV SOLN
25.0000 mmol | Freq: Once | INTRAVENOUS | Status: AC
  Administered 2020-05-28: 25 mmol via INTRAVENOUS
  Filled 2020-05-28: qty 8.33

## 2020-05-28 MED ORDER — SODIUM CHLORIDE 0.9 % IV SOLN
1.0000 g | Freq: Three times a day (TID) | INTRAVENOUS | Status: AC
  Administered 2020-05-28 – 2020-06-02 (×16): 1 g via INTRAVENOUS
  Filled 2020-05-28 (×17): qty 1

## 2020-05-28 MED ORDER — DEXTROSE 50 % IV SOLN
12.5000 g | Freq: Once | INTRAVENOUS | Status: AC
  Administered 2020-05-28: 12.5 g via INTRAVENOUS

## 2020-05-28 NOTE — Progress Notes (Addendum)
[facility] Day(s): 1.   Post op day(s): 1 Day Post-Op.   Interval History:  Patient seen and examined, Extubated last night at 1700, off vasopressors early this morning Still intermittently tachycardic; 140s this morning Patient alert, confused, does not contribute much to history Labs are pending this morning UO - 1.4L; foley in place  NGT output 450 ccs; he did remove this Continues on Levaquin + Flagyl  Vital signs in last 24 hours: [min-max] current  Temp:  [97.6 F (36.4 C)-98.8 F (37.1 C)] 97.7 F (36.5 C) (12/10 0400) Pulse Rate:  [44-103] 103 (12/10 0700) Resp:  [10-35] 16 (12/10 0700) BP: (78-139)/(55-97) 135/83 (12/10 0700) SpO2:  [83 %-100 %] 83 % (12/10 0700) FiO2 (%):  [35 %-60 %] 35 % (12/09 1415) Weight:  [74.3 kg] 74.3 kg (12/10 0500)       Weight: 74.3 kg BMI (Calculated): 21.02   Intake/Output last 2 shifts:  12/09 0701 - 12/10 0700 In: 2860.3 [I.V.:1062.1; IV Piggyback:1798.2] Out: 1930 [Urine:1480; Emesis/NG output:450]   Physical Exam:  Constitutional: alert, cooperative and no distress; does appear confused Respiratory: breathing non-labored at rest  Cardiovascular: tachycardic to the 140-150s Gastrointestinal: Soft, no appreciable tenderness, he is still remarkably tympanic throughout, no rebound or guarding Genitourinary: Foley in palce Integumentary: Right inguinal and lower midline incisions are both CDI with staples and honeycomb dressing  Labs:  CBC Latest Ref Rng & Units 05/27/2020 05/26/2020 05/18/2020  WBC 4.0 - 10.5 K/uL 4.7 9.4 6.5  Hemoglobin 13.0 - 17.0 g/dL 12.7(L) 14.6 13.7  Hematocrit 39.0 - 52.0 % 42.4 47.5 41.8  Platelets 150 - 400 K/uL 178 269 235   CMP Latest Ref Rng & Units 05/27/2020 05/26/2020 05/18/2020  Glucose 70 - 99 mg/dL 122(H) 123(H) 126(H)  BUN 8 - 23 mg/dL 42(H) 42(H) 24(H)  Creatinine 0.61 - 1.24 mg/dL 0.90 0.89 1.05  Sodium 135 - 145 mmol/L 146(H) 149(H) 138   Potassium 3.5 - 5.1 mmol/L 3.4(L) 3.2(L) 4.0  Chloride 98 - 111 mmol/L 109 105 103  CO2 22 - 32 mmol/L 25 31 24   Calcium 8.9 - 10.3 mg/dL 9.4 10.0 9.0  Total Protein 6.5 - 8.1 g/dL - 9.2(H) 8.8(H)  Total Bilirubin 0.3 - 1.2 mg/dL - 1.3(H) 1.1  Alkaline Phos 38 - 126 U/L - 53 61  AST 15 - 41 U/L - 33 28  ALT 0 - 44 U/L - 24 22     Imaging studies: No new pertinent imaging studies   Assessment/Plan:  79 y.o. male 1 Day Post-Op s/p exploratory laparotomy, right inguinal hernia repair, small bowel resection, and orchiectomy for strangulated right inguinal hernia with right spermatic cord abscess   - Patient removed NGT. Would recommend leaving NPO given degree of bowel distension. If he develops nausea/emesis, then would recommend replacing NGT. Suspect he will have some degree of ileus.    - Continue IVF support  - Monitor abdominal examination; on-going bowel function  - May need to consider serial KUBs; I am not sure he will contribute much subjective data  - Pain control prn; antiemetics prn   - Continue ABx (Levaquin + Flagyl)  - Appreciate PCCM assistance; off pressors and ventilator; will need cardiac rate control  - Okay for anticoagulation from surgical standpoint   - NOT ready for floor status  All of the above findings and recommendations were discussed with the medical team  -- Edison Simon, PA-C [facility] 05/28/2020, 7:11 AM [phone] M-F:  7am - 4pm

## 2020-05-28 NOTE — Progress Notes (Signed)
Pts HR irregular and rate up to 120's at times. Pt placed on diltiazem drip and currently running at 5mg /hr. Pt pulled out NG tube and PIV, mitts placed for safety. Md instructed to watch for any signs of N/V and replace NG if it occurs. No c/o of N/V throughout day and no c/o pain. Very minimal bowel sounds heard in all quadrants. Pt removed O2 and is currently on RA and o2 sats 92-94%. Am BG was 65, D50 given and continuous infusion of D5/LR started. All other VSS. Family called an updated. Will continue to monitor.

## 2020-05-28 NOTE — Consult Note (Signed)
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 Patient subsequently found to have incarcerated right inguinal hernia with strangulated (gangrenous) knuckle of small bowel with perforation, SBO, and right spermatic cord abscess. He is POD # 1 from exploratory laparotomy, right inguinal hernia repair, small bowel resection, and orchiectomy. Patient is currently NPO. No nutritional support at this time. Pharmacy consulted to assist with electrolyte monitoring and replacement as indicated.  Goal of Therapy:  K 4 Mg 2 All other electrolytes within normal limits  Plan:  --K 3.7, Phos 1.2. Will order IV potassium phosphate 25 mmol x 1 --Follow-up electrolytes tomorrow AM  Benita Gutter 05/28/2020 9:30 AM

## 2020-05-28 NOTE — Progress Notes (Signed)
CRITICAL CARE NOTE  CC  follow up  Incarcerated right inguinal hernia with strangulated (gangrenous) knuckle of small bowel with perforation, small bowel obstruction and right spermatic cord abscess.  SUBJECTIVE Patient remains critically ill. He is confused but awake and answers simple questions. He complains of diffuse body pain. No fever chills or rigors documented overnight. No nausea vomiting. Remains in A. fib with RVR      SIGNIFICANT EVENTS Extubated 12/9 Surgery as in CC     BP 135/83   Pulse (!) 103   Temp 97.7 F (36.5 C) (Axillary)   Resp 16   Wt 74.3 kg   SpO2 (!) 83%   BMI 21.03 kg/m    REVIEW OF SYSTEMS  PATIENT IS UNABLE TO PROVIDE COMPLETE REVIEW OF SYSTEMS DUE TO SEVERE CRITICAL ILLNESS /confusion  PHYSICAL EXAMINATION:  GENERAL:critically ill appearing, no resp distress HEAD: Normocephalic, atraumatic.  EYES: Pupils equal, round, reactive to light.  No scleral icterus.  MOUTH: Moist mucosal membrane. NECK: Supple. No thyromegaly. No nodules. No JVD.  PULMONARY: No rhonchi or wheezing decreased breath sounds at bases CARDIOVASCULAR: A. fib with RVR ,S1 and S2. Irregular rate and rhythm. No murmurs, rubs, or gallops.  GASTROINTESTINAL: Soft, mild tenderness around the right right groin wound, notdistended. No masses. Absent  bowel sounds. No hepatosplenomegaly.  Right groin wound staples intact without any evidence of edema erythema. MUSCULOSKELETAL: No swelling, clubbing, or edema.  NEUROLOGIC: Moves all extremities confused answer simple questions  SKIN:intact,warm,dry  INTAKE/OUTPUT  Intake/Output Summary (Last 24 hours) at 05/28/2020 0753 Last data filed at 05/28/2020 0600 Gross per 24 hour  Intake 2860.32 ml  Output 1930 ml  Net 930.32 ml    LABS  CBC Recent Labs  Lab 05/26/20 2024 05/27/20 0622  WBC 9.4 4.7  HGB 14.6 12.7*  HCT 47.5 42.4  PLT 269 178   Coag's Recent Labs  Lab 05/26/20 2230  APTT 24  INR 1.3*    BMET Recent Labs  Lab 05/26/20 2024 05/27/20 0622  NA 149* 146*  K 3.2* 3.4*  CL 105 109  CO2 31 25  BUN 42* 42*  CREATININE 0.89 0.90  GLUCOSE 123* 122*   Electrolytes Recent Labs  Lab 05/26/20 2024 05/27/20 0622  CALCIUM 10.0 9.4  MG  --  2.2  PHOS  --  2.8   Sepsis Markers Recent Labs  Lab 05/26/20 2024 05/26/20 2230 05/27/20 0622  LATICACIDVEN 3.0* 1.8  --   PROCALCITON  --   --  0.25   ABG No results for input(s): PHART, PCO2ART, PO2ART in the last 168 hours. Liver Enzymes Recent Labs  Lab 05/26/20 2024  AST 33  ALT 24  ALKPHOS 53  BILITOT 1.3*  ALBUMIN 3.0*   Cardiac Enzymes No results for input(s): TROPONINI, PROBNP in the last 168 hours. Glucose Recent Labs  Lab 05/27/20 0508 05/27/20 2045 05/28/20 0005 05/28/20 0326  GLUCAP 86 88 89 77     Recent Results (from the past 240 hour(s))  Blood culture (routine single)     Status: None (Preliminary result)   Collection Time: 05/26/20 10:30 PM   Specimen: BLOOD  Result Value Ref Range Status   Specimen Description BLOOD RIGHT ANTECUBITAL  Final   Special Requests   Final    BOTTLES DRAWN AEROBIC AND ANAEROBIC Blood Culture results may not be optimal due to an inadequate volume of blood received in culture bottles   Culture   Final    NO GROWTH 2 DAYS Performed at  [facility], [address] [place], [place] [address]    Report Status PENDING  Incomplete  Resp Panel by RT-PCR (Flu A&B, Covid) Nasopharyngeal Swab     Status: None   Collection Time: 05/27/20 12:37 AM   Specimen: Nasopharyngeal Swab; Nasopharyngeal(NP) swabs in vial transport medium  Result Value Ref Range Status   SARS Coronavirus 2 by RT PCR NEGATIVE NEGATIVE Final    Comment: (NOTE) SARS-CoV-2 target nucleic acids are NOT DETECTED.  The SARS-CoV-2 RNA is generally detectable in upper respiratory specimens during the acute phase of infection. The lowest concentration of SARS-CoV-2 viral copies this assay  can detect is 138 copies/mL. A negative result does not preclude SARS-Cov-2 infection and should not be used as the sole basis for treatment or other patient management decisions. A negative result may occur with  improper specimen collection/handling, submission of specimen other than nasopharyngeal swab, presence of viral mutation(s) within the areas targeted by this assay, and inadequate number of viral copies(<138 copies/mL). A negative result must be combined with clinical observations, patient history, and epidemiological information. The expected result is Negative.  Fact Sheet for Patients:  EntrepreneurPulse.com.au  Fact Sheet for Healthcare Providers:  IncredibleEmployment.be  This test is no t yet approved or cleared by the [place] FDA and  has been authorized for detection and/or diagnosis of SARS-CoV-2 by FDA under an Emergency Use Authorization (EUA). This EUA will remain  in effect (meaning this test can be used) for the duration of the COVID-19 declaration under Section 564(b)(1) of the Act, 21 U.S.C.section 360bbb-3(b)(1), unless the authorization is terminated  or revoked sooner.       Influenza A by PCR NEGATIVE NEGATIVE Final   Influenza B by PCR NEGATIVE NEGATIVE Final    Comment: (NOTE) The Xpert Xpress SARS-CoV-2/FLU/RSV plus assay is intended as an aid in the diagnosis of influenza from Nasopharyngeal swab specimens and should not be used as a sole basis for treatment. Nasal washings and aspirates are unacceptable for Xpert Xpress SARS-CoV-2/FLU/RSV testing.  Fact Sheet for Patients: EntrepreneurPulse.com.au  Fact Sheet for Healthcare Providers: IncredibleEmployment.be  This test is not yet approved or cleared by the [place] FDA and has been authorized for detection and/or diagnosis of SARS-CoV-2 by FDA under an Emergency Use Authorization (EUA). This EUA will  remain in effect (meaning this test can be used) for the duration of the COVID-19 declaration under Section 564(b)(1) of the Act, 21 U.S.C. section 360bbb-3(b)(1), unless the authorization is terminated or revoked.  Performed at [facility], [address] [place], [place] [address]   MRSA PCR Screening     Status: None   Collection Time: 05/27/20  5:00 AM   Specimen: Nasopharyngeal  Result Value Ref Range Status   MRSA by PCR NEGATIVE NEGATIVE Final    Comment:        The GeneXpert MRSA Assay (FDA approved for NASAL specimens only), is one component of a comprehensive MRSA colonization surveillance program. It is not intended to diagnose MRSA infection nor to guide or monitor treatment for MRSA infections. Performed at [facility], [address], [place], [place] [address]     MEDICATIONS   Current Facility-Administered Medications:  .  0.9 %  sodium chloride infusion, 250 mL, Intravenous, Continuous, Awilda Bill, NP, Stopped at 05/27/20 1207 .  acetaminophen (TYLENOL) tablet 650 mg, 650 mg, Oral, Q6H PRN **OR** acetaminophen (TYLENOL) suppository 650 mg, 650 mg, Rectal, Q6H PRN, Athena Masse, MD .  Chlorhexidine  Gluconate Cloth 2 % PADS 6 each, 6 each, Topical, Daily, Flora Lipps, MD, 6 each at 05/27/20 [id number] .  dexmedetomidine (PRECEDEX) 400 MCG/100ML (4 mcg/mL) infusion, 0.4-1.2 mcg/kg/hr, Intravenous, Titrated, Kasa, Kurian, MD .  fentaNYL (SUBLIMAZE) bolus via infusion 25 mcg, 25 mcg, Intravenous, Q15 min PRN, Awilda Bill, NP .  fentaNYL 2565mcg in NS 242mL (65mcg/ml) infusion-PREMIX, 25-200 mcg/hr, Intravenous, Continuous, Awilda Bill, NP, Stopped at 05/27/20 1203 .  lactated ringers bolus 1,000 mL, 1,000 mL, Intravenous, Once, Nena Polio, MD .  lactated ringers infusion, , Intravenous, Continuous, Athena Masse, MD, Last Rate: 125 mL/hr at 05/28/20 0625, New Bag at 05/28/20 0625 .  levofloxacin (LEVAQUIN) IVPB 500 mg,  500 mg, Intravenous, Q24H, Ronny Bacon, MD, Stopped at 05/27/20 1123 .  metroNIDAZOLE (FLAGYL) IVPB 500 mg, 500 mg, Intravenous, Q8H, Awilda Bill, NP, Stopped at 05/28/20 0436 .  midazolam (VERSED) injection 1 mg, 1 mg, Intravenous, Q15 min PRN, Awilda Bill, NP .  midazolam (VERSED) injection 1 mg, 1 mg, Intravenous, Q2H PRN, Awilda Bill, NP .  norepinephrine (LEVOPHED) 4mg  in 264mL premix infusion, 0-40 mcg/min, Intravenous, Continuous, Nena Polio, MD .  ondansetron (ZOFRAN) tablet 4 mg, 4 mg, Oral, Q6H PRN **OR** ondansetron (ZOFRAN) injection 4 mg, 4 mg, Intravenous, Q6H PRN, Judd Gaudier V, MD .  pantoprazole (PROTONIX) injection 40 mg, 40 mg, Intravenous, Daily, Awilda Bill, NP, 40 mg at 05/27/20 0535 .  phenylephrine (NEOSYNEPHRINE) 10-0.9 MG/250ML-% infusion, 25-200 mcg/min, Intravenous, Titrated, Blakeney, Dreama Saa, NP, Stopped at 05/28/20 0100      Indwelling Urinary Catheter continued, requirement due to   Reason to continue Indwelling Urinary Catheter for strict Intake/Output monitoring for hemodynamic instability   Central Line continued, requirement due to   Reason to continue [employer] Monitoring of central venous pressure or other hemodynamic parameters   Ventilator continued, requirement due to, resp failure    Ventilator Sedation RASS 0 to -2     ASSESSMENT AND PLAN SYNOPSIS   1.Incarcerated right inguinal hernia with strangulated (gangrenous) knuckle of small bowel with perforation, small bowel obstruction and right spermatic cord abscess. No bowel sounds, cont npo, pulled out NG tube, may need placement if vomits Discussed with surgical PA. Follow all cultures.   2.  A f ib with RVR start dilt drip, avoid full anticoagulation for now ? Candidate for full AC,   3. Elevated troponin likely demand ischemia secondary to small bowel obstruction  Hx: HTN, HLD, CAD, and TIA Follow troponin, start IV dilt as  above   NEUROLOGY Awake, follows simple commands  Mild hypoxemia cont o2 at 2 L  ID -continue IV abx as prescibed -follow up cultures  GI/Nutrition NPO for now due to absent bowel sounds and postop status.   ENDO --check FSBS per protocol   ELECTROLYTES -follow labs as needed -replace as needed -pharmacy consultation and following   DVT/GI PRX ordered TRANSFUSIONS AS NEEDED MONITOR FSBS ASSESS the need for LABS as needed   Critical Care Time devoted to patient care services described in this note is 40  minutes.   Overall, patient is critically ill, prognosis is guarded.   Rosine Door, MD  05/28/2020 7:53 AM [facility]

## 2020-05-29 DIAGNOSIS — I4891 Unspecified atrial fibrillation: Secondary | ICD-10-CM

## 2020-05-29 LAB — MAGNESIUM
Magnesium: 1.9 mg/dL (ref 1.7–2.4)
Magnesium: 2.1 mg/dL (ref 1.7–2.4)

## 2020-05-29 LAB — CBC
HCT: 35.4 % — ABNORMAL LOW (ref 39.0–52.0)
Hemoglobin: 10.9 g/dL — ABNORMAL LOW (ref 13.0–17.0)
MCH: 31.1 pg (ref 26.0–34.0)
MCHC: 30.8 g/dL (ref 30.0–36.0)
MCV: 100.9 fL — ABNORMAL HIGH (ref 80.0–100.0)
Platelets: 149 10*3/uL — ABNORMAL LOW (ref 150–400)
RBC: 3.51 MIL/uL — ABNORMAL LOW (ref 4.22–5.81)
RDW: 12.5 % (ref 11.5–15.5)
WBC: 12.2 10*3/uL — ABNORMAL HIGH (ref 4.0–10.5)
nRBC: 0 % (ref 0.0–0.2)

## 2020-05-29 LAB — GLUCOSE, CAPILLARY
Glucose-Capillary: 125 mg/dL — ABNORMAL HIGH (ref 70–99)
Glucose-Capillary: 136 mg/dL — ABNORMAL HIGH (ref 70–99)
Glucose-Capillary: 136 mg/dL — ABNORMAL HIGH (ref 70–99)
Glucose-Capillary: 147 mg/dL — ABNORMAL HIGH (ref 70–99)
Glucose-Capillary: 152 mg/dL — ABNORMAL HIGH (ref 70–99)
Glucose-Capillary: 167 mg/dL — ABNORMAL HIGH (ref 70–99)

## 2020-05-29 LAB — BASIC METABOLIC PANEL WITH GFR
Anion gap: 6 (ref 5–15)
BUN: 23 mg/dL (ref 8–23)
CO2: 29 mmol/L (ref 22–32)
Calcium: 9.3 mg/dL (ref 8.9–10.3)
Chloride: 112 mmol/L — ABNORMAL HIGH (ref 98–111)
Creatinine, Ser: 0.79 mg/dL (ref 0.61–1.24)
GFR, Estimated: >60 mL/min
Glucose, Bld: 148 mg/dL — ABNORMAL HIGH (ref 70–99)
Potassium: 3.3 mmol/L — ABNORMAL LOW (ref 3.5–5.1)
Sodium: 147 mmol/L — ABNORMAL HIGH (ref 135–145)

## 2020-05-29 LAB — PROCALCITONIN: Procalcitonin: 1.62 ng/mL

## 2020-05-29 LAB — PHOSPHORUS
Phosphorus: <1 mg/dL — CL (ref 2.5–4.6)
Phosphorus: 1.2 mg/dL — ABNORMAL LOW (ref 2.5–4.6)

## 2020-05-29 LAB — POTASSIUM: Potassium: 3 mmol/L — ABNORMAL LOW (ref 3.5–5.1)

## 2020-05-29 MED ORDER — HALOPERIDOL LACTATE 5 MG/ML IJ SOLN
INTRAMUSCULAR | Status: AC
  Administered 2020-05-29: 1 mg via INTRAVENOUS
  Filled 2020-05-29: qty 1

## 2020-05-29 MED ORDER — METOPROLOL TARTRATE 5 MG/5ML IV SOLN
5.0000 mg | Freq: Four times a day (QID) | INTRAVENOUS | Status: DC
  Administered 2020-05-29 – 2020-06-04 (×23): 5 mg via INTRAVENOUS
  Filled 2020-05-29 (×24): qty 5

## 2020-05-29 MED ORDER — MAGNESIUM SULFATE 2 GM/50ML IV SOLN
2.0000 g | Freq: Once | INTRAVENOUS | Status: AC
  Administered 2020-05-29: 2 g via INTRAVENOUS
  Filled 2020-05-29: qty 50

## 2020-05-29 MED ORDER — METOPROLOL TARTRATE 5 MG/5ML IV SOLN
INTRAVENOUS | Status: AC
  Administered 2020-05-29: 5 mg via INTRAVENOUS
  Filled 2020-05-29: qty 5

## 2020-05-29 MED ORDER — POTASSIUM PHOSPHATES 15 MMOLE/5ML IV SOLN
45.0000 mmol | Freq: Once | INTRAVENOUS | Status: AC
  Administered 2020-05-29: 45 mmol via INTRAVENOUS
  Filled 2020-05-29: qty 15

## 2020-05-29 MED ORDER — METOPROLOL TARTRATE 5 MG/5ML IV SOLN
5.0000 mg | Freq: Every day | INTRAVENOUS | Status: DC
Start: 2020-05-29 — End: 2020-05-29

## 2020-05-29 MED ORDER — HALOPERIDOL LACTATE 5 MG/ML IJ SOLN: 1.0000 mg | Freq: Once | INTRAMUSCULAR | Status: AC

## 2020-05-29 MED ORDER — LORAZEPAM 0.5 MG PO TABS
0.5000 mg | ORAL_TABLET | Freq: Four times a day (QID) | ORAL | Status: DC | PRN
  Administered 2020-06-03 – 2020-06-11 (×4): 0.5 mg via ORAL
  Filled 2020-05-29 (×4): qty 1

## 2020-05-29 MED ORDER — ACETAMINOPHEN 10 MG/ML IV SOLN
1000.0000 mg | Freq: Four times a day (QID) | INTRAVENOUS | Status: AC
  Administered 2020-05-29 – 2020-05-30 (×4): 1000 mg via INTRAVENOUS
  Filled 2020-05-29 (×4): qty 100

## 2020-05-29 MED ORDER — LORAZEPAM 2 MG/ML IJ SOLN
0.5000 mg | Freq: Four times a day (QID) | INTRAMUSCULAR | Status: DC | PRN
  Administered 2020-05-29 – 2020-06-13 (×11): 0.5 mg via INTRAVENOUS
  Filled 2020-05-29 (×11): qty 1

## 2020-05-29 NOTE — Progress Notes (Signed)
CRITICAL VALUE ALERT  Critical Value:  Phos < 1  Date & Time Notied:  05/29/20 at [place]  Provider Notified: Domingo Pulse Rust-Chester, NP  Orders Received/Actions taken: Awaiting new orders

## 2020-05-29 NOTE — Progress Notes (Signed)
[facility] Day(s): 2.   Post op day(s): 2 Days Post-Op.   Interval History: Patient seen and examined, no acute events or new complaints overnight. Patient reports no pain.  Patient with reliable history due to baseline mental status.  There has been no nausea or vomiting.  There has also been no sign of bowel function.  Vital signs in last 24 hours: [min-max] current  Temp:  [97 F (36.1 C)-98.5 F (36.9 C)] 97.8 F (36.6 C) (12/11 0400) Pulse Rate:  [64-110] 84 (12/11 0600) Resp:  [10-23] 16 (12/11 0600) BP: (113-157)/(66-116) 157/77 (12/11 0600) SpO2:  [91 %-97 %] 96 % (12/11 0600)       Weight: 74.3 kg BMI (Calculated): 21.02   Physical Exam:  Constitutional: alert, and no distress  Respiratory: breathing non-labored at rest  Cardiovascular: regular rate and sinus rhythm  Gastrointestinal: soft, non-tender, and mild-distended.  Wounds are dry and clean  Labs:  CBC Latest Ref Rng & Units 05/29/2020 05/28/2020 05/27/2020  WBC 4.0 - 10.5 K/uL 12.2(H) 9.1 4.7  Hemoglobin 13.0 - 17.0 g/dL 10.9(L) 10.8(L) 12.7(L)  Hematocrit 39.0 - 52.0 % 35.4(L) 35.4(L) 42.4  Platelets 150 - 400 K/uL 149(L) 135(L) 178   CMP Latest Ref Rng & Units 05/29/2020 05/28/2020 05/27/2020  Glucose 70 - 99 mg/dL 148(H) 88 122(H)  BUN 8 - 23 mg/dL 23 31(H) 42(H)  Creatinine 0.61 - 1.24 mg/dL 0.79 0.96 0.90  Sodium 135 - 145 mmol/L 147(H) 146(H) 146(H)  Potassium 3.5 - 5.1 mmol/L 3.3(L) 3.7 3.4(L)  Chloride 98 - 111 mmol/L 112(H) 112(H) 109  CO2 22 - 32 mmol/L 29 28 25   Calcium 8.9 - 10.3 mg/dL 9.3 9.4 9.4  Total Protein 6.5 - 8.1 g/dL - - -  Total Bilirubin 0.3 - 1.2 mg/dL - - -  Alkaline Phos 38 - 126 U/L - - -  AST 15 - 41 U/L - - -  ALT 0 - 44 U/L - - -    Imaging studies: No new pertinent imaging studies   Assessment/Plan:  79 y.o. male with strangulated inguinal hernia 2 Days Post-Op s/p small bowel resection and anastomosis and orchiectomy, complicated by pertinent  comorbidities including coronary artery disease, hypertension, history of TIA.  Patient without any clinical deterioration.  Still no sign of bowel function.  We will keep patient n.p.o.  Wounds are dry and clean.  There has been no sign of complication at this moment.  Agreed to continue with IV antibiotic therapy.  Postoperative ileus is suspected.  We will continue to follow clinically.  Appreciate hospitalist and intensive care management of medical comorbidities.   Arnold Long, MD

## 2020-05-29 NOTE — Consult Note (Signed)
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 Patient subsequently found to have incarcerated right inguinal hernia with strangulated (gangrenous) knuckle of small bowel with perforation, SBO, and right spermatic cord abscess. He is POD # 1 from exploratory laparotomy, right inguinal hernia repair, small bowel resection, and orchiectomy. Patient is currently NPO. No nutritional support at this time. Pharmacy consulted to assist with electrolyte monitoring and replacement as indicated.  Goal of Therapy:  K 4 Mg 2 All other electrolytes within normal limits  Plan:  -12/11 @ 0039: K 3.3,Mag 1.9  Phos <1.0  Scr 0.79.  NP ordered IV potassium phosphate 45 mmol x 1 (hung at 0232) and Mag 2gm IV x 1 -will recheck K/Phos at 1400 today 12/11 --Follow-up electrolytes tomorrow AM  Craig [name] A 05/29/2020 9:28 AM

## 2020-05-29 NOTE — Consult Note (Signed)
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 Patient subsequently found to have incarcerated right inguinal hernia with strangulated (gangrenous) knuckle of small bowel with perforation, SBO, and right spermatic cord abscess. He is POD # 1 from exploratory laparotomy, right inguinal hernia repair, small bowel resection, and orchiectomy. Patient is currently NPO. No nutritional support at this time. Pharmacy consulted to assist with electrolyte monitoring and replacement as indicated.  Goal of Therapy:  K 4 Mg 2 All other electrolytes within normal limits  Plan:  -12/11 @ 0039: K 3.3,Mag 1.9  Phos <1.0  Scr 0.79.  NP ordered IV potassium phosphate 45 mmol x 1 (hung at 0232) and Mag 2gm IV x 1 -will recheck K/Phos at 1400 today 12/11 --Follow-up electrolytes tomorrow AM  12/11 @1338   K 3.0  Mag 2.1  Phos 1.2   Will order Potassium Phosphate 45 mmol IV x 1 again today.  Pt is NPO. Postop day 2-s/p small bowel resection and anastomosis and orchiectomy. F/u electrolytes in am  Craig Craig [name] 05/29/2020 2:36 PM

## 2020-05-29 NOTE — Progress Notes (Addendum)
PROGRESS NOTE    Craig [name]  TIW:[id number] DOB: 1940/09/08 DOA: 05/26/2020 PCP: [name], MD    Brief Narrative:  Craig [name] is a 79 y.o. male with 2 to 3-day history of progressive abdominal pain distention. Found with High-grade small bowel obstruction with a transition point at the entrance of a right inguinal hernia. No bowel perforation. Recommend surgical consultation.  Patient went to surgery for incarcerated right inguinal hernia and small bowel obstruction on 12/9.  Was transferred to ICU.  Was extubated on 12/9. PCCM pickup on 12/11.   Pt pulled out NGT. Currently NPO. Also now in afib rvr started on dilt gtt.     Consultants:   Pccm, surgery, cardioloyg  Procedures:   Antimicrobials:   Meropenem   Subjective: Confused, trying to get out of bed, in mittens, calms down when trying to talk to him  Objective: Vitals:   05/29/20 0300 05/29/20 0400 05/29/20 0500 05/29/20 0600  BP: 132/73 (!) 141/76 139/78 (!) 157/77  Pulse: (!) 102 68 67 84  Resp: 14 12 18 16   Temp:  97.8 F (36.6 C)    TempSrc:  Axillary    SpO2: 95% 95% 96% 96%  Weight:        Intake/Output Summary (Last 24 hours) at 05/29/2020 1654 Last data filed at 05/29/2020 0600 Gross per 24 hour  Intake 3408.14 ml  Output 775 ml  Net 2633.14 ml   Filed Weights   05/27/20 0508 05/28/20 0500  Weight: 70.9 kg 74.3 kg    Examination:  General exam: In mittens, trying to get out of bed Respiratory system: Clear to auscultation.  Poor respiratory effort Cardiovascular system: S1 & S2 heard, irregular, tacky Gastrointestinal system: Soft, mildly distended, decreased bowel sounds Central nervous system: Awake, confused.  Grossly appears to be intact Extremities: No edema    Data Reviewed: I have personally reviewed following labs and imaging studies  CBC: Recent Labs  Lab 05/26/20 2024 05/27/20 0622 05/28/20 0654 05/29/20 0039  WBC 9.4 4.7 9.1 12.2*  NEUTROABS  8.1*  --   --   --   HGB 14.6 12.7* 10.8* 10.9*  HCT 47.5 42.4 35.4* 35.4*  MCV 100.6* 104.2* 103.5* 100.9*  PLT 269 178 135* 250*   Basic Metabolic Panel: Recent Labs  Lab 05/26/20 2024 05/27/20 0622 05/28/20 0654 05/29/20 0039 05/29/20 1338  NA 149* 146* 146* 147*  --   K 3.2* 3.4* 3.7 3.3* 3.0*  CL 105 109 112* 112*  --   CO2 31 25 28 29   --   GLUCOSE 123* 122* 88 148*  --   BUN 42* 42* 31* 23  --   CREATININE 0.89 0.90 0.96 0.79  --   CALCIUM 10.0 9.4 9.4 9.3  --   MG  --  2.2 2.0 1.9 2.1  PHOS  --  2.8 1.2* <1.0* 1.2*   GFR: Estimated Creatinine Clearance: 81.3 mL/min (by C-G formula based on SCr of 0.79 mg/dL). Liver Function Tests: Recent Labs  Lab 05/26/20 2024  AST 33  ALT 24  ALKPHOS 53  BILITOT 1.3*  PROT 9.2*  ALBUMIN 3.0*   Recent Labs  Lab 05/26/20 2024  LIPASE 42   No results for input(s): AMMONIA in the last 168 hours. Coagulation Profile: Recent Labs  Lab 05/26/20 2230  INR 1.3*   Cardiac Enzymes: No results for input(s): CKTOTAL, CKMB, CKMBINDEX, TROPONINI in the last 168 hours. BNP (last 3 results) No results for input(s): PROBNP in the  last 8760 hours. HbA1C: No results for input(s): HGBA1C in the last 72 hours. CBG: Recent Labs  Lab 05/29/20 0036 05/29/20 0338 05/29/20 0744 05/29/20 1127 05/29/20 1635  GLUCAP 136* 147* 167* 136* 125*   Lipid Profile: No results for input(s): CHOL, HDL, LDLCALC, TRIG, CHOLHDL, LDLDIRECT in the last 72 hours. Thyroid Function Tests: No results for input(s): TSH, T4TOTAL, FREET4, T3FREE, THYROIDAB in the last 72 hours. Anemia Panel: No results for input(s): VITAMINB12, FOLATE, FERRITIN, TIBC, IRON, RETICCTPCT in the last 72 hours. Sepsis Labs: Recent Labs  Lab 05/26/20 2024 05/26/20 2230 05/27/20 0622 05/28/20 0654 05/29/20 0039  PROCALCITON  --   --  0.25 2.58 1.62  LATICACIDVEN 3.0* 1.8  --   --   --     Recent Results (from the past 240 hour(s))  Blood culture (routine single)      Status: None (Preliminary result)   Collection Time: 05/26/20 10:30 PM   Specimen: BLOOD  Result Value Ref Range Status   Specimen Description BLOOD RIGHT ANTECUBITAL  Final   Special Requests   Final    BOTTLES DRAWN AEROBIC AND ANAEROBIC Blood Culture results may not be optimal due to an inadequate volume of blood received in culture bottles   Culture   Final    NO GROWTH 3 DAYS Performed at [facility], [address] [place], [place] [address]    Report Status PENDING  Incomplete  Resp Panel by RT-PCR (Flu A&B, Covid) Nasopharyngeal Swab     Status: None   Collection Time: 05/27/20 12:37 AM   Specimen: Nasopharyngeal Swab; Nasopharyngeal(NP) swabs in vial transport medium  Result Value Ref Range Status   SARS Coronavirus 2 by RT PCR NEGATIVE NEGATIVE Final    Comment: (NOTE) SARS-CoV-2 target nucleic acids are NOT DETECTED.  The SARS-CoV-2 RNA is generally detectable in upper respiratory specimens during the acute phase of infection. The lowest concentration of SARS-CoV-2 viral copies this assay can detect is 138 copies/mL. A negative result does not preclude SARS-Cov-2 infection and should not be used as the sole basis for treatment or other patient management decisions. A negative result may occur with  improper specimen collection/handling, submission of specimen other than nasopharyngeal swab, presence of viral mutation(s) within the areas targeted by this assay, and inadequate number of viral copies(<138 copies/mL). A negative result must be combined with clinical observations, patient history, and epidemiological information. The expected result is Negative.  Fact Sheet for Patients:  EntrepreneurPulse.com.au  Fact Sheet for Healthcare Providers:  IncredibleEmployment.be  This test is no t yet approved or cleared by the [place] FDA and  has been authorized for detection and/or diagnosis of SARS-CoV-2 by FDA  under an Emergency Use Authorization (EUA). This EUA will remain  in effect (meaning this test can be used) for the duration of the COVID-19 declaration under Section 564(b)(1) of the Act, 21 U.S.C.section 360bbb-3(b)(1), unless the authorization is terminated  or revoked sooner.       Influenza A by PCR NEGATIVE NEGATIVE Final   Influenza B by PCR NEGATIVE NEGATIVE Final    Comment: (NOTE) The Xpert Xpress SARS-CoV-2/FLU/RSV plus assay is intended as an aid in the diagnosis of influenza from Nasopharyngeal swab specimens and should not be used as a sole basis for treatment. Nasal washings and aspirates are unacceptable for Xpert Xpress SARS-CoV-2/FLU/RSV testing.  Fact Sheet for Patients: EntrepreneurPulse.com.au  Fact Sheet for Healthcare Providers: IncredibleEmployment.be  This test is not yet approved or cleared by the [place]  FDA and has been authorized for detection and/or diagnosis of SARS-CoV-2 by FDA under an Emergency Use Authorization (EUA). This EUA will remain in effect (meaning this test can be used) for the duration of the COVID-19 declaration under Section 564(b)(1) of the Act, 21 U.S.C. section 360bbb-3(b)(1), unless the authorization is terminated or revoked.  Performed at [facility], [address] [place], [place] [address]   MRSA PCR Screening     Status: None   Collection Time: 05/27/20  5:00 AM   Specimen: Nasopharyngeal  Result Value Ref Range Status   MRSA by PCR NEGATIVE NEGATIVE Final    Comment:        The GeneXpert MRSA Assay (FDA approved for NASAL specimens only), is one component of a comprehensive MRSA colonization surveillance program. It is not intended to diagnose MRSA infection nor to guide or monitor treatment for MRSA infections. Performed at [facility], [address], [place], [place] [address]   Urine culture     Status: None   Collection Time: 05/27/20  5:43  AM   Specimen: In/Out Cath Urine  Result Value Ref Range Status   Specimen Description   Final    IN/OUT CATH URINE Performed at [facility], [address], [place], [place] [address]    Special Requests   Final    NONE Performed at [facility], [address] [place], [place] [address]    Culture   Final    NO GROWTH Performed at [facility], [place] [address] [place], [place] [address]    Report Status 05/28/2020 FINAL  Final         Radiology Studies: No results found.      Scheduled Meds: . Chlorhexidine Gluconate Cloth  6 each Topical Daily  . enoxaparin (LOVENOX) injection  40 mg Subcutaneous Q24H  . metoprolol tartrate      . metoprolol tartrate  5 mg Intravenous Q6H  . pantoprazole (PROTONIX) IV  40 mg Intravenous Daily   Continuous Infusions: . sodium chloride Stopped (05/27/20 1207)  . dextrose 5% lactated ringers 125 mL/hr at 05/29/20 0600  . diltiazem (CARDIZEM) infusion 15 mg/hr (05/29/20 1155)  . meropenem (MERREM) IV 1 g (05/29/20 1414)  . potassium PHOSPHATE IVPB (in mmol)      Assessment & Plan:   Principal Problem:   SBO (small bowel obstruction) (HCC) Active Problems:   CAD (coronary artery disease), native coronary artery   History of coronary artery stent placement   Essential hypertension   Dementia with behavioral disturbance (HCC)   Atrial flutter with rapid ventricular response (HCC)   Hypernatremia   Nicotine dependence   1.Incarcerated right inguinal hernia with strangulated(gangrenous)knuckle of small bowelwith perforation, small bowel obstruction and right spermatic cord abscess. On IV antibiotics Primary management per surgical team Has pulled out NG tube, currently n.p.o.   2.  AFib with RVR- Rate still not controlled, on diltiazem drip Cardiology consulted Need to assess question candidate for full anticoagulation.  Anticoagulation when okay from surgical standpoint Echo when heart  rate improved IV Lopressor   3.Acute metabolic encephalopathy superimposed on dementia with behavioral disturbance (HCC) Currently agitated, given Haldol but it has not helped. Will give some Ativan until he calms down and then discontinue  4. Elevated troponin -likely demand ischemia secondary to small bowel obstruction  Hx: HTN, HLD, CAD, and TIA Cards following, echo when heart rate improves currently n.p.o. When able to take po will resume home asa  5.Dementia- On Donepezil, held since pt NPO.   DVT prophylaxis: Lovenox Code Status: Full Family Communication: None at bedside  Status is: Inpatient  Remains inpatient appropriate because:Inpatient level of care appropriate due to severity of illness   Dispo: The patient is from: Home              Anticipated d/c is to: TBD              Anticipated d/c date is: 3 days              Patient currently is not medically stable to d/c.            LOS: 2 days   Time spent: 45 min with >50% on coc    Nolberto Hanlon, MD [facility] Pager [phone]  If 7PM-7AM, please contact night-coverage 05/29/2020, 4:54 PM

## 2020-05-29 NOTE — Consult Note (Signed)
Cardiology Consultation:   Patient ID: Craig [name] MRN: [id number]; DOB: 02-17-1941  Admit date: 05/26/2020 Date of Consult: 05/29/2020  Primary Care Provider: [name], MD [facility] Cardiologist: Rockey Situ [phone])  Patient Profile:   Craig [name] is a 79 y.o. male with a hx of CAD and atrial fibrillation  and  who is being seen today for the evaluation of afib with RVR  at the request of Dr Kurtis Bushman  History of Present Illness:   Craig [name] is a 79 yo who was seen once by T Gollan in 2019  Previously followed by Dr Clayborn Bigness.  The pt has a history of Atrial fib/flutter (refused ablation for aflutter in past), CAD (s/p Stent in 1990s), TIA, HL (refused Rx in past), syncope and dementia  Echo I 2017 LVEF normal   Recent admit in Nov with MS changes and atrial flutter with slow response     He was admitted now with change in mental status Found to have an ncarcerated hernia.   He is now s/p resection and repair.    Since admit he has been in afib, now Afib with RVR  He denies palpitations   No SOB  No CP  He says at home prior to admit he would walk some   He does not remember MI or stent   Past Medical History:  Diagnosis Date  . Atrial flutter ([place])   . Coronary artery disease   . Hyperlipidemia   . Hypertension   . TIA (transient ischemic attack)     Past Surgical History:  Procedure Laterality Date  . arm surgery Left   . BOWEL RESECTION  05/27/2020   Procedure: SMALL BOWEL RESECTION;  Surgeon: Ronny Bacon, MD;  Location: [facility] ORS;  Service: General;;  . HERNIA REPAIR    . INGUINAL HERNIA REPAIR Right 05/27/2020   Procedure: HERNIA REPAIR INGUINAL INCARCERATED;  Surgeon: Ronny Bacon, MD;  Location: [facility] ORS;  Service: General;  Laterality: Right;  . ORCHIECTOMY Right 05/27/2020   Procedure: ORCHIECTOMY;  Surgeon: Ronny Bacon, MD;  Location: [facility] ORS;  Service: General;  Laterality: Right;       Inpatient Medications: Scheduled Meds: .  Chlorhexidine Gluconate Cloth  6 each Topical Daily  . enoxaparin (LOVENOX) injection  40 mg Subcutaneous Q24H  . metoprolol tartrate      . metoprolol tartrate  5 mg Intravenous Q6H  . pantoprazole (PROTONIX) IV  40 mg Intravenous Daily   Continuous Infusions: . sodium chloride Stopped (05/27/20 1207)  . dextrose 5% lactated ringers 125 mL/hr at 05/29/20 0600  . diltiazem (CARDIZEM) infusion 15 mg/hr (05/29/20 1155)  . meropenem (MERREM) IV 1 g (05/29/20 1414)  . potassium PHOSPHATE IVPB (in mmol)     PRN Meds: acetaminophen **OR** acetaminophen, LORazepam **OR** LORazepam, ondansetron **OR** ondansetron (ZOFRAN) IV  Allergies:    Allergies  Allergen Reactions  . Penicillins     Social History:   Social History   Socioeconomic History  . Marital status: Divorced    Spouse name: Not on file  . Number of children: Not on file  . Years of education: Not on file  . Highest education level: Not on file  Occupational History  . Not on file  Tobacco Use  . Smoking status: Former Smoker    Packs/day: 0.00  . Smokeless tobacco: Current User    Types: Snuff  Substance and Sexual Activity  . Alcohol use: Yes  . Drug use: No  . Sexual activity: Not on  file  Other Topics Concern  . Not on file  Social History Narrative  . Not on file   Social Determinants of Health   Financial Resource Strain: Not on file  Food Insecurity: Not on file  Transportation Needs: Not on file  Physical Activity: Not on file  Stress: Not on file  Social Connections: Not on file  Intimate Partner Violence: Not on file    Family History:   Unable to obtain with mental status   ROS:  Please see the history of present illness.  Pt with no complaints  All other ROS reviewed and negative.     Physical Exam/Data:   Vitals:   05/29/20 0300 05/29/20 0400 05/29/20 0500 05/29/20 0600  BP: 132/73 (!) 141/76 139/78 (!) 157/77  Pulse: (!) 102 68 67 84  Resp: 14 12 18 16   Temp:  97.8 F (36.6 C)     TempSrc:  Axillary    SpO2: 95% 95% 96% 96%  Weight:        Intake/Output Summary (Last 24 hours) at 05/29/2020 1506 Last data filed at 05/29/2020 0600 Gross per 24 hour  Intake 3408.14 ml  Output 775 ml  Net 2633.14 ml   Last 3 Weights 05/28/2020 05/27/2020 05/18/2020  Weight (lbs) 163 lb 12.8 oz 156 lb 4.9 oz 165 lb  Weight (kg) 74.3 kg 70.9 kg 74.844 kg     Body mass index is 21.03 kg/m.  General:  Well nourished, well developed, in no acute distress HEENT: normal Lymph: no adenopathy Neck: no JVD  No bruits  Endocrine:  No thryomegaly Vascular: No carotid bruits; FA pulses 2+ bilaterally without bruits  Cardiac:  Irreg ireg  S1, S2; RRR; no murmur  Lungs:  clear to auscultation bilaterally, no wheezing, rhonchi or rales  Abd: soft, nontender, no hepatomegaly  Ext: no edema Musculoskeletal:  No deformities, BUE and BLE strength normal and equal Skin: warm and dry  Neuro:  CNs 2-12 intact, no focal abnormalities noted Psych:  Normal affect   EKG:  The EKG was personally reviewed and demonstrates:  05/26/20  Atrial flutter with 2:1 conduction   139 bpm   Telemetry:  Telemetry was personally reviewed and demonstrates:  Afib  Rates 80s to 120s   Plus 1 13 beat run NSVT  Relevant CV Studies: None  Laboratory Data:  High Sensitivity Troponin:   Recent Labs  Lab 05/26/20 2024 05/26/20 2230  TROPONINIHS 54* 54*     Chemistry Recent Labs  Lab 05/27/20 0622 05/28/20 0654 05/29/20 0039 05/29/20 1338  NA 146* 146* 147*  --   K 3.4* 3.7 3.3* 3.0*  CL 109 112* 112*  --   CO2 25 28 29   --   GLUCOSE 122* 88 148*  --   BUN 42* 31* 23  --   CREATININE 0.90 0.96 0.79  --   CALCIUM 9.4 9.4 9.3  --   GFRNONAA >60 >60 >60  --   ANIONGAP 12 6 6   --     Recent Labs  Lab 05/26/20 2024  PROT 9.2*  ALBUMIN 3.0*  AST 33  ALT 24  ALKPHOS 53  BILITOT 1.3*   Hematology Recent Labs  Lab 05/27/20 0622 05/28/20 0654 05/29/20 0039  WBC 4.7 9.1 12.2*  RBC 4.07*  3.42* 3.51*  HGB 12.7* 10.8* 10.9*  HCT 42.4 35.4* 35.4*  MCV 104.2* 103.5* 100.9*  MCH 31.2 31.6 31.1  MCHC 30.0 30.5 30.8  RDW 12.5 12.6 12.5  PLT 178 135* 149*  BNP Recent Labs  Lab 05/26/20 2024  BNP 256.9*    DDimer No results for input(s): DDIMER in the last 168 hours.   Radiology/Studies:  DG Chest 2 View  Result Date: 05/26/2020 CLINICAL DATA:  We, altered mental status. Abdominal pain. Decreased appetite and mobility. EXAM: CHEST - 2 VIEW COMPARISON:  Chest x-ray 04/20/2020 FINDINGS: The heart size and mediastinal contours are unchanged. Aortic arch calcification. No focal consolidation. No pulmonary edema. No pleural effusion. No pneumothorax. No acute osseous abnormality. IMPRESSION: No active cardiopulmonary disease. Electronically Signed   By: Iven Finn M.D.   On: 05/26/2020 20:09   DG Abd 1 View  Result Date: 05/27/2020 CLINICAL DATA:  NG tube placement EXAM: ABDOMEN - 1 VIEW COMPARISON:  None. FINDINGS: Mildly dilated air-filled loop of probable small bowel seen within the mid abdomen. Tip the NG tube is seen just entering the stomach with the side hole in the distal esophagus. No radio-opaque calculi or other significant radiographic abnormality are seen. IMPRESSION: Tip the NG tube just entering the proximal stomach and could be advanced several cm. Electronically Signed   By: Prudencio Pair M.D.   On: 05/27/2020 03:24   CT ABDOMEN PELVIS W CONTRAST  Result Date: 05/26/2020 CLINICAL DATA:  Abdominal distension, abdominal pain. Decreased appetite and mobility EXAM: CT ABDOMEN AND PELVIS WITH CONTRAST TECHNIQUE: Multidetector CT imaging of the abdomen and pelvis was performed using the standard protocol following bolus administration of intravenous contrast. CONTRAST:  110mL OMNIPAQUE IOHEXOL 300 MG/ML  SOLN COMPARISON:  CT abdomen pelvis 10/31/2017. FINDINGS: Lower chest: Elevated left hemidiaphragm with compressive changes at the left base. Fluid noted within the  esophageal lumen. Coronary artery calcifications. Hepatobiliary: Redemonstration of an ill-defined hypodense right hepatic lesion measuring 3.2 x 2.6 cm (from 3.9 x 3.3 cm). Persistent calcific density along the peripheral right hepatic dome the could represent sequela of prior granulomatous disease. No gallstones, gallbladder wall thickening, or pericholecystic fluid. No biliary dilatation. Pancreas: No focal lesion. Normal pancreatic contour. No surrounding inflammatory changes. No main pancreatic ductal dilatation. Spleen: Normal in size without focal abnormality. Adrenals/Urinary Tract: No adrenal nodule bilaterally. Bilateral kidneys enhance symmetrically. No hydronephrosis. No hydroureter. On delayed imaging, there is no urothelial wall thickening and there are no filling defects in the opacified portions of the bilateral collecting systems or ureters. The urinary bladder is unremarkable. Stomach/Bowel: The gastric lumen is dilated with fluid. Markedly proximal and mid dilated small bowel loops with a transition point at the entrance of the right inguinal hernia (11:84, 5:40). The short segment of small bowel within the right inguinal hernia also is noted to be distended with an air-fluid level within other likely transition point at the exit of the small bowel. The small bowel distally is completely collapsed. Stool within the colon. No small or large bowel wall thickening. No large bowel dilatation. No definite pneumatosis. Vascular/Lymphatic: No abdominal aorta or iliac aneurysm. Severe atherosclerotic plaque of the aorta and its branches. No abdominal, pelvic, or inguinal lymphadenopathy. Reproductive: Multiple round metallic densities within and surrounding the prostate gland consistent with radioactive seeds. Prostate is enlarged measuring up to at least 5 cm. Other: No intraperitoneal free fluid. No intraperitoneal free gas. No organized fluid collection. Musculoskeletal: Severe soft tissue edema.  Redemonstration of multiple fat density lesion within superficial anterior abdominal subcutaneus soft tissues that likely represent lipomatous lesions. The largest measures 1.5 x 0.9 cm (11:23). Redemonstration (new from 10/31/2017, retrospectively visualized on 04/20/2020) of a lytic lesion within the L2 vertebral  body with an associated pathologic fracture of the superior endplate (5: 68, 24:23). Otherwise no new acute displaced fracture. Old healed left rib fractures. Multilevel degenerative changes of the spine. IMPRESSION: 1. High-grade small bowel obstruction with a transition point at the entrance of a right inguinal hernia. No bowel perforation. Recommend surgical consultation. 2. Additional closed-loop obstruction of a short loop of small bowel contained within the right inguinal hernia suggesting possible incarceration. Recommend surgical consultation. 3. Fluid within the esophageal lumen with a distended gastric lumen. Recommend enteric tube placement. 4. Indeterminate L2 lytic lesion with associated pathologic superior endplate fracture. 5. Indeterminate 3.2 cm ill-defined hypodense lesion within the right hepatic lobe. Consider MRI liver protocol for further evaluation. 6. Prostatomegaly with associated radioactive seeds. 7. Other imaging findings of potential clinical significance: Multiple fat density lesions within superficial anterior abdominal subcutaneus soft tissues that likely represent lipomatous lesions. Aortic Atherosclerosis (ICD10-I70.0). These results were called by telephone at the time of interpretation on 05/26/2020 at 10:30 pm to provider [facility] , who verbally acknowledged these results. Electronically Signed   By: Iven Finn M.D.   On: 05/26/2020 22:45   DG Chest Port 1 View  Result Date: 05/27/2020 CLINICAL DATA:  Intubation. EXAM: PORTABLE CHEST 1 VIEW COMPARISON:  05/26/2020 FINDINGS: The ET tube is 4.9 cm above the carina. The NG tube is coursing down the esophagus  and into the stomach. The heart is enlarged but appears stable. Stable tortuosity and calcification of the thoracic aorta. No infiltrates, effusions or edema. Streaky bibasilar atelectasis. IMPRESSION: 1. ET tube and NG tubes in good position. 2. Streaky bibasilar atelectasis. Electronically Signed   By: Marijo Sanes M.D.   On: 05/27/2020 05:41     Assessment and Plan:   1  Atrial fibrillation/ flutter  CHADSVASc is 6(age, CAD, HTN, TIA)   Pt with variable rates   Tolerating hemodynamically  Would recomm adding lopressor IV and continue diltiazem for better rate control   Anticoagulate when OK from a surgical standpoint   Echo when HR improves    2  CAD Pt with remote MI/PCI  He does not remember   He denies CP   Would follow   3  HL  Has refused Rx in the past  4  Hx bradycardia.   Follow HR closely with use oof Dilt and B Blocker     For questions or updates, please contact [place] [facility] Please consult [email or web address] for contact info under    Signed, Dorris Carnes, MD  05/29/2020 3:06 PM

## 2020-05-30 DIAGNOSIS — E876 Hypokalemia: Secondary | ICD-10-CM

## 2020-05-30 LAB — GLUCOSE, CAPILLARY
Glucose-Capillary: 103 mg/dL — ABNORMAL HIGH (ref 70–99)
Glucose-Capillary: 104 mg/dL — ABNORMAL HIGH (ref 70–99)
Glucose-Capillary: 106 mg/dL — ABNORMAL HIGH (ref 70–99)
Glucose-Capillary: 109 mg/dL — ABNORMAL HIGH (ref 70–99)
Glucose-Capillary: 114 mg/dL — ABNORMAL HIGH (ref 70–99)
Glucose-Capillary: 122 mg/dL — ABNORMAL HIGH (ref 70–99)
Glucose-Capillary: 123 mg/dL — ABNORMAL HIGH (ref 70–99)
Glucose-Capillary: 133 mg/dL — ABNORMAL HIGH (ref 70–99)

## 2020-05-30 LAB — BASIC METABOLIC PANEL
Anion gap: 7 (ref 5–15)
BUN: 14 mg/dL (ref 8–23)
CO2: 28 mmol/L (ref 22–32)
Calcium: 8.8 mg/dL — ABNORMAL LOW (ref 8.9–10.3)
Chloride: 110 mmol/L (ref 98–111)
Creatinine, Ser: 0.75 mg/dL (ref 0.61–1.24)
GFR, Estimated: >60 mL/min (ref >60)
Glucose, Bld: 129 mg/dL — ABNORMAL HIGH (ref 70–99)
Potassium: 3.1 mmol/L — ABNORMAL LOW (ref 3.5–5.1)
Sodium: 145 mmol/L (ref 135–145)

## 2020-05-30 LAB — POTASSIUM: Potassium: 3.9 mmol/L (ref 3.5–5.1)

## 2020-05-30 LAB — CBC
HCT: 32.6 % — ABNORMAL LOW (ref 39.0–52.0)
Hemoglobin: 10.3 g/dL — ABNORMAL LOW (ref 13.0–17.0)
MCH: 31.2 pg (ref 26.0–34.0)
MCHC: 31.6 g/dL (ref 30.0–36.0)
MCV: 98.8 fL (ref 80.0–100.0)
Platelets: 149 10*3/uL — ABNORMAL LOW (ref 150–400)
RBC: 3.3 MIL/uL — ABNORMAL LOW (ref 4.22–5.81)
RDW: 12.6 % (ref 11.5–15.5)
WBC: 8.9 10*3/uL (ref 4.0–10.5)
nRBC: 0 % (ref 0.0–0.2)

## 2020-05-30 LAB — MAGNESIUM: Magnesium: 2 mg/dL (ref 1.7–2.4)

## 2020-05-30 LAB — PHOSPHORUS
Phosphorus: 2.1 mg/dL — ABNORMAL LOW (ref 2.5–4.6)
Phosphorus: 2.5 mg/dL (ref 2.5–4.6)

## 2020-05-30 MED ORDER — ORAL CARE MOUTH RINSE
15.0000 mL | Freq: Two times a day (BID) | OROMUCOSAL | Status: DC
  Administered 2020-05-30 – 2020-06-12 (×21): 15 mL via OROMUCOSAL

## 2020-05-30 MED ORDER — POTASSIUM PHOSPHATES 15 MMOLE/5ML IV SOLN
30.0000 mmol | Freq: Once | INTRAVENOUS | Status: AC
  Administered 2020-05-30: 30 mmol via INTRAVENOUS
  Filled 2020-05-30: qty 10

## 2020-05-30 NOTE — Progress Notes (Signed)
Report called to Bethena Roys, Therapist, sports.  Pt will transport on PCU bed. VSS on RA, belongings chart and meds sent with him.

## 2020-05-30 NOTE — Progress Notes (Signed)
Progress Note  Patient Name: Craig [name] Date of Encounter: 05/30/2020  Primary Cardiologist: No primary care provider on file.   Subjective   Remains somnolent. Does not answer questions, withdraws to noxious stimuli.  Inpatient Medications    Scheduled Meds: . Chlorhexidine Gluconate Cloth  6 each Topical Daily  . enoxaparin (LOVENOX) injection  40 mg Subcutaneous Q24H  . metoprolol tartrate  5 mg Intravenous Q6H  . pantoprazole (PROTONIX) IV  40 mg Intravenous Daily   Continuous Infusions: . sodium chloride Stopped (05/27/20 1207)  . acetaminophen Stopped (05/30/20 0545)  . dextrose 5% lactated ringers 125 mL/hr at 05/30/20 0619  . diltiazem (CARDIZEM) infusion 5 mg/hr (05/30/20 1497)  . meropenem (MERREM) IV 1 g (05/30/20 0263)  . potassium PHOSPHATE IVPB (in mmol)     PRN Meds: acetaminophen **OR** acetaminophen, LORazepam **OR** LORazepam, ondansetron **OR** ondansetron (ZOFRAN) IV   Vital Signs    Vitals:   05/30/20 0300 05/30/20 0400 05/30/20 0500 05/30/20 0600  BP: (!) 171/94 (!) 161/83 (!) 166/91 (!) 142/91  Pulse: 63 64 64 63  Resp: 19 17 18 16   Temp:      TempSrc:      SpO2: 100% 100% 100% 96%  Weight:        Intake/Output Summary (Last 24 hours) at 05/30/2020 0906 Last data filed at 05/30/2020 7858 Gross per 24 hour  Intake 3624.31 ml  Output 1550 ml  Net 2074.31 ml   Filed Weights   05/27/20 0508 05/28/20 0500  Weight: 70.9 kg 74.3 kg    Telemetry    Atrial fib/flutter, controlled VR - Personally Reviewed  ECG    none - Personally Reviewed  Physical Exam   GEN: diskempt, no acute distress.   Neck: No JVD Cardiac: IRRR, no murmurs, rubs, or gallops.  Respiratory: Clear to auscultation bilaterally. GI: Soft, nontender, non-distended  MS: No edema; warm, No deformity. Neuro:  Nonfocal  Psych: Normal affect   Labs    Chemistry Recent Labs  Lab 05/26/20 2024 05/27/20 0622 05/28/20 0654 05/29/20 0039 05/29/20 1338  05/30/20 0620  NA 149*   < > 146* 147*  --  145  K 3.2*   < > 3.7 3.3* 3.0* 3.1*  CL 105   < > 112* 112*  --  110  CO2 31   < > 28 29  --  28  GLUCOSE 123*   < > 88 148*  --  129*  BUN 42*   < > 31* 23  --  14  CREATININE 0.89   < > 0.96 0.79  --  0.75  CALCIUM 10.0   < > 9.4 9.3  --  8.8*  PROT 9.2*  --   --   --   --   --   ALBUMIN 3.0*  --   --   --   --   --   AST 33  --   --   --   --   --   ALT 24  --   --   --   --   --   ALKPHOS 53  --   --   --   --   --   BILITOT 1.3*  --   --   --   --   --   GFRNONAA >60   < > >60 >60  --  >60  ANIONGAP 13   < > 6 6  --  7   < > = values  in this interval not displayed.     Hematology Recent Labs  Lab 05/28/20 0654 05/29/20 0039 05/30/20 0620  WBC 9.1 12.2* 8.9  RBC 3.42* 3.51* 3.30*  HGB 10.8* 10.9* 10.3*  HCT 35.4* 35.4* 32.6*  MCV 103.5* 100.9* 98.8  MCH 31.6 31.1 31.2  MCHC 30.5 30.8 31.6  RDW 12.6 12.5 12.6  PLT 135* 149* 149*    Cardiac EnzymesNo results for input(s): TROPONINI in the last 168 hours. No results for input(s): TROPIPOC in the last 168 hours.   BNP Recent Labs  Lab 05/26/20 2024  BNP 256.9*     DDimer No results for input(s): DDIMER in the last 168 hours.   Radiology    No results found.  Cardiac Studies   none  Patient Profile     79 y.o. male with multiple medical problems admitted with small bowel obstruction, due to incarcerated hernia, s/p surgery  Assessment & Plan    1. Atrial fib - his rates are much improved today. Continue diltiazem and prn metoprolol. 2. Hypokalemia - he is persistently hypokalemic. He will need additional repletion.  3. CAD - s/p remote MI. No evidence of ongoing ischemia.  4. Tachy-brady - his rates are currently controlled though he has a h/o tachy and brady. We will follow with you.      For questions or updates, please contact [place] Please consult [email or web address] for contact info under Cardiology/STEMI.      Signed, Cristopher Peru, MD   05/30/2020, 9:06 AM  Patient ID: Craig [name], male   DOB: 27-Apr-1941, 79 y.o.   MRN: [id number]

## 2020-05-30 NOTE — Progress Notes (Signed)
[facility] Hospital Day(s): 3.   Post op day(s): 3 Days Post-Op.   Interval History: Patient seen and examined, no acute events or new complaints overnight. Patient reports feeling well.  Patient was woken up this morning.  He was very comfortable without any distress.  He denies any nausea or vomiting.  History is unreliable due to patient baseline mental status.  Sitter at bedside denies any issues.  As per discussion with nurse there is no evidence of bowel movement or gas.  Vital signs in last 24 hours: [min-max] current  Temp:  [97 F (36.1 C)-97.9 F (36.6 C)] 97.1 F (36.2 C) (12/12 0000) Pulse Rate:  [60-150] 63 (12/12 0600) Resp:  [15-37] 16 (12/12 0600) BP: (133-171)/(62-126) 142/91 (12/12 0600) SpO2:  [96 %-100 %] 96 % (12/12 0600)       Weight: 74.3 kg BMI (Calculated): 21.02   Physical Exam:  Constitutional: alert, cooperative and no distress  Respiratory: breathing non-labored at rest  Cardiovascular: regular rate and sinus rhythm  Gastrointestinal: soft, non-tender, and non-distended.  Serosanguineous oozing from right groin wound.  No erythema.  No purulence.  Labs:  CBC Latest Ref Rng & Units 05/30/2020 05/29/2020 05/28/2020  WBC 4.0 - 10.5 K/uL 8.9 12.2(H) 9.1  Hemoglobin 13.0 - 17.0 g/dL 10.3(L) 10.9(L) 10.8(L)  Hematocrit 39.0 - 52.0 % 32.6(L) 35.4(L) 35.4(L)  Platelets 150 - 400 K/uL 149(L) 149(L) 135(L)   CMP Latest Ref Rng & Units 05/30/2020 05/29/2020 05/29/2020  Glucose 70 - 99 mg/dL 129(H) - 148(H)  BUN 8 - 23 mg/dL 14 - 23  Creatinine 0.61 - 1.24 mg/dL 0.75 - 0.79  Sodium 135 - 145 mmol/L 145 - 147(H)  Potassium 3.5 - 5.1 mmol/L 3.1(L) 3.0(L) 3.3(L)  Chloride 98 - 111 mmol/L 110 - 112(H)  CO2 22 - 32 mmol/L 28 - 29  Calcium 8.9 - 10.3 mg/dL 8.8(L) - 9.3  Total Protein 6.5 - 8.1 g/dL - - -  Total Bilirubin 0.3 - 1.2 mg/dL - - -  Alkaline Phos 38 - 126 U/L - - -  AST 15 - 41 U/L - - -  ALT 0 - 44 U/L - - -    Imaging studies:  No new pertinent imaging studies   Assessment/Plan:  79 y.o. male with strangulated inguinal hernia 3 Days Post-Op s/p small bowel resection and anastomosis and orchiectomy, complicated by pertinent comorbidities including coronary artery disease, hypertension, history of TIA.  Patient without any clinical deterioration.  Adequate white blood cell count.  No fever.  Physical exam of the abdomen not concerning for tenderness.  I will still keep patient n.p.o. since there is no evidence of bowel function.  We will be important to avoid any increased risk of aspiration in this patient.  As soon as there is any evidence of improved bowel function including bowel movement or evidence of passing gas it will be reasonable to start clear liquid.  The patient can get out of bed to chair will be ideal.  We will continue to follow closely.  Appreciate hospitalist management of medical comorbidities.  Arnold Long, MD

## 2020-05-30 NOTE — Progress Notes (Addendum)
PROGRESS NOTE    Craig [name]  MOQ:[id number] DOB: 1940/08/22 DOA: 05/26/2020 PCP: [name], MD    Brief Narrative:  Craig [name] is a 79 y.o. male with 2 to 3-day history of progressive abdominal pain distention. Found with High-grade small bowel obstruction with a transition point at the entrance of a right inguinal hernia. No bowel perforation. Recommend surgical consultation.  Patient went to surgery for incarcerated right inguinal hernia and small bowel obstruction on 12/9.  Was transferred to ICU.  Was extubated on 12/9. PCCM pickup on 12/11.   Pt pulled out NGT. Currently NPO. Also now in afib rvr started on dilt gtt.   12/12- Calmer today. HR better on cardizem gtt. Still NPO  Consultants:   Pccm, surgery, cardiology  Procedures:   Antimicrobials:   Meropenem   Subjective: Calmer, still confused. Denies pain, sob, cp   Objective: Vitals:   05/30/20 1200 05/30/20 1300 05/30/20 1400 05/30/20 1501  BP:  (!) 155/83 (!) 167/79 (!) 160/90  Pulse:  63 64 64  Resp:  (!) 21 17 19   Temp: 97.7 F (36.5 C)   97.6 F (36.4 C)  TempSrc: Oral   Oral  SpO2:  100% 100% 100%  Weight:        Intake/Output Summary (Last 24 hours) at 05/30/2020 1531 Last data filed at 05/30/2020 1200 Gross per 24 hour  Intake 3942.12 ml  Output 3150 ml  Net 792.12 ml   Filed Weights   05/27/20 0508 05/28/20 0500  Weight: 70.9 kg 74.3 kg    Examination: Calmer, NAD CTA with poor respiratory effort, no wheezing Regular S1-S2 no gallops Soft benign decreased bowel sounds No edema Awake alert, oriented to person only Mood and affect appropriate in current setting     Data Reviewed: I have personally reviewed following labs and imaging studies  CBC: Recent Labs  Lab 05/26/20 2024 05/27/20 0622 05/28/20 0654 05/29/20 0039 05/30/20 0620  WBC 9.4 4.7 9.1 12.2* 8.9  NEUTROABS 8.1*  --   --   --   --   HGB 14.6 12.7* 10.8* 10.9* 10.3*  HCT 47.5 42.4  35.4* 35.4* 32.6*  MCV 100.6* 104.2* 103.5* 100.9* 98.8  PLT 269 178 135* 149* 354*   Basic Metabolic Panel: Recent Labs  Lab 05/26/20 2024 05/27/20 0622 05/28/20 0654 05/29/20 0039 05/29/20 1338 05/30/20 0620  NA 149* 146* 146* 147*  --  145  K 3.2* 3.4* 3.7 3.3* 3.0* 3.1*  CL 105 109 112* 112*  --  110  CO2 31 25 28 29   --  28  GLUCOSE 123* 122* 88 148*  --  129*  BUN 42* 42* 31* 23  --  14  CREATININE 0.89 0.90 0.96 0.79  --  0.75  CALCIUM 10.0 9.4 9.4 9.3  --  8.8*  MG  --  2.2 2.0 1.9 2.1 2.0  PHOS  --  2.8 1.2* <1.0* 1.2* 2.1*   GFR: Estimated Creatinine Clearance: 81.3 mL/min (by C-G formula based on SCr of 0.75 mg/dL). Liver Function Tests: Recent Labs  Lab 05/26/20 2024  AST 33  ALT 24  ALKPHOS 53  BILITOT 1.3*  PROT 9.2*  ALBUMIN 3.0*   Recent Labs  Lab 05/26/20 2024  LIPASE 42   No results for input(s): AMMONIA in the last 168 hours. Coagulation Profile: Recent Labs  Lab 05/26/20 2230  INR 1.3*   Cardiac Enzymes: No results for input(s): CKTOTAL, CKMB, CKMBINDEX, TROPONINI in the last 168 hours. BNP (  last 3 results) No results for input(s): PROBNP in the last 8760 hours. HbA1C: No results for input(s): HGBA1C in the last 72 hours. CBG: Recent Labs  Lab 05/29/20 1635 05/29/20 1959 05/30/20 0338 05/30/20 0745 05/30/20 1139  GLUCAP 125* 152* 122* 106* 133*   Lipid Profile: No results for input(s): CHOL, HDL, LDLCALC, TRIG, CHOLHDL, LDLDIRECT in the last 72 hours. Thyroid Function Tests: No results for input(s): TSH, T4TOTAL, FREET4, T3FREE, THYROIDAB in the last 72 hours. Anemia Panel: No results for input(s): VITAMINB12, FOLATE, FERRITIN, TIBC, IRON, RETICCTPCT in the last 72 hours. Sepsis Labs: Recent Labs  Lab 05/26/20 2024 05/26/20 2230 05/27/20 0622 05/28/20 0654 05/29/20 0039  PROCALCITON  --   --  0.25 2.58 1.62  LATICACIDVEN 3.0* 1.8  --   --   --     Recent Results (from the past 240 hour(s))  Blood culture (routine  single)     Status: None (Preliminary result)   Collection Time: 05/26/20 10:30 PM   Specimen: BLOOD  Result Value Ref Range Status   Specimen Description BLOOD RIGHT ANTECUBITAL  Final   Special Requests   Final    BOTTLES DRAWN AEROBIC AND ANAEROBIC Blood Culture results may not be optimal due to an inadequate volume of blood received in culture bottles   Culture   Final    NO GROWTH 4 DAYS Performed at [facility], [address] [place], [place] [address]    Report Status PENDING  Incomplete  Resp Panel by RT-PCR (Flu A&B, Covid) Nasopharyngeal Swab     Status: None   Collection Time: 05/27/20 12:37 AM   Specimen: Nasopharyngeal Swab; Nasopharyngeal(NP) swabs in vial transport medium  Result Value Ref Range Status   SARS Coronavirus 2 by RT PCR NEGATIVE NEGATIVE Final    Comment: (NOTE) SARS-CoV-2 target nucleic acids are NOT DETECTED.  The SARS-CoV-2 RNA is generally detectable in upper respiratory specimens during the acute phase of infection. The lowest concentration of SARS-CoV-2 viral copies this assay can detect is 138 copies/mL. A negative result does not preclude SARS-Cov-2 infection and should not be used as the sole basis for treatment or other patient management decisions. A negative result may occur with  improper specimen collection/handling, submission of specimen other than nasopharyngeal swab, presence of viral mutation(s) within the areas targeted by this assay, and inadequate number of viral copies(<138 copies/mL). A negative result must be combined with clinical observations, patient history, and epidemiological information. The expected result is Negative.  Fact Sheet for Patients:  EntrepreneurPulse.com.au  Fact Sheet for Healthcare Providers:  IncredibleEmployment.be  This test is no t yet approved or cleared by the [place] FDA and  has been authorized for detection and/or diagnosis of SARS-CoV-2  by FDA under an Emergency Use Authorization (EUA). This EUA will remain  in effect (meaning this test can be used) for the duration of the COVID-19 declaration under Section 564(b)(1) of the Act, 21 U.S.C.section 360bbb-3(b)(1), unless the authorization is terminated  or revoked sooner.       Influenza A by PCR NEGATIVE NEGATIVE Final   Influenza B by PCR NEGATIVE NEGATIVE Final    Comment: (NOTE) The Xpert Xpress SARS-CoV-2/FLU/RSV plus assay is intended as an aid in the diagnosis of influenza from Nasopharyngeal swab specimens and should not be used as a sole basis for treatment. Nasal washings and aspirates are unacceptable for Xpert Xpress SARS-CoV-2/FLU/RSV testing.  Fact Sheet for Patients: EntrepreneurPulse.com.au  Fact Sheet for Healthcare Providers: IncredibleEmployment.be  This test  is not yet approved or cleared by the [place] and has been authorized for detection and/or diagnosis of SARS-CoV-2 by FDA under an Emergency Use Authorization (EUA). This EUA will remain in effect (meaning this test can be used) for the duration of the COVID-19 declaration under Section 564(b)(1) of the Act, 21 U.S.C. section 360bbb-3(b)(1), unless the authorization is terminated or revoked.  Performed at [facility], [address] [place], [place] [address]   MRSA PCR Screening     Status: None   Collection Time: 05/27/20  5:00 AM   Specimen: Nasopharyngeal  Result Value Ref Range Status   MRSA by PCR NEGATIVE NEGATIVE Final    Comment:        The GeneXpert MRSA Assay (FDA approved for NASAL specimens only), is one component of a comprehensive MRSA colonization surveillance program. It is not intended to diagnose MRSA infection nor to guide or monitor treatment for MRSA infections. Performed at [facility], [address], [place], [place] [address]   Urine culture     Status: None   Collection Time:  05/27/20  5:43 AM   Specimen: In/Out Cath Urine  Result Value Ref Range Status   Specimen Description   Final    IN/OUT CATH URINE Performed at [facility], [address] [place], [place] [address]    Special Requests   Final    NONE Performed at [facility], [address], [place], [place] [address]    Culture   Final    NO GROWTH Performed at [facility], [place] [address] [place],  [address]    Report Status 05/28/2020 FINAL  Final         Radiology Studies: No results found.      Scheduled Meds: . Chlorhexidine Gluconate Cloth  6 each Topical Daily  . enoxaparin (LOVENOX) injection  40 mg Subcutaneous Q24H  . mouth rinse  15 mL Mouth Rinse BID  . metoprolol tartrate  5 mg Intravenous Q6H  . pantoprazole (PROTONIX) IV  40 mg Intravenous Daily   Continuous Infusions: . sodium chloride Stopped (05/27/20 1207)  . acetaminophen 1,000 mg (05/30/20 1205)  . dextrose 5% lactated ringers 125 mL/hr at 05/30/20 0800  . diltiazem (CARDIZEM) infusion 5 mg/hr (05/30/20 0800)  . meropenem (MERREM) IV 1 g (05/30/20 1425)  . potassium PHOSPHATE IVPB (in mmol) 30 mmol (05/30/20 0950)    Assessment & Plan:   Principal Problem:   SBO (small bowel obstruction) (HCC) Active Problems:   CAD (coronary artery disease), native coronary artery   History of coronary artery stent placement   Essential hypertension   Dementia with behavioral disturbance (HCC)   Atrial flutter with rapid ventricular response (HCC)   Hypernatremia   Nicotine dependence   1.Incarcerated right inguinal hernia with strangulated(gangrenous)knuckle of small bowelwith perforation, small bowel obstruction and right spermatic cord abscess. Primary management per surgical team  Continue IV meropenem NG tube was pulled out by patient, continue n.p.o. Per surgery there is no evidence of bowel function will need to monitor      2.  AFib with RVR- Rate better,  continue Cardizem drip  Cardiology following  Need to assess question candidate for full anticoagulation  Anticoagulants to be cleared by surgical standpoint  Echo when heart rate better  Continue IV Lopressor     3.Acute metabolic encephalopathy superimposed on dementia with behavioral disturbance ([place]) Calmer today . Ativan as needed  Continue to monitor with one-on-one  sitter   4. Elevated troponin -likely demand ischemia secondary to small bowel obstruction  Hx: HTN, HLD, CAD, and TIA Cardiology following, echo with heart rate improves  Currently n.p.o.  When able to can resume p.o. home medication     5.Dementia- On Donepezil, held since pt NPO.   DVT prophylaxis: Lovenox Code Status: Full Family Communication: called daughter but went to VM  Status is: Inpatient  Remains inpatient appropriate because:Inpatient level of care appropriate due to severity of illness   Dispo: The patient is from: Home              Anticipated d/c is to: TBD              Anticipated d/c date is: 3 days              Patient currently is not medically stable to d/c.            LOS: 3 days   Time spent: 35 min with >50% on coc    Nolberto Hanlon, MD [facility] Pager [phone]  If 7PM-7AM, please contact night-coverage 05/30/2020, 3:31 PM

## 2020-05-30 NOTE — Consult Note (Addendum)
[place] for Electrolyte Monitoring and Replacement   Recent Labs: Potassium (mmol/L)  Date Value  05/30/2020 3.9   Magnesium (mg/dL)  Date Value  05/30/2020 2.0   Calcium (mg/dL)  Date Value  05/30/2020 8.8 (L)   Albumin (g/dL)  Date Value  05/26/2020 3.0 (L)   Phosphorus (mg/dL)  Date Value  05/30/2020 2.5   Sodium (mmol/L)  Date Value  05/30/2020 145     Assessment: Patient is a 79 y/o M with medical history including CAD s/p CABG in the 1990s, Aflutter, HTN, history of TIA who presented to the ED 12/8 with abdominal pain. Patient subsequently found to have incarcerated right inguinal hernia with strangulated (gangrenous) knuckle of small bowel with perforation, SBO, and right spermatic cord abscess. He is POD # 1 from exploratory laparotomy, right inguinal hernia repair, small bowel resection, and orchiectomy. Patient is currently NPO. No nutritional support at this time. Pharmacy consulted to assist with electrolyte monitoring and replacement as indicated.  Potassium and pho Goal of Therapy:  K 4 Mg 2 All other electrolytes within normal limits  Plan:  -- No additional electrolyte replacement needed at this time. --Follow-up electrolytes tomorrow AM  Rowland Lathe 05/30/2020 8:25 PM

## 2020-05-31 ENCOUNTER — Inpatient Hospital Stay: Payer: Medicare (Managed Care)

## 2020-05-31 LAB — GLUCOSE, CAPILLARY
Glucose-Capillary: 112 mg/dL — ABNORMAL HIGH (ref 70–99)
Glucose-Capillary: 98 mg/dL (ref 70–99)
Glucose-Capillary: 111 mg/dL — ABNORMAL HIGH (ref 70–99)
Glucose-Capillary: 128 mg/dL — ABNORMAL HIGH (ref 70–99)
Glucose-Capillary: 119 mg/dL — ABNORMAL HIGH (ref 70–99)

## 2020-05-31 LAB — CBC
HCT: 38.5 % — ABNORMAL LOW (ref 39.0–52.0)
Hemoglobin: 12 g/dL — ABNORMAL LOW (ref 13.0–17.0)
MCH: 30.7 pg (ref 26.0–34.0)
MCHC: 31.2 g/dL (ref 30.0–36.0)
MCV: 98.5 fL (ref 80.0–100.0)
Platelets: 147 10*3/uL — ABNORMAL LOW (ref 150–400)
RBC: 3.91 MIL/uL — ABNORMAL LOW (ref 4.22–5.81)
RDW: 12.4 % (ref 11.5–15.5)
WBC: 7.5 10*3/uL (ref 4.0–10.5)
nRBC: 0.3 % — ABNORMAL HIGH (ref 0.0–0.2)

## 2020-05-31 LAB — CULTURE, BLOOD (SINGLE): Culture: NO GROWTH

## 2020-05-31 LAB — PHOSPHORUS: Phosphorus: 1.4 mg/dL — ABNORMAL LOW (ref 2.5–4.6)

## 2020-05-31 LAB — MAGNESIUM: Magnesium: 1.8 mg/dL (ref 1.7–2.4)

## 2020-05-31 LAB — BASIC METABOLIC PANEL
Anion gap: 7 (ref 5–15)
BUN: 10 mg/dL (ref 8–23)
CO2: 28 mmol/L (ref 22–32)
Calcium: 8.5 mg/dL — ABNORMAL LOW (ref 8.9–10.3)
Chloride: 104 mmol/L (ref 98–111)
Creatinine, Ser: 0.75 mg/dL (ref 0.61–1.24)
GFR, Estimated: >60 mL/min (ref >60)
Glucose, Bld: 116 mg/dL — ABNORMAL HIGH (ref 70–99)
Potassium: 3.3 mmol/L — ABNORMAL LOW (ref 3.5–5.1)
Sodium: 139 mmol/L (ref 135–145)

## 2020-05-31 MED ORDER — POTASSIUM PHOSPHATES 15 MMOLE/5ML IV SOLN
45.0000 mmol | Freq: Once | INTRAVENOUS | Status: AC
  Administered 2020-05-31: 45 mmol via INTRAVENOUS
  Filled 2020-05-31: qty 15

## 2020-05-31 MED ORDER — MAGNESIUM SULFATE 2 GM/50ML IV SOLN
2.0000 g | Freq: Once | INTRAVENOUS | Status: AC
  Administered 2020-05-31: 2 g via INTRAVENOUS
  Filled 2020-05-31: qty 50

## 2020-05-31 MED ORDER — FLEET ENEMA 7-19 GM/118ML RE ENEM: 1.0000 | ENEMA | Freq: Every day | RECTAL | Status: DC | PRN

## 2020-05-31 MED ORDER — BISACODYL 10 MG RE SUPP
10.0000 mg | Freq: Every morning | RECTAL | Status: DC
  Administered 2020-05-31 – 2020-06-09 (×8): 10 mg via RECTAL
  Filled 2020-05-31 (×8): qty 1

## 2020-05-31 NOTE — Progress Notes (Signed)
RN at bedside with tech doing linen change. Patient bleeding from right groin surgical site. ABD applied. Site intact, clean. Will continue to monitor site.

## 2020-05-31 NOTE — Plan of Care (Signed)

## 2020-05-31 NOTE — Progress Notes (Signed)
Progress Note  Patient Name: Craig [name] Date of Encounter: 05/31/2020  Primary Cardiologist: Formerly Dr. Clayborn Bigness - Lovena Le  Subjective   Sitter at bedside. Not able to obtain history secondary to confusion.  Inpatient Medications    Scheduled Meds: . bisacodyl  10 mg Rectal q morning - 10a  . Chlorhexidine Gluconate Cloth  6 each Topical Daily  . enoxaparin (LOVENOX) injection  40 mg Subcutaneous Q24H  . mouth rinse  15 mL Mouth Rinse BID  . metoprolol tartrate  5 mg Intravenous Q6H  . pantoprazole (PROTONIX) IV  40 mg Intravenous Daily   Continuous Infusions: . sodium chloride Stopped (05/27/20 1207)  . dextrose 5% lactated ringers 125 mL/hr at 05/31/20 0557  . diltiazem (CARDIZEM) infusion Stopped (05/31/20 0830)  . meropenem (MERREM) IV 1 g (05/31/20 0448)  . potassium PHOSPHATE IVPB (in mmol) 45 mmol (05/31/20 1233)   PRN Meds: acetaminophen **OR** acetaminophen, LORazepam **OR** LORazepam, ondansetron **OR** ondansetron (ZOFRAN) IV, sodium phosphate   Vital Signs    Vitals:   05/31/20 0744 05/31/20 0746 05/31/20 1125 05/31/20 1200  BP: (!) 176/105 (!) 172/123 (!) 165/90   Pulse: 81 97 (!) 56   Resp: 18  18 17   Temp: 97.9 F (36.6 C)  97.9 F (36.6 C)   TempSrc:      SpO2: 94%  100%   Weight:        Intake/Output Summary (Last 24 hours) at 05/31/2020 1317 Last data filed at 05/31/2020 1027 Gross per 24 hour  Intake 1239.92 ml  Output 3850 ml  Net -2610.08 ml   Filed Weights   05/27/20 0508 05/28/20 0500 05/31/20 0308  Weight: 70.9 kg 74.3 kg 76.4 kg    Telemetry    Atrial flutter with variable AV block with controlled ventricular response in the 90s to low 100s bpm - Personally Reviewed  ECG    No new tracings - Personally Reviewed  Physical Exam   GEN: Elderly and frail appearing; No acute distress.   Neck: No JVD. Cardiac: Irregular, no murmurs, rubs, or gallops.  Respiratory: Clear to auscultation bilaterally.  GI: Soft,  nontender, non-distended.   MS: No edema; No deformity. Neuro:  Alert and oriented to person only; Nonfocal.  Psych: Normal affect.  Labs    Chemistry Recent Labs  Lab 05/26/20 2024 05/27/20 0622 05/29/20 0039 05/29/20 1338 05/30/20 0620 05/30/20 1808 05/31/20 0620  NA 149*   < > 147*  --  145  --  139  K 3.2*   < > 3.3*   < > 3.1* 3.9 3.3*  CL 105   < > 112*  --  110  --  104  CO2 31   < > 29  --  28  --  28  GLUCOSE 123*   < > 148*  --  129*  --  116*  BUN 42*   < > 23  --  14  --  10  CREATININE 0.89   < > 0.79  --  0.75  --  0.75  CALCIUM 10.0   < > 9.3  --  8.8*  --  8.5*  PROT 9.2*  --   --   --   --   --   --   ALBUMIN 3.0*  --   --   --   --   --   --   AST 33  --   --   --   --   --   --  ALT 24  --   --   --   --   --   --   ALKPHOS 53  --   --   --   --   --   --   BILITOT 1.3*  --   --   --   --   --   --   GFRNONAA >60   < > >60  --  >60  --  >60  ANIONGAP 13   < > 6  --  7  --  7   < > = values in this interval not displayed.     Hematology Recent Labs  Lab 05/29/20 0039 05/30/20 0620 05/31/20 0620  WBC 12.2* 8.9 7.5  RBC 3.51* 3.30* 3.91*  HGB 10.9* 10.3* 12.0*  HCT 35.4* 32.6* 38.5*  MCV 100.9* 98.8 98.5  MCH 31.1 31.2 30.7  MCHC 30.8 31.6 31.2  RDW 12.5 12.6 12.4  PLT 149* 149* 147*    Cardiac EnzymesNo results for input(s): TROPONINI in the last 168 hours. No results for input(s): TROPIPOC in the last 168 hours.   BNP Recent Labs  Lab 05/26/20 2024  BNP 256.9*     DDimer No results for input(s): DDIMER in the last 168 hours.   Radiology    DG Abd Portable 1V  Result Date: 05/31/2020 IMPRESSION: Moderate amount of gas throughout small and large bowel consistent with the clinical history of ileus. Electronically Signed   By: Nelson Chimes M.D.   On: 05/31/2020 09:22    Cardiac Studies   2D echo pending  Patient Profile     79 y.o. male with history of CAD with remote MI, chronic atrial flutter, TIA, PVD, HTN, and HLD  admitted with abdominal pain and was found to have an incarcerated right inguinal hernia s/p surgical repair with post operative course complicated with SOB who we are seeing of chronic atrial flutter with RVR.  Assessment & Plan    1. 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: -His atrial flutter dates back at least to 2017 per chart review -Has had documented atrial flutter with slow ventricular response in the 30s bpm on EKG 04/20/2020 -Currently in atrial flutter with variable AV block with controlled ventricular response -CHADS2VASc 6 (HTN, age x 2, vascular disease, TIA x 2) -Previously on Xarelto, though not noted on PTA medications with details uncertain -He is currently not oriented and it remains uncertain if he would be an anticoagulation candidate or not moving forward -He remains NPO until we are certain bowel function has returned per surgery -Diltiazem gtt stopped 12/13 -Rate control with IV metoprolol given he remains NPO, watch for bradycardic rates given history -Check echo now that ventricular rate has improved -Recent TSH normal -Potassium remains low at 3.3, replete to goal 4.0 (IM has consulted pharmacy) -Magnesium normal -Recommend he follow up with EP for consideration of further management of his tachy-brady syndrome with chronic atrial flutter  2. CAD involving the native coronary arteries: -No symptoms concerning for angina -HS-Tn 54 x 2, not consistent with ACS -ASA when able, if not found to be an anticoagulation candidate as above -Outpatient follow up -When able to take oral medication, recommend adding GDMT including statin/beta blocker/ACEi/ARB  3. Hypokalemia: -IM has consulted pharmacy  -Recommend repletion to goal 4.0   For questions or updates, please contact [place] Please consult [email or web address] for contact info under Cardiology/STEMI.    Signed, Christell Faith, PA-C [place] Pager: [phone] 05/31/2020,  1:17  PM

## 2020-05-31 NOTE — Progress Notes (Addendum)
[facility] Day(s): 4.   Post op day(s): 4 Days Post-Op.   Interval History:  Patient seen and examined no acute events or new complaints overnight, out of ICU yesterday.  Patient not most reliable historian given mental status, he notes right sided abdominal soreness, no nausea Previous leukocytosis remains resolved, WBC 7.5K sCr remains normal at 0.75, UO - 4.1L Mild hypokalemia to 3.3 and hypophosphatemia to 1.4 Tachycardia better controlled on diltiazem; cardiology following  He has remained NPO, no reports of bowel function from RN staff  Vital signs in last 24 hours: [min-max] current  Temp:  [97.5 F (36.4 C)-98.3 F (36.8 C)] 97.9 F (36.6 C) (12/13 0744) Pulse Rate:  [61-97] 97 (12/13 0746) Resp:  [16-21] 18 (12/13 0744) BP: (155-177)/(79-123) 172/123 (12/13 0746) SpO2:  [91 %-100 %] 94 % (12/13 0744) Weight:  [76.4 kg] 76.4 kg (12/13 0308)       Weight: 76.4 kg BMI (Calculated): 21.61   Intake/Output last 2 shifts:  12/12 0701 - 12/13 0700 In: 1369.8 [I.V.:904.8; IV Piggyback:465] Out: 4098 [Urine:4150]   Physical Exam:  Constitutional: alert, cooperative and no distress Respiratory: breathing non-labored at rest  Cardiovascular: regular rate and rhythm  Gastrointestinal: Soft, no appreciable tenderness, he does not appear distended but he is still remarkably tympanic throughout, no rebound or guarding Genitourinary: Foley in place Integumentary: Right inguinal and lower midline incisions are both CDI with staples, old blood stained honeycomb dressing removed from right groin.   Labs:  CBC Latest Ref Rng & Units 05/31/2020 05/30/2020 05/29/2020  WBC 4.0 - 10.5 K/uL 7.5 8.9 12.2(H)  Hemoglobin 13.0 - 17.0 g/dL 12.0(L) 10.3(L) 10.9(L)  Hematocrit 39.0 - 52.0 % 38.5(L) 32.6(L) 35.4(L)  Platelets 150 - 400 K/uL 147(L) 149(L) 149(L)   CMP Latest Ref Rng & Units 05/31/2020 05/30/2020 05/30/2020  Glucose 70 - 99  mg/dL 116(H) - 129(H)  BUN 8 - 23 mg/dL 10 - 14  Creatinine 0.61 - 1.24 mg/dL 0.75 - 0.75  Sodium 135 - 145 mmol/L 139 - 145  Potassium 3.5 - 5.1 mmol/L 3.3(L) 3.9 3.1(L)  Chloride 98 - 111 mmol/L 104 - 110  CO2 22 - 32 mmol/L 28 - 28  Calcium 8.9 - 10.3 mg/dL 8.5(L) - 8.8(L)  Total Protein 6.5 - 8.1 g/dL - - -  Total Bilirubin 0.3 - 1.2 mg/dL - - -  Alkaline Phos 38 - 126 U/L - - -  AST 15 - 41 U/L - - -  ALT 0 - 44 U/L - - -     Imaging studies:  CLINICAL DATA:  Abdominal pain.  Ileus.  EXAM: PORTABLE ABDOMEN - 1 VIEW  COMPARISON:  03/27/2020  FINDINGS: Moderate amount of gas throughout small and large bowel consistent with the clinical history of ileus. No focal dilatation to suggest obstruction. No sign of free air. Midline skin staples in the lower abdomen.  IMPRESSION: Moderate amount of gas throughout small and large bowel consistent with the clinical history of ileus.   Electronically Signed   By: Nelson Chimes M.D.   On: 05/31/2020 09:22  Assessment/Plan:  79 y.o. male with POI 4 Days Post-Op s/p exploratory laparotomy, right inguinal hernia repair, small bowel resection, and orchiectomy for strangulated right inguinal hernia with right spermatic cord abscess   - Recommend remain NPO until certain bowel function returned: we should start to consider TPN in the next 24 hours if diet can not be advanced   - advise fleet  enema or dulcolax suppository as it appears there is remarkable colonic gas as well, and hopefully stimulation of the rectum with get things moving again.   - Continue IVF support  - Replete electrolytes; monitor    - Monitor abdominal examination; on-going bowel function             - May need to consider serial KUBs;              - Pain control prn; antiemetics prn              - Continue ABx (Levaquin + Flagyl)   - OOB; recommend engaging PT  - Appreciate medicine and cardiology assistance   All of the above findings and  recommendations were discussed with the patient, and the medical team.  -- Shon Millet, PA-C.  [facility] 05/31/2020, 9:50 AM   Ronny Bacon, M.D., [facility]  05/31/2020 ; 9:54 AM

## 2020-05-31 NOTE — Care Management Important Message (Signed)
Important Message  Patient Details  Name: Craig [name] MRN: [id number] Date of Birth: 1941/03/07   Medicare Important Message Given:  Yes     Dannette Barbara 05/31/2020, 11:58 AM

## 2020-05-31 NOTE — Consult Note (Signed)
[place] for Electrolyte Monitoring and Replacement   Recent Labs: Potassium (mmol/L)  Date Value  05/31/2020 3.3 (L)   Magnesium (mg/dL)  Date Value  05/31/2020 1.8   Calcium (mg/dL)  Date Value  05/31/2020 8.5 (L)   Albumin (g/dL)  Date Value  05/26/2020 3.0 (L)   Phosphorus (mg/dL)  Date Value  05/31/2020 1.4 (L)   Sodium (mmol/L)  Date Value  05/31/2020 139     Assessment: Patient is a 79 y/o M with medical history including CAD s/p CABG in the 1990s, Aflutter, HTN, history of TIA who presented to the ED 12/8 with abdominal pain. Patient subsequently found to have incarcerated right inguinal hernia with strangulated (gangrenous) knuckle of small bowel with perforation, SBO, and right spermatic cord abscess. He is POD # 1 from exploratory laparotomy, right inguinal hernia repair, small bowel resection, and orchiectomy. Patient is currently NPO. No nutritional support at this time. Pharmacy consulted to assist with electrolyte monitoring and replacement as indicated.  MIVF: D5LR at 125 mL/hr  Goal of Therapy:  K 4 Mg 2 All other electrolytes within normal limits  Plan:  K 3.3, Mg 1.8, Phos 1.4 --Will order IV potassium phosphate 45 mmol x 1 (contains 67.5 mEq potassium) --Will order IV magnesium sulfate 2 g x 1 --Follow-up electrolytes tomorrow AM  Benita Gutter 05/31/2020 7:44 AM

## 2020-05-31 NOTE — Progress Notes (Signed)
PROGRESS NOTE    Craig [name]  WIO:[id number] DOB: February 17, 1941 DOA: 05/26/2020 PCP: [name], MD    Brief Narrative:  Craig [name] is a 79 y.o. male with 2 to 3-day history of progressive abdominal pain distention. Found with High-grade small bowel obstruction with a transition point at the entrance of a right inguinal hernia. No bowel perforation. Recommend surgical consultation.  Patient went to surgery for incarcerated right inguinal hernia and small bowel obstruction on 12/9.  Was transferred to ICU.  Was extubated on 12/9. PCCM pickup on 12/11.   Pt pulled out NGT. Currently NPO. Also now in afib rvr started on dilt gtt.   12/12- Calmer today. HR better on cardizem gtt. Still NPO.  12/3 in mittens, confused. Tele afib rvr, aflutter   Consultants:   Pccm, surgery, cardiology  Procedures:   Antimicrobials:   Meropenem   Subjective: Calm, confused in mittens, not really answering my questions   Objective: Vitals:   05/31/20 0744 05/31/20 0746 05/31/20 1125 05/31/20 1200  BP: (!) 176/105 (!) 172/123 (!) 165/90   Pulse: 81 97 (!) 56   Resp: 18  18 17   Temp: 97.9 F (36.6 C)  97.9 F (36.6 C)   TempSrc:      SpO2: 94%  100%   Weight:        Intake/Output Summary (Last 24 hours) at 05/31/2020 1449 Last data filed at 05/31/2020 1409 Gross per 24 hour  Intake 1239.92 ml  Output 5850 ml  Net -4610.08 ml   Filed Weights   05/27/20 0508 05/28/20 0500 05/31/20 0308  Weight: 70.9 kg 74.3 kg 76.4 kg    Examination: NAD, in mittens cta anteriorly no wheezing irreg-reg, s1/s2 no gallops Soft nt/nd+bs No edema Unable to asses as pt is confused, moving all extremitites, no droop     Data Reviewed: I have personally reviewed following labs and imaging studies  CBC: Recent Labs  Lab 05/26/20 2024 05/27/20 0622 05/28/20 0654 05/29/20 0039 05/30/20 0620 05/31/20 0620  WBC 9.4 4.7 9.1 12.2* 8.9 7.5  NEUTROABS 8.1*  --   --   --   --    --   HGB 14.6 12.7* 10.8* 10.9* 10.3* 12.0*  HCT 47.5 42.4 35.4* 35.4* 32.6* 38.5*  MCV 100.6* 104.2* 103.5* 100.9* 98.8 98.5  PLT 269 178 135* 149* 149* 426*   Basic Metabolic Panel: Recent Labs  Lab 05/27/20 0622 05/28/20 0654 05/29/20 0039 05/29/20 1338 05/30/20 0620 05/30/20 1808 05/31/20 0620  NA 146* 146* 147*  --  145  --  139  K 3.4* 3.7 3.3* 3.0* 3.1* 3.9 3.3*  CL 109 112* 112*  --  110  --  104  CO2 25 28 29   --  28  --  28  GLUCOSE 122* 88 148*  --  129*  --  116*  BUN 42* 31* 23  --  14  --  10  CREATININE 0.90 0.96 0.79  --  0.75  --  0.75  CALCIUM 9.4 9.4 9.3  --  8.8*  --  8.5*  MG 2.2 2.0 1.9 2.1 2.0  --  1.8  PHOS 2.8 1.2* <1.0* 1.2* 2.1* 2.5 1.4*   GFR: Estimated Creatinine Clearance: 83.6 mL/min (by C-G formula based on SCr of 0.75 mg/dL). Liver Function Tests: Recent Labs  Lab 05/26/20 2024  AST 33  ALT 24  ALKPHOS 53  BILITOT 1.3*  PROT 9.2*  ALBUMIN 3.0*   Recent Labs  Lab 05/26/20 2024  LIPASE 42   No results for input(s): AMMONIA in the last 168 hours. Coagulation Profile: Recent Labs  Lab 05/26/20 2230  INR 1.3*   Cardiac Enzymes: No results for input(s): CKTOTAL, CKMB, CKMBINDEX, TROPONINI in the last 168 hours. BNP (last 3 results) No results for input(s): PROBNP in the last 8760 hours. HbA1C: No results for input(s): HGBA1C in the last 72 hours. CBG: Recent Labs  Lab 05/30/20 2000 05/30/20 2358 05/31/20 0346 05/31/20 0805 05/31/20 1201  GLUCAP 109* 123* 112* 98 111*   Lipid Profile: No results for input(s): CHOL, HDL, LDLCALC, TRIG, CHOLHDL, LDLDIRECT in the last 72 hours. Thyroid Function Tests: No results for input(s): TSH, T4TOTAL, FREET4, T3FREE, THYROIDAB in the last 72 hours. Anemia Panel: No results for input(s): VITAMINB12, FOLATE, FERRITIN, TIBC, IRON, RETICCTPCT in the last 72 hours. Sepsis Labs: Recent Labs  Lab 05/26/20 2024 05/26/20 2230 05/27/20 0622 05/28/20 0654 05/29/20 0039  PROCALCITON  --    --  0.25 2.58 1.62  LATICACIDVEN 3.0* 1.8  --   --   --     Recent Results (from the past 240 hour(s))  Blood culture (routine single)     Status: None   Collection Time: 05/26/20 10:30 PM   Specimen: BLOOD  Result Value Ref Range Status   Specimen Description BLOOD RIGHT ANTECUBITAL  Final   Special Requests   Final    BOTTLES DRAWN AEROBIC AND ANAEROBIC Blood Culture results may not be optimal due to an inadequate volume of blood received in culture bottles   Culture   Final    NO GROWTH 5 DAYS Performed at [facility], [address] [place], [place] [address]    Report Status 05/31/2020 FINAL  Final  Resp Panel by RT-PCR (Flu A&B, Covid) Nasopharyngeal Swab     Status: None   Collection Time: 05/27/20 12:37 AM   Specimen: Nasopharyngeal Swab; Nasopharyngeal(NP) swabs in vial transport medium  Result Value Ref Range Status   SARS Coronavirus 2 by RT PCR NEGATIVE NEGATIVE Final    Comment: (NOTE) SARS-CoV-2 target nucleic acids are NOT DETECTED.  The SARS-CoV-2 RNA is generally detectable in upper respiratory specimens during the acute phase of infection. The lowest concentration of SARS-CoV-2 viral copies this assay can detect is 138 copies/mL. A negative result does not preclude SARS-Cov-2 infection and should not be used as the sole basis for treatment or other patient management decisions. A negative result may occur with  improper specimen collection/handling, submission of specimen other than nasopharyngeal swab, presence of viral mutation(s) within the areas targeted by this assay, and inadequate number of viral copies(<138 copies/mL). A negative result must be combined with clinical observations, patient history, and epidemiological information. The expected result is Negative.  Fact Sheet for Patients:  EntrepreneurPulse.com.au  Fact Sheet for Healthcare Providers:  IncredibleEmployment.be  This test is no t yet  approved or cleared by the [place] FDA and  has been authorized for detection and/or diagnosis of SARS-CoV-2 by FDA under an Emergency Use Authorization (EUA). This EUA will remain  in effect (meaning this test can be used) for the duration of the COVID-19 declaration under Section 564(b)(1) of the Act, 21 U.S.C.section 360bbb-3(b)(1), unless the authorization is terminated  or revoked sooner.       Influenza A by PCR NEGATIVE NEGATIVE Final   Influenza B by PCR NEGATIVE NEGATIVE Final    Comment: (NOTE) The Xpert Xpress SARS-CoV-2/FLU/RSV plus assay is intended as an aid in the diagnosis of influenza  from Nasopharyngeal swab specimens and should not be used as a sole basis for treatment. Nasal washings and aspirates are unacceptable for Xpert Xpress SARS-CoV-2/FLU/RSV testing.  Fact Sheet for Patients: EntrepreneurPulse.com.au  Fact Sheet for Healthcare Providers: IncredibleEmployment.be  This test is not yet approved or cleared by the [place] FDA and has been authorized for detection and/or diagnosis of SARS-CoV-2 by FDA under an Emergency Use Authorization (EUA). This EUA will remain in effect (meaning this test can be used) for the duration of the COVID-19 declaration under Section 564(b)(1) of the Act, 21 U.S.C. section 360bbb-3(b)(1), unless the authorization is terminated or revoked.  Performed at [facility], [address] [place], [place] [address]   MRSA PCR Screening     Status: None   Collection Time: 05/27/20  5:00 AM   Specimen: Nasopharyngeal  Result Value Ref Range Status   MRSA by PCR NEGATIVE NEGATIVE Final    Comment:        The GeneXpert MRSA Assay (FDA approved for NASAL specimens only), is one component of a comprehensive MRSA colonization surveillance program. It is not intended to diagnose MRSA infection nor to guide or monitor treatment for MRSA infections. Performed at [facility], [address] [place], [place] [address]   Urine culture     Status: None   Collection Time: 05/27/20  5:43 AM   Specimen: In/Out Cath Urine  Result Value Ref Range Status   Specimen Description   Final    IN/OUT CATH URINE Performed at [facility], [address] [place], [place] [address]    Special Requests   Final    NONE Performed at [facility], [address] [place], [place] [address]    Culture   Final    NO GROWTH Performed at [facility], [place] [address], [place], [place] [address]    Report Status 05/28/2020 FINAL  Final         Radiology Studies: DG Abd Portable 1V  Result Date: 05/31/2020 CLINICAL DATA:  Abdominal pain.  Ileus. EXAM: PORTABLE ABDOMEN - 1 VIEW COMPARISON:  03/27/2020 FINDINGS: Moderate amount of gas throughout small and large bowel consistent with the clinical history of ileus. No focal dilatation to suggest obstruction. No sign of free air. Midline skin staples in the lower abdomen. IMPRESSION: Moderate amount of gas throughout small and large bowel consistent with the clinical history of ileus. Electronically Signed   By: Nelson Chimes M.D.   On: 05/31/2020 09:22        Scheduled Meds: . bisacodyl  10 mg Rectal q morning - 10a  . Chlorhexidine Gluconate Cloth  6 each Topical Daily  . enoxaparin (LOVENOX) injection  40 mg Subcutaneous Q24H  . mouth rinse  15 mL Mouth Rinse BID  . metoprolol tartrate  5 mg Intravenous Q6H  . pantoprazole (PROTONIX) IV  40 mg Intravenous Daily   Continuous Infusions: . sodium chloride Stopped (05/27/20 1207)  . dextrose 5% lactated ringers 125 mL/hr at 05/31/20 0557  . diltiazem (CARDIZEM) infusion Stopped (05/31/20 0830)  . meropenem (MERREM) IV 1 g (05/31/20 1421)  . potassium PHOSPHATE IVPB (in mmol) 45 mmol (05/31/20 1233)    Assessment & Plan:   Principal Problem:   SBO (small bowel obstruction) (HCC) Active Problems:   CAD (coronary artery  disease), native coronary artery   History of coronary artery stent placement   Essential hypertension   Dementia with behavioral disturbance (HCC)   Atrial flutter  with rapid ventricular response (HCC)   Hypernatremia   Nicotine dependence  79 y.o. male with POI 4 Days Post-Op s/p exploratory laparotomy, right inguinal hernia repair, small bowel resection, and orchiectomyfor strangulated right inguinal hernia with right spermatic cord abscess, with afib rvr.   1.Incarcerated right inguinal hernia with strangulated(gangrenous)knuckle of small bowelwith perforation, small bowel obstruction and right spermatic cord abscess. POD 4 Primary management per surgical team Per surgery recommend remaining n.p.o. until certain bowel function returned.  Wish to start to consider TPN in the next 24 hours if diet cannot be advanced Continue IV fluids for hydration May need to consider serial KUB Continue meropenem Consult PT     2.  AFib /flutter with RVR- Rate improved with burst of rvr. Cardiology following [place] 6 Per cards was previously on Xarelto? cardizem gtt stopped on 12/13 Use iv metoprolol for rate control given he is still NPO Ck echo since HR better. Cardiology recommends to follow-up with EP for consideration of further management of his tachybradycardia syndrome with chronic atrial flutter     3.Acute metabolic encephalopathy superimposed on dementia with behavioral disturbance (HCC) Still confused Ativan for aggitation In mittens   4. Elevated troponin -likely demand ischemia secondary to small bowel obstruction  Hx: HTN, HLD, CAD, and TIA Cards following Will obtain echo Asa , beta blk, statin when able to take po Outpatient f/u with cars   5.Hypokalemia/hypophosphatemia- Will consult pharmacy for replacement   5.Dementia- On Donepezil, held since pt NPO.   DVT prophylaxis: Lovenox Code Status: Full Family Communication: called daughter again  today but went to VM  Status is: Inpatient  Remains inpatient appropriate because:Inpatient level of care appropriate due to severity of illness   Dispo: The patient is from: Home              Anticipated d/c is to: TBD              Anticipated d/c date is: 3 days              Patient currently is not medically stable to d/c.            LOS: 4 days   Time spent: 45 min with >50% on coc    Nolberto Hanlon, MD [facility] Pager [phone]  If 7PM-7AM, please contact night-coverage 05/31/2020, 2:49 PM

## 2020-05-31 NOTE — Progress Notes (Signed)
   05/31/20 1933  Vitals  Temp 98.8 F (37.1 C)  Temp Source Oral  BP (!) 151/105  MAP (mmHg) 119  BP Location Left Arm  BP Method Automatic  Patient Position (if appropriate) Lying  Pulse Rate (!) 114  Resp 18  Level of Consciousness  Level of Consciousness Alert  MEWS COLOR  MEWS Score Color Yellow  Oxygen Therapy  SpO2 100 %  O2 Device Room Air  MEWS Score  MEWS Temp 0  MEWS Systolic 0  MEWS Pulse 2  MEWS RR 0  MEWS LOC 0  MEWS Score 2   Pt MEWS turned yellow. Charge RN made aware. Unable to take vitals at resting state as patient is agitated at this time. No signs of acute distress at this time. Side rails up, bed in lowest position and bed alarm set. Will continue to monitor.

## 2020-06-01 ENCOUNTER — Inpatient Hospital Stay (HOSPITAL_COMMUNITY)
Admit: 2020-06-01 | Discharge: 2020-06-01 | Disposition: A | Discharge status: Home / Self Care | Payer: Medicare (Managed Care) | Attending: Physician Assistant | Admitting: Physician Assistant

## 2020-06-01 ENCOUNTER — Inpatient Hospital Stay: Payer: Medicare (Managed Care)

## 2020-06-01 ENCOUNTER — Inpatient Hospital Stay: Payer: Self-pay

## 2020-06-01 DIAGNOSIS — I429 Cardiomyopathy, unspecified: Secondary | ICD-10-CM

## 2020-06-01 DIAGNOSIS — I4892 Unspecified atrial flutter: Secondary | ICD-10-CM

## 2020-06-01 DIAGNOSIS — I340 Nonrheumatic mitral (valve) insufficiency: Secondary | ICD-10-CM

## 2020-06-01 LAB — BASIC METABOLIC PANEL
Anion gap: 8 (ref 5–15)
BUN: 8 mg/dL (ref 8–23)
CO2: 27 mmol/L (ref 22–32)
Calcium: 8.3 mg/dL — ABNORMAL LOW (ref 8.9–10.3)
Chloride: 104 mmol/L (ref 98–111)
Creatinine, Ser: 0.85 mg/dL (ref 0.61–1.24)
GFR, Estimated: >60 mL/min (ref >60)
Glucose, Bld: 121 mg/dL — ABNORMAL HIGH (ref 70–99)
Potassium: 3.3 mmol/L — ABNORMAL LOW (ref 3.5–5.1)
Sodium: 139 mmol/L (ref 135–145)

## 2020-06-01 LAB — ECHOCARDIOGRAM COMPLETE
AR max vel: 3.53 cm2
AV Area VTI: 3.97 cm2
AV Area mean vel: 2.99 cm2
AV Mean grad: 2 mmHg
AV Peak grad: 3.1 mmHg
Ao pk vel: 0.88 m/s
Area-P 1/2: 5.26 cm2
S' Lateral: 4.6 cm
Weight: 2645.52 oz

## 2020-06-01 LAB — GLUCOSE, CAPILLARY
Glucose-Capillary: 105 mg/dL — ABNORMAL HIGH (ref 70–99)
Glucose-Capillary: 124 mg/dL — ABNORMAL HIGH (ref 70–99)
Glucose-Capillary: 79 mg/dL (ref 70–99)
Glucose-Capillary: 88 mg/dL (ref 70–99)
Glucose-Capillary: 92 mg/dL (ref 70–99)
Glucose-Capillary: 93 mg/dL (ref 70–99)

## 2020-06-01 LAB — CBC
HCT: 35.4 % — ABNORMAL LOW (ref 39.0–52.0)
Hemoglobin: 11.8 g/dL — ABNORMAL LOW (ref 13.0–17.0)
MCH: 31.6 pg (ref 26.0–34.0)
MCHC: 33.3 g/dL (ref 30.0–36.0)
MCV: 94.7 fL (ref 80.0–100.0)
Platelets: 157 10*3/uL (ref 150–400)
RBC: 3.74 MIL/uL — ABNORMAL LOW (ref 4.22–5.81)
RDW: 12.5 % (ref 11.5–15.5)
WBC: 8.7 10*3/uL (ref 4.0–10.5)
nRBC: 0 % (ref 0.0–0.2)

## 2020-06-01 LAB — PHOSPHORUS: Phosphorus: 1.7 mg/dL — ABNORMAL LOW (ref 2.5–4.6)

## 2020-06-01 LAB — MAGNESIUM: Magnesium: 2 mg/dL (ref 1.7–2.4)

## 2020-06-01 MED ORDER — SODIUM CHLORIDE 0.9% FLUSH
10.0000 mL | Freq: Two times a day (BID) | INTRAVENOUS | Status: DC
  Administered 2020-06-02 – 2020-06-07 (×9): 10 mL
  Administered 2020-06-08: 20 mL
  Administered 2020-06-09 (×2): 10 mL
  Administered 2020-06-10: 20 mL
  Administered 2020-06-10 – 2020-06-11 (×2): 10 mL
  Administered 2020-06-11: 20 mL
  Administered 2020-06-12 – 2020-06-15 (×7): 10 mL

## 2020-06-01 MED ORDER — LACTATED RINGERS IV SOLN: INTRAVENOUS | Status: AC

## 2020-06-01 MED ORDER — TRAVASOL 10 % IV SOLN
INTRAVENOUS | Status: AC
  Filled 2020-06-01: qty 374.4

## 2020-06-01 MED ORDER — SODIUM CHLORIDE 0.9% FLUSH
10.0000 mL | INTRAVENOUS | Status: DC | PRN
Start: 2020-06-01 — End: 2020-06-15
  Administered 2020-06-01: 10 mL

## 2020-06-01 MED ORDER — METOPROLOL TARTRATE 5 MG/5ML IV SOLN: 5.0000 mg | Freq: Four times a day (QID) | INTRAVENOUS | Status: DC | PRN

## 2020-06-01 MED ORDER — INSULIN ASPART 100 UNIT/ML ~~LOC~~ SOLN
0.0000 [IU] | Freq: Three times a day (TID) | SUBCUTANEOUS | Status: DC
  Administered 2020-06-02: 1 [IU] via SUBCUTANEOUS
  Filled 2020-06-01: qty 1

## 2020-06-01 MED ORDER — POTASSIUM PHOSPHATES 15 MMOLE/5ML IV SOLN
45.0000 mmol | Freq: Once | INTRAVENOUS | Status: AC
  Administered 2020-06-01: 45 mmol via INTRAVENOUS
  Filled 2020-06-01: qty 15

## 2020-06-01 MED ORDER — TRACE MINERALS CU-MN-SE-ZN 300-55-60-3000 MCG/ML IV SOLN
INTRAVENOUS | Status: DC
  Filled 2020-06-01: qty 248

## 2020-06-01 NOTE — Progress Notes (Signed)
Peripherally Inserted Central Catheter Placement  The IV Nurse has discussed with the patient and/or persons authorized to consent for the patient, the purpose of this procedure and the potential benefits and risks involved with this procedure.  The benefits include less needle sticks, lab draws from the catheter, and the patient may be discharged home with the catheter. Risks include, but not limited to, infection, bleeding, blood clot (thrombus formation), and puncture of an artery; nerve damage and irregular heartbeat and possibility to perform a PICC exchange if needed/ordered by physician.  Alternatives to this procedure were also discussed.  Bard Power PICC patient education guide, fact sheet on infection prevention and patient information card has been provided to patient /or left at bedside.    PICC Placement Documentation  PICC Double Lumen 06/01/20 PICC Right Brachial 42 cm 0 cm (Active)  Indication for Insertion or Continuance of Line Administration of hyperosmolar/irritating solutions (i.e. TPN, Vancomycin, etc.) 06/01/20 1714  Exposed Catheter (cm) 0 cm 06/01/20 1714  Site Assessment Clean;Dry;Intact 06/01/20 1714  Lumen #1 Status Flushed;Blood return noted 06/01/20 1714  Lumen #2 Status Flushed;Blood return noted 06/01/20 1714  Dressing Type Transparent 06/01/20 1714  Dressing Status Clean;Dry;Intact 06/01/20 1714  Antimicrobial disc in place? Yes 06/01/20 1714  Dressing Intervention New dressing;Other (Comment) 06/01/20 1714  Dressing Change Due 06/08/20 06/01/20 1714   Telephone consent signed by daughter    Synthia Innocent 06/01/2020, 5:16 PM

## 2020-06-01 NOTE — Consult Note (Signed)
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 Patient subsequently found to have incarcerated right inguinal hernia with strangulated (gangrenous) knuckle of small bowel with perforation, SBO, and right spermatic cord abscess. He is POD # 1 from exploratory laparotomy, right inguinal hernia repair, small bowel resection, and orchiectomy. Patient is currently NPO. No nutritional support at this time. Pharmacy consulted to assist with electrolyte monitoring and replacement as indicated.  MIVF: D5LR at 125 mL/hr Nutrition: Plan is to initiate TPN 12/14  Goal of Therapy:  K 4 Mg 2 All other electrolytes within normal limits  Plan:  K 3.3, Mg 2, Phos 1.7 --Will order IV potassium phosphate 45 mmol x 1 (contains 67.5 mEq potassium) --Follow-up electrolytes tomorrow AM  Benita Gutter 06/01/2020 10:15 AM

## 2020-06-01 NOTE — Progress Notes (Signed)
Patient metoprolol dose held (see MAR). Discussed with NP. Patient last dose given off schedule 2 hours due to loss of IV access and difficulty starting new IV. Will hold 0600 dose to keep doses 6 hours apart.    06/01/20 0637  Vitals  BP 117/76  MAP (mmHg) 86  BP Location Right Arm  BP Method Automatic  Patient Position (if appropriate) Lying  Pulse Rate 66  MEWS COLOR  MEWS Score Color Green  MEWS Score  MEWS Temp 0  MEWS Systolic 0  MEWS Pulse 0  MEWS RR 0  MEWS LOC 0  MEWS Score 0

## 2020-06-01 NOTE — Progress Notes (Signed)
[facility] Day(s): 5.   Post op day(s): 5 Days Post-Op.   Interval History:  Patient seen and examined no acute events or new complaints overnight, Patient not most reliable historian given mental status, he reports "he is doing alright this morning," not as participatory this morning  No new labs this morning UO - 5.5L Tachycardia better controlled on diltiazem; cardiology following  He has remained NPO, no reports of bowel function from RN staff  Vital signs in last 24 hours: [min-max] current  Temp:  [97.9 F (36.6 C)-99 F (37.2 C)] 97.9 F (36.6 C) (12/14 0411) Pulse Rate:  [56-115] 66 (12/14 0637) Resp:  [17-20] 20 (12/14 0411) BP: (114-176)/(71-123) 117/76 (12/14 0637) SpO2:  [94 %-100 %] 100 % (12/14 0411) Weight:  [75 kg] 75 kg (12/14 0343)       Weight: 75 kg BMI (Calculated): 21.22   Intake/Output last 2 shifts:  12/13 0701 - 12/14 0700 In: 5194.5 [I.V.:4430.4; IV Piggyback:764.1] Out: 5525 [Urine:5525]   Physical Exam:  Constitutional: alert, cooperative and no distress Respiratory: breathing non-labored at rest  Cardiovascular: regular rate and rhythm  Gastrointestinal: Soft, no appreciable tenderness, he does not appear distended but he is still remarkably tympanic throughout, no rebound or guarding Genitourinary: Foley in place Integumentary: Right inguinal and lower midline incisions are both CDI with staples, right inguinal incision with ABD pad.   Labs:  CBC Latest Ref Rng & Units 05/31/2020 05/30/2020 05/29/2020  WBC 4.0 - 10.5 K/uL 7.5 8.9 12.2(H)  Hemoglobin 13.0 - 17.0 g/dL 12.0(L) 10.3(L) 10.9(L)  Hematocrit 39.0 - 52.0 % 38.5(L) 32.6(L) 35.4(L)  Platelets 150 - 400 K/uL 147(L) 149(L) 149(L)   CMP Latest Ref Rng & Units 05/31/2020 05/30/2020 05/30/2020  Glucose 70 - 99 mg/dL 116(H) - 129(H)  BUN 8 - 23 mg/dL 10 - 14  Creatinine 0.61 - 1.24 mg/dL 0.75 - 0.75  Sodium 135 - 145 mmol/L 139 - 145   Potassium 3.5 - 5.1 mmol/L 3.3(L) 3.9 3.1(L)  Chloride 98 - 111 mmol/L 104 - 110  CO2 22 - 32 mmol/L 28 - 28  Calcium 8.9 - 10.3 mg/dL 8.5(L) - 8.8(L)  Total Protein 6.5 - 8.1 g/dL - - -  Total Bilirubin 0.3 - 1.2 mg/dL - - -  Alkaline Phos 38 - 126 U/L - - -  AST 15 - 41 U/L - - -  ALT 0 - 44 U/L - - -     Imaging studies: no new pertinent imaging studies   Assessment/Plan:  79 y.o. male with POI 5 Days Post-Op s/p exploratory laparotomy, right inguinal hernia repair, small bowel resection, and orchiectomy for strangulated right inguinal hernia with right spermatic cord abscess   - Recommend initiation of TPN today given lack of definitive bowel function and prolonged NPO status  - Advise fleet enema or dulcolax suppository as it appears there is remarkable colonic gas as well, and hopefully stimulation of the rectum with get things moving again.    - Continue IVF support  - Replete electrolytes; monitor    - Monitor abdominal examination; on-going bowel function             - May need to consider serial KUBs;              - Pain control prn; antiemetics prn              - Continue ABx (Levaquin + Flagyl)   - OOB; recommend engaging  PT  - Appreciate medicine and cardiology assistance   All of the above findings and recommendations were discussed with the patient, and the medical team.  -- Edison Simon, PA-C [facility] 06/01/2020, 8:42 AM [phone] M-F: 7am - 4pm

## 2020-06-01 NOTE — Progress Notes (Signed)
Nutrition Follow Up Note   DOCUMENTATION CODES:   Severe malnutrition in context of social or environmental circumstances  INTERVENTION:   TPN per pharmacy  Recommend thiamine 100mg  daily x 3 days added to TPN  Daily weights  Pt at high refeed risk; recommend monitor potassium, magnesium and phosphorus labs daily until stable  NUTRITION DIAGNOSIS:   Severe Malnutrition related to social / environmental circumstances (dementia, suspected inadequate oral intake) as evidenced by severe fat depletion,severe muscle depletion.  GOAL:   Patient will meet greater than or equal to 90% of their needs  -progressing with initiation of TPN  MONITOR:   Diet advancement,Labs,Weight trends,I & O's, TPN  ASSESSMENT:   79 year old male with PMHx of dementia, HTN, atrial flutter, CAD, HLD, TIA admitted with right inguinal hernia with strangulated knuckle of small bowel, small bowel obstruction, and right spermatic cord abscess s/p right inguinal hernia repair, small bowel resection, and right orchiectomy on 12/9.   Pt with suspected post-op ileus. Plan is for PICC line and TPN today as pt has now been NPO x 7 days. Pt is refeeding secondary to IV dextrose; will need to replace phosphorus prior to initiation of TPN. No bowel function yet; plan is for fleet enema or suppository today. NGT removed by patient. Abdominal distension improved. Clear liquid diet started today. UOP 5.5L. Per chart, pt is weight stable stable since admit; will add daily weights.   Medications reviewed and include: dulcolax, lovenox, protonix, LRS with 5% dextrose @125ml /hr, meropenem, KPhos  Labs reviewed: K 3.3(L), P 1.7(L), Mg 2.0 wnl cbgs- 92, 105, 93 x 24 hrs  Diet Order:   Diet Order            Diet clear liquid Room service appropriate? Yes; Fluid consistency: Thin  Diet effective now                EDUCATION NEEDS:   No education needs have been identified at this time  Skin:  Skin Assessment: Skin  Integrity Issues: Skin Integrity Issues:: Incisions Incisions: closed incisions to abdomen and scrotum  Last BM:  Unknown  Height:   Ht Readings from Last 1 Encounters:  05/18/20 6\' 2"  (1.88 m)   Weight:   Wt Readings from Last 1 Encounters:  06/01/20 75 kg   Ideal Body Weight:  86.4 kg  BMI:  Body mass index is 21.23 kg/m.  Estimated Nutritional Needs:   Kcal:  2000-2200  Protein:  100-110 grams  Fluid:  1.8 L/day  Koleen Distance MS, RD, LDN Please refer to [facility] for RD and/or RD on-call/weekend/after hours pager

## 2020-06-01 NOTE — Evaluation (Signed)
Physical Therapy Evaluation Patient Details Name: Craig [name] MRN: [id number] DOB: 1940/10/14 Today's Date: 06/01/2020   History of Present Illness  79 y.o. male with medical history significant for A. fib/flutter not currently on anticoagulation, CAD, HTN, HLD, TIA, and memory loss.  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  Clinical Impression  Pt continues to be confused but was willing to participate with PT with some increased cuing and encouragement.  He was able to stand multiple times at EOB, but tolerated standing more than 15 seconds only once and consistently tried to sit with little warning siting groin pain and generally feeling poorly. He managed just a few side steps along EOB with heavy encouragement.  Clearly he is unsafe to return home at this point, recommending STR to get back to a more functional level.     Follow Up Recommendations SNF;Supervision/Assistance - 24 hour    Equipment Recommendations   (TBD)    Recommendations for Other Services       Precautions / Restrictions Precautions Precautions: Fall Restrictions Weight Bearing Restrictions: No      Mobility  Bed Mobility Overal bed mobility: Needs Assistance Bed Mobility: Supine to Sit;Sit to Supine     Supine to sit: Min assist Sit to supine: Min assist   General bed mobility comments: Pt needed heavy cuing/encourgement but only relatively little direct assist getting to/from supine/sitting EOB    Transfers Overall transfer level: Needs assistance Equipment used: Rolling walker (2 wheeled) Transfers: Sit to/from Stand Sit to Stand: Mod assist         General transfer comment: multiple sit to stand efforts with much cuing and assist.  He was unable to attain fully upright standing (Staying forward flexed at hips and c/o groin pain.  He showed good  effort but each time would sit quickly with little warning citing too much pain to do more  Ambulation/Gait             General Gait Details: unable to tolerate ambulation, managed a few EOB side steps with much encouragement and cuing (despite heavy cuing on last effort to try longer bout of standing/walking, pt wanting to sit almost right away yet again)  Stairs            Wheelchair Mobility    Modified Rankin (Stroke Patients Only)       Balance Overall balance assessment: Needs assistance   Sitting balance-Leahy Scale: Fair       Standing balance-Leahy Scale: Poor Standing balance comment: poor tolerance and ability to maintain even with walker and assist                             Pertinent Vitals/Pain Pain Assessment: Faces Faces Pain Scale: Hurts even more Pain Location: groins during standing    Home Living Family/patient expects to be discharged to:: Unsure                 Additional Comments: appears that he no longer lives alone, pt unable to report but chart review indicated that he is now with his mother (?)    Prior Function           Comments: Pt unable to give cogent history/PLOF/etc     Hand Dominance        Extremity/Trunk Assessment   Upper Extremity Assessment Upper Extremity Assessment:  Generalized weakness;Difficult to assess due to impaired cognition    Lower Extremity Assessment Lower Extremity Assessment: Generalized weakness;Difficult to assess due to impaired cognition       Communication   Communication: Expressive difficulties  Cognition Arousal/Alertness: Lethargic Behavior During Therapy: Restless Overall Cognitive Status: Difficult to assess                                 General Comments: Pt able to state name and DOB, unable to give much other orientation or pre morbid details      General Comments      Exercises     Assessment/Plan    PT Assessment Patient  needs continued PT services  PT Problem List Decreased strength;Decreased range of motion;Decreased balance;Decreased activity tolerance;Decreased mobility;Decreased knowledge of use of DME;Decreased safety awareness;Pain;Cardiopulmonary status limiting activity       PT Treatment Interventions Gait training;Functional mobility training;Therapeutic activities;Therapeutic exercise;Balance training;Neuromuscular re-education;Patient/family education;Cognitive remediation    PT Goals (Current goals can be found in the Care Plan section)  Acute Rehab PT Goals Patient Stated Goal: stop the groin from hurting PT Goal Formulation: With patient Time For Goal Achievement: 06/15/20 Potential to Achieve Goals: Fair    Frequency Min 2X/week   Barriers to discharge        Co-evaluation               AM-PAC PT "6 Clicks" Mobility  Outcome Measure Help needed turning from your back to your side while in a flat bed without using bedrails?: A Little Help needed moving from lying on your back to sitting on the side of a flat bed without using bedrails?: A Little Help needed moving to and from a bed to a chair (including a wheelchair)?: A Lot Help needed standing up from a chair using your arms (e.g., wheelchair or bedside chair)?: A Lot Help needed to walk in hospital room?: Total Help needed climbing 3-5 steps with a railing? : Total 6 Click Score: 12    End of Session Equipment Utilized During Treatment: Gait belt Activity Tolerance: Patient limited by pain;Patient limited by fatigue Patient left: in bed;with call bell/phone within reach;with nursing/sitter in room Nurse Communication: Mobility status PT Visit Diagnosis: Muscle weakness (generalized) (M62.81);Difficulty in walking, not elsewhere classified (R26.2);Pain Pain - Right/Left: Right Pain - part of body: Hip    Time: 1434-1500 PT Time Calculation (min) (ACUTE ONLY): 26 min   Charges:   PT Evaluation $PT Eval Low  Complexity: 1 Low PT Treatments $Therapeutic Activity: 8-22 mins        Kreg Shropshire, DPT 06/01/2020, 4:13 PM

## 2020-06-01 NOTE — Progress Notes (Signed)
*  PRELIMINARY RESULTS* Echocardiogram 2D Echocardiogram has been performed.  Wallie Char Redina Zeller 06/01/2020, 11:57 AM

## 2020-06-01 NOTE — Consult Note (Addendum)
PHARMACY - TOTAL PARENTERAL NUTRITION CONSULT NOTE   Indication: Prolonged ileus  Patient Measurements: Weight: 75 kg (165 lb 5.5 oz)   Body mass index is 21.23 kg/m. 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 Patient subsequently found to have incarcerated right inguinal hernia with strangulated (gangrenous) knuckle of small bowel with perforation, SBO, and right spermatic cord abscess. Pt underwent exploratory laparotomy, right inguinal hernia repair, small bowel resection, and orchiectomy on 12/9. Pt had absent bowel sounds prevously, however small bowel sounds heard so pt is currently on clear, liquid diet. No nutritional support at this time. Pharmacy consulted to initiate TPN for prolonged ileus.   Glucose / Insulin: No history of diabetes Electrolytes: Labs this admission have been notable for hypophosphatemia, hypokalemia, hypomagnesemia.  Renal: Scr < 1 (stable) LFTs / TGs: LFTs WNL, TG ordered for 12/15 AM Prealbumin / albumin: Prealbumin ordered for 12/15 AM, albumin 3 12/8 Intake / Output; MIVF: +6.4 L this admission. MIVF = D5LR @ 125 mL/hr GI Imaging:  12/8 CT abdomen concerning for incarcerated inguinal hernia 12/13 Abdominal X-ray concerning for ileus Surgeries / Procedures:  12/9 exploratory laparotomy, right inguinal hernia repair, small bowel resection, and orchiectomyfor strangulated right inguinal hernia with right spermatic cord abscess  Central access: Anticipate PICC placement 12/14 TPN start date: 12/14 @ 1800 pending PICC placement  Nutritional Goals (per RD recommendation on 12/14): kCal: 2000-2200, Protein: 100-110 grams, Fluid: 1.8L Goal TPN rate is 90 mL/hr (provides 112.3 g of protein and 2080 kcals per day)  Current Nutrition:  Clear, liquid diet  Plan:  -Given lipid emulsion shortage, will reduce lipid  content in TPN to target 15% of total daily kCals from SMOFlipid instead of 30% to align with guidance put forth. AA and dextrose content increased to meet calorie requirements. Will continue to monitor. -Start TPN at 47mL/hr (1/3 goal TPN rate) at 1800 -Electrolytes in TPN: 45mEq/L of Na, 83mEq/L of K, 30mEq/L of Ca, 55mEq/L of Mg, and 52mmol/L of -Phos. Cl:Ac ratio 1:1 -Add standard MVI and trace elements to TPN -Initiate Sensitive q8h SSI and adjust as needed  -Reduce MIVF to 60 mL/hr at 1800 -Monitor TPN labs on Mon/Thurs, daily until stable electrolytes  Benn Moulder, PharmD Pharmacy Resident  06/01/2020 9:14 AM

## 2020-06-01 NOTE — Progress Notes (Signed)
Progress Note  Patient Name: Craig [name] Date of Encounter: 06/01/2020  Primary Cardiologist: Formerly Dr. Clayborn Bigness - Lovena Le  Subjective   Sitter at bedside. Not able to obtain history secondary to confusion.  Inpatient Medications    Scheduled Meds: . bisacodyl  10 mg Rectal q morning - 10a  . Chlorhexidine Gluconate Cloth  6 each Topical Daily  . enoxaparin (LOVENOX) injection  40 mg Subcutaneous Q24H  . mouth rinse  15 mL Mouth Rinse BID  . metoprolol tartrate  5 mg Intravenous Q6H  . pantoprazole (PROTONIX) IV  40 mg Intravenous Daily   Continuous Infusions: . sodium chloride Stopped (06/01/20 0630)  . dextrose 5% lactated ringers 125 mL/hr at 06/01/20 0725  . diltiazem (CARDIZEM) infusion Stopped (05/31/20 0830)  . meropenem (MERREM) IV Stopped (06/01/20 4132)   PRN Meds: acetaminophen **OR** acetaminophen, LORazepam **OR** LORazepam, ondansetron **OR** ondansetron (ZOFRAN) IV, sodium phosphate   Vital Signs    Vitals:   06/01/20 0340 06/01/20 0343 06/01/20 0411 06/01/20 0637  BP: (!) 167/113  114/86 117/76  Pulse: 99  96 66  Resp: 18  20   Temp: 97.9 F (36.6 C)  97.9 F (36.6 C)   TempSrc: Oral  Oral   SpO2: 100%  100%   Weight:  75 kg      Intake/Output Summary (Last 24 hours) at 06/01/2020 0729 Last data filed at 06/01/2020 0725 Gross per 24 hour  Intake 5260.79 ml  Output 5525 ml  Net -264.21 ml   Filed Weights   05/28/20 0500 05/31/20 0308 06/01/20 0343  Weight: 74.3 kg 76.4 kg 75 kg    Telemetry    Atrial flutter with variable AV block with controlled ventricular response in the 70s to low 100s bpm, short run of aberrancy vs NSVT  - Personally Reviewed  ECG    No new tracings - Personally Reviewed  Physical Exam   GEN: Elderly and frail appearing; No acute distress.   Neck: No JVD. Cardiac: Irregular, no murmurs, rubs, or gallops.  Respiratory: Clear to auscultation bilaterally.  GI: Soft, nontender, non-distended.    MS: No edema; No deformity. Neuro:  Somnolent; Nonfocal.  Psych: Somnolent.  Labs    Chemistry Recent Labs  Lab 05/26/20 2024 05/27/20 0622 05/29/20 0039 05/29/20 1338 05/30/20 0620 05/30/20 1808 05/31/20 0620  NA 149*   < > 147*  --  145  --  139  K 3.2*   < > 3.3*   < > 3.1* 3.9 3.3*  CL 105   < > 112*  --  110  --  104  CO2 31   < > 29  --  28  --  28  GLUCOSE 123*   < > 148*  --  129*  --  116*  BUN 42*   < > 23  --  14  --  10  CREATININE 0.89   < > 0.79  --  0.75  --  0.75  CALCIUM 10.0   < > 9.3  --  8.8*  --  8.5*  PROT 9.2*  --   --   --   --   --   --   ALBUMIN 3.0*  --   --   --   --   --   --   AST 33  --   --   --   --   --   --   ALT 24  --   --   --   --   --   --  ALKPHOS 53  --   --   --   --   --   --   BILITOT 1.3*  --   --   --   --   --   --   GFRNONAA >60   < > >60  --  >60  --  >60  ANIONGAP 13   < > 6  --  7  --  7   < > = values in this interval not displayed.     Hematology Recent Labs  Lab 05/29/20 0039 05/30/20 0620 05/31/20 0620  WBC 12.2* 8.9 7.5  RBC 3.51* 3.30* 3.91*  HGB 10.9* 10.3* 12.0*  HCT 35.4* 32.6* 38.5*  MCV 100.9* 98.8 98.5  MCH 31.1 31.2 30.7  MCHC 30.8 31.6 31.2  RDW 12.5 12.6 12.4  PLT 149* 149* 147*    Cardiac EnzymesNo results for input(s): TROPONINI in the last 168 hours. No results for input(s): TROPIPOC in the last 168 hours.   BNP Recent Labs  Lab 05/26/20 2024  BNP 256.9*     DDimer No results for input(s): DDIMER in the last 168 hours.   Radiology    DG Abd Portable 1V  Result Date: 05/31/2020 IMPRESSION: Moderate amount of gas throughout small and large bowel consistent with the clinical history of ileus. Electronically Signed   By: Nelson Chimes M.D.   On: 05/31/2020 09:22    Cardiac Studies   2D echo pending  Patient Profile     79 y.o. male with history of CAD with remote MI, chronic atrial flutter, TIA, PVD, HTN, and HLD admitted with abdominal pain and was found to have an  incarcerated right inguinal hernia s/p surgical repair with post operative course complicated with SOB who we are seeing of chronic atrial flutter with RVR.  Assessment & Plan    1. 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: -His atrial flutter dates back at least to 2017 per chart review -Has had documented atrial flutter with slow ventricular response in the 30s bpm on EKG 04/20/2020 -Currently in atrial flutter with variable AV block with controlled ventricular response with an episode of aberrancy vs NSVT noted on telemetry  -CHADS2VASc 6 (HTN, age x 2, vascular disease, TIA x 2) -Previously on Xarelto, though not noted on PTA medications with details uncertain -He is currently not oriented and it would not likely be an anticoagulation candidate if he remains in his current state -He remains NPO until we are certain bowel function has returned per surgery -Diltiazem gtt stopped 12/13 -Rate control with IV metoprolol given he remains NPO, watch for bradycardic rates given history -Check echo now that ventricular rate has improved -Recent TSH normal -Potassium remains low at 3.3, replete to goal 4.0 (IM has consulted pharmacy) -Magnesium normal -Recommend he follow up with EP for consideration of further management of his tachy-brady syndrome with chronic atrial flutter  2. CAD involving the native coronary arteries: -No symptoms concerning for angina -HS-Tn 54 x 2, not consistent with ACS -Echo pending -ASA when able, if not found to be an anticoagulation candidate as above -Outpatient follow up -When able to take oral medication, recommend adding GDMT including statin/beta blocker/ACEi/ARB  3. Hypokalemia: -IM has consulted pharmacy  -Recommend repletion to goal 4.0   For questions or updates, please contact [place] Please consult [email or web address] for contact info under Cardiology/STEMI.    Signed, Christell Faith, PA-C [place] Pager: [phone] 06/01/2020, 7:29 AM

## 2020-06-01 NOTE — Progress Notes (Signed)
PROGRESS NOTE    Craig [name]  FVC:[id number] DOB: 05/05/1941 DOA: 05/26/2020 PCP: [name], MD    Brief Narrative:  Craig [name] is a 79 y.o. male with 2 to 3-day history of progressive abdominal pain distention. Found with High-grade small bowel obstruction with a transition point at the entrance of a right inguinal hernia. No bowel perforation. Recommend surgical consultation.  Patient went to surgery for incarcerated right inguinal hernia and small bowel obstruction on 12/9.  Was transferred to ICU.  Was extubated on 12/9. PCCM pickup on 12/11.   Pt pulled out NGT. Currently NPO. Also now in afib rvr started on dilt gtt.   12/14-episodes of afib/flutter rvr, in mittens   Consultants:   Pccm, surgery, cardiology  Procedures:   Antimicrobials:   Meropenem   Subjective: Sitter at bedside. Confused, in mittens   Objective: Vitals:   06/01/20 0343 06/01/20 0411 06/01/20 0637 06/01/20 0833  BP:  114/86 117/76 (!) 146/92  Pulse:  96 66 69  Resp:  20  17  Temp:  97.9 F (36.6 C)  97.7 F (36.5 C)  TempSrc:  Oral  Oral  SpO2:  100%  99%  Weight: 75 kg       Intake/Output Summary (Last 24 hours) at 06/01/2020 0848 Last data filed at 06/01/2020 0725 Gross per 24 hour  Intake 3147.72 ml  Output 5525 ml  Net -2377.28 ml   Filed Weights   05/28/20 0500 05/31/20 0308 06/01/20 0343  Weight: 74.3 kg 76.4 kg 75 kg    Examination: Calm, in mittens, confused cta no w/r/r irreg-regu, s1/s2 Soft benign +bs No edema Unable to assess neuro exam     Data Reviewed: I have personally reviewed following labs and imaging studies  CBC: Recent Labs  Lab 05/26/20 2024 05/27/20 0622 05/28/20 0654 05/29/20 0039 05/30/20 0620 05/31/20 0620  WBC 9.4 4.7 9.1 12.2* 8.9 7.5  NEUTROABS 8.1*  --   --   --   --   --   HGB 14.6 12.7* 10.8* 10.9* 10.3* 12.0*  HCT 47.5 42.4 35.4* 35.4* 32.6* 38.5*  MCV 100.6* 104.2* 103.5* 100.9* 98.8 98.5  PLT 269 178  135* 149* 149* 638*   Basic Metabolic Panel: Recent Labs  Lab 05/27/20 0622 05/28/20 0654 05/29/20 0039 05/29/20 1338 05/30/20 0620 05/30/20 1808 05/31/20 0620  NA 146* 146* 147*  --  145  --  139  K 3.4* 3.7 3.3* 3.0* 3.1* 3.9 3.3*  CL 109 112* 112*  --  110  --  104  CO2 25 28 29   --  28  --  28  GLUCOSE 122* 88 148*  --  129*  --  116*  BUN 42* 31* 23  --  14  --  10  CREATININE 0.90 0.96 0.79  --  0.75  --  0.75  CALCIUM 9.4 9.4 9.3  --  8.8*  --  8.5*  MG 2.2 2.0 1.9 2.1 2.0  --  1.8  PHOS 2.8 1.2* <1.0* 1.2* 2.1* 2.5 1.4*   GFR: Estimated Creatinine Clearance: 82 mL/min (by C-G formula based on SCr of 0.75 mg/dL). Liver Function Tests: Recent Labs  Lab 05/26/20 2024  AST 33  ALT 24  ALKPHOS 53  BILITOT 1.3*  PROT 9.2*  ALBUMIN 3.0*   Recent Labs  Lab 05/26/20 2024  LIPASE 42   No results for input(s): AMMONIA in the last 168 hours. Coagulation Profile: Recent Labs  Lab 05/26/20 2230  INR 1.3*  Cardiac Enzymes: No results for input(s): CKTOTAL, CKMB, CKMBINDEX, TROPONINI in the last 168 hours. BNP (last 3 results) No results for input(s): PROBNP in the last 8760 hours. HbA1C: No results for input(s): HGBA1C in the last 72 hours. CBG: Recent Labs  Lab 05/31/20 1610 05/31/20 2103 06/01/20 0050 06/01/20 0450 06/01/20 0833  GLUCAP 128* 119* 92 105* 93   Lipid Profile: No results for input(s): CHOL, HDL, LDLCALC, TRIG, CHOLHDL, LDLDIRECT in the last 72 hours. Thyroid Function Tests: No results for input(s): TSH, T4TOTAL, FREET4, T3FREE, THYROIDAB in the last 72 hours. Anemia Panel: No results for input(s): VITAMINB12, FOLATE, FERRITIN, TIBC, IRON, RETICCTPCT in the last 72 hours. Sepsis Labs: Recent Labs  Lab 05/26/20 2024 05/26/20 2230 05/27/20 0622 05/28/20 0654 05/29/20 0039  PROCALCITON  --   --  0.25 2.58 1.62  LATICACIDVEN 3.0* 1.8  --   --   --     Recent Results (from the past 240 hour(s))  Blood culture (routine single)      Status: None   Collection Time: 05/26/20 10:30 PM   Specimen: BLOOD  Result Value Ref Range Status   Specimen Description BLOOD RIGHT ANTECUBITAL  Final   Special Requests   Final    BOTTLES DRAWN AEROBIC AND ANAEROBIC Blood Culture results may not be optimal due to an inadequate volume of blood received in culture bottles   Culture   Final    NO GROWTH 5 DAYS Performed at [facility], [address] [place], [place] [address]    Report Status 05/31/2020 FINAL  Final  Resp Panel by RT-PCR (Flu A&B, Covid) Nasopharyngeal Swab     Status: None   Collection Time: 05/27/20 12:37 AM   Specimen: Nasopharyngeal Swab; Nasopharyngeal(NP) swabs in vial transport medium  Result Value Ref Range Status   SARS Coronavirus 2 by RT PCR NEGATIVE NEGATIVE Final    Comment: (NOTE) SARS-CoV-2 target nucleic acids are NOT DETECTED.  The SARS-CoV-2 RNA is generally detectable in upper respiratory specimens during the acute phase of infection. The lowest concentration of SARS-CoV-2 viral copies this assay can detect is 138 copies/mL. A negative result does not preclude SARS-Cov-2 infection and should not be used as the sole basis for treatment or other patient management decisions. A negative result may occur with  improper specimen collection/handling, submission of specimen other than nasopharyngeal swab, presence of viral mutation(s) within the areas targeted by this assay, and inadequate number of viral copies(<138 copies/mL). A negative result must be combined with clinical observations, patient history, and epidemiological information. The expected result is Negative.  Fact Sheet for Patients:  EntrepreneurPulse.com.au  Fact Sheet for Healthcare Providers:  IncredibleEmployment.be  This test is no t yet approved or cleared by the [place] FDA and  has been authorized for detection and/or diagnosis of SARS-CoV-2 by FDA under an Emergency Use  Authorization (EUA). This EUA will remain  in effect (meaning this test can be used) for the duration of the COVID-19 declaration under Section 564(b)(1) of the Act, 21 U.S.C.section 360bbb-3(b)(1), unless the authorization is terminated  or revoked sooner.       Influenza A by PCR NEGATIVE NEGATIVE Final   Influenza B by PCR NEGATIVE NEGATIVE Final    Comment: (NOTE) The Xpert Xpress SARS-CoV-2/FLU/RSV plus assay is intended as an aid in the diagnosis of influenza from Nasopharyngeal swab specimens and should not be used as a sole basis for treatment. Nasal washings and aspirates are unacceptable for Xpert Xpress SARS-CoV-2/FLU/RSV testing.  Fact Sheet for Patients: EntrepreneurPulse.com.au  Fact Sheet for Healthcare Providers: IncredibleEmployment.be  This test is not yet approved or cleared by the [place] FDA and has been authorized for detection and/or diagnosis of SARS-CoV-2 by FDA under an Emergency Use Authorization (EUA). This EUA will remain in effect (meaning this test can be used) for the duration of the COVID-19 declaration under Section 564(b)(1) of the Act, 21 U.S.C. section 360bbb-3(b)(1), unless the authorization is terminated or revoked.  Performed at [facility], [address] [place], [place] [address]   MRSA PCR Screening     Status: None   Collection Time: 05/27/20  5:00 AM   Specimen: Nasopharyngeal  Result Value Ref Range Status   MRSA by PCR NEGATIVE NEGATIVE Final    Comment:        The GeneXpert MRSA Assay (FDA approved for NASAL specimens only), is one component of a comprehensive MRSA colonization surveillance program. It is not intended to diagnose MRSA infection nor to guide or monitor treatment for MRSA infections. Performed at [facility], [address] [place], [place] [address]   Urine culture     Status: None   Collection Time: 05/27/20  5:43 AM   Specimen: In/Out  Cath Urine  Result Value Ref Range Status   Specimen Description   Final    IN/OUT CATH URINE Performed at [facility], [address] [place], [place] [address]    Special Requests   Final    NONE Performed at [facility], [address] [place], [place] [address]    Culture   Final    NO GROWTH Performed at [facility], [place] [address], [place], [place] [address]    Report Status 05/28/2020 FINAL  Final         Radiology Studies: DG Abd Portable 1V  Result Date: 05/31/2020 CLINICAL DATA:  Abdominal pain.  Ileus. EXAM: PORTABLE ABDOMEN - 1 VIEW COMPARISON:  03/27/2020 FINDINGS: Moderate amount of gas throughout small and large bowel consistent with the clinical history of ileus. No focal dilatation to suggest obstruction. No sign of free air. Midline skin staples in the lower abdomen. IMPRESSION: Moderate amount of gas throughout small and large bowel consistent with the clinical history of ileus. Electronically Signed   By: Nelson Chimes M.D.   On: 05/31/2020 09:22        Scheduled Meds: . bisacodyl  10 mg Rectal q morning - 10a  . Chlorhexidine Gluconate Cloth  6 each Topical Daily  . enoxaparin (LOVENOX) injection  40 mg Subcutaneous Q24H  . mouth rinse  15 mL Mouth Rinse BID  . metoprolol tartrate  5 mg Intravenous Q6H  . pantoprazole (PROTONIX) IV  40 mg Intravenous Daily   Continuous Infusions: . sodium chloride Stopped (06/01/20 0630)  . dextrose 5% lactated ringers 125 mL/hr at 06/01/20 0836  . diltiazem (CARDIZEM) infusion Stopped (05/31/20 0830)  . meropenem (MERREM) IV Stopped (06/01/20 6222)    Assessment & Plan:   Principal Problem:   SBO (small bowel obstruction) (HCC) Active Problems:   CAD (coronary artery disease), native coronary artery   History of coronary artery stent placement   Essential hypertension   Dementia with behavioral disturbance (HCC)   Atrial flutter with rapid ventricular response (HCC)    Hypernatremia   Nicotine dependence  79 y.o. male with POI 4 Days Post-Op s/p exploratory laparotomy, right inguinal hernia repair, small bowel resection, and orchiectomyfor strangulated right inguinal hernia with right  spermatic cord abscess, with afib rvr.   1.Incarcerated right inguinal hernia with strangulated(gangrenous)knuckle of small bowelwith perforation, small bowel obstruction and right spermatic cord abscess. POD 5  Discussed with surgical PA about initiation of TPN today as pt has been NPO for several days. Dietician consultation palced. PA will order TPN Continue ivf Consider serial KUBS Continue abx- on meropenem. Per PA note states levaquin + metron. Messaged attending Dr. Christian Mate to see which abx . For now continue meropenem until hear back from surgery attending.     2.  AFib /flutter with RVR- Rate improved with burst of rvr. Cardiology following [place] 6 Continue with IV beta-blockers  Unable to give p.o. medications  Avoid diltiazem due to setting of his low EF  Echo with EF 25 to 30%  Not a good candidate for anticoagulation at this time (need to readdress this to see if it is even a good candidate long-term as well).   Once mental status recovers, may need to have ischemic evaluation in the setting of cardiomyopathy since EF in 2017 was 50% on echo dated  If additional rate control is necessary, could consider digoxin.   3.Acute metabolic encephalopathy superimposed on dementia with behavioral disturbance (HCC) Still confused, in mittens Ativan as needed   4. Elevated troponin -likely demand ischemia secondary to small bowel obstruction  Hx: HTN, HLD, CAD, and TIA Cards following Echo EF 25 to 30% which is new from his 2017 echo  We will need to start statins, aspirin, beta-blockers when able to take p.o.  Outpatient follow-up with cardiology for ischemic work-up versus inpatient     5.Hypokalemia/hypophosphatemia- Will consult pharmacy  for replacement   5.Dementia- On Donepezil, held since pt NPO.   DVT prophylaxis: Lovenox Code Status: Full Family Communication: called daughter again today but went to VM  Status is: Inpatient  Remains inpatient appropriate because:Inpatient level of care appropriate due to severity of illness   Dispo: The patient is from: Home              Anticipated d/c is to: TBD              Anticipated d/c date is: 3 days              Patient currently is not medically stable to d/c.            LOS: 5 days   Time spent: 35 min with >50% on coc    Nolberto Hanlon, MD [facility] Pager [phone]  If 7PM-7AM, please contact night-coverage 06/01/2020, 8:48 AM

## 2020-06-02 DIAGNOSIS — I5020 Unspecified systolic (congestive) heart failure: Secondary | ICD-10-CM

## 2020-06-02 LAB — DIFFERENTIAL
Abs Immature Granulocytes: 0.07 10*3/uL (ref 0.00–0.07)
Basophils Absolute: 0 10*3/uL (ref 0.0–0.1)
Basophils Relative: 0 %
Eosinophils Absolute: 0 10*3/uL (ref 0.0–0.5)
Eosinophils Relative: 1 %
Immature Granulocytes: 1 %
Lymphocytes Relative: 10 %
Lymphs Abs: 0.8 10*3/uL (ref 0.7–4.0)
Monocytes Absolute: 0.4 10*3/uL (ref 0.1–1.0)
Monocytes Relative: 5 %
Neutro Abs: 6.5 10*3/uL (ref 1.7–7.7)
Neutrophils Relative %: 83 %

## 2020-06-02 LAB — COMPREHENSIVE METABOLIC PANEL
ALT: 42 U/L (ref 0–44)
AST: 63 U/L — ABNORMAL HIGH (ref 15–41)
Albumin: 2 g/dL — ABNORMAL LOW (ref 3.5–5.0)
Alkaline Phosphatase: 54 U/L (ref 38–126)
Anion gap: 7 (ref 5–15)
BUN: 9 mg/dL (ref 8–23)
CO2: 27 mmol/L (ref 22–32)
Calcium: 8.2 mg/dL — ABNORMAL LOW (ref 8.9–10.3)
Chloride: 104 mmol/L (ref 98–111)
Creatinine, Ser: 0.7 mg/dL (ref 0.61–1.24)
GFR, Estimated: >60 mL/min (ref >60)
Glucose, Bld: 110 mg/dL — ABNORMAL HIGH (ref 70–99)
Potassium: 3.6 mmol/L (ref 3.5–5.1)
Sodium: 138 mmol/L (ref 135–145)
Total Bilirubin: 0.8 mg/dL (ref 0.3–1.2)
Total Protein: 6.2 g/dL — ABNORMAL LOW (ref 6.5–8.1)

## 2020-06-02 LAB — PREALBUMIN: Prealbumin: 7.4 mg/dL — ABNORMAL LOW (ref 18–38)

## 2020-06-02 LAB — CBC
HCT: 37.6 % — ABNORMAL LOW (ref 39.0–52.0)
Hemoglobin: 12.3 g/dL — ABNORMAL LOW (ref 13.0–17.0)
MCH: 31.7 pg (ref 26.0–34.0)
MCHC: 32.7 g/dL (ref 30.0–36.0)
MCV: 96.9 fL (ref 80.0–100.0)
Platelets: 160 10*3/uL (ref 150–400)
RBC: 3.88 MIL/uL — ABNORMAL LOW (ref 4.22–5.81)
RDW: 12.5 % (ref 11.5–15.5)
WBC: 7.8 10*3/uL (ref 4.0–10.5)
nRBC: 0 % (ref 0.0–0.2)

## 2020-06-02 LAB — GLUCOSE, CAPILLARY
Glucose-Capillary: 101 mg/dL — ABNORMAL HIGH (ref 70–99)
Glucose-Capillary: 103 mg/dL — ABNORMAL HIGH (ref 70–99)
Glucose-Capillary: 112 mg/dL — ABNORMAL HIGH (ref 70–99)
Glucose-Capillary: 128 mg/dL — ABNORMAL HIGH (ref 70–99)
Glucose-Capillary: 143 mg/dL — ABNORMAL HIGH (ref 70–99)
Glucose-Capillary: 98 mg/dL (ref 70–99)

## 2020-06-02 LAB — MAGNESIUM: Magnesium: 1.9 mg/dL (ref 1.7–2.4)

## 2020-06-02 LAB — TRIGLYCERIDES: Triglycerides: 79 mg/dL (ref <150)

## 2020-06-02 LAB — PHOSPHORUS: Phosphorus: 2.3 mg/dL — ABNORMAL LOW (ref 2.5–4.6)

## 2020-06-02 MED ORDER — POTASSIUM & SODIUM PHOSPHATES 280-160-250 MG PO PACK
2.0000 | PACK | ORAL | Status: AC
  Administered 2020-06-02 (×2): 2 via ORAL
  Filled 2020-06-02 (×2): qty 2

## 2020-06-02 MED ORDER — LACTATED RINGERS IV SOLN: INTRAVENOUS | Status: AC

## 2020-06-02 MED ORDER — TRAVASOL 10 % IV SOLN
INTRAVENOUS | Status: AC
  Filled 2020-06-02: qty 748.8

## 2020-06-02 NOTE — Progress Notes (Signed)
Progress Note  Patient Name: Craig [name] Date of Encounter: 06/02/2020  Primary Cardiologist: Formerly Dr. Clayborn Bigness - Lovena Le  Subjective   More lucid this morning. Mittens are off. No sitter currently. Not oriented per place or time. He is chronically in atrial flutter with reasonably controlled ventricular rates. No chest pain or dyspnea. Echo 06/01/2020 showed an EF of 25-30%, global HK, moderate LVH, mildly reduced RVDF with normal RV cavity size, mild biatrial enlargement, small circumferential pericardial effusion, mild to moderate MR with mild holosystolic MVP, and a mildly dilated aortic root.   Inpatient Medications    Scheduled Meds: . bisacodyl  10 mg Rectal q morning - 10a  . Chlorhexidine Gluconate Cloth  6 each Topical Daily  . enoxaparin (LOVENOX) injection  40 mg Subcutaneous Q24H  . insulin aspart  0-9 Units Subcutaneous Q8H  . mouth rinse  15 mL Mouth Rinse BID  . metoprolol tartrate  5 mg Intravenous Q6H  . pantoprazole (PROTONIX) IV  40 mg Intravenous Daily  . sodium chloride flush  10-40 mL Intracatheter Q12H   Continuous Infusions: . sodium chloride Stopped (06/01/20 0630)  . lactated ringers 70 mL/hr at 06/02/20 0823  . meropenem (MERREM) IV 1 g (06/02/20 0546)  . TPN ADULT (ION) 30 mL/hr at 06/02/20 0522   PRN Meds: acetaminophen **OR** acetaminophen, LORazepam **OR** LORazepam, metoprolol tartrate, ondansetron **OR** ondansetron (ZOFRAN) IV, sodium chloride flush, sodium phosphate   Vital Signs    Vitals:   06/02/20 0031 06/02/20 0120 06/02/20 0313 06/02/20 0719  BP: (!) 191/112 (!) 158/83 (!) 150/99 (!) 148/97  Pulse:  64 82 84  Resp:   18 18  Temp: 97.8 F (36.6 C)  97.8 F (36.6 C) 97.8 F (36.6 C)  TempSrc: Oral  Oral Axillary  SpO2: 97%  98% 98%  Weight:   73.9 kg     Intake/Output Summary (Last 24 hours) at 06/02/2020 0917 Last data filed at 06/02/2020 0522 Gross per 24 hour  Intake 1501.81 ml  Output 1300 ml  Net  201.81 ml   Filed Weights   05/31/20 0308 06/01/20 0343 06/02/20 0313  Weight: 76.4 kg 75 kg 73.9 kg    Telemetry    Atrial flutter with variable AV block with controlled ventricular response in the 70s to low 100s bpm, short run of aberrancy vs NSVT  - Personally Reviewed  ECG    No new tracings - Personally Reviewed  Physical Exam   GEN: Elderly and frail appearing; No acute distress.   Neck: No JVD. Cardiac: Irregular, no murmurs, rubs, or gallops.  Respiratory: Clear to auscultation bilaterally.  GI: Soft, nontender, non-distended.   MS: No edema; No deformity. Neuro:  Alert and oriented to person only; Nonfocal.  Psych: Normal affect.  Labs    Chemistry Recent Labs  Lab 05/26/20 2024 05/27/20 0622 05/31/20 0620 06/01/20 0921 06/02/20 0550  NA 149*   < > 139 139 138  K 3.2*   < > 3.3* 3.3* 3.6  CL 105   < > 104 104 104  CO2 31   < > 28 27 27   GLUCOSE 123*   < > 116* 121* 110*  BUN 42*   < > 10 8 9   CREATININE 0.89   < > 0.75 0.85 0.70  CALCIUM 10.0   < > 8.5* 8.3* 8.2*  PROT 9.2*  --   --   --  6.2*  ALBUMIN 3.0*  --   --   --  2.0*  AST 33  --   --   --  63*  ALT 24  --   --   --  42  ALKPHOS 53  --   --   --  54  BILITOT 1.3*  --   --   --  0.8  GFRNONAA >60   < > >60 >60 >60  ANIONGAP 13   < > 7 8 7    < > = values in this interval not displayed.     Hematology Recent Labs  Lab 05/31/20 0620 06/01/20 0921 06/02/20 0550  WBC 7.5 8.7 7.8  RBC 3.91* 3.74* 3.88*  HGB 12.0* 11.8* 12.3*  HCT 38.5* 35.4* 37.6*  MCV 98.5 94.7 96.9  MCH 30.7 31.6 31.7  MCHC 31.2 33.3 32.7  RDW 12.4 12.5 12.5  PLT 147* 157 160    Cardiac EnzymesNo results for input(s): TROPONINI in the last 168 hours. No results for input(s): TROPIPOC in the last 168 hours.   BNP Recent Labs  Lab 05/26/20 2024  BNP 256.9*     DDimer No results for input(s): DDIMER in the last 168 hours.   Radiology    DG Abd Portable 1V  Result Date: 05/31/2020 IMPRESSION: Moderate  amount of gas throughout small and large bowel consistent with the clinical history of ileus. Electronically Signed   By: Nelson Chimes M.D.   On: 05/31/2020 09:22    Cardiac Studies   2D echo pending  Patient Profile     79 y.o. male with history of CAD with remote MI, chronic atrial flutter, TIA, PVD, HTN, and HLD admitted with abdominal pain and was found to have an incarcerated right inguinal hernia s/p surgical repair with post operative course complicated with SOB who we are seeing of chronic atrial flutter with RVR.  Assessment & Plan    1. 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: -His atrial flutter dates back at least to 2017 per chart review -Has had documented atrial flutter with slow ventricular response in the 30s bpm on EKG 04/20/2020 -Currently in atrial flutter with variable AV block with controlled ventricular response with an episode of aberrancy vs NSVT noted on telemetry  -CHADS2VASc 6 (HTN, age x 2, vascular disease, TIA x 2) -Previously on Xarelto, though not noted on PTA medications with details uncertain -He is currently not oriented and it would not likely be an anticoagulation candidate if he remains in his current state, possibly even long term -He remains NPO until we are certain bowel function has returned per surgery -Diltiazem gtt stopped 12/13 and should be avoided moving forward given his cardiomyopathy  -Rate control with IV metoprolol given he remains NPO, watch for bradycardic rates given history -If needed, digoxin could be used judiciously  -When able to take oral medications, recommend transitioning to Toprol XL or Coreg as outlined below -Echo as above -Recent TSH normal -Magnesium normal -Potassium 3.6 -Recommend he follow up with EP for consideration of further management of his tachy-brady syndrome with chronic atrial flutter  2. CAD involving the native coronary arteries: -No symptoms concerning for angina -HS-Tn 54 x 2,  not consistent with ACS -Echo as above -ASA when able, if not found to be an anticoagulation candidate as above -Outpatient follow up as outlined below   3. HFrEF with RV dysfunction: -He does not appear to be grossly volume overloaded -Maintain a net even fluid balance -Continue IV metoprolol for rate control, when able, transition to Toprol XL or Coreg given his  cardiomyopathy  -When able to take oral medications consistently recommend escalation of GDMT as tolerated with addition of ACEi/ARB/ARNI/MRA if able -Diltiazem should be avoided with his cardiomyopathy -As his mental status improves, possible ischemic testing will need to be considered given his cardiomyopathy, however, this is not currently an option   4. Hypokalemia: -IM has consulted pharmacy  -Recommend repletion to goal 4.0  5. Hypoalbuminemia: -TPN per surgery    For questions or updates, please contact [place] Please consult [email or web address] for contact info under Cardiology/STEMI.    Signed, Christell Faith, PA-C [facility] Pager: [phone] 06/02/2020, 9:17 AM

## 2020-06-02 NOTE — Consult Note (Signed)
PHARMACY - TOTAL PARENTERAL NUTRITION CONSULT NOTE   Indication: Prolonged ileus  Patient Measurements: Weight: 73.9 kg (162 lb 14.4 oz)   Body mass index is 20.92 kg/m. 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 Patient subsequently found to have incarcerated right inguinal hernia with strangulated (gangrenous) knuckle of small bowel with perforation, SBO, and right spermatic cord abscess. Pt underwent exploratory laparotomy, right inguinal hernia repair, small bowel resection, and orchiectomy on 12/9. Pt had absent bowel sounds prevously, however small bowel sounds heard so pt is currently on clear, liquid diet. No nutritional support at this time. Pharmacy consulted to initiate TPN for prolonged ileus.   Glucose / Insulin: No history of diabetes. Blood glucose < 180 - no insulin required last 24 h  Electrolytes: Hypokalemia resolved, hypophosphatemia improved, hypomagnesemia resolved Renal: Scr < 1 (stable) LFTs / TGs: LFTs WNL, TG 79 Prealbumin / albumin: Prealbumin pending, albumin 3 12/8 Intake / Output; MIVF: +6.6 L this admission. MIVF = LR @ 70 mL/hr GI Imaging:  12/8 CT abdomen concerning for incarcerated inguinal hernia 12/13 Abdominal X-ray concerning for ileus Surgeries / Procedures:  12/9 exploratory laparotomy, right inguinal hernia repair, small bowel resection, and orchiectomyfor strangulated right inguinal hernia with right spermatic cord abscess  Central access: PICC placed 12/14 TPN start date: 12/14 @ 1800  Nutritional Goals (per RD recommendation on 12/14): kCal: 2000-2200, Protein: 100-110 grams, Fluid: 1.8L Goal TPN rate is 90 mL/hr (provides 112.3 g of protein and 2080 kcals per day)  Current Nutrition:  Clear, liquid diet  Plan:  -Given lipid emulsion shortage, will reduce lipid content in TPN to target 15% of  total daily kCals from [employer] instead of 30% to align with guidance put forth. AA and dextrose content increased to meet calorie requirements. Will continue to monitor. -Increase TPN to 72mL/hr (2/3 goal TPN rate) at 1800 -Electrolytes in TPN: 37mEq/L of Na, 58mEq/L of K, 45mEq/L of Ca, 51mEq/L of Mg, and 76mmol/L of -Phos. Cl:Ac ratio 1:1  K 3.6, phos 2.3 - ordered Phos-Nak 2 packets q4h x 2 doses (contains 28.4 mEq K) -Add standard MVI and trace elements to TPN -Initiate Sensitive q8h SSI and adjust as needed  -Reduce MIVF to 40 mL/hr at 1800 -Monitor TPN labs on Mon/Thurs, daily until stable electrolytes  Benn Moulder, PharmD Pharmacy Resident  06/02/2020 10:25 AM

## 2020-06-02 NOTE — Progress Notes (Signed)
[facility] Day(s): 6.   Post op day(s): 6 Days Post-Op.   Interval History:  Patient seen and examined no acute events or new complaints overnight, Patient not most reliable historian given mental status, he reports "pain" but he can not localize nor elaborate on this. Remaining history is challenging Remains without leukocytosis, WBC 7.8 sCr remains normal at 0.70, UO - 1.3L + unmeasured  Very mild hypophosphatemia to 2.3 o/w no significant electrolyte derangements  Tachycardia controlled on metoprolol; cardiology following  He was started on TPN yesterday given lack of bowel function/NPO status, but was also started on CLD as well, but it is unclear how much he had or tolerated. No objective reports of bowel function.  Worked with PT; recommending SNF  Vital signs in last 24 hours: [min-max] current  Temp:  [97.7 F (36.5 C)-98.3 F (36.8 C)] 97.8 F (36.6 C) (12/15 0313) Pulse Rate:  [64-88] 82 (12/15 0313) Resp:  [16-18] 18 (12/15 0313) BP: (141-195)/(80-112) 150/99 (12/15 0313) SpO2:  [97 %-100 %] 98 % (12/15 0313) Weight:  [73.9 kg] 73.9 kg (12/15 0313)       Weight: 73.9 kg BMI (Calculated): 20.91   Intake/Output last 2 shifts:  12/14 0701 - 12/15 0700 In: 1568.1 [P.O.:540; I.V.:1028.1] Out: 1300 [Urine:1300]   Physical Exam:  Constitutional: alert, cooperative and no distress Respiratory: breathing non-labored at rest  Cardiovascular: regular rate and rhythm  Gastrointestinal: Soft, no appreciable tenderness, he does not appear distended but he is still remarkably tympanic throughout, no rebound or guarding Integumentary: Right inguinal and lower midline incisions are both CDI with staples, serosanguinous output on inguinal incisional dressing  Labs:  CBC Latest Ref Rng & Units 06/02/2020 06/01/2020 05/31/2020  WBC 4.0 - 10.5 K/uL 7.8 8.7 7.5  Hemoglobin 13.0 - 17.0 g/dL 12.3(L) 11.8(L) 12.0(L)  Hematocrit 39.0 -  52.0 % 37.6(L) 35.4(L) 38.5(L)  Platelets 150 - 400 K/uL 160 157 147(L)   CMP Latest Ref Rng & Units 06/02/2020 06/01/2020 05/31/2020  Glucose 70 - 99 mg/dL 110(H) 121(H) 116(H)  BUN 8 - 23 mg/dL 9 8 10   Creatinine 0.61 - 1.24 mg/dL 0.70 0.85 0.75  Sodium 135 - 145 mmol/L 138 139 139  Potassium 3.5 - 5.1 mmol/L 3.6 3.3(L) 3.3(L)  Chloride 98 - 111 mmol/L 104 104 104  CO2 22 - 32 mmol/L 27 27 28   Calcium 8.9 - 10.3 mg/dL 8.2(L) 8.3(L) 8.5(L)  Total Protein 6.5 - 8.1 g/dL 6.2(L) - -  Total Bilirubin 0.3 - 1.2 mg/dL 0.8 - -  Alkaline Phos 38 - 126 U/L 54 - -  AST 15 - 41 U/L 63(H) - -  ALT 0 - 44 U/L 42 - -     Imaging studies: no new pertinent imaging studies   Assessment/Plan:  79 y.o. 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  -- Edison Simon, PA-C [facility] 06/02/2020, 7:17 AM [phone] M-F: 7am - 4pm

## 2020-06-02 NOTE — Progress Notes (Signed)
PROGRESS NOTE    Craig [name]  RXV:[id number] DOB: 08-19-1940 DOA: 05/26/2020 PCP: [name], MD    Assessment & Plan:   Principal Problem:   SBO (small bowel obstruction) ([place]) Active Problems:   CAD (coronary artery disease), native coronary artery   History of coronary artery stent placement   Essential hypertension   Dementia with behavioral disturbance (HCC)   Atrial flutter with rapid ventricular response (HCC)   Hypernatremia   Nicotine dependence   Incarcerated right inguinal hernia: w/ strangulated(gangrenous)knuckle of small bowelwith perforation, small bowel obstruction and right spermatic cord abscess. Continue w/ NPO. Continue w/ TPN, started 06/01/20. Continue on IVFs. Continue on IV merrem.   A, fib /flutter: w/ RVR. Possibly PAF. Continue on IV metoprolol while NPO. Not a good candidate for anticoagulation currently secondary to above.  Likely acute on chronic systolic CHF: echo shows [id number]%, LV demonstrates global hypokinesis & diastolic parameters are indeterminate. Continue on IV metoprolol while NPO. May need ischemic work-up as per cardio   Acute metabolic encephalopathy: with hx of dementia w/ behavioral disturbance. Re-orient prn   Elevated troponin: likely secondary to demand ischemia. May need an ischemic work- up as per cardio   Hypokalemia: WNL today. Will continue to monitor   Hypophosphatemia: replete as per pharmacy. Will continue to monitor   Dementia-: continue to hold donepezil while NPO    DVT prophylaxis: lovenox  Code Status: full  Family Communication:  Disposition Plan: depends on PT/OT recs    Status is: Inpatient  Remains inpatient appropriate because:Ongoing diagnostic testing needed not appropriate for outpatient work up, Unsafe d/c plan, IV treatments appropriate due to intensity of illness or inability to take PO and Inpatient level of care appropriate due to severity of illness   Dispo: The patient is  from: Home              Anticipated d/c is to: SNF              Anticipated d/c date is: > 3 days              Patient currently is not medically stable to d/c.         Consultants:   Cardio  General surg    Procedures:    Antimicrobials: merrem   Subjective: Pt c/o difficulty having a bowel movement on a bed pan.   Objective: Vitals:   06/02/20 0031 06/02/20 0120 06/02/20 0313 06/02/20 0719  BP: (!) 191/112 (!) 158/83 (!) 150/99 (!) 148/97  Pulse:  64 82 84  Resp:   18 18  Temp: 97.8 F (36.6 C)  97.8 F (36.6 C) 97.8 F (36.6 C)  TempSrc: Oral  Oral Axillary  SpO2: 97%  98% 98%  Weight:   73.9 kg     Intake/Output Summary (Last 24 hours) at 06/02/2020 0759 Last data filed at 06/02/2020 0522 Gross per 24 hour  Intake 1501.81 ml  Output 1300 ml  Net 201.81 ml   Filed Weights   05/31/20 0308 06/01/20 0343 06/02/20 0313  Weight: 76.4 kg 75 kg 73.9 kg    Examination:  General exam: Appears calm but uncomfortable  Respiratory system: Clear to auscultation. Respiratory effort normal. Cardiovascular system: S1 & S2+. No  rubs, gallops or clicks. Gastrointestinal system: Abdomen is nondistended, soft and nontender. Hyperactive bowel sounds heard. Central nervous system: Alert and oriented. Moves all 4 extremities  Psychiatry: Judgement and insight appear abnormal. Flat mood and affect     Data  Reviewed: I have personally reviewed following labs and imaging studies  CBC: Recent Labs  Lab 05/26/20 2024 05/27/20 0622 05/29/20 0039 05/30/20 0620 05/31/20 0620 06/01/20 0921 06/02/20 0550  WBC 9.4   < > 12.2* 8.9 7.5 8.7 7.8  NEUTROABS 8.1*  --   --   --   --   --  6.5  HGB 14.6   < > 10.9* 10.3* 12.0* 11.8* 12.3*  HCT 47.5   < > 35.4* 32.6* 38.5* 35.4* 37.6*  MCV 100.6*   < > 100.9* 98.8 98.5 94.7 96.9  PLT 269   < > 149* 149* 147* 157 160   < > = values in this interval not displayed.   Basic Metabolic Panel: Recent Labs  Lab 05/29/20 0039  05/29/20 1338 05/30/20 0620 05/30/20 1808 05/31/20 0620 06/01/20 0921 06/02/20 0550  NA 147*  --  145  --  139 139 138  K 3.3* 3.0* 3.1* 3.9 3.3* 3.3* 3.6  CL 112*  --  110  --  104 104 104  CO2 29  --  28  --  28 27 27   GLUCOSE 148*  --  129*  --  116* 121* 110*  BUN 23  --  14  --  10 8 9   CREATININE 0.79  --  0.75  --  0.75 0.85 0.70  CALCIUM 9.3  --  8.8*  --  8.5* 8.3* 8.2*  MG 1.9 2.1 2.0  --  1.8 2.0 1.9  PHOS <1.0* 1.2* 2.1* 2.5 1.4* 1.7* 2.3*   GFR: Estimated Creatinine Clearance: 80.8 mL/min (by C-G formula based on SCr of 0.7 mg/dL). Liver Function Tests: Recent Labs  Lab 05/26/20 2024 06/02/20 0550  AST 33 63*  ALT 24 42  ALKPHOS 53 54  BILITOT 1.3* 0.8  PROT 9.2* 6.2*  ALBUMIN 3.0* 2.0*   Recent Labs  Lab 05/26/20 2024  LIPASE 42   No results for input(s): AMMONIA in the last 168 hours. Coagulation Profile: Recent Labs  Lab 05/26/20 2230  INR 1.3*   Cardiac Enzymes: No results for input(s): CKTOTAL, CKMB, CKMBINDEX, TROPONINI in the last 168 hours. BNP (last 3 results) No results for input(s): PROBNP in the last 8760 hours. HbA1C: No results for input(s): HGBA1C in the last 72 hours. CBG: Recent Labs  Lab 06/01/20 1630 06/01/20 2028 06/02/20 0020 06/02/20 0519 06/02/20 0726  GLUCAP 124* 79 98 112* 101*   Lipid Profile: Recent Labs    06/02/20 0550  TRIG 79   Thyroid Function Tests: No results for input(s): TSH, T4TOTAL, FREET4, T3FREE, THYROIDAB in the last 72 hours. Anemia Panel: No results for input(s): VITAMINB12, FOLATE, FERRITIN, TIBC, IRON, RETICCTPCT in the last 72 hours. Sepsis Labs: Recent Labs  Lab 05/26/20 2024 05/26/20 2230 05/27/20 0622 05/28/20 0654 05/29/20 0039  PROCALCITON  --   --  0.25 2.58 1.62  LATICACIDVEN 3.0* 1.8  --   --   --     Recent Results (from the past 240 hour(s))  Blood culture (routine single)     Status: None   Collection Time: 05/26/20 10:30 PM   Specimen: BLOOD  Result Value Ref  Range Status   Specimen Description BLOOD RIGHT ANTECUBITAL  Final   Special Requests   Final    BOTTLES DRAWN AEROBIC AND ANAEROBIC Blood Culture results may not be optimal due to an inadequate volume of blood received in culture bottles   Culture   Final    NO GROWTH 5 DAYS Performed at  [facility], [address] [place], [place] [address]    Report Status 05/31/2020 FINAL  Final  Resp Panel by RT-PCR (Flu A&B, Covid) Nasopharyngeal Swab     Status: None   Collection Time: 05/27/20 12:37 AM   Specimen: Nasopharyngeal Swab; Nasopharyngeal(NP) swabs in vial transport medium  Result Value Ref Range Status   SARS Coronavirus 2 by RT PCR NEGATIVE NEGATIVE Final    Comment: (NOTE) SARS-CoV-2 target nucleic acids are NOT DETECTED.  The SARS-CoV-2 RNA is generally detectable in upper respiratory specimens during the acute phase of infection. The lowest concentration of SARS-CoV-2 viral copies this assay can detect is 138 copies/mL. A negative result does not preclude SARS-Cov-2 infection and should not be used as the sole basis for treatment or other patient management decisions. A negative result may occur with  improper specimen collection/handling, submission of specimen other than nasopharyngeal swab, presence of viral mutation(s) within the areas targeted by this assay, and inadequate number of viral copies(<138 copies/mL). A negative result must be combined with clinical observations, patient history, and epidemiological information. The expected result is Negative.  Fact Sheet for Patients:  EntrepreneurPulse.com.au  Fact Sheet for Healthcare Providers:  IncredibleEmployment.be  This test is no t yet approved or cleared by the [place] FDA and  has been authorized for detection and/or diagnosis of SARS-CoV-2 by FDA under an Emergency Use Authorization (EUA). This EUA will remain  in effect (meaning this test can be used) for  the duration of the COVID-19 declaration under Section 564(b)(1) of the Act, 21 U.S.C.section 360bbb-3(b)(1), unless the authorization is terminated  or revoked sooner.       Influenza A by PCR NEGATIVE NEGATIVE Final   Influenza B by PCR NEGATIVE NEGATIVE Final    Comment: (NOTE) The Xpert Xpress SARS-CoV-2/FLU/RSV plus assay is intended as an aid in the diagnosis of influenza from Nasopharyngeal swab specimens and should not be used as a sole basis for treatment. Nasal washings and aspirates are unacceptable for Xpert Xpress SARS-CoV-2/FLU/RSV testing.  Fact Sheet for Patients: EntrepreneurPulse.com.au  Fact Sheet for Healthcare Providers: IncredibleEmployment.be  This test is not yet approved or cleared by the [place] FDA and has been authorized for detection and/or diagnosis of SARS-CoV-2 by FDA under an Emergency Use Authorization (EUA). This EUA will remain in effect (meaning this test can be used) for the duration of the COVID-19 declaration under Section 564(b)(1) of the Act, 21 U.S.C. section 360bbb-3(b)(1), unless the authorization is terminated or revoked.  Performed at [facility], [address] [place], [place] [address]   MRSA PCR Screening     Status: None   Collection Time: 05/27/20  5:00 AM   Specimen: Nasopharyngeal  Result Value Ref Range Status   MRSA by PCR NEGATIVE NEGATIVE Final    Comment:        The GeneXpert MRSA Assay (FDA approved for NASAL specimens only), is one component of a comprehensive MRSA colonization surveillance program. It is not intended to diagnose MRSA infection nor to guide or monitor treatment for MRSA infections. Performed at [facility], [address] [place], [place] [address]   Urine culture     Status: None   Collection Time: 05/27/20  5:43 AM   Specimen: In/Out Cath Urine  Result Value Ref Range Status   Specimen Description   Final    IN/OUT  CATH URINE Performed at [facility], [address], [place],  [address]    Special Requests  Final    NONE Performed at [facility], [address], [place], [place] [address]    Culture   Final    NO GROWTH Performed at [facility], [place] [address] [place], [place] [address]    Report Status 05/28/2020 FINAL  Final         Radiology Studies: DG Chest Port 1 View  Result Date: 06/01/2020 CLINICAL DATA:  Central line placement EXAM: PORTABLE CHEST 1 VIEW COMPARISON:  05/27/2020, CT 04/24/2020 FINDINGS: Right upper extremity central venous catheter tip partially obscured by telemetry lead, it appears to project over the cavoatrial region. Endotracheal and esophageal tubes have been removed. Cardiomegaly with mild central congestion. Worsened consolidation at left lung base. Possible left pleural effusion. IMPRESSION: 1. Interval removal of endotracheal and esophageal tubes. Right upper extremity central venous catheter tip partially obscured by overlying telemetry leads, the tip appears to project over the cavoatrial region. 2. Cardiomegaly with vascular congestion. Worsened consolidation at the left lung base with possible left effusion. Electronically Signed   By: Donavan Foil M.D.   On: 06/01/2020 18:18   DG Abd Portable 1V  Result Date: 05/31/2020 CLINICAL DATA:  Abdominal pain.  Ileus. EXAM: PORTABLE ABDOMEN - 1 VIEW COMPARISON:  03/27/2020 FINDINGS: Moderate amount of gas throughout small and large bowel consistent with the clinical history of ileus. No focal dilatation to suggest obstruction. No sign of free air. Midline skin staples in the lower abdomen. IMPRESSION: Moderate amount of gas throughout small and large bowel consistent with the clinical history of ileus. Electronically Signed   By: Nelson Chimes M.D.   On: 05/31/2020 09:22   ECHOCARDIOGRAM COMPLETE  Result Date: 06/01/2020    ECHOCARDIOGRAM REPORT   Patient Name:   OZIL STETTLER  Farruggia Date of Exam: 06/01/2020 Medical Rec #:  [id number]            Height:       74.0 in Accession #:    [id number]           Weight:       165.3 lb Date of Birth:  [birth year]             BSA:          2.005 m Patient Age:    79 years             BP:           146/92 mmHg Patient Gender: M                    HR:           69 bpm. Exam Location:  [facility] Procedure: 2D Echo, Color Doppler and Cardiac Doppler Indications:     I48.92 Atrial Flutter  History:         Patient has no prior history of Echocardiogram examinations.                  TIA; Risk Factors:Hypertension and Dyslipidemia.  Sonographer:     Charmayne Sheer RDCS (AE) Referring Phys:  [id number] Rise Mu Diagnosing Phys: Nelva Bush MD  Sonographer Comments: Image acquisition challenging due to uncooperative patient. IMPRESSIONS  1. Left ventricular ejection fraction, by estimation, is 25 to 30%. The left ventricle has severely decreased function. The left ventricle demonstrates global hypokinesis. There is moderate left ventricular hypertrophy. Left ventricular diastolic parameters are indeterminate.  2. Right ventricular systolic function is mildly reduced. The right ventricular size is normal.  Tricuspid regurgitation signal is inadequate for assessing PA pressure.  3. Left atrial size was mildly dilated.  4. Right atrial size was mildly dilated.  5. A small pericardial effusion is present. The pericardial effusion is circumferential.  6. The mitral valve is normal in structure. Mild to moderate mitral valve regurgitation. There is mild holosystolic prolapse of of the mitral valve.  7. The aortic valve is tricuspid. Aortic valve regurgitation is not visualized. No aortic stenosis is present.  8. Aortic dilatation noted. There is mild dilatation of the aortic root, measuring 40 mm. FINDINGS  Left Ventricle: Left ventricular ejection fraction, by estimation, is 25 to 30%. The left ventricle has severely decreased function. The left ventricle demonstrates  global hypokinesis. The left ventricular internal cavity size was normal in size. There is moderate left ventricular hypertrophy. Left ventricular diastolic parameters are indeterminate. Right Ventricle: The right ventricular size is normal. No increase in right ventricular wall thickness. Right ventricular systolic function is mildly reduced. Tricuspid regurgitation signal is inadequate for assessing PA pressure. Left Atrium: Left atrial size was mildly dilated. Right Atrium: Right atrial size was mildly dilated. Pericardium: A small pericardial effusion is present. The pericardial effusion is circumferential. Mitral Valve: The mitral valve is normal in structure. There is mild holosystolic prolapse of of the mitral valve. Mild to moderate mitral valve regurgitation. MV peak gradient, 2.5 mmHg. The mean mitral valve gradient is 1.0 mmHg. Tricuspid Valve: The tricuspid valve is normal in structure. Tricuspid valve regurgitation is trivial. Aortic Valve: The aortic valve is tricuspid. Aortic valve regurgitation is not visualized. No aortic stenosis is present. Aortic valve mean gradient measures 2.0 mmHg. Aortic valve peak gradient measures 3.1 mmHg. Aortic valve area, by VTI measures 3.97 cm. Pulmonic Valve: The pulmonic valve was normal in structure. Pulmonic valve regurgitation is trivial. No evidence of pulmonic stenosis. Aorta: Aortic dilatation noted. There is mild dilatation of the aortic root, measuring 40 mm. Venous: The inferior vena cava was not well visualized. IAS/Shunts: No atrial level shunt detected by color flow Doppler.  LEFT VENTRICLE PLAX 2D LVIDd:         5.14 cm LVIDs:         4.60 cm LV PW:         1.44 cm LV IVS:        1.32 cm LVOT diam:     2.50 cm LV SV:         47 LV SV Index:   24 LVOT Area:     4.91 cm  LEFT ATRIUM           Index       RIGHT ATRIUM           Index LA diam:      4.40 cm 2.19 cm/m  RA Area:     18.87 cm 9.41 cm/m LA Vol (A4C): 71.2 ml 35.52 ml/m  AORTIC VALVE                    PULMONIC VALVE AV Area (Vmax):    3.53 cm    PV Vmax:       0.70 m/s AV Area (Vmean):   2.99 cm    PV Vmean:      48.000 cm/s AV Area (VTI):     3.97 cm    PV VTI:        0.131 m AV Vmax:           88.40 cm/s  PV Peak  grad:  2.0 mmHg AV Vmean:          60.900 cm/s PV Mean grad:  1.0 mmHg AV VTI:            0.119 m AV Peak Grad:      3.1 mmHg AV Mean Grad:      2.0 mmHg LVOT Vmax:         63.50 cm/s LVOT Vmean:        37.100 cm/s LVOT VTI:          0.096 m LVOT/AV VTI ratio: 0.81  AORTA Ao Root diam: 4.00 cm MITRAL VALVE MV Area (PHT): 5.26 cm    SHUNTS MV Peak grad:  2.5 mmHg    Systemic VTI:  0.10 m MV Mean grad:  1.0 mmHg    Systemic Diam: 2.50 cm MV Vmax:       0.78 m/s MV Vmean:      46.0 cm/s MV Decel Time: 144 msec MV E velocity: 80.20 cm/s Nelva Bush MD Electronically signed by Nelva Bush MD Signature Date/Time: 06/01/2020/12:23:56 PM    Final    [place] EKG SITE RITE  Result Date: 06/01/2020 If Site Rite image not attached, placement could not be confirmed due to current cardiac rhythm.       Scheduled Meds: . bisacodyl  10 mg Rectal q morning - 10a  . Chlorhexidine Gluconate Cloth  6 each Topical Daily  . enoxaparin (LOVENOX) injection  40 mg Subcutaneous Q24H  . insulin aspart  0-9 Units Subcutaneous Q8H  . mouth rinse  15 mL Mouth Rinse BID  . metoprolol tartrate  5 mg Intravenous Q6H  . pantoprazole (PROTONIX) IV  40 mg Intravenous Daily  . sodium chloride flush  10-40 mL Intracatheter Q12H   Continuous Infusions: . sodium chloride Stopped (06/01/20 0630)  . lactated ringers 70 mL/hr at 06/02/20 0522  . meropenem (MERREM) IV 1 g (06/02/20 0546)  . TPN ADULT (ION) 30 mL/hr at 06/02/20 0522     LOS: 6 days    Time spent: 33 mins    Wyvonnia Dusky, MD [facility] Pager [phone]  If 7PM-7AM, please contact night-coverage 06/02/2020, 7:59 AM

## 2020-06-03 DIAGNOSIS — K567 Ileus, unspecified: Secondary | ICD-10-CM

## 2020-06-03 DIAGNOSIS — I25118 Atherosclerotic heart disease of native coronary artery with other forms of angina pectoris: Secondary | ICD-10-CM

## 2020-06-03 DIAGNOSIS — R1084 Generalized abdominal pain: Secondary | ICD-10-CM

## 2020-06-03 LAB — COMPREHENSIVE METABOLIC PANEL
ALT: 42 U/L (ref 0–44)
AST: 54 U/L — ABNORMAL HIGH (ref 15–41)
Albumin: 2 g/dL — ABNORMAL LOW (ref 3.5–5.0)
Alkaline Phosphatase: 59 U/L (ref 38–126)
Anion gap: 6 (ref 5–15)
BUN: 11 mg/dL (ref 8–23)
CO2: 26 mmol/L (ref 22–32)
Calcium: 8.2 mg/dL — ABNORMAL LOW (ref 8.9–10.3)
Chloride: 103 mmol/L (ref 98–111)
Creatinine, Ser: 0.71 mg/dL (ref 0.61–1.24)
GFR, Estimated: >60 mL/min (ref >60)
Glucose, Bld: 106 mg/dL — ABNORMAL HIGH (ref 70–99)
Potassium: 3.8 mmol/L (ref 3.5–5.1)
Sodium: 135 mmol/L (ref 135–145)
Total Bilirubin: 0.6 mg/dL (ref 0.3–1.2)
Total Protein: 6.3 g/dL — ABNORMAL LOW (ref 6.5–8.1)

## 2020-06-03 LAB — GLUCOSE, CAPILLARY
Glucose-Capillary: 105 mg/dL — ABNORMAL HIGH (ref 70–99)
Glucose-Capillary: 110 mg/dL — ABNORMAL HIGH (ref 70–99)
Glucose-Capillary: 115 mg/dL — ABNORMAL HIGH (ref 70–99)
Glucose-Capillary: 122 mg/dL — ABNORMAL HIGH (ref 70–99)
Glucose-Capillary: 96 mg/dL (ref 70–99)
Glucose-Capillary: 97 mg/dL (ref 70–99)

## 2020-06-03 LAB — MAGNESIUM: Magnesium: 1.9 mg/dL (ref 1.7–2.4)

## 2020-06-03 LAB — PHOSPHORUS: Phosphorus: 2 mg/dL — ABNORMAL LOW (ref 2.5–4.6)

## 2020-06-03 MED ORDER — ENSURE ENLIVE PO LIQD
237.0000 mL | Freq: Three times a day (TID) | ORAL | Status: DC
  Administered 2020-06-03 – 2020-06-15 (×31): 237 mL via ORAL

## 2020-06-03 MED ORDER — POTASSIUM & SODIUM PHOSPHATES 280-160-250 MG PO PACK
2.0000 | PACK | ORAL | Status: AC
  Administered 2020-06-03 (×2): 2 via ORAL
  Filled 2020-06-03 (×4): qty 2

## 2020-06-03 MED ORDER — TRAVASOL 10 % IV SOLN
INTRAVENOUS | Status: AC
  Filled 2020-06-03: qty 499.2

## 2020-06-03 MED ORDER — LACTATED RINGERS IV SOLN: INTRAVENOUS | Status: AC

## 2020-06-03 MED ORDER — ENSURE ENLIVE PO LIQD: 237.0000 mL | Freq: Two times a day (BID) | ORAL | Status: DC

## 2020-06-03 MED ORDER — ADULT MULTIVITAMIN W/MINERALS CH
1.0000 | ORAL_TABLET | Freq: Every day | ORAL | Status: DC
  Administered 2020-06-04 – 2020-06-15 (×12): 1 via ORAL
  Filled 2020-06-03 (×12): qty 1

## 2020-06-03 NOTE — Progress Notes (Signed)
Progress Note  Patient Name: Craig [name] Date of Encounter: 06/03/2020  Primary Cardiologist: Formerly Dr. Clayborn Bigness - [facility]  Subjective   Alert, though remains confused. Denies chest pain, dyspnea, or palpitations. Had a recorded BM.   Inpatient Medications    Scheduled Meds: . bisacodyl  10 mg Rectal q morning - 10a  . Chlorhexidine Gluconate Cloth  6 each Topical Daily  . enoxaparin (LOVENOX) injection  40 mg Subcutaneous Q24H  . insulin aspart  0-9 Units Subcutaneous Q8H  . mouth rinse  15 mL Mouth Rinse BID  . metoprolol tartrate  5 mg Intravenous Q6H  . pantoprazole (PROTONIX) IV  40 mg Intravenous Daily  . sodium chloride flush  10-40 mL Intracatheter Q12H   Continuous Infusions: . sodium chloride Stopped (06/01/20 0630)  . lactated ringers 40 mL/hr at 06/03/20 0016  . TPN ADULT (ION) 60 mL/hr at 06/03/20 0259   PRN Meds: acetaminophen **OR** acetaminophen, LORazepam **OR** LORazepam, metoprolol tartrate, ondansetron **OR** ondansetron (ZOFRAN) IV, sodium chloride flush, sodium phosphate   Vital Signs    Vitals:   06/02/20 1127 06/02/20 1624 06/02/20 1927 06/03/20 0611  BP: 110/60 129/72 130/78 (!) 155/87  Pulse: 65 66 64 85  Resp: 17 17 17    Temp: 98.1 F (36.7 C) 98.1 F (36.7 C) 98 F (36.7 C) 98 F (36.7 C)  TempSrc: Axillary Axillary Oral Oral  SpO2: 100% 100% 100% 100%  Weight:    76.5 kg    Intake/Output Summary (Last 24 hours) at 06/03/2020 0727 Last data filed at 06/03/2020 0450 Gross per 24 hour  Intake 1718.92 ml  Output 1775 ml  Net -56.08 ml   Filed Weights   06/01/20 0343 06/02/20 0313 06/03/20 0611  Weight: 75 kg 73.9 kg 76.5 kg    Telemetry    Atrial flutter with variable AV block with controlled ventricular response in the 70s to 80s bpm, short run of aberrancy vs NSVT  - Personally Reviewed  ECG    No new tracings - Personally Reviewed  Physical Exam   GEN: Elderly and frail appearing; No acute distress.    Neck: No JVD. Cardiac: Irregular, no murmurs, rubs, or gallops.  Respiratory: Clear to auscultation bilaterally.  GI: Soft, nontender, non-distended.   MS: No edema; No deformity. Neuro:  Alert and oriented to person only; Nonfocal.  Psych: Normal affect.  Labs    Chemistry Recent Labs  Lab 05/31/20 0620 06/01/20 0921 06/02/20 0550  NA 139 139 138  K 3.3* 3.3* 3.6  CL 104 104 104  CO2 28 27 27   GLUCOSE 116* 121* 110*  BUN 10 8 9   CREATININE 0.75 0.85 0.70  CALCIUM 8.5* 8.3* 8.2*  PROT  --   --  6.2*  ALBUMIN  --   --  2.0*  AST  --   --  63*  ALT  --   --  42  ALKPHOS  --   --  54  BILITOT  --   --  0.8  GFRNONAA >60 >60 >60  ANIONGAP 7 8 7      Hematology Recent Labs  Lab 05/31/20 0620 06/01/20 0921 06/02/20 0550  WBC 7.5 8.7 7.8  RBC 3.91* 3.74* 3.88*  HGB 12.0* 11.8* 12.3*  HCT 38.5* 35.4* 37.6*  MCV 98.5 94.7 96.9  MCH 30.7 31.6 31.7  MCHC 31.2 33.3 32.7  RDW 12.4 12.5 12.5  PLT 147* 157 160    Cardiac EnzymesNo results for input(s): TROPONINI in the last 168 hours.  No results for input(s): TROPIPOC in the last 168 hours.   BNP No results for input(s): BNP, PROBNP in the last 168 hours.   DDimer No results for input(s): DDIMER in the last 168 hours.   Radiology    DG Abd Portable 1V  Result Date: 05/31/2020 IMPRESSION: Moderate amount of gas throughout small and large bowel consistent with the clinical history of ileus. Electronically Signed   By: Nelson Chimes M.D.   On: 05/31/2020 09:22    Cardiac Studies   2D echo pending  Patient Profile     79 y.o. male with history of CAD with remote MI, chronic atrial flutter, TIA, PVD, HTN, and HLD admitted with abdominal pain and was found to have an incarcerated right inguinal hernia s/p surgical repair with post operative course complicated with SOB who we are seeing of chronic atrial flutter with RVR.  Assessment & Plan    1. 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: -His  atrial flutter dates back at least to 2017 per chart review -Has had documented atrial flutter with slow ventricular response in the 30s bpm on EKG 04/20/2020 -Currently in atrial flutter with variable AV block with controlled ventricular response with an episode of aberrancy vs NSVT noted on telemetry  -CHADS2VASc 6 (HTN, age x 2, vascular disease, TIA x 2) -Previously on Xarelto, though not noted on PTA medications with details uncertain -He is currently not oriented and it would not likely be an anticoagulation candidate if he remains in his current state, possibly even long term -He did have a documented BM with surgery recommending advancing his diet as tolerated with weaning of TPN -Diltiazem gtt stopped 12/13 and should be avoided moving forward given his cardiomyopathy  -Rate control with IV metoprolol until he is regularly tolerating oral diet/medications, possibly 12/17, watch for bradycardic rates given history -If needed, digoxin could be used judiciously  -When able to take oral medications, recommend transitioning to Toprol XL or Coreg as outlined below -Echo as above -Recent TSH normal -Magnesium normal -Potassium 3.6 -Recommend he follow up with EP for consideration of further management of his tachy-brady syndrome with chronic atrial flutter  2. CAD involving the native coronary arteries: -No symptoms concerning for angina -HS-Tn 54 x 2, not consistent with ACS -Echo as above -ASA when able, if not found to be an anticoagulation candidate  -Outpatient follow up as outlined below   3. HFrEF with RV dysfunction: -He does not appear to be grossly volume overloaded -Will need to be cautious with IV fluids with his cardiomyopathy  -Maintain a net even fluid balance -Continue IV metoprolol for rate control, when able, transition to Toprol XL or Coreg given his cardiomyopathy  -When able to take oral medications consistently recommend escalation of GDMT as tolerated with  step-wise addition of ACEi/ARB/ARNI/MRA as able -Diltiazem should be avoided with his cardiomyopathy -As his mental status improves, possible ischemic testing will need to be considered given his cardiomyopathy, however, this is not currently an option   4. Hypokalemia: -Improving -Scheduled for potassium today   5. Hypoalbuminemia: -Advancing diet  -TPN per surgery    For questions or updates, please contact [facility] Please consult [email or web address] for contact info under Cardiology/STEMI.    Signed, Christell Faith, PA-C [place] Pager: [phone] 06/03/2020, 7:27 AM

## 2020-06-03 NOTE — Consult Note (Signed)
PHARMACY - TOTAL PARENTERAL NUTRITION CONSULT NOTE   Indication: Prolonged ileus  Patient Measurements: Weight: 76.5 kg (168 lb 10.4 oz)   Body mass index is 21.65 kg/m. 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 Patient subsequently found to have incarcerated right inguinal hernia with strangulated (gangrenous) knuckle of small bowel with perforation, SBO, and right spermatic cord abscess. Pt underwent exploratory laparotomy, right inguinal hernia repair, small bowel resection, and orchiectomy on 12/9. Pt had absent bowel sounds prevously, however small bowel sounds heard so pt is currently on clear, liquid diet. No nutritional support at this time. Pharmacy consulted to initiate TPN for prolonged ileus.   12/16: pt Surgery reported pt diet advancing, now on full liquids; reportedly tolerating without issues. He is having bowel function and had recorded BM. Will begin weaning TPN today; once tolerating soft/regular diet, Surgery recommends discontinuing  Glucose / Insulin: No history of diabetes. Blood glucose < 180 - 1 unit insulin required last 24 h  Electrolytes: Hypokalemia resolved, hypophosphatemia 2.3>2.0, hypomagnesemia resolved Renal: Scr < 1 (stable) LFTs / TGs: LFTs WNL, TG 79 Prealbumin / albumin: Prealbumin 7.4 12/15, albumin 3 12/8 Intake / Output; MIVF: +6.6 L this admission. MIVF = LR @ 40 mL/hr GI Imaging:  12/8 CT abdomen concerning for incarcerated inguinal hernia 12/13 Abdominal X-ray concerning for ileus Surgeries / Procedures:  12/9 exploratory laparotomy, right inguinal hernia repair, small bowel resection, and orchiectomyfor strangulated right inguinal hernia with right spermatic cord abscess  Central access: PICC placed 12/14 TPN start date: 12/14 @ 1800  Nutritional Goals (per RD recommendation on 12/14): kCal:  2000-2200, Protein: 100-110 grams, Fluid: 1.8L Goal TPN rate is 90 mL/hr (provides 112.3 g of protein and 2080 kcals per day)  Current Nutrition:  Clear, liquid diet 12/16: full liquids  Plan:  -Given lipid emulsion shortage, will reduce lipid content in TPN to target 15% of total daily kCals from [employer] instead of 30% to align with guidance put forth. AA and dextrose content increased to meet calorie requirements. Will continue to monitor. -Decrease TPN to 48mL/hr (44% goal TPN rate) at 1800 -Electrolytes in TPN: 16mEq/L of Na, 33mEq/L of K, 43mEq/L of Ca, 38mEq/L of Mg, and 72mmol/L of -Phos. Cl:Ac ratio 1:1  K 3.8, phos 2.0 - ordered Phos-Nak 2 packets q4h x 4 doses (contains 56.8 mEq K) -Add standard MVI and trace elements to TPN -Initiate Sensitive q8h SSI and adjust as needed  -Increase MIVF to 60 mL/hr at 1800 -Monitor TPN labs on Mon/Thurs, daily until stable electrolytes  Benn Moulder, PharmD Pharmacy Resident  06/03/2020 9:36 AM

## 2020-06-03 NOTE — Progress Notes (Addendum)
PROGRESS NOTE    Craig [name]  ASN:[id number] DOB: July 01, 1940 DOA: 05/26/2020 PCP: [name], MD    Assessment & Plan:   Principal Problem:   SBO (small bowel obstruction) ([facility]) Active Problems:   CAD (coronary artery disease), native coronary artery   History of coronary artery stent placement   Essential hypertension   Dementia with behavioral disturbance (HCC)   Atrial flutter with rapid ventricular response (HCC)   Hypernatremia   Nicotine dependence   HFrEF (heart failure with reduced ejection fraction) ([place])   Incarcerated right inguinal hernia: w/ strangulated(gangrenous)knuckle of small bowelwith perforation, small bowel obstruction and right spermatic cord abscess. Continue w/ NPO. Start to wean TPN as per surg. Continue on IVFs as per surg. Completed course of abxs  A. fib /flutter: w/ RVR. Possibly PAF. Continue on IV metoprolol while NPO. Not a good candidate for anticoagulation secondary to above   Likely acute on chronic systolic CHF: echo shows LP37-90%, LV demonstrates global hypokinesis & diastolic parameters are indeterminate. Continue on IV metoprolol while NPO. May need ischemic work-up as per cardio   Acute metabolic encephalopathy: w/ hx of dementia w/ behavioral disturbance. Re-orient prn   Elevated troponin: likely secondary to demand ischemia. May need ischemic work-up as per cardio  Hypokalemia: within normal limits today   Hypophosphatemia: replete as per pharmacy   Dementia-: will restart donepezil tomorrow if tolerating a soft diet   DVT prophylaxis: lovenox  Code Status: full  Family Communication: called pt's daughter, Roselyn Reef, no answer so I left a message  Disposition Plan: PT/OT recs SNF    Status is: Inpatient  Remains inpatient appropriate because:Ongoing diagnostic testing needed not appropriate for outpatient work up, Unsafe d/c plan, IV treatments appropriate due to intensity of illness or inability to take PO and  Inpatient level of care appropriate due to severity of illness   Dispo: The patient is from: Home              Anticipated d/c is to: SNF              Anticipated d/c date is: > 3 days              Patient currently is not medically stable to d/c.         Consultants:   Cardio  General surg    Procedures:    Antimicrobials:   Subjective: Pt c/o fatigue   Objective: Vitals:   06/02/20 1127 06/02/20 1624 06/02/20 1927 06/03/20 0611  BP: 110/60 129/72 130/78 (!) 155/87  Pulse: 65 66 64 85  Resp: 17 17 17    Temp: 98.1 F (36.7 C) 98.1 F (36.7 C) 98 F (36.7 C) 98 F (36.7 C)  TempSrc: Axillary Axillary Oral Oral  SpO2: 100% 100% 100% 100%  Weight:    76.5 kg    Intake/Output Summary (Last 24 hours) at 06/03/2020 0745 Last data filed at 06/03/2020 0450 Gross per 24 hour  Intake 1718.92 ml  Output 1775 ml  Net -56.08 ml   Filed Weights   06/01/20 0343 06/02/20 0313 06/03/20 0611  Weight: 75 kg 73.9 kg 76.5 kg    Examination:  General exam: Appears agitated and frustrated Respiratory system: clear breath sounds b/l. No wheezes, rales Cardiovascular system: S1 & S2+. No  rubs, gallops or clicks. Gastrointestinal system: Abdomen is nondistended, soft and nontender. Normal bowel sounds  Central nervous system: Alert and oriented to person only.  Psychiatry: Judgement and insight appear abnormal. Agitated and  frustrated     Data Reviewed: I have personally reviewed following labs and imaging studies  CBC: Recent Labs  Lab 05/29/20 0039 05/30/20 0620 05/31/20 0620 06/01/20 0921 06/02/20 0550  WBC 12.2* 8.9 7.5 8.7 7.8  NEUTROABS  --   --   --   --  6.5  HGB 10.9* 10.3* 12.0* 11.8* 12.3*  HCT 35.4* 32.6* 38.5* 35.4* 37.6*  MCV 100.9* 98.8 98.5 94.7 96.9  PLT 149* 149* 147* 157 086   Basic Metabolic Panel: Recent Labs  Lab 05/30/20 0620 05/30/20 1808 05/31/20 0620 06/01/20 0921 06/02/20 0550 06/03/20 0622  NA 145  --  139 139 138 135   K 3.1* 3.9 3.3* 3.3* 3.6 3.8  CL 110  --  104 104 104 103  CO2 28  --  28 27 27 26   GLUCOSE 129*  --  116* 121* 110* 106*  BUN 14  --  10 8 9 11   CREATININE 0.75  --  0.75 0.85 0.70 0.71  CALCIUM 8.8*  --  8.5* 8.3* 8.2* 8.2*  MG 2.0  --  1.8 2.0 1.9 1.9  PHOS 2.1* 2.5 1.4* 1.7* 2.3* 2.0*   GFR: Estimated Creatinine Clearance: 83.7 mL/min (by C-G formula based on SCr of 0.71 mg/dL). Liver Function Tests: Recent Labs  Lab 06/02/20 0550 06/03/20 0622  AST 63* 54*  ALT 42 42  ALKPHOS 54 59  BILITOT 0.8 0.6  PROT 6.2* 6.3*  ALBUMIN 2.0* 2.0*   No results for input(s): LIPASE, AMYLASE in the last 168 hours. No results for input(s): AMMONIA in the last 168 hours. Coagulation Profile: No results for input(s): INR, PROTIME in the last 168 hours. Cardiac Enzymes: No results for input(s): CKTOTAL, CKMB, CKMBINDEX, TROPONINI in the last 168 hours. BNP (last 3 results) No results for input(s): PROBNP in the last 8760 hours. HbA1C: No results for input(s): HGBA1C in the last 72 hours. CBG: Recent Labs  Lab 06/02/20 0726 06/02/20 1128 06/02/20 1622 06/02/20 2102 06/03/20 0018  GLUCAP 101* 128* 143* 103* 96   Lipid Profile: Recent Labs    06/02/20 0550  TRIG 79   Thyroid Function Tests: No results for input(s): TSH, T4TOTAL, FREET4, T3FREE, THYROIDAB in the last 72 hours. Anemia Panel: No results for input(s): VITAMINB12, FOLATE, FERRITIN, TIBC, IRON, RETICCTPCT in the last 72 hours. Sepsis Labs: Recent Labs  Lab 05/28/20 0654 05/29/20 0039  PROCALCITON 2.58 1.62    Recent Results (from the past 240 hour(s))  Blood culture (routine single)     Status: None   Collection Time: 05/26/20 10:30 PM   Specimen: BLOOD  Result Value Ref Range Status   Specimen Description BLOOD RIGHT ANTECUBITAL  Final   Special Requests   Final    BOTTLES DRAWN AEROBIC AND ANAEROBIC Blood Culture results may not be optimal due to an inadequate volume of blood received in culture  bottles   Culture   Final    NO GROWTH 5 DAYS Performed at [facility], [address] [place], [place] [address]    Report Status 05/31/2020 FINAL  Final  Resp Panel by RT-PCR (Flu A&B, Covid) Nasopharyngeal Swab     Status: None   Collection Time: 05/27/20 12:37 AM   Specimen: Nasopharyngeal Swab; Nasopharyngeal(NP) swabs in vial transport medium  Result Value Ref Range Status   SARS Coronavirus 2 by RT PCR NEGATIVE NEGATIVE Final    Comment: (NOTE) SARS-CoV-2 target nucleic acids are NOT DETECTED.  The SARS-CoV-2 RNA is generally detectable in  upper respiratory specimens during the acute phase of infection. The lowest concentration of SARS-CoV-2 viral copies this assay can detect is 138 copies/mL. A negative result does not preclude SARS-Cov-2 infection and should not be used as the sole basis for treatment or other patient management decisions. A negative result may occur with  improper specimen collection/handling, submission of specimen other than nasopharyngeal swab, presence of viral mutation(s) within the areas targeted by this assay, and inadequate number of viral copies(<138 copies/mL). A negative result must be combined with clinical observations, patient history, and epidemiological information. The expected result is Negative.  Fact Sheet for Patients:  EntrepreneurPulse.com.au  Fact Sheet for Healthcare Providers:  IncredibleEmployment.be  This test is no t yet approved or cleared by the [place] FDA and  has been authorized for detection and/or diagnosis of SARS-CoV-2 by FDA under an Emergency Use Authorization (EUA). This EUA will remain  in effect (meaning this test can be used) for the duration of the COVID-19 declaration under Section 564(b)(1) of the Act, 21 U.S.C.section 360bbb-3(b)(1), unless the authorization is terminated  or revoked sooner.       Influenza A by PCR NEGATIVE NEGATIVE Final    Influenza B by PCR NEGATIVE NEGATIVE Final    Comment: (NOTE) The Xpert Xpress SARS-CoV-2/FLU/RSV plus assay is intended as an aid in the diagnosis of influenza from Nasopharyngeal swab specimens and should not be used as a sole basis for treatment. Nasal washings and aspirates are unacceptable for Xpert Xpress SARS-CoV-2/FLU/RSV testing.  Fact Sheet for Patients: EntrepreneurPulse.com.au  Fact Sheet for Healthcare Providers: IncredibleEmployment.be  This test is not yet approved or cleared by the [place] FDA and has been authorized for detection and/or diagnosis of SARS-CoV-2 by FDA under an Emergency Use Authorization (EUA). This EUA will remain in effect (meaning this test can be used) for the duration of the COVID-19 declaration under Section 564(b)(1) of the Act, 21 U.S.C. section 360bbb-3(b)(1), unless the authorization is terminated or revoked.  Performed at [facility], [address] [place], [place] [address]   MRSA PCR Screening     Status: None   Collection Time: 05/27/20  5:00 AM   Specimen: Nasopharyngeal  Result Value Ref Range Status   MRSA by PCR NEGATIVE NEGATIVE Final    Comment:        The GeneXpert MRSA Assay (FDA approved for NASAL specimens only), is one component of a comprehensive MRSA colonization surveillance program. It is not intended to diagnose MRSA infection nor to guide or monitor treatment for MRSA infections. Performed at [facility], [address] [place], [place] [address]   Urine culture     Status: None   Collection Time: 05/27/20  5:43 AM   Specimen: In/Out Cath Urine  Result Value Ref Range Status   Specimen Description   Final    IN/OUT CATH URINE Performed at [facility], [address] [place], [place] [address]    Special Requests   Final    NONE Performed at [facility], [address] [place],  [address]    Culture    Final    NO GROWTH Performed at [facility], [place] [address] [place],  [address]    Report Status 05/28/2020 FINAL  Final         Radiology Studies: DG Chest Port 1 View  Result Date: 06/01/2020 CLINICAL DATA:  Central line placement EXAM: PORTABLE CHEST 1 VIEW COMPARISON:  05/27/2020, CT 04/24/2020 FINDINGS: Right  upper extremity central venous catheter tip partially obscured by telemetry lead, it appears to project over the cavoatrial region. Endotracheal and esophageal tubes have been removed. Cardiomegaly with mild central congestion. Worsened consolidation at left lung base. Possible left pleural effusion. IMPRESSION: 1. Interval removal of endotracheal and esophageal tubes. Right upper extremity central venous catheter tip partially obscured by overlying telemetry leads, the tip appears to project over the cavoatrial region. 2. Cardiomegaly with vascular congestion. Worsened consolidation at the left lung base with possible left effusion. Electronically Signed   By: Donavan Foil M.D.   On: 06/01/2020 18:18   ECHOCARDIOGRAM COMPLETE  Result Date: 06/01/2020    ECHOCARDIOGRAM REPORT   Patient Name:   MELODY SAVIDGE Vandermeulen Date of Exam: 06/01/2020 Medical Rec #:  [id number]            Height:       74.0 in Accession #:    [id number]           Weight:       165.3 lb Date of Birth:  [birth year]             BSA:          2.005 m Patient Age:    65 years             BP:           146/92 mmHg Patient Gender: M                    HR:           69 bpm. Exam Location:  [facility] Procedure: 2D Echo, Color Doppler and Cardiac Doppler Indications:     I48.92 Atrial Flutter  History:         Patient has no prior history of Echocardiogram examinations.                  TIA; Risk Factors:Hypertension and Dyslipidemia.  Sonographer:     Charmayne Sheer RDCS (AE) Referring Phys:  [id number] Rise Mu Diagnosing Phys: Nelva Bush MD  Sonographer Comments: Image acquisition challenging due to uncooperative  patient. IMPRESSIONS  1. Left ventricular ejection fraction, by estimation, is 25 to 30%. The left ventricle has severely decreased function. The left ventricle demonstrates global hypokinesis. There is moderate left ventricular hypertrophy. Left ventricular diastolic parameters are indeterminate.  2. Right ventricular systolic function is mildly reduced. The right ventricular size is normal. Tricuspid regurgitation signal is inadequate for assessing PA pressure.  3. Left atrial size was mildly dilated.  4. Right atrial size was mildly dilated.  5. A small pericardial effusion is present. The pericardial effusion is circumferential.  6. The mitral valve is normal in structure. Mild to moderate mitral valve regurgitation. There is mild holosystolic prolapse of of the mitral valve.  7. The aortic valve is tricuspid. Aortic valve regurgitation is not visualized. No aortic stenosis is present.  8. Aortic dilatation noted. There is mild dilatation of the aortic root, measuring 40 mm. FINDINGS  Left Ventricle: Left ventricular ejection fraction, by estimation, is 25 to 30%. The left ventricle has severely decreased function. The left ventricle demonstrates global hypokinesis. The left ventricular internal cavity size was normal in size. There is moderate left ventricular hypertrophy. Left ventricular diastolic parameters are indeterminate. Right Ventricle: The right ventricular size is normal. No increase in right ventricular wall thickness. Right ventricular systolic function is mildly reduced. Tricuspid regurgitation signal is inadequate for assessing PA pressure. Left Atrium: Left  atrial size was mildly dilated. Right Atrium: Right atrial size was mildly dilated. Pericardium: A small pericardial effusion is present. The pericardial effusion is circumferential. Mitral Valve: The mitral valve is normal in structure. There is mild holosystolic prolapse of of the mitral valve. Mild to moderate mitral valve regurgitation.  MV peak gradient, 2.5 mmHg. The mean mitral valve gradient is 1.0 mmHg. Tricuspid Valve: The tricuspid valve is normal in structure. Tricuspid valve regurgitation is trivial. Aortic Valve: The aortic valve is tricuspid. Aortic valve regurgitation is not visualized. No aortic stenosis is present. Aortic valve mean gradient measures 2.0 mmHg. Aortic valve peak gradient measures 3.1 mmHg. Aortic valve area, by VTI measures 3.97 cm. Pulmonic Valve: The pulmonic valve was normal in structure. Pulmonic valve regurgitation is trivial. No evidence of pulmonic stenosis. Aorta: Aortic dilatation noted. There is mild dilatation of the aortic root, measuring 40 mm. Venous: The inferior vena cava was not well visualized. IAS/Shunts: No atrial level shunt detected by color flow Doppler.  LEFT VENTRICLE PLAX 2D LVIDd:         5.14 cm LVIDs:         4.60 cm LV PW:         1.44 cm LV IVS:        1.32 cm LVOT diam:     2.50 cm LV SV:         47 LV SV Index:   24 LVOT Area:     4.91 cm  LEFT ATRIUM           Index       RIGHT ATRIUM           Index LA diam:      4.40 cm 2.19 cm/m  RA Area:     18.87 cm 9.41 cm/m LA Vol (A4C): 71.2 ml 35.52 ml/m  AORTIC VALVE                   PULMONIC VALVE AV Area (Vmax):    3.53 cm    PV Vmax:       0.70 m/s AV Area (Vmean):   2.99 cm    PV Vmean:      48.000 cm/s AV Area (VTI):     3.97 cm    PV VTI:        0.131 m AV Vmax:           88.40 cm/s  PV Peak grad:  2.0 mmHg AV Vmean:          60.900 cm/s PV Mean grad:  1.0 mmHg AV VTI:            0.119 m AV Peak Grad:      3.1 mmHg AV Mean Grad:      2.0 mmHg LVOT Vmax:         63.50 cm/s LVOT Vmean:        37.100 cm/s LVOT VTI:          0.096 m LVOT/AV VTI ratio: 0.81  AORTA Ao Root diam: 4.00 cm MITRAL VALVE MV Area (PHT): 5.26 cm    SHUNTS MV Peak grad:  2.5 mmHg    Systemic VTI:  0.10 m MV Mean grad:  1.0 mmHg    Systemic Diam: 2.50 cm MV Vmax:       0.78 m/s MV Vmean:      46.0 cm/s MV Decel Time: 144 msec MV E velocity: 80.20 cm/s  Nelva Bush MD Electronically signed by Nelva Bush  MD Signature Date/Time: 06/01/2020/12:23:56 PM    Final    [place] EKG SITE RITE  Result Date: 06/01/2020 If Site Rite image not attached, placement could not be confirmed due to current cardiac rhythm.       Scheduled Meds: . bisacodyl  10 mg Rectal q morning - 10a  . Chlorhexidine Gluconate Cloth  6 each Topical Daily  . enoxaparin (LOVENOX) injection  40 mg Subcutaneous Q24H  . insulin aspart  0-9 Units Subcutaneous Q8H  . mouth rinse  15 mL Mouth Rinse BID  . metoprolol tartrate  5 mg Intravenous Q6H  . pantoprazole (PROTONIX) IV  40 mg Intravenous Daily  . sodium chloride flush  10-40 mL Intracatheter Q12H   Continuous Infusions: . sodium chloride Stopped (06/01/20 0630)  . lactated ringers 40 mL/hr at 06/03/20 0016  . TPN ADULT (ION) 60 mL/hr at 06/03/20 0259     LOS: 7 days    Time spent: 30 mins    Wyvonnia Dusky, MD [facility] Pager [phone]  If 7PM-7AM, please contact night-coverage 06/03/2020, 7:45 AM

## 2020-06-03 NOTE — Progress Notes (Signed)
[place] Hospital Day(s): 7.   Post op day(s): 7 Days Post-Op.   Interval History:  Patient seen and examined no acute events or new complaints overnight, Patient not most reliable historian given mental status, but is much more alert and interactive this morning He denied any complaints, no abdominal pain, nausea, emesis sCr remains normal at 0.71, UO - 1.7L + unmeasured Mild hypophosphatemia to 2.0; o/w no significant electrolyte derangements  Diet advancing, now on full liquids; reportedly tolerating without issues He is having bowel function and had recorded BM Worked with PT; recommending SNF  Vital signs in last 24 hours: [min-max] current  Temp:  [98 F (36.7 C)-98.1 F (36.7 C)] 98 F (36.7 C) (12/16 0611) Pulse Rate:  [64-85] 85 (12/16 0611) Resp:  [17] 17 (12/15 1927) BP: (110-155)/(60-87) 155/87 (12/16 0611) SpO2:  [100 %] 100 % (12/16 0611) Weight:  [76.5 kg] 76.5 kg (12/16 0611)       Weight: 76.5 kg BMI (Calculated): 21.64   Intake/Output last 2 shifts:  12/15 0701 - 12/16 0700 In: 1718.9 [P.O.:1200; I.V.:518.9] Out: 9242 [Urine:1775]   Physical Exam:  Constitutional: alert, cooperative and no distress Respiratory: breathing non-labored at rest  Cardiovascular: regular rate and rhythm  Gastrointestinal: Soft, no appreciable tenderness, he does not appear distended, no rebound or guarding Integumentary: Right inguinal and lower midline incisions are both CDI with staples, no drainage this morning, incisions both remain without erythema  Labs:  CBC Latest Ref Rng & Units 06/02/2020 06/01/2020 05/31/2020  WBC 4.0 - 10.5 K/uL 7.8 8.7 7.5  Hemoglobin 13.0 - 17.0 g/dL 12.3(L) 11.8(L) 12.0(L)  Hematocrit 39.0 - 52.0 % 37.6(L) 35.4(L) 38.5(L)  Platelets 150 - 400 K/uL 160 157 147(L)   CMP Latest Ref Rng & Units 06/02/2020 06/01/2020 05/31/2020  Glucose 70 - 99 mg/dL 110(H) 121(H) 116(H)  BUN 8 - 23 mg/dL 9 8 10    Creatinine 0.61 - 1.24 mg/dL 0.70 0.85 0.75  Sodium 135 - 145 mmol/L 138 139 139  Potassium 3.5 - 5.1 mmol/L 3.6 3.3(L) 3.3(L)  Chloride 98 - 111 mmol/L 104 104 104  CO2 22 - 32 mmol/L 27 27 28   Calcium 8.9 - 10.3 mg/dL 8.2(L) 8.3(L) 8.5(L)  Total Protein 6.5 - 8.1 g/dL 6.2(L) - -  Total Bilirubin 0.3 - 1.2 mg/dL 0.8 - -  Alkaline Phos 38 - 126 U/L 54 - -  AST 15 - 41 U/L 63(H) - -  ALT 0 - 44 U/L 42 - -     Imaging studies: no new pertinent imaging studies   Assessment/Plan:  79 y.o. male with resolving POI 7 Days Post-Op s/p exploratory laparotomy, right inguinal hernia repair, small bowel resection, and orchiectomy for strangulated right inguinal hernia with right spermatic cord abscess   - Advance diet as tolerated  - Begin to wean TPN to half rate today; once tolerating soft/regular diet, we can discontinue this  - Replete electrolytes; monitor    - Monitor abdominal examination; on-going bowel function  - Recommend bowel regimen +/- enema prn             - Pain control prn; antiemetics prn   - OOB; PT following; recommending SNF  - Appreciate medicine and cardiology assistance   All of the above findings and recommendations were discussed with the patient, and the medical team.  -- Edison Simon, PA-C [facility] 06/03/2020, 7:28 AM [phone] M-F: 7am - 4pm

## 2020-06-03 NOTE — Progress Notes (Signed)
Physical Therapy Treatment Patient Details Name: Craig [name] MRN: [id number] DOB: 1941/04/12 Today's Date: 06/03/2020    History of Present Illness 79 y.o. male with medical history significant for A. fib/flutter not currently on anticoagulation, CAD, HTN, HLD, TIA, and memory loss.  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    PT Comments    Pt continues to be confused and struggle to follow instructions or maintain cued activities but did manage to stand longer on more standing attempts this date.  Still is inconsistent in ability to follow instructions long enough to make ambulation away from EOB safe, also c/o groin/LQ pain within a few seconds of standing each time causing him to want to forward flex and show little tolerance in standing.  He did manage to stand one time ~45 seconds with side steps but again was too inconsistent and impulsive to tolerate/try more than EOB standing.   Follow Up Recommendations  SNF;Supervision/Assistance - 24 hour     Equipment Recommendations  None recommended by PT    Recommendations for Other Services       Precautions / Restrictions Precautions Precautions: Fall Restrictions Weight Bearing Restrictions: No    Mobility  Bed Mobility Overal bed mobility: Needs Assistance Bed Mobility: Supine to Sit;Sit to Supine     Supine to sit: Min assist Sit to supine: Mod assist   General bed mobility comments: Pt with halting ability to work toward EOB, he would initiate movement and then stop needing heavy cuing to get back on track.  Ultimately needed direct assist to insure he kept trying to get to sitting  Transfers Overall transfer level: Needs assistance Equipment used: Rolling walker (2 wheeled) Transfers: Sit to/from Stand Sit to Stand: Min assist         General transfer comment: Again  multiple standing efforts with varying success.  He was better able to tolerate standing this date on 2 of the session but still the longest he managed before sitting despite cues was 30-45 ft.  He c/o pain in groins in standing each time.  Ambulation/Gait             General Gait Details: again unable to follow cues well enough to be able to do any real ambulation safely.  He did manage some side stepping along EOB but remains unsafe and impulsive   Stairs             Wheelchair Mobility    Modified Rankin (Stroke Patients Only)       Balance Overall balance assessment: Needs assistance   Sitting balance-Leahy Scale: Fair     Standing balance support: Bilateral upper extremity supported Standing balance-Leahy Scale: Poor Standing balance comment: poor tolerance and ability to maintain upright even with walker and assist                            Cognition Arousal/Alertness: Awake/alert Behavior During Therapy: Restless Overall Cognitive Status: Difficult to assess                                 General Comments: more alert than on eval but still with rambling sentences and poor ability to follow instruction      Exercises      General Comments  Pertinent Vitals/Pain Pain Assessment: Faces Faces Pain Scale: Hurts even more Pain Location: groin pain during standing    Home Living                      Prior Function            PT Goals (current goals can now be found in the care plan section) Progress towards PT goals: Progressing toward goals    Frequency    Min 2X/week      PT Plan Current plan remains appropriate    Co-evaluation              AM-PAC PT "6 Clicks" Mobility   Outcome Measure  Help needed turning from your back to your side while in a flat bed without using bedrails?: A Little Help needed moving from lying on your back to sitting on the side of a flat bed without using  bedrails?: A Little Help needed moving to and from a bed to a chair (including a wheelchair)?: A Lot Help needed standing up from a chair using your arms (e.g., wheelchair or bedside chair)?: A Lot Help needed to walk in hospital room?: Total Help needed climbing 3-5 steps with a railing? : Total 6 Click Score: 12    End of Session Equipment Utilized During Treatment: Gait belt Activity Tolerance: Patient limited by pain;Patient limited by fatigue Patient left: with bed alarm set;with call bell/phone within reach Nurse Communication: Mobility status PT Visit Diagnosis: Muscle weakness (generalized) (M62.81);Difficulty in walking, not elsewhere classified (R26.2);Pain Pain - Right/Left: Right Pain - part of body: Hip     Time: 1711-1737 PT Time Calculation (min) (ACUTE ONLY): 26 min  Charges:  $Therapeutic Activity: 23-37 mins                     Kreg Shropshire, DPT 06/03/2020, 6:04 PM

## 2020-06-04 LAB — BASIC METABOLIC PANEL
Anion gap: 5 (ref 5–15)
BUN: 16 mg/dL (ref 8–23)
CO2: 25 mmol/L (ref 22–32)
Calcium: 7.8 mg/dL — ABNORMAL LOW (ref 8.9–10.3)
Chloride: 104 mmol/L (ref 98–111)
Creatinine, Ser: 0.72 mg/dL (ref 0.61–1.24)
GFR, Estimated: >60 mL/min (ref >60)
Glucose, Bld: 99 mg/dL (ref 70–99)
Potassium: 4.2 mmol/L (ref 3.5–5.1)
Sodium: 134 mmol/L — ABNORMAL LOW (ref 135–145)

## 2020-06-04 LAB — GLUCOSE, CAPILLARY
Glucose-Capillary: 102 mg/dL — ABNORMAL HIGH (ref 70–99)
Glucose-Capillary: 123 mg/dL — ABNORMAL HIGH (ref 70–99)
Glucose-Capillary: 124 mg/dL — ABNORMAL HIGH (ref 70–99)
Glucose-Capillary: 90 mg/dL (ref 70–99)
Glucose-Capillary: 92 mg/dL (ref 70–99)
Glucose-Capillary: 99 mg/dL (ref 70–99)

## 2020-06-04 LAB — PHOSPHORUS: Phosphorus: 1.7 mg/dL — ABNORMAL LOW (ref 2.5–4.6)

## 2020-06-04 LAB — MAGNESIUM: Magnesium: 1.9 mg/dL (ref 1.7–2.4)

## 2020-06-04 MED ORDER — LOSARTAN POTASSIUM 25 MG PO TABS
25.0000 mg | ORAL_TABLET | Freq: Every day | ORAL | Status: DC
  Administered 2020-06-04 – 2020-06-15 (×12): 25 mg via ORAL
  Filled 2020-06-04 (×12): qty 1

## 2020-06-04 MED ORDER — K PHOS MONO-SOD PHOS DI & MONO 155-852-130 MG PO TABS
500.0000 mg | ORAL_TABLET | ORAL | Status: AC
Start: 2020-06-04 — End: 2020-06-04
  Administered 2020-06-04 (×4): 500 mg via ORAL
  Filled 2020-06-04 (×4): qty 2

## 2020-06-04 MED ORDER — METOPROLOL SUCCINATE ER 25 MG PO TB24
25.0000 mg | ORAL_TABLET | Freq: Every day | ORAL | Status: DC
  Administered 2020-06-04 – 2020-06-15 (×12): 25 mg via ORAL
  Filled 2020-06-04 (×12): qty 1

## 2020-06-04 MED ORDER — PANTOPRAZOLE SODIUM 40 MG PO TBEC
40.0000 mg | DELAYED_RELEASE_TABLET | Freq: Every day | ORAL | Status: DC
  Administered 2020-06-05 – 2020-06-15 (×11): 40 mg via ORAL
  Filled 2020-06-04 (×12): qty 1

## 2020-06-04 MED ORDER — DONEPEZIL HCL 5 MG PO TABS
10.0000 mg | ORAL_TABLET | Freq: Every day | ORAL | Status: DC
  Administered 2020-06-04 – 2020-06-14 (×9): 10 mg via ORAL
  Filled 2020-06-04 (×12): qty 2

## 2020-06-04 NOTE — Progress Notes (Signed)
Physical Therapy Treatment Patient Details Name: Craig [name] MRN: [id number] DOB: 04-17-1941 Today's Date: 06/04/2020    History of Present Illness 79 y.o. male with medical history significant for A. fib/flutter not currently on anticoagulation, CAD, HTN, HLD, TIA, and memory loss.  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    PT Comments    Pt in bed drinking Ensure with straw tipping bottle up and spilling contents.  Assisted with what was left.  He is able to transition to EOB with min guard for safety.  Generally steady in sitting but does rest elbows on knees and on occasion elbows slide off but he is able to self correct.  [name] is changed as it is soiled from Ensure.  Pt has difficulty following cues for task and being attentive to IV lines and becomes somewhat agitated when cues to be careful.  +2 is called for mobility/safety.  He is able to stand with RW with general ease.  Stands for about 30 seconds before sitting quickly and asking for the bathroom.  Commode is brought to bedside and he is able to transfer with min guard x 2 and produce a small soft BM.  Care provided.  Pt continues with poor hand placements, does not turn fully before sitting despite cues.  Cognition remains a barrier but he does not report any groin pain today.  Returned to bed per RN request given cognition.     Follow Up Recommendations  SNF;Supervision/Assistance - 24 hour     Equipment Recommendations  None recommended by PT    Recommendations for Other Services       Precautions / Restrictions Precautions Precautions: Fall Restrictions Weight Bearing Restrictions: No    Mobility  Bed Mobility Overal bed mobility: Needs Assistance Bed Mobility: Supine to Sit;Sit to Supine     Supine to sit: Min guard Sit to supine: Min guard      Transfers Overall  transfer level: Needs assistance Equipment used: Rolling walker (2 wheeled) Transfers: Sit to/from Stand Sit to Stand: Min guard;+2 safety/equipment         General transfer comment: able to stand x 3 and transfer to commode with RW with no groin pain.  Does sit quickly with poor hand placements before turning fully  Ambulation/Gait Ambulation/Gait assistance: Min guard;+2 safety/equipment Gait Distance (Feet): 3 Feet Assistive device: Rolling walker (2 wheeled) Gait Pattern/deviations: Step-to pattern;Trunk flexed     General Gait Details: able to transfer to commode and back. +2 for safety and line management as he does not attend or take well to cues to do so.   Stairs             Wheelchair Mobility    Modified Rankin (Stroke Patients Only)       Balance Overall balance assessment: Needs assistance   Sitting balance-Leahy Scale: Good     Standing balance support: Bilateral upper extremity supported Standing balance-Leahy Scale: Poor                              Cognition Arousal/Alertness: Awake/alert Behavior During Therapy: Restless Overall Cognitive Status: Difficult to assess                                 General Comments: continues with difficulty following cues  and direction at times.      Exercises Other Exercises Other Exercises: seated LAQ and marches    General Comments        Pertinent Vitals/Pain Pain Assessment: No/denies pain    Home Living                      Prior Function            PT Goals (current goals can now be found in the care plan section) Progress towards PT goals: Progressing toward goals    Frequency    Min 2X/week      PT Plan Current plan remains appropriate    Co-evaluation              AM-PAC PT "6 Clicks" Mobility   Outcome Measure  Help needed turning from your back to your side while in a flat bed without using bedrails?: A Little Help needed  moving from lying on your back to sitting on the side of a flat bed without using bedrails?: A Little Help needed moving to and from a bed to a chair (including a wheelchair)?: A Little Help needed standing up from a chair using your arms (e.g., wheelchair or bedside chair)?: A Little Help needed to walk in hospital room?: A Little Help needed climbing 3-5 steps with a railing? : Total 6 Click Score: 16    End of Session Equipment Utilized During Treatment: Gait belt Activity Tolerance: Patient tolerated treatment well Patient left: with bed alarm set;with call bell/phone within reach;in bed Nurse Communication: Mobility status       Time: 1031-1056 PT Time Calculation (min) (ACUTE ONLY): 25 min  Charges:  $Therapeutic Activity: 23-37 mins                    Chesley Noon, PTA 06/04/20, 11:44 AM

## 2020-06-04 NOTE — NC FL2 (Signed)
[place] LEVEL OF CARE SCREENING TOOL     IDENTIFICATION  Patient Name: Craig [name] Birthdate: 25-Apr-1941 Sex: male Admission Date (Current Location): 05/26/2020  Micro and [place] Number:  Engineering geologist and Address:  [facility], [address], [place], [place] [address]      Provider Number: [id number]  Attending Physician Name and Address:  Wyvonnia Dusky, MD  Relative Name and Phone Number:  Pryor Guettler [phone]    Current Level of Care: Hospital Recommended Level of Care: [place] Prior Approval Number:    Date Approved/Denied:   PASRR Number: Pending  Discharge Plan: SNF    Current Diagnoses: Patient Active Problem List   Diagnosis Date Noted  . HFrEF (heart failure with reduced ejection fraction) ([place])   . Atrial flutter with rapid ventricular response ([place]) 05/26/2020  . SBO (small bowel obstruction) ([place]) 05/26/2020  . Hypernatremia 05/26/2020  . Nicotine dependence 05/26/2020  . Protein-calorie malnutrition, severe 04/22/2020  . Dementia with behavioral disturbance ([place]) 04/21/2020  . Altered mental status 04/20/2020  . Atrial flutter ([place])   . Bradycardia   . CAD (coronary artery disease), native coronary artery 12/25/2017  . History of coronary artery stent placement 12/25/2017  . Hyperlipidemia 12/25/2017  . Former smoker 12/25/2017  . Essential hypertension 12/25/2017  . Memory loss 12/25/2017    Orientation RESPIRATION BLADDER Height & Weight     Self  Normal Incontinent Weight: 75.9 kg Height:     BEHAVIORAL SYMPTOMS/MOOD NEUROLOGICAL BOWEL NUTRITION STATUS      Incontinent Supplemental  AMBULATORY STATUS COMMUNICATION OF NEEDS Skin   Limited Assist Verbally Normal                       Personal Care Assistance Level of Assistance              Functional Limitations Info  Sight,Hearing,Speech Sight Info: Adequate Hearing Info: Adequate Speech  Info: Impaired    SPECIAL CARE FACTORS FREQUENCY  PT (By licensed PT),OT (By licensed OT)     PT Frequency: 5x week OT Frequency: 5x week            Contractures Contractures Info: Not present    Additional Factors Info  Code Status,Allergies Code Status Info: Full Allergies Info: PCN           Current Medications (06/04/2020):  This is the current hospital active medication list Current Facility-Administered Medications  Medication Dose Route Frequency Provider Last Rate Last Admin  . 0.9 %  sodium chloride infusion  250 mL Intravenous Continuous Awilda Bill, NP   Stopped at 06/01/20 0630  . acetaminophen (TYLENOL) tablet 650 mg  650 mg Oral Q6H PRN Athena Masse, MD       Or  . acetaminophen (TYLENOL) suppository 650 mg  650 mg Rectal Q6H PRN Athena Masse, MD      . bisacodyl (DULCOLAX) suppository 10 mg  10 mg Rectal q morning - 10a Ronny Bacon, MD   10 mg at 06/04/20 1014  . Chlorhexidine Gluconate Cloth 2 % PADS 6 each  6 each Topical Daily Flora Lipps, MD   6 each at 06/04/20 1016  . donepezil (ARICEPT) tablet 10 mg  10 mg Oral QHS Wyvonnia Dusky, MD      . enoxaparin (LOVENOX) injection 40 mg  40 mg Subcutaneous Q24H Rosine Door, MD   40 mg at 06/04/20 1015  . feeding supplement (ENSURE  ENLIVE / ENSURE PLUS) liquid 237 mL  237 mL Oral TID BM Wyvonnia Dusky, MD   237 mL at 06/04/20 1420  . insulin aspart (novoLOG) injection 0-9 Units  0-9 Units Subcutaneous Q8H Benita Gutter, RPH   1 Units at 06/02/20 1818  . lactated ringers infusion   Intravenous Continuous Wyvonnia Dusky, MD 60 mL/hr at 06/03/20 2158 New Bag at 06/03/20 2158  . LORazepam (ATIVAN) tablet 0.5 mg  0.5 mg Oral Q6H PRN Nolberto Hanlon, MD   0.5 mg at 06/04/20 0106   Or  . LORazepam (ATIVAN) injection 0.5 mg  0.5 mg Intravenous Q6H PRN Nolberto Hanlon, MD   0.5 mg at 06/01/20 1636  . losartan (COZAAR) tablet 25 mg  25 mg Oral Daily Kathlyn Sacramento A, MD      . MEDLINE mouth  rinse  15 mL Mouth Rinse BID Nolberto Hanlon, MD   15 mL at 06/04/20 1016  . metoprolol succinate (TOPROL-XL) 24 hr tablet 25 mg  25 mg Oral Daily Wyvonnia Dusky, MD      . metoprolol tartrate (LOPRESSOR) injection 5 mg  5 mg Intravenous Q6H PRN Lang Snow, NP      . multivitamin with minerals tablet 1 tablet  1 tablet Oral Daily Wyvonnia Dusky, MD   1 tablet at 06/04/20 1015  . ondansetron (ZOFRAN) tablet 4 mg  4 mg Oral Q6H PRN Athena Masse, MD       Or  . ondansetron [facility]) injection 4 mg  4 mg Intravenous Q6H PRN Athena Masse, MD      . Derrill Memo ON 06/05/2020] pantoprazole (PROTONIX) EC tablet 40 mg  40 mg Oral Daily Benn Moulder, RPH      . phosphorus (K PHOS NEUTRAL) tablet 500 mg  500 mg Oral Q4H Deatra Robinson B, RPH   500 mg at 06/04/20 1420  . sodium chloride flush (NS) 0.9 % injection 10-40 mL  10-40 mL Intracatheter Q12H Nolberto Hanlon, MD   10 mL at 06/04/20 1016  . sodium chloride flush (NS) 0.9 % injection 10-40 mL  10-40 mL Intracatheter PRN Nolberto Hanlon, MD   10 mL at 06/01/20 2117  . sodium phosphate (FLEET) 7-19 GM/118ML enema 1 enema  1 enema Rectal Daily PRN Ronny Bacon, MD      . TPN ADULT (ION)   Intravenous Continuous TPN Benn Moulder, RPH 40 mL/hr at 06/03/20 2212 New Bag at 06/03/20 2212     Discharge Medications: Please see discharge summary for a list of discharge medications.  Relevant Imaging Results:  Relevant Lab Results:   Additional Information    Kerin Salen, RN

## 2020-06-04 NOTE — Consult Note (Addendum)
PHARMACY - Electrolyte CONSULT NOTE   Indication: Prolonged ileus  Patient Measurements: Weight: 76.5 kg (168 lb 10.4 oz)   Body mass index is 21.65 kg/m. 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 Patient subsequently found to have incarcerated right inguinal hernia with strangulated (gangrenous) knuckle of small bowel with perforation, SBO, and right spermatic cord abscess. Pt underwent exploratory laparotomy, right inguinal hernia repair, small bowel resection, and orchiectomy on 12/9. Pt had absent bowel sounds prevously, however small bowel sounds heard so pt is currently on clear, liquid diet. No nutritional support at this time. Pharmacy consulted to initiate TPN for prolonged ileus. Pt on TPN from 12/14 - 12/17.   12/16: Surgery reported pt diet advancing, now on full liquids; reportedly tolerating without issues. He is having bowel function and had recorded BM. Will begin weaning TPN today; once tolerating soft/regular diet, Surgery recommends discontinuing  12/17: Pt now on soft diet. Pt advanced to soft diet without issue. Pt is having bowel function and had recorded BM. Surgery recommends discontinuing TPN. TPN will be discontinued once current bag runs out.   LR @ 60 mL/hr   Plan:  - Discontinue fluids and TPN after current bag runs out @1800  - K Phos Neutral 500 mg PO every 4 hours x 4 doses ordered - Pharmacy will continue to monitor and replace electrolytes daily as needed  Benn Moulder, PharmD Pharmacy Resident  06/03/2020 9:36 AM

## 2020-06-04 NOTE — Progress Notes (Signed)
[place] Hospital Day(s): 8.   Post op day(s): 8 Days Post-Op.   Interval History:  Patient seen and examined no acute events or new complaints overnight, Patient not most reliable historian given mental status, he reports that he is doing well He denied any complaints, no abdominal pain, nausea, emesis sCr remains normal at 0.72, UO - 1.3 + unmeasured Mild hypophosphatemia to 1.7 despite repletion; o/w no significant electrolyte derangements  Advanced to soft diet without issue He is having bowel function and had recorded BM Weaning from TPN Worked with PT; recommending SNF  Vital signs in last 24 hours: [min-max] current  Temp:  [97.7 F (36.5 C)-98.5 F (36.9 C)] 97.8 F (36.6 C) (12/17 0452) Pulse Rate:  [62-86] 68 (12/17 0452) Resp:  [18-19] 19 (12/17 0452) BP: (116-157)/(64-107) 149/84 (12/17 0452) SpO2:  [100 %] 100 % (12/17 0452) Weight:  [75.9 kg] 75.9 kg (12/17 0213)       Weight: 75.9 kg BMI (Calculated): 21.47   Intake/Output last 2 shifts:  12/16 0701 - 12/17 0700 In: 1509.2 [P.O.:600; I.V.:909.2] Out: 1350 [Urine:1350]   Physical Exam:  Constitutional: alert, cooperative and no distress Respiratory: breathing non-labored at rest  Cardiovascular: regular rate and rhythm  Gastrointestinal: Soft, no appreciable tenderness, he does not appear distended, no rebound or guarding Integumentary: Right inguinal and lower midline incisions are both CDI with staples, no drainage this morning, incisions both remain without erythema  Labs:  CBC Latest Ref Rng & Units 06/02/2020 06/01/2020 05/31/2020  WBC 4.0 - 10.5 K/uL 7.8 8.7 7.5  Hemoglobin 13.0 - 17.0 g/dL 12.3(L) 11.8(L) 12.0(L)  Hematocrit 39.0 - 52.0 % 37.6(L) 35.4(L) 38.5(L)  Platelets 150 - 400 K/uL 160 157 147(L)   CMP Latest Ref Rng & Units 06/04/2020 06/03/2020 06/02/2020  Glucose 70 - 99 mg/dL 99 106(H) 110(H)  BUN 8 - 23 mg/dL 16 11 9   Creatinine 0.61 - 1.24  mg/dL 0.72 0.71 0.70  Sodium 135 - 145 mmol/L 134(L) 135 138  Potassium 3.5 - 5.1 mmol/L 4.2 3.8 3.6  Chloride 98 - 111 mmol/L 104 103 104  CO2 22 - 32 mmol/L 25 26 27   Calcium 8.9 - 10.3 mg/dL 7.8(L) 8.2(L) 8.2(L)  Total Protein 6.5 - 8.1 g/dL - 6.3(L) 6.2(L)  Total Bilirubin 0.3 - 1.2 mg/dL - 0.6 0.8  Alkaline Phos 38 - 126 U/L - 59 54  AST 15 - 41 U/L - 54(H) 63(H)  ALT 0 - 44 U/L - 42 42     Imaging studies: no new pertinent imaging studies   Assessment/Plan:  79 y.o. male with resolving POI 8 Days Post-Op s/p exploratory laparotomy, right inguinal hernia repair, small bowel resection, and orchiectomy for strangulated right inguinal hernia with right spermatic cord abscess   - Continue soft/regular diet + nutritional supplementation as needed  - Okay to stop TPN  - Replete electrolytes; monitor    - Monitor abdominal examination; on-going bowel function  - Recommend bowel regimen +/- enema prn             - Pain control prn; antiemetics prn   - OOB; PT following; recommending SNF  - Appreciate medicine and cardiology assistance    - All surgical issues have resolved, and he is okay for discharge from general surgery standpoint. General surgery will sign off, and discharge to SNF once medically stable. If he remains here on POD10 (12/19), it is okay to remove staples and replace with steri-strips, please feel  free to reach out if needing assistance or guidance on this. Otherwise, he will need follow up ~2 weeks from discharge.   All of the above findings and recommendations were discussed with the patient, and the medical team.  -- Edison Simon, PA-C [facility] 06/04/2020, 7:21 AM [phone] M-F: 7am - 4pm

## 2020-06-04 NOTE — Progress Notes (Signed)
PHARMACIST - PHYSICIAN COMMUNICATION  CONCERNING: IV to Oral Route Change Policy  RECOMMENDATION: This patient is receiving pantoprazole by the intravenous route.  Based on criteria approved by the Pharmacy and Therapeutics Committee, the intravenous medication(s) is/are being converted to the equivalent oral dose form(s).   DESCRIPTION: These criteria include:  The patient is eating (either orally or via tube) and/or has been taking other orally administered medications for a least 24 hours  The patient has no evidence of active gastrointestinal bleeding or impaired GI absorption (gastrectomy, short bowel, patient on TNA or NPO).  If you have questions about this conversion, please contact the [place], PharmD Pharmacy Resident  06/04/2020 10:45 AM

## 2020-06-04 NOTE — Progress Notes (Signed)
PROGRESS NOTE    Craig [name]  NTI:[id number] DOB: Jul 27, 1940 DOA: 05/26/2020 PCP: [name], MD    Assessment & Plan:   Principal Problem:   SBO (small bowel obstruction) ([place]) Active Problems:   CAD (coronary artery disease), native coronary artery   History of coronary artery stent placement   Essential hypertension   Dementia with behavioral disturbance (HCC)   Atrial flutter with rapid ventricular response (HCC)   Hypernatremia   Nicotine dependence   HFrEF (heart failure with reduced ejection fraction) ([place])   Incarcerated right inguinal hernia: w/ strangulated(gangrenous)knuckle of small bowelwith perforation, small bowel obstruction and right spermatic cord abscess. Will wean off TPN today and continue w/ soft diet as per surg.D/c IVFs. Completed course of abxs  A. fib /flutter: w/ RVR. Possibly PAF. Transitioned to po metoprolol. Not a good candidate for anticoagulation secondary to above   Likely acute on chronic systolic CHF: echo shows QQ76-19%, LV demonstrates global hypokinesis & diastolic parameters are indeterminate. Started on po metoprolol. May need ischemic work-up as per cardio   Acute metabolic encephalopathy: w/ hx of dementia w/ behavioral disturbance. Re-orient prn   Elevated troponin: likely secondary to demand ischemia. May need ischemic work-up as per cardio  Hypokalemia: WNL today   Hypophosphatemia: replete as per pharmacy   Dementia-: restarted donepezil  DVT prophylaxis: lovenox  Code Status: full  Family Communication: discussed pt's care w/ daughter and answered her questions. Also talked to pt's son who does not know the pt's medical hx.  Disposition Plan: PT/OT recs SNF    Status is: Inpatient  Remains inpatient appropriate because:Ongoing diagnostic testing needed not appropriate for outpatient work up, Unsafe d/c plan, IV treatments appropriate due to intensity of illness or inability to take PO and Inpatient level of  care appropriate due to severity of illness   Dispo: The patient is from: Home              Anticipated d/c is to: SNF              Anticipated d/c date is: > 3 days              Patient currently is not medically stable to d/c.         Consultants:   Cardio  General surg    Procedures:    Antimicrobials:   Subjective: Pt c/o malaise. Pt is oriented to person only.   Objective: Vitals:   06/03/20 2348 06/04/20 0213 06/04/20 0452 06/04/20 0724  BP: 116/64  (!) 149/84 (!) 134/91  Pulse: 63  68 65  Resp:   19 18  Temp:   97.8 F (36.6 C) 97.8 F (36.6 C)  TempSrc:   Oral Oral  SpO2:   100% 97%  Weight:  75.9 kg      Intake/Output Summary (Last 24 hours) at 06/04/2020 0742 Last data filed at 06/04/2020 0700 Gross per 24 hour  Intake 1509.21 ml  Output 1350 ml  Net 159.21 ml   Filed Weights   06/02/20 0313 06/03/20 0611 06/04/20 0213  Weight: 73.9 kg 76.5 kg 75.9 kg    Examination:  General exam: Appears calm & comfortable  Respiratory system: clear breath sounds b/l  Cardiovascular system: S1 & S2+. No  rubs, gallops or clicks. Gastrointestinal system: Abdomen is nondistended, soft and nontender. Normal bowel sounds  Central nervous system: Alert and oriented to person only. Moves all extremities  Psychiatry: Judgement and insight appear abnormal. Flat mood and affect  Data Reviewed: I have personally reviewed following labs and imaging studies  CBC: Recent Labs  Lab 05/29/20 0039 05/30/20 0620 05/31/20 0620 06/01/20 0921 06/02/20 0550  WBC 12.2* 8.9 7.5 8.7 7.8  NEUTROABS  --   --   --   --  6.5  HGB 10.9* 10.3* 12.0* 11.8* 12.3*  HCT 35.4* 32.6* 38.5* 35.4* 37.6*  MCV 100.9* 98.8 98.5 94.7 96.9  PLT 149* 149* 147* 157 174   Basic Metabolic Panel: Recent Labs  Lab 05/31/20 0620 06/01/20 0921 06/02/20 0550 06/03/20 0622 06/04/20 0508  NA 139 139 138 135 134*  K 3.3* 3.3* 3.6 3.8 4.2  CL 104 104 104 103 104  CO2 28 27 27 26  25   GLUCOSE 116* 121* 110* 106* 99  BUN 10 8 9 11 16   CREATININE 0.75 0.85 0.70 0.71 0.72  CALCIUM 8.5* 8.3* 8.2* 8.2* 7.8*  MG 1.8 2.0 1.9 1.9 1.9  PHOS 1.4* 1.7* 2.3* 2.0* 1.7*   GFR: Estimated Creatinine Clearance: 83 mL/min (by C-G formula based on SCr of 0.72 mg/dL). Liver Function Tests: Recent Labs  Lab 06/02/20 0550 06/03/20 0622  AST 63* 54*  ALT 42 42  ALKPHOS 54 59  BILITOT 0.8 0.6  PROT 6.2* 6.3*  ALBUMIN 2.0* 2.0*   No results for input(s): LIPASE, AMYLASE in the last 168 hours. No results for input(s): AMMONIA in the last 168 hours. Coagulation Profile: No results for input(s): INR, PROTIME in the last 168 hours. Cardiac Enzymes: No results for input(s): CKTOTAL, CKMB, CKMBINDEX, TROPONINI in the last 168 hours. BNP (last 3 results) No results for input(s): PROBNP in the last 8760 hours. HbA1C: No results for input(s): HGBA1C in the last 72 hours. CBG: Recent Labs  Lab 06/03/20 1706 06/03/20 2052 06/03/20 2338 06/04/20 0454 06/04/20 0739  GLUCAP 110* 115* 105* 102* 92   Lipid Profile: Recent Labs    06/02/20 0550  TRIG 79   Thyroid Function Tests: No results for input(s): TSH, T4TOTAL, FREET4, T3FREE, THYROIDAB in the last 72 hours. Anemia Panel: No results for input(s): VITAMINB12, FOLATE, FERRITIN, TIBC, IRON, RETICCTPCT in the last 72 hours. Sepsis Labs: Recent Labs  Lab 05/29/20 0039  PROCALCITON 1.62    Recent Results (from the past 240 hour(s))  Blood culture (routine single)     Status: None   Collection Time: 05/26/20 10:30 PM   Specimen: BLOOD  Result Value Ref Range Status   Specimen Description BLOOD RIGHT ANTECUBITAL  Final   Special Requests   Final    BOTTLES DRAWN AEROBIC AND ANAEROBIC Blood Culture results may not be optimal due to an inadequate volume of blood received in culture bottles   Culture   Final    NO GROWTH 5 DAYS Performed at [facility], [address] [place], [place] [address]    Report  Status 05/31/2020 FINAL  Final  Resp Panel by RT-PCR (Flu A&B, Covid) Nasopharyngeal Swab     Status: None   Collection Time: 05/27/20 12:37 AM   Specimen: Nasopharyngeal Swab; Nasopharyngeal(NP) swabs in vial transport medium  Result Value Ref Range Status   SARS Coronavirus 2 by RT PCR NEGATIVE NEGATIVE Final    Comment: (NOTE) SARS-CoV-2 target nucleic acids are NOT DETECTED.  The SARS-CoV-2 RNA is generally detectable in upper respiratory specimens during the acute phase of infection. The lowest concentration of SARS-CoV-2 viral copies this assay can detect is 138 copies/mL. A negative result does not preclude SARS-Cov-2 infection and should not be used  as the sole basis for treatment or other patient management decisions. A negative result may occur with  improper specimen collection/handling, submission of specimen other than nasopharyngeal swab, presence of viral mutation(s) within the areas targeted by this assay, and inadequate number of viral copies(<138 copies/mL). A negative result must be combined with clinical observations, patient history, and epidemiological information. The expected result is Negative.  Fact Sheet for Patients:  EntrepreneurPulse.com.au  Fact Sheet for Healthcare Providers:  IncredibleEmployment.be  This test is no t yet approved or cleared by the [place] FDA and  has been authorized for detection and/or diagnosis of SARS-CoV-2 by FDA under an Emergency Use Authorization (EUA). This EUA will remain  in effect (meaning this test can be used) for the duration of the COVID-19 declaration under Section 564(b)(1) of the Act, 21 U.S.C.section 360bbb-3(b)(1), unless the authorization is terminated  or revoked sooner.       Influenza A by PCR NEGATIVE NEGATIVE Final   Influenza B by PCR NEGATIVE NEGATIVE Final    Comment: (NOTE) The Xpert Xpress SARS-CoV-2/FLU/RSV plus assay is intended as an aid in the  diagnosis of influenza from Nasopharyngeal swab specimens and should not be used as a sole basis for treatment. Nasal washings and aspirates are unacceptable for Xpert Xpress SARS-CoV-2/FLU/RSV testing.  Fact Sheet for Patients: EntrepreneurPulse.com.au  Fact Sheet for Healthcare Providers: IncredibleEmployment.be  This test is not yet approved or cleared by the [place] FDA and has been authorized for detection and/or diagnosis of SARS-CoV-2 by FDA under an Emergency Use Authorization (EUA). This EUA will remain in effect (meaning this test can be used) for the duration of the COVID-19 declaration under Section 564(b)(1) of the Act, 21 U.S.C. section 360bbb-3(b)(1), unless the authorization is terminated or revoked.  Performed at [facility], [address] [place], [place] [address]   MRSA PCR Screening     Status: None   Collection Time: 05/27/20  5:00 AM   Specimen: Nasopharyngeal  Result Value Ref Range Status   MRSA by PCR NEGATIVE NEGATIVE Final    Comment:        The GeneXpert MRSA Assay (FDA approved for NASAL specimens only), is one component of a comprehensive MRSA colonization surveillance program. It is not intended to diagnose MRSA infection nor to guide or monitor treatment for MRSA infections. Performed at [facility], [address], [place], [place] [address]   Urine culture     Status: None   Collection Time: 05/27/20  5:43 AM   Specimen: In/Out Cath Urine  Result Value Ref Range Status   Specimen Description   Final    IN/OUT CATH URINE Performed at [facility], [address] [place], [place] [address]    Special Requests   Final    NONE Performed at [facility], [address], [place], [place] [address]    Culture   Final    NO GROWTH Performed at [facility], [place] [address] [place], [place] [address]    Report Status 05/28/2020 FINAL  Final          Radiology Studies: No results found.      Scheduled Meds: . bisacodyl  10 mg Rectal q morning - 10a  . Chlorhexidine Gluconate Cloth  6 each Topical Daily  . enoxaparin (LOVENOX) injection  40 mg Subcutaneous Q24H  . feeding supplement  237 mL Oral TID BM  . insulin aspart  0-9 Units Subcutaneous Q8H  . mouth rinse  15 mL Mouth Rinse BID  . metoprolol tartrate  5 mg Intravenous Q6H  . multivitamin with minerals  1 tablet Oral Daily  . pantoprazole (PROTONIX) IV  40 mg Intravenous Daily  . sodium chloride flush  10-40 mL Intracatheter Q12H   Continuous Infusions: . sodium chloride Stopped (06/01/20 0630)  . lactated ringers 60 mL/hr at 06/03/20 2158  . TPN ADULT (ION) 40 mL/hr at 06/03/20 2212     LOS: 8 days    Time spent: 34 mins    Wyvonnia Dusky, MD [facility] Pager [phone]  If 7PM-7AM, please contact night-coverage 06/04/2020, 7:42 AM

## 2020-06-04 NOTE — Progress Notes (Signed)
Nutrition Follow Up Note   DOCUMENTATION CODES:   Severe malnutrition in context of social or environmental circumstances  INTERVENTION:   TPN per pharmacy- plan is to discontinue today- pharmacy will continue to monitor electrolytes as pt is refeeding  Ensure Enlive po TID, each supplement provides 350 kcal and 20 grams of protein  Magic cup TID with meals, each supplement provides 290 kcal and 9 grams of protein  MVI daily   NUTRITION DIAGNOSIS:   Severe Malnutrition related to social / environmental circumstances (dementia, suspected inadequate oral intake) as evidenced by severe fat depletion,severe muscle depletion.  GOAL:   Patient will meet greater than or equal to 90% of their needs  -met with TPN, progressing with oral intake   MONITOR:   PO intake,Supplement acceptance,Labs,Skin,I & O's,Weight trends  ASSESSMENT:   79 year old male with PMHx of dementia, HTN, atrial flutter, CAD, HLD, TIA admitted with right inguinal hernia with strangulated knuckle of small bowel, small bowel obstruction, and right spermatic cord abscess s/p right inguinal hernia repair, small bowel resection, and right orchiectomy on 12/9.   Met with pt in room today. Pt tolerating TPN at 1/2 rate but continues to refeed. Pt reports poor appetite and oral intake at first but reports that his appetite is starting to improve now. Pt advanced to a soft diet yesterday. Pt is documented to be eating anywhere from sips/bites to 100% of meals in hospital; pt ate 100% of his breakfast this morning. Spoke with RN who reports that pt has been refusing the Ensure supplements. Educated pt today regarding the importance of adequate nutrition needed for post op healing and to preserve lean muscle. Encouraged use of supplements as plan is to discontinue TPN once the current bag runs out. Pt reports that he enjoys Ensure and has had this before. RN brought in an Ensure while RD was still present in pt's room and pt was  drinking it. Goal is for pt to drink 2-3 Ensure per day; would recommend continue this after discharge. Per chart, pt has remained fairly weight stable since admit. Plan is for SNF at discharge.   Medications reviewed and include: dulcolax, lovenox, insulin, protonix, MVI, KPhos  Labs reviewed: Na 134(L), K 4.2 wnl, P 1.7(L), Mg 1.9 wnl Triglycerides- 79- 12/15 cbgs- 102, 92, 123 x 24 hrs  Diet Order:   Diet Order            DIET SOFT Room service appropriate? Yes; Fluid consistency: Thin  Diet effective now                EDUCATION NEEDS:   No education needs have been identified at this time  Skin:  Skin Assessment: Skin Integrity Issues: Skin Integrity Issues:: Incisions Incisions: closed incisions to abdomen and scrotum  Last BM:  12/15- type 6  Height:   Ht Readings from Last 1 Encounters:  05/18/20 6' 2"  (1.88 m)   Weight:   Wt Readings from Last 1 Encounters:  06/04/20 75.9 kg   Ideal Body Weight:  86.4 kg  BMI:  Body mass index is 21.48 kg/m.  Estimated Nutritional Needs:   Kcal:  2000-2200  Protein:  100-110 grams  Fluid:  1.8 L/day  Koleen Distance MS, RD, LDN Please refer to [facility] for RD and/or RD on-call/weekend/after hours pager

## 2020-06-04 NOTE — Progress Notes (Signed)
Progress Note  Patient Name: Craig [name] Date of Encounter: 06/04/2020  Primary Cardiologist: Dr. Lovena Le  Subjective   Reports constipation. No racing HR, palpitations. No SOB or CP. No dizziness. Reports he mainly just wants to be able to use the restroom. At times, he does not respond to my questions.  Inpatient Medications    Scheduled Meds: . bisacodyl  10 mg Rectal q morning - 10a  . Chlorhexidine Gluconate Cloth  6 each Topical Daily  . donepezil  10 mg Oral QHS  . enoxaparin (LOVENOX) injection  40 mg Subcutaneous Q24H  . feeding supplement  237 mL Oral TID BM  . insulin aspart  0-9 Units Subcutaneous Q8H  . mouth rinse  15 mL Mouth Rinse BID  . metoprolol succinate  25 mg Oral Daily  . multivitamin with minerals  1 tablet Oral Daily  . [START ON 06/05/2020] pantoprazole  40 mg Oral Daily  . phosphorus  500 mg Oral Q4H  . sodium chloride flush  10-40 mL Intracatheter Q12H   Continuous Infusions: . sodium chloride Stopped (06/01/20 0630)  . lactated ringers 60 mL/hr at 06/03/20 2158  . TPN ADULT (ION) 40 mL/hr at 06/03/20 2212   PRN Meds: acetaminophen **OR** acetaminophen, LORazepam **OR** LORazepam, metoprolol tartrate, ondansetron **OR** ondansetron (ZOFRAN) IV, sodium chloride flush, sodium phosphate   Vital Signs    Vitals:   06/04/20 0213 06/04/20 0452 06/04/20 0724 06/04/20 1133  BP:  (!) 149/84 (!) 134/91 139/89  Pulse:  68 65 83  Resp:  19 18 18   Temp:  97.8 F (36.6 C) 97.8 F (36.6 C) 98 F (36.7 C)  TempSrc:  Oral Oral Oral  SpO2:  100% 97% 100%  Weight: 75.9 kg       Intake/Output Summary (Last 24 hours) at 06/04/2020 1402 Last data filed at 06/04/2020 1019 Gross per 24 hour  Intake 1149.21 ml  Output 1050 ml  Net 99.21 ml   Last 3 Weights 06/04/2020 06/03/2020 06/02/2020  Weight (lbs) 167 lb 4.8 oz 168 lb 10.4 oz 162 lb 14.4 oz  Weight (kg) 75.887 kg 76.5 kg 73.891 kg      Telemetry    Atrial flutter with variable  AV block, atrial fibrillation, PVCs, with rates 60-low 100s - Personally Reviewed  ECG    No new tracings - Personally Reviewed  Physical Exam   GEN: No acute distress.   Neck: No JVD Cardiac: IRIR, no murmurs, rubs, or gallops.  Respiratory: Clear to auscultation bilaterally. GI: Soft, nontender, non-distended  MS: No edema; No deformity. Neuro:  Nonfocal  Psych: Normal affect   Labs    High Sensitivity Troponin:   Recent Labs  Lab 05/26/20 2024 05/26/20 2230  TROPONINIHS 54* 54*      Chemistry Recent Labs  Lab 06/02/20 0550 06/03/20 0622 06/04/20 0508  NA 138 135 134*  K 3.6 3.8 4.2  CL 104 103 104  CO2 27 26 25   GLUCOSE 110* 106* 99  BUN 9 11 16   CREATININE 0.70 0.71 0.72  CALCIUM 8.2* 8.2* 7.8*  PROT 6.2* 6.3*  --   ALBUMIN 2.0* 2.0*  --   AST 63* 54*  --   ALT 42 42  --   ALKPHOS 54 59  --   BILITOT 0.8 0.6  --   GFRNONAA >60 >60 >60  ANIONGAP 7 6 5      Hematology Recent Labs  Lab 05/31/20 0620 06/01/20 0921 06/02/20 0550  WBC 7.5 8.7 7.8  RBC  3.91* 3.74* 3.88*  HGB 12.0* 11.8* 12.3*  HCT 38.5* 35.4* 37.6*  MCV 98.5 94.7 96.9  MCH 30.7 31.6 31.7  MCHC 31.2 33.3 32.7  RDW 12.4 12.5 12.5  PLT 147* 157 160    BNPNo results for input(s): BNP, PROBNP in the last 168 hours.   DDimer No results for input(s): DDIMER in the last 168 hours.   Radiology    No results found.  Cardiac Studies   Echo 06/01/20 1. Left ventricular ejection fraction, by estimation, is 25 to 30%. The  left ventricle has severely decreased function. The left ventricle  demonstrates global hypokinesis. There is moderate left ventricular  hypertrophy. Left ventricular diastolic  parameters are indeterminate.  2. Right ventricular systolic function is mildly reduced. The right  ventricular size is normal. Tricuspid regurgitation signal is inadequate  for assessing PA pressure.  3. Left atrial size was mildly dilated.  4. Right atrial size was mildly dilated.   5. A small pericardial effusion is present. The pericardial effusion is  circumferential.  6. The mitral valve is normal in structure. Mild to moderate mitral valve  regurgitation. There is mild holosystolic prolapse of of the mitral valve.  7. The aortic valve is tricuspid. Aortic valve regurgitation is not  visualized. No aortic stenosis is present.  8. Aortic dilatation noted. There is mild dilatation of the aortic root,  measuring 40 mm.   Patient Profile     79 y.o. male with history of remote MI, chronic atrial flutter, TIA, PVD, hypertension, hyperlipidemia, and admitted with abdominal pain found to have incarcerated right inguinal hernia s/p surgical repair with postoperative course complicated by shortness of breath and cardiology following for atrial flutter with RVR.  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  Atrial flutter dates back to 2017 on chart.  He remains in atrial tachycardia with controlled ventricular response.  Updated echo as above. EF 25-30%.  Continue current rate control with metoprolol 25mg  daily.   Continue to avoid diltiazem given reduced EF as above.   He was reportedly previously on Xarelto with CHA2DS2VASc score of at least  6 (HTN, agex2, vascular, TIAx2). This was not included on PTA medications, however, and reasons for discontinuation unclear. Ongoing assessment of risks versus benefits of [place] needed and with consideration of pt risk of fall and confusion / mental status as previously noted. Also considered is his recent exploratory laparotomy.  Consider PT/OT once able to determine risk of fall. Would recommend an updated CBC to assist as well.   Maintain electrolytes at goal. TSH wnl.   Will need follow-up with EP.  CAD involving the native coronary arteries --Denies chest pain. --High-sensitivity troponin 54x2.  EKG without acute  ST/T changes.   --Not consistent with ACS.   --Consider supply demand ischemia in the setting of rapid ventricular rate and systolic heart failure.  --ASA can be started if not an [place] candidate due to risk of bleeding.  --Continue BB. --OP follow-up.   HFrEF --Appears euvolemic.  Echo as above with EF 25-30%. Continue to monitor I/os, daily standing weights.  Daily BMET.  Net -1.3L with wt 76.5kg  75.9kg. Continue current medications. Not currently on a diuretic. No indication for start of diuretic at this time.  Hypoalbuminemia --Consider nutrition consult.  Constipation --Per IM, GI.  Strangulated right inguinal hernia with right spermatic cord abscess --Postop day 8, on review surgery note.  Will need to consider with any anticoagulation as well.  For questions or updates, please contact [place] Please consult [email or web address] for contact info under        Signed, Arvil Chaco, PA-C  06/04/2020, 2:02 PM

## 2020-06-05 DIAGNOSIS — I2510 Atherosclerotic heart disease of native coronary artery without angina pectoris: Secondary | ICD-10-CM

## 2020-06-05 LAB — BASIC METABOLIC PANEL
Anion gap: 7 (ref 5–15)
BUN: 15 mg/dL (ref 8–23)
CO2: 27 mmol/L (ref 22–32)
Calcium: 8 mg/dL — ABNORMAL LOW (ref 8.9–10.3)
Chloride: 102 mmol/L (ref 98–111)
Creatinine, Ser: 0.77 mg/dL (ref 0.61–1.24)
GFR, Estimated: >60 mL/min (ref >60)
Glucose, Bld: 85 mg/dL (ref 70–99)
Potassium: 3.6 mmol/L (ref 3.5–5.1)
Sodium: 136 mmol/L (ref 135–145)

## 2020-06-05 LAB — GLUCOSE, CAPILLARY: Glucose-Capillary: 83 mg/dL (ref 70–99)

## 2020-06-05 LAB — LIPID PANEL
Cholesterol: 94 mg/dL (ref 0–200)
HDL: 21 mg/dL — ABNORMAL LOW (ref >40)
LDL Cholesterol: 59 mg/dL (ref 0–99)
Total CHOL/HDL Ratio: 4.5 RATIO
Triglycerides: 68 mg/dL (ref <150)
VLDL: 14 mg/dL (ref 0–40)

## 2020-06-05 LAB — PHOSPHORUS: Phosphorus: 3.1 mg/dL (ref 2.5–4.6)

## 2020-06-05 MED ORDER — ASPIRIN 81 MG PO CHEW
81.0000 mg | CHEWABLE_TABLET | Freq: Every day | ORAL | Status: DC
  Administered 2020-06-05 – 2020-06-15 (×11): 81 mg via ORAL
  Filled 2020-06-05 (×11): qty 1

## 2020-06-05 NOTE — TOC Progression Note (Signed)
Transition of Care [facility]) - Progression Note    Patient Details  Name: Craig [name] MRN: [id number] Date of Birth: 07-Jul-1940  Transition of Care [facility]) CM/SW Contact  Zigmund Daniel Dorian Pod, RN Phone [phone] 06/05/2020, 10:10 AM  Clinical Narrative:     No bed offers from applied SNF. Provider updated and aware of pt's disposition.  TOC team will continue to follow accordingly.       Expected Discharge Plan and Services                                                 Social Determinants of Health (SDOH) Interventions    Readmission Risk Interventions No flowsheet data found.

## 2020-06-05 NOTE — Progress Notes (Signed)
   Spoke with Surgery PA and OK to start ASA 81mg  daily.  Informed patient that we will start ASA 81mg  daily.  He expressed understanding.   Signed, Arvil Chaco, PA-C 06/05/2020, 11:07 AM

## 2020-06-05 NOTE — Progress Notes (Addendum)
Progress Note  Patient Name: Craig [name] Date of Encounter: 06/05/2020  Primary Cardiologist: Dr. Lovena Le  Subjective   No CP or SOB.  Inpatient Medications    Scheduled Meds: . bisacodyl  10 mg Rectal q morning - 10a  . Chlorhexidine Gluconate Cloth  6 each Topical Daily  . donepezil  10 mg Oral QHS  . enoxaparin (LOVENOX) injection  40 mg Subcutaneous Q24H  . feeding supplement  237 mL Oral TID BM  . losartan  25 mg Oral Daily  . mouth rinse  15 mL Mouth Rinse BID  . metoprolol succinate  25 mg Oral Daily  . multivitamin with minerals  1 tablet Oral Daily  . pantoprazole  40 mg Oral Daily  . sodium chloride flush  10-40 mL Intracatheter Q12H   Continuous Infusions: . sodium chloride Stopped (06/01/20 0630)   PRN Meds: acetaminophen **OR** acetaminophen, LORazepam **OR** LORazepam, metoprolol tartrate, ondansetron **OR** ondansetron (ZOFRAN) IV, sodium chloride flush, sodium phosphate   Vital Signs    Vitals:   06/04/20 1733 06/04/20 1942 06/05/20 0452 06/05/20 0812  BP:  (!) 160/90 132/71 120/69  Pulse: 94 84 65 63  Resp:  16 16   Temp:  99 F (37.2 C) 97.8 F (36.6 C) 98.6 F (37 C)  TempSrc:  Oral Oral Oral  SpO2:  98% 99% 99%  Weight:   75.2 kg     Intake/Output Summary (Last 24 hours) at 06/05/2020 0944 Last data filed at 06/05/2020 0647 Gross per 24 hour  Intake 600 ml  Output 1775 ml  Net -1175 ml   Last 3 Weights 06/05/2020 06/04/2020 06/03/2020  Weight (lbs) 165 lb 11.2 oz 167 lb 4.8 oz 168 lb 10.4 oz  Weight (kg) 75.161 kg 75.887 kg 76.5 kg      Telemetry    Not on telemetry - Personally Reviewed  ECG    No new tracings - Personally Reviewed  Physical Exam   GEN: No acute distress.   Neck: No JVD Cardiac: IRIR, no murmurs, rubs, or gallops.  Respiratory: Clear to auscultation bilaterally. GI: Soft, nontender, non-distended  MS: No edema; No deformity. Neuro:  Nonfocal  Psych: Normal affect   Labs    High  Sensitivity Troponin:   Recent Labs  Lab 05/26/20 2024 05/26/20 2230  TROPONINIHS 54* 54*      Chemistry Recent Labs  Lab 06/02/20 0550 06/03/20 0622 06/04/20 0508 06/05/20 0515  NA 138 135 134* 136  K 3.6 3.8 4.2 3.6  CL 104 103 104 102  CO2 27 26 25 27   GLUCOSE 110* 106* 99 85  BUN 9 11 16 15   CREATININE 0.70 0.71 0.72 0.77  CALCIUM 8.2* 8.2* 7.8* 8.0*  PROT 6.2* 6.3*  --   --   ALBUMIN 2.0* 2.0*  --   --   AST 63* 54*  --   --   ALT 42 42  --   --   ALKPHOS 54 59  --   --   BILITOT 0.8 0.6  --   --   GFRNONAA >60 >60 >60 >60  ANIONGAP 7 6 5 7      Hematology Recent Labs  Lab 05/31/20 0620 06/01/20 0921 06/02/20 0550  WBC 7.5 8.7 7.8  RBC 3.91* 3.74* 3.88*  HGB 12.0* 11.8* 12.3*  HCT 38.5* 35.4* 37.6*  MCV 98.5 94.7 96.9  MCH 30.7 31.6 31.7  MCHC 31.2 33.3 32.7  RDW 12.4 12.5 12.5  PLT 147* 157 160  BNPNo results for input(s): BNP, PROBNP in the last 168 hours.   DDimer No results for input(s): DDIMER in the last 168 hours.   Radiology    No results found.  Cardiac Studies   Echo 06/01/20 1. Left ventricular ejection fraction, by estimation, is 25 to 30%. The  left ventricle has severely decreased function. The left ventricle  demonstrates global hypokinesis. There is moderate left ventricular  hypertrophy. Left ventricular diastolic  parameters are indeterminate.  2. Right ventricular systolic function is mildly reduced. The right  ventricular size is normal. Tricuspid regurgitation signal is inadequate  for assessing PA pressure.  3. Left atrial size was mildly dilated.  4. Right atrial size was mildly dilated.  5. A small pericardial effusion is present. The pericardial effusion is  circumferential.  6. The mitral valve is normal in structure. Mild to moderate mitral valve  regurgitation. There is mild holosystolic prolapse of of the mitral valve.  7. The aortic valve is tricuspid. Aortic valve regurgitation is not  visualized.  No aortic stenosis is present.  8. Aortic dilatation noted. There is mild dilatation of the aortic root,  measuring 40 mm.   Patient Profile     79 y.o. male with history of remote MI, chronic atrial flutter, TIA, PVD, hypertension, hyperlipidemia, and admitted with abdominal pain found to have incarcerated right inguinal hernia s/p surgical repair with postoperative course complicated by shortness of breath and cardiology following for atrial flutter with RVR.  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  Atrial flutter dates back to 2017 on chart.  He remains in atrial tachycardia with controlled ventricular response.  Echo EF 25-30%.  Continue current rate control with metoprolol 25mg  daily.   Continue to avoid diltiazem given reduced EF as above.   No OAC given risk of fall, mental status, and recent surgery.   Maintain electrolytes at goal. TSH wnl.   Will need follow-up with EP.  CAD involving the native coronary arteries --Denies chest pain. High-sensitivity troponin 54x2.  EKG without acute ST/T changes.  Not consistent with ACS.  Consider supply demand ischemia in the setting of rapid ventricular rate and systolic heart failure.  --ASA can be started if surgery OK to do so. Given surgery has signed off, will message surgery PA.  --Continue BB and recently added losartan. --OP follow-up.   HFrEF --Appears euvolemic.  Echo as above with EF 25-30%. Continue to monitor I/os, daily standing weights.  Daily BMET.  Net -1.3L with wt stable. Continue current BB and Losartan. Not currently on a diuretic. No indication for start of diuretic at this time.  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 Surgery signed off. Will reach out to surgery PA regarding start of ASA.  For questions or updates, please contact [place] Please  consult [email or web address] for contact info under        Signed, Arvil Chaco, PA-C  06/05/2020, 9:44 AM

## 2020-06-05 NOTE — Consult Note (Addendum)
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 Patient subsequently found to have incarcerated right inguinal hernia with strangulated (gangrenous) knuckle of small bowel with perforation, SBO, and right spermatic cord abscess. Pt underwent exploratory laparotomy, right inguinal hernia repair, small bowel resection, and orchiectomy on 12/9. Pt had absent bowel sounds prevously, however small bowel sounds heard so pt is currently on clear, liquid diet. TPN stopped 12/17.   Goal of Therapy:  WNL  Plan:  No replacement needed at this time. Pharmacy will sign off. Please re-consult if needed.   Oswald Hillock ,PharmD Clinical Pharmacist 06/05/2020 7:36 AM

## 2020-06-05 NOTE — Progress Notes (Signed)
PROGRESS NOTE    Craig [name]  ENI:[id number] DOB: [birth year] DOA: 05/26/2020 PCP: [name], MD    Assessment & Plan:   Principal Problem:   SBO (small bowel obstruction) ([place]) Active Problems:   CAD (coronary artery disease), native coronary artery   History of coronary artery stent placement   Essential hypertension   Dementia with behavioral disturbance (HCC)   Atrial flutter with rapid ventricular response (HCC)   Hypernatremia   Nicotine dependence   HFrEF (heart failure with reduced ejection fraction) ([place])   Incarcerated right inguinal hernia: w/ strangulated(gangrenous)knuckle of small bowelwith perforation, small bowel obstruction and right spermatic cord abscess. Weaned off of TPN 06/04/20. Completed course of abxs. General surg signed off and pt should f/u outpatient in 2 weeks   A. fib /flutter: w/ RVR. Likely PAF. Continue on metoprolol. Not a good candidate for anticoagulation secondary to above and high fall risk   Likely acute on chronic systolic CHF: echo shows [id number]%, LV demonstrates global hypokinesis & diastolic parameters are indeterminate. Started on po metoprolol. May need ischemic work-up as per cardio   Acute metabolic encephalopathy: w/ hx of dementia & behavorial disturbance. Continue w/ supportive care    Elevated troponin: likely secondary to demand ischemia. May need ischemic work-up as per cardio  Hypokalemia: within normal limits   Hypophosphatemia: WNL today  Dementia: continue on donepezil   DVT prophylaxis: lovenox  Code Status: full  Family Communication:  Disposition Plan: PT/OT recs SNF    Status is: Inpatient  Remains inpatient appropriate because:Ongoing diagnostic testing needed not appropriate for outpatient work up, Unsafe d/c plan, IV treatments appropriate due to intensity of illness or inability to take PO and Inpatient level of care appropriate due to severity of illness   Dispo: The patient is from:  Home              Anticipated d/c is to: SNF              Anticipated d/c date is: whenever a SNF bed is available               Patient currently medically stable for d/c         Consultants:   Cardio  General surg    Procedures:    Antimicrobials:   Subjective: Pt is oriented to person only. Pt c/o fatigue   Objective: Vitals:   06/04/20 1510 06/04/20 1733 06/04/20 1942 06/05/20 0452  BP: (!) 142/83  (!) 160/90 132/71  Pulse: 62 94 84 65  Resp: 17  16 16   Temp: (!) 97.4 F (36.3 C)  99 F (37.2 C) 97.8 F (36.6 C)  TempSrc: Oral  Oral Oral  SpO2: 100%  98% 99%  Weight:    75.2 kg    Intake/Output Summary (Last 24 hours) at 06/05/2020 0749 Last data filed at 06/05/2020 0647 Gross per 24 hour  Intake 600 ml  Output 1975 ml  Net -1375 ml   Filed Weights   06/03/20 0611 06/04/20 0213 06/05/20 0452  Weight: 76.5 kg 75.9 kg 75.2 kg    Examination:  General exam: Appears calm & comfortable  Respiratory system: clear breath sounds b/l. No wheezes, rales  Cardiovascular system: S1 & S2+. No rubs or clicks  Gastrointestinal system: Abdomen is nondistended, soft and nontender. Normal bowel sounds  Central nervous system: Alert and oriented to person only. Moves all extremities  Psychiatry: Judgement and insight appear abnormal. Flat mood and affect  Data Reviewed: I have personally reviewed following labs and imaging studies  CBC: Recent Labs  Lab 05/30/20 0620 05/31/20 0620 06/01/20 0921 06/02/20 0550  WBC 8.9 7.5 8.7 7.8  NEUTROABS  --   --   --  6.5  HGB 10.3* 12.0* 11.8* 12.3*  HCT 32.6* 38.5* 35.4* 37.6*  MCV 98.8 98.5 94.7 96.9  PLT 149* 147* 157 017   Basic Metabolic Panel: Recent Labs  Lab 05/31/20 0620 06/01/20 0921 06/02/20 0550 06/03/20 0622 06/04/20 0508 06/05/20 0515  NA 139 139 138 135 134* 136  K 3.3* 3.3* 3.6 3.8 4.2 3.6  CL 104 104 104 103 104 102  CO2 28 27 27 26 25 27   GLUCOSE 116* 121* 110* 106* 99 85  BUN  10 8 9 11 16 15   CREATININE 0.75 0.85 0.70 0.71 0.72 0.77  CALCIUM 8.5* 8.3* 8.2* 8.2* 7.8* 8.0*  MG 1.8 2.0 1.9 1.9 1.9  --   PHOS 1.4* 1.7* 2.3* 2.0* 1.7* 3.1   GFR: Estimated Creatinine Clearance: 82.3 mL/min (by C-G formula based on SCr of 0.77 mg/dL). Liver Function Tests: Recent Labs  Lab 06/02/20 0550 06/03/20 0622  AST 63* 54*  ALT 42 42  ALKPHOS 54 59  BILITOT 0.8 0.6  PROT 6.2* 6.3*  ALBUMIN 2.0* 2.0*   No results for input(s): LIPASE, AMYLASE in the last 168 hours. No results for input(s): AMMONIA in the last 168 hours. Coagulation Profile: No results for input(s): INR, PROTIME in the last 168 hours. Cardiac Enzymes: No results for input(s): CKTOTAL, CKMB, CKMBINDEX, TROPONINI in the last 168 hours. BNP (last 3 results) No results for input(s): PROBNP in the last 8760 hours. HbA1C: No results for input(s): HGBA1C in the last 72 hours. CBG: Recent Labs  Lab 06/04/20 1133 06/04/20 1747 06/04/20 2036 06/04/20 2339 06/05/20 0457  GLUCAP 123* 99 124* 90 83   Lipid Profile: No results for input(s): CHOL, HDL, LDLCALC, TRIG, CHOLHDL, LDLDIRECT in the last 72 hours. Thyroid Function Tests: No results for input(s): TSH, T4TOTAL, FREET4, T3FREE, THYROIDAB in the last 72 hours. Anemia Panel: No results for input(s): VITAMINB12, FOLATE, FERRITIN, TIBC, IRON, RETICCTPCT in the last 72 hours. Sepsis Labs: No results for input(s): PROCALCITON, LATICACIDVEN in the last 168 hours.  Recent Results (from the past 240 hour(s))  Blood culture (routine single)     Status: None   Collection Time: 05/26/20 10:30 PM   Specimen: BLOOD  Result Value Ref Range Status   Specimen Description BLOOD RIGHT ANTECUBITAL  Final   Special Requests   Final    BOTTLES DRAWN AEROBIC AND ANAEROBIC Blood Culture results may not be optimal due to an inadequate volume of blood received in culture bottles   Culture   Final    NO GROWTH 5 DAYS Performed at [facility], [address] [place], [place] [address]    Report Status 05/31/2020 FINAL  Final  Resp Panel by RT-PCR (Flu A&B, Covid) Nasopharyngeal Swab     Status: None   Collection Time: 05/27/20 12:37 AM   Specimen: Nasopharyngeal Swab; Nasopharyngeal(NP) swabs in vial transport medium  Result Value Ref Range Status   SARS Coronavirus 2 by RT PCR NEGATIVE NEGATIVE Final    Comment: (NOTE) SARS-CoV-2 target nucleic acids are NOT DETECTED.  The SARS-CoV-2 RNA is generally detectable in upper respiratory specimens during the acute phase of infection. The lowest concentration of SARS-CoV-2 viral copies this assay can detect is 138 copies/mL. A negative result does not preclude  SARS-Cov-2 infection and should not be used as the sole basis for treatment or other patient management decisions. A negative result may occur with  improper specimen collection/handling, submission of specimen other than nasopharyngeal swab, presence of viral mutation(s) within the areas targeted by this assay, and inadequate number of viral copies(<138 copies/mL). A negative result must be combined with clinical observations, patient history, and epidemiological information. The expected result is Negative.  Fact Sheet for Patients:  EntrepreneurPulse.com.au  Fact Sheet for Healthcare Providers:  IncredibleEmployment.be  This test is no t yet approved or cleared by the [place] FDA and  has been authorized for detection and/or diagnosis of SARS-CoV-2 by FDA under an Emergency Use Authorization (EUA). This EUA will remain  in effect (meaning this test can be used) for the duration of the COVID-19 declaration under Section 564(b)(1) of the Act, 21 U.S.C.section 360bbb-3(b)(1), unless the authorization is terminated  or revoked sooner.       Influenza A by PCR NEGATIVE NEGATIVE Final   Influenza B by PCR NEGATIVE NEGATIVE Final    Comment: (NOTE) The Xpert Xpress SARS-CoV-2/FLU/RSV  plus assay is intended as an aid in the diagnosis of influenza from Nasopharyngeal swab specimens and should not be used as a sole basis for treatment. Nasal washings and aspirates are unacceptable for Xpert Xpress SARS-CoV-2/FLU/RSV testing.  Fact Sheet for Patients: EntrepreneurPulse.com.au  Fact Sheet for Healthcare Providers: IncredibleEmployment.be  This test is not yet approved or cleared by the [place] FDA and has been authorized for detection and/or diagnosis of SARS-CoV-2 by FDA under an Emergency Use Authorization (EUA). This EUA will remain in effect (meaning this test can be used) for the duration of the COVID-19 declaration under Section 564(b)(1) of the Act, 21 U.S.C. section 360bbb-3(b)(1), unless the authorization is terminated or revoked.  Performed at [facility], [address] [place], [place] [address]   MRSA PCR Screening     Status: None   Collection Time: 05/27/20  5:00 AM   Specimen: Nasopharyngeal  Result Value Ref Range Status   MRSA by PCR NEGATIVE NEGATIVE Final    Comment:        The GeneXpert MRSA Assay (FDA approved for NASAL specimens only), is one component of a comprehensive MRSA colonization surveillance program. It is not intended to diagnose MRSA infection nor to guide or monitor treatment for MRSA infections. Performed at [facility], [address], [place], [place] [address]   Urine culture     Status: None   Collection Time: 05/27/20  5:43 AM   Specimen: In/Out Cath Urine  Result Value Ref Range Status   Specimen Description   Final    IN/OUT CATH URINE Performed at [facility], [address] [place], [place] [address]    Special Requests   Final    NONE Performed at [facility], [address], [place], [place] [address]    Culture   Final    NO GROWTH Performed at [facility], [place] [address], [place], [place] [address]     Report Status 05/28/2020 FINAL  Final         Radiology Studies: No results found.      Scheduled Meds: . bisacodyl  10 mg Rectal q morning - 10a  . Chlorhexidine Gluconate Cloth  6 each Topical Daily  . donepezil  10 mg Oral QHS  . enoxaparin (LOVENOX) injection  40 mg Subcutaneous Q24H  . feeding supplement  237 mL Oral  TID BM  . losartan  25 mg Oral Daily  . mouth rinse  15 mL Mouth Rinse BID  . metoprolol succinate  25 mg Oral Daily  . multivitamin with minerals  1 tablet Oral Daily  . pantoprazole  40 mg Oral Daily  . sodium chloride flush  10-40 mL Intracatheter Q12H   Continuous Infusions: . sodium chloride Stopped (06/01/20 0630)     LOS: 9 days    Time spent: 30 mins    Wyvonnia Dusky, MD [facility] Pager [phone]  If 7PM-7AM, please contact night-coverage 06/05/2020, 7:49 AM

## 2020-06-06 LAB — CBC
HCT: 30.6 % — ABNORMAL LOW (ref 39.0–52.0)
Hemoglobin: 9.9 g/dL — ABNORMAL LOW (ref 13.0–17.0)
MCH: 31.7 pg (ref 26.0–34.0)
MCHC: 32.4 g/dL (ref 30.0–36.0)
MCV: 98.1 fL (ref 80.0–100.0)
Platelets: 219 10*3/uL (ref 150–400)
RBC: 3.12 MIL/uL — ABNORMAL LOW (ref 4.22–5.81)
RDW: 12.6 % (ref 11.5–15.5)
WBC: 5.2 10*3/uL (ref 4.0–10.5)
nRBC: 0 % (ref 0.0–0.2)

## 2020-06-06 LAB — BASIC METABOLIC PANEL WITH GFR
Anion gap: 7 (ref 5–15)
BUN: 19 mg/dL (ref 8–23)
CO2: 26 mmol/L (ref 22–32)
Calcium: 8.1 mg/dL — ABNORMAL LOW (ref 8.9–10.3)
Chloride: 100 mmol/L (ref 98–111)
Creatinine, Ser: 0.71 mg/dL (ref 0.61–1.24)
GFR, Estimated: >60 mL/min
Glucose, Bld: 97 mg/dL (ref 70–99)
Potassium: 3.9 mmol/L (ref 3.5–5.1)
Sodium: 133 mmol/L — ABNORMAL LOW (ref 135–145)

## 2020-06-06 LAB — MAGNESIUM: Magnesium: 2.1 mg/dL (ref 1.7–2.4)

## 2020-06-06 NOTE — Progress Notes (Addendum)
Patient found sitting on floor.   Per patient did not hit his head, just slid down and landed on his bottom.   Noted a red mark on left shoulder and drainage and swelling from inguinal incision.   Vital signs collected, charge nurse and MD informed. Family notified (daughter Roselyn Reef)  Low bed ordered and mats placed on floor.      Per MD- will continue to monitor incision and patient

## 2020-06-06 NOTE — Progress Notes (Signed)
PROGRESS NOTE    Craig [name]  JKD:[id number] DOB: 11/09/1940 DOA: 05/26/2020 PCP: [name], MD    Assessment & Plan:   Principal Problem:   SBO (small bowel obstruction) ([place]) Active Problems:   CAD (coronary artery disease), native coronary artery   History of coronary artery stent placement   Essential hypertension   Dementia with behavioral disturbance (HCC)   Atrial flutter with rapid ventricular response (HCC)   Hypernatremia   Nicotine dependence   HFrEF (heart failure with reduced ejection fraction) ([place])   Incarcerated right inguinal hernia: w/ strangulated(gangrenous)knuckle of small bowelwith perforation, small bowel obstruction and right spermatic cord abscess. Weaned off of TPN 06/04/20. Completed course of abxs. General surg signed off and pt should f/u outpatient in 2 weeks   A. fib /flutter: w/ RVR. Likely PAF. Continue on metoprolol. Not a good candidate for anticoagulation secondary to above and high fall risk   Likely acute on chronic systolic CHF: echo shows KD98-33%, LV demonstrates global hypokinesis & diastolic parameters are indeterminate. Started on po metoprolol. May need ischemic work-up as per cardio   Acute metabolic encephalopathy: w/ hx of dementia and behavioral disturbance. Continue w/ supportive care   Elevated troponin: likely secondary to demand ischemia  Hypokalemia: WNL today   Hypophosphatemia: resolved  Dementia: continue on donepezil   DVT prophylaxis: lovenox  Code Status: full  Family Communication:  Disposition Plan: PT/OT recs SNF    Status is: Inpatient  Remains inpatient appropriate because:Ongoing diagnostic testing needed not appropriate for outpatient work up, Unsafe d/c plan, IV treatments appropriate due to intensity of illness or inability to take PO and Inpatient level of care appropriate due to severity of illness   Dispo: The patient is from: Home              Anticipated d/c is to: SNF               Anticipated d/c date is: whenever a SNF bed is available               Patient currently medically stable for d/c         Consultants:   Cardio  General surg    Procedures:    Antimicrobials:   Subjective: Pt c/o malaise   Objective: Vitals:   06/05/20 1701 06/05/20 2215 06/06/20 0345 06/06/20 0439  BP: 138/69 123/67  124/89  Pulse: 84 61  62  Resp:    18  Temp: 99 F (37.2 C) 98.7 F (37.1 C)  98.6 F (37 C)  TempSrc: Oral Oral  Oral  SpO2: 100% 100%  100%  Weight:   73.6 kg     Intake/Output Summary (Last 24 hours) at 06/06/2020 0728 Last data filed at 06/06/2020 0645 Gross per 24 hour  Intake 957 ml  Output 2575 ml  Net -1618 ml   Filed Weights   06/04/20 0213 06/05/20 0452 06/06/20 0345  Weight: 75.9 kg 75.2 kg 73.6 kg    Examination:  General exam: Appears calm and comfortable Respiratory system: clear breath sounds b/l  Cardiovascular system: S1 & S2+. No rubs or gallops Gastrointestinal system: Abdomen is nondistended, soft and nontender. Normal bowel sounds  Central nervous system: Alert and oriented. Moves all extremities  Psychiatry: Judgement and insight appear abnormal. Flat mood and affect     Data Reviewed: I have personally reviewed following labs and imaging studies  CBC: Recent Labs  Lab 05/31/20 0620 06/01/20 0921 06/02/20 0550 06/06/20 0453  WBC  7.5 8.7 7.8 5.2  NEUTROABS  --   --  6.5  --   HGB 12.0* 11.8* 12.3* 9.9*  HCT 38.5* 35.4* 37.6* 30.6*  MCV 98.5 94.7 96.9 98.1  PLT 147* 157 160 956   Basic Metabolic Panel: Recent Labs  Lab 06/01/20 0921 06/02/20 0550 06/03/20 0622 06/04/20 0508 06/05/20 0515 06/06/20 0453  NA 139 138 135 134* 136 133*  K 3.3* 3.6 3.8 4.2 3.6 3.9  CL 104 104 103 104 102 100  CO2 27 27 26 25 27 26   GLUCOSE 121* 110* 106* 99 85 97  BUN 8 9 11 16 15 19   CREATININE 0.85 0.70 0.71 0.72 0.77 0.71  CALCIUM 8.3* 8.2* 8.2* 7.8* 8.0* 8.1*  MG 2.0 1.9 1.9 1.9  --  2.1  PHOS 1.7*  2.3* 2.0* 1.7* 3.1  --    GFR: Estimated Creatinine Clearance: 80.5 mL/min (by C-G formula based on SCr of 0.71 mg/dL). Liver Function Tests: Recent Labs  Lab 06/02/20 0550 06/03/20 0622  AST 63* 54*  ALT 42 42  ALKPHOS 54 59  BILITOT 0.8 0.6  PROT 6.2* 6.3*  ALBUMIN 2.0* 2.0*   No results for input(s): LIPASE, AMYLASE in the last 168 hours. No results for input(s): AMMONIA in the last 168 hours. Coagulation Profile: No results for input(s): INR, PROTIME in the last 168 hours. Cardiac Enzymes: No results for input(s): CKTOTAL, CKMB, CKMBINDEX, TROPONINI in the last 168 hours. BNP (last 3 results) No results for input(s): PROBNP in the last 8760 hours. HbA1C: No results for input(s): HGBA1C in the last 72 hours. CBG: Recent Labs  Lab 06/04/20 1133 06/04/20 1747 06/04/20 2036 06/04/20 2339 06/05/20 0457  GLUCAP 123* 99 124* 90 83   Lipid Profile: Recent Labs    06/05/20 0515  CHOL 94  HDL 21*  LDLCALC 59  TRIG 68  CHOLHDL 4.5   Thyroid Function Tests: No results for input(s): TSH, T4TOTAL, FREET4, T3FREE, THYROIDAB in the last 72 hours. Anemia Panel: No results for input(s): VITAMINB12, FOLATE, FERRITIN, TIBC, IRON, RETICCTPCT in the last 72 hours. Sepsis Labs: No results for input(s): PROCALCITON, LATICACIDVEN in the last 168 hours.  No results found for this or any previous visit (from the past 240 hour(s)).       Radiology Studies: No results found.      Scheduled Meds: . aspirin  81 mg Oral Daily  . bisacodyl  10 mg Rectal q morning - 10a  . Chlorhexidine Gluconate Cloth  6 each Topical Daily  . donepezil  10 mg Oral QHS  . enoxaparin (LOVENOX) injection  40 mg Subcutaneous Q24H  . feeding supplement  237 mL Oral TID BM  . losartan  25 mg Oral Daily  . mouth rinse  15 mL Mouth Rinse BID  . metoprolol succinate  25 mg Oral Daily  . multivitamin with minerals  1 tablet Oral Daily  . pantoprazole  40 mg Oral Daily  . sodium chloride flush   10-40 mL Intracatheter Q12H   Continuous Infusions: . sodium chloride Stopped (06/01/20 0630)     LOS: 10 days    Time spent: 30 mins    Wyvonnia Dusky, MD [facility] Pager [phone]  If 7PM-7AM, please contact night-coverage 06/06/2020, 7:28 AM

## 2020-06-06 NOTE — Progress Notes (Addendum)
Progress Note  Patient Name: Craig [name] Date of Encounter: 06/06/2020  [facility] Cardiologist: Dr. Lovena Le  Subjective   Denies chest pain or shortness of breath.  Feels well, eating breakfast.  Inpatient Medications    Scheduled Meds:  aspirin  81 mg Oral Daily   bisacodyl  10 mg Rectal q morning - 10a   Chlorhexidine Gluconate Cloth  6 each Topical Daily   donepezil  10 mg Oral QHS   enoxaparin (LOVENOX) injection  40 mg Subcutaneous Q24H   feeding supplement  237 mL Oral TID BM   losartan  25 mg Oral Daily   mouth rinse  15 mL Mouth Rinse BID   metoprolol succinate  25 mg Oral Daily   multivitamin with minerals  1 tablet Oral Daily   pantoprazole  40 mg Oral Daily   sodium chloride flush  10-40 mL Intracatheter Q12H   Continuous Infusions:  sodium chloride Stopped (06/01/20 0630)   PRN Meds: acetaminophen **OR** acetaminophen, LORazepam **OR** LORazepam, metoprolol tartrate, ondansetron **OR** ondansetron (ZOFRAN) IV, sodium chloride flush, sodium phosphate   Vital Signs    Vitals:   06/05/20 2215 06/06/20 0345 06/06/20 0439 06/06/20 0807  BP: 123/67  124/89 129/83  Pulse: 61  62 62  Resp:   18   Temp: 98.7 F (37.1 C)  98.6 F (37 C) 97.8 F (36.6 C)  TempSrc: Oral  Oral Oral  SpO2: 100%  100% 100%  Weight:  73.6 kg      Intake/Output Summary (Last 24 hours) at 06/06/2020 1135 Last data filed at 06/06/2020 0645 Gross per 24 hour  Intake 717 ml  Output 2575 ml  Net -1858 ml   Last 3 Weights 06/06/2020 06/05/2020 06/04/2020  Weight (lbs) 162 lb 4.8 oz 165 lb 11.2 oz 167 lb 4.8 oz  Weight (kg) 73.619 kg 75.161 kg 75.887 kg      Telemetry    Atrial flutter, occasional PVCs, no evidence of NSVT, heart rate 60s- Personally Reviewed  ECG    No new ECG obtained- Personally Reviewed  Physical Exam   GEN: Well nourished, eating breakfast  HEENT: Normal  CARDIAC: Irregular irregular  RESPIRATORY: Clear anteriorly   ABDOMEN: Soft, distended, surgical staples noted  MUSCULOSKELETAL:  No edema; No deformity   SKIN: Warm and dry  NEUROLOGIC: Alert   PSYCHIATRIC: Difficult to assess   Labs    High Sensitivity Troponin:   Recent Labs  Lab 05/26/20 2024 05/26/20 2230  TROPONINIHS 54* 54*      Chemistry Recent Labs  Lab 06/02/20 0550 06/03/20 0622 06/04/20 0508 06/05/20 0515 06/06/20 0453  NA 138 135 134* 136 133*  K 3.6 3.8 4.2 3.6 3.9  CL 104 103 104 102 100  CO2 27 26 25 27 26   GLUCOSE 110* 106* 99 85 97  BUN 9 11 16 15 19   CREATININE 0.70 0.71 0.72 0.77 0.71  CALCIUM 8.2* 8.2* 7.8* 8.0* 8.1*  PROT 6.2* 6.3*  --   --   --   ALBUMIN 2.0* 2.0*  --   --   --   AST 63* 54*  --   --   --   ALT 42 42  --   --   --   ALKPHOS 54 59  --   --   --   BILITOT 0.8 0.6  --   --   --   GFRNONAA >60 >60 >60 >60 >60  ANIONGAP 7 6 5 7  7  Hematology Recent Labs  Lab 06/01/20 0921 06/02/20 0550 06/06/20 0453  WBC 8.7 7.8 5.2  RBC 3.74* 3.88* 3.12*  HGB 11.8* 12.3* 9.9*  HCT 35.4* 37.6* 30.6*  MCV 94.7 96.9 98.1  MCH 31.6 31.7 31.7  MCHC 33.3 32.7 32.4  RDW 12.5 12.5 12.6  PLT 157 160 219    BNPNo results for input(s): BNP, PROBNP in the last 168 hours.   DDimer No results for input(s): DDIMER in the last 168 hours.   Radiology    No results found.  Cardiac Studies   Echo 05/2020 1. Left ventricular ejection fraction, by estimation, is 25 to 30%. The  left ventricle has severely decreased function. The left ventricle  demonstrates global hypokinesis. There is moderate left ventricular  hypertrophy. Left ventricular diastolic  parameters are indeterminate.  2. Right ventricular systolic function is mildly reduced. The right  ventricular size is normal. Tricuspid regurgitation signal is inadequate  for assessing PA pressure.  3. Left atrial size was mildly dilated.  4. Right atrial size was mildly dilated.  5. A small pericardial effusion is present. The  pericardial effusion is  circumferential.  6. The mitral valve is normal in structure. Mild to moderate mitral valve  regurgitation. There is mild holosystolic prolapse of of the mitral valve.  7. The aortic valve is tricuspid. Aortic valve regurgitation is not  visualized. No aortic stenosis is present.  8. Aortic dilatation noted. There is mild dilatation of the aortic root,  measuring 40 mm.  Patient Profile     79 y.o. male with history of CAD, chronic atrial flutter, TIA, hypertension, hyperlipidemia presenting with abdominal pain found to have an incarcerated right inguinal hernia now status post surgical repair.  Being seen for a flutter with RVR.   Assessment & Plan    1.  Atrial flutter  -Heart rate controlled  -Toprol-XL 25mg  qd -CHA2DS2-VASc score 6.   -Not on anticoagulation due to mental status, fall risk.    2.  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  Hx of CAD, remote stent in 1990s  -Aspirin 81 mg, LDL at goal 59 off statin   Continue current cardiac medications, telemetry reviewed from 7 AM to noon today, no evidence of sustained VT or nonsustained VT.  Cardiology will sign off, please let us know if additional input is needed.  Greater than 50% was spent in counseling and coordination of care with patient  Total encounter time 35 minutes        Signed, Kate Sable, MD  06/06/2020, 11:35 AM

## 2020-06-07 LAB — CBC
HCT: 30 % — ABNORMAL LOW (ref 39.0–52.0)
Hemoglobin: 9.9 g/dL — ABNORMAL LOW (ref 13.0–17.0)
MCH: 31.9 pg (ref 26.0–34.0)
MCHC: 33 g/dL (ref 30.0–36.0)
MCV: 96.8 fL (ref 80.0–100.0)
Platelets: 242 10*3/uL (ref 150–400)
RBC: 3.1 MIL/uL — ABNORMAL LOW (ref 4.22–5.81)
RDW: 12.9 % (ref 11.5–15.5)
WBC: 4.6 10*3/uL (ref 4.0–10.5)
nRBC: 0 % (ref 0.0–0.2)

## 2020-06-07 LAB — BASIC METABOLIC PANEL
Anion gap: 7 (ref 5–15)
BUN: 20 mg/dL (ref 8–23)
CO2: 25 mmol/L (ref 22–32)
Calcium: 8 mg/dL — ABNORMAL LOW (ref 8.9–10.3)
Chloride: 104 mmol/L (ref 98–111)
Creatinine, Ser: 0.68 mg/dL (ref 0.61–1.24)
GFR, Estimated: >60 mL/min (ref >60)
Glucose, Bld: 94 mg/dL (ref 70–99)
Potassium: 3.6 mmol/L (ref 3.5–5.1)
Sodium: 136 mmol/L (ref 135–145)

## 2020-06-07 NOTE — TOC Progression Note (Signed)
Transition of Care [facility]) - Progression Note    Patient Details  Name: Craig [name] MRN: [id number] Date of Birth: 02-12-1941  [facility]) CM/SW [place], RN Phone Number: 06/07/2020, 10:30 AM  Clinical Narrative:   Attempted to call patient's daughter to discuss SNF placement process, no answer left voice message to return call.         Expected Discharge Plan and Services                                                 Social Determinants of Health (SDOH) Interventions    Readmission Risk Interventions No flowsheet data found.

## 2020-06-07 NOTE — TOC Progression Note (Addendum)
Transition of Care [facility]) - Progression Note    Patient Details  Name: Craig [name] MRN: [id number] Date of Birth: [birth year]  Transition of Care [facility]) CM/SW [place], LCSW Phone Number: 06/07/2020, 12:04 PM  Clinical Narrative: No bed offers so far. Expanded search to [facility].    3:36 pm: No bed offers this afternoon. 10 denials, 7 still pending.  Expected Discharge Plan and Services                                                 Social Determinants of Health (SDOH) Interventions    Readmission Risk Interventions No flowsheet data found.

## 2020-06-07 NOTE — Progress Notes (Signed)
PROGRESS NOTE    Craig [name]  PIR:[id number] DOB: [birth year] DOA: 05/26/2020 PCP: [name], MD    Assessment & Plan:   Principal Problem:   SBO (small bowel obstruction) ([place]) Active Problems:   CAD (coronary artery disease), native coronary artery   History of coronary artery stent placement   Essential hypertension   Dementia with behavioral disturbance (HCC)   Atrial flutter with rapid ventricular response (HCC)   Hypernatremia   Nicotine dependence   HFrEF (heart failure with reduced ejection fraction) ([place])   Incarcerated right inguinal hernia: w/ strangulated(gangrenous)knuckle of small bowelwith perforation, small bowel obstruction and right spermatic cord abscess. Weaned off of TPN 06/04/20. Completed course of abxs. General surg signed off and pt should f/u outpatient in 2 weeks   A. fib /flutter: w/ RVR. Likely PAF. Continue on metoprolol. Not a good candidate for anticoagulation secondary to above and high fall risk   Likely acute on chronic systolic CHF: echo shows YT01-60%, LV demonstrates global hypokinesis & diastolic parameters are indeterminate. Continue on metoprolol    Acute metabolic encephalopathy: w/ hx of dementia and behavioral disturbance. Continue w/ supportive care   Elevated troponin: likely secondary to demand ischemia  Hypokalemia: within normal limits   Hypophosphatemia: resolved  Dementia: continue on donepezil   DVT prophylaxis: lovenox  Code Status: full  Family Communication:  Disposition Plan: PT/OT recs SNF    Status is: Inpatient  Remains inpatient appropriate because:Ongoing diagnostic testing needed not appropriate for outpatient work up, Unsafe d/c plan, IV treatments appropriate due to intensity of illness or inability to take PO and Inpatient level of care appropriate due to severity of illness   Dispo: The patient is from: Home              Anticipated d/c is to: SNF              Anticipated d/c date is:  whenever a SNF bed is available               Patient currently medically stable for d/c         Consultants:   Cardio  General surg    Procedures:    Antimicrobials:   Subjective: Pt c/o fatigue. Pt does not remember falling yesterday   Objective: Vitals:   06/06/20 1709 06/06/20 1823 06/06/20 1958 06/07/20 0516  BP: 126/76 127/64 133/68 (!) 141/84  Pulse: 64 78 82 60  Resp:  19 16 16   Temp: 98.4 F (36.9 C) 97.6 F (36.4 C) 99.1 F (37.3 C) 98 F (36.7 C)  TempSrc: Oral Oral Oral Oral  SpO2: 100% 98% 100% 100%  Weight:        Intake/Output Summary (Last 24 hours) at 06/07/2020 0736 Last data filed at 06/06/2020 2150 Gross per 24 hour  Intake 487 ml  Output 0 ml  Net 487 ml   Filed Weights   06/04/20 0213 06/05/20 0452 06/06/20 0345  Weight: 75.9 kg 75.2 kg 73.6 kg    Examination:  General exam: Appears calm & comfortable.  Respiratory system: clear breath sounds b/l. No wheezes, rales  Cardiovascular system: S1/ S2+. No rubs or gallops Gastrointestinal system: Abdomen is nondistended, soft and nontender. Normal bowel sounds  Central nervous system: Alert and oriented. Moves all 4 extremities  Psychiatry: Judgement and insight appear abnormal. Flat mood and affect     Data Reviewed: I have personally reviewed following labs and imaging studies  CBC: Recent Labs  Lab 06/01/20 0921 06/02/20  5361 06/06/20 0453 06/07/20 0550  WBC 8.7 7.8 5.2 4.6  NEUTROABS  --  6.5  --   --   HGB 11.8* 12.3* 9.9* 9.9*  HCT 35.4* 37.6* 30.6* 30.0*  MCV 94.7 96.9 98.1 96.8  PLT 157 160 219 443   Basic Metabolic Panel: Recent Labs  Lab 06/01/20 0921 06/02/20 0550 06/03/20 0622 06/04/20 0508 06/05/20 0515 06/06/20 0453 06/07/20 0550  NA 139 138 135 134* 136 133* 136  K 3.3* 3.6 3.8 4.2 3.6 3.9 3.6  CL 104 104 103 104 102 100 104  CO2 27 27 26 25 27 26 25   GLUCOSE 121* 110* 106* 99 85 97 94  BUN 8 9 11 16 15 19 20   CREATININE 0.85 0.70 0.71 0.72  0.77 0.71 0.68  CALCIUM 8.3* 8.2* 8.2* 7.8* 8.0* 8.1* 8.0*  MG 2.0 1.9 1.9 1.9  --  2.1  --   PHOS 1.7* 2.3* 2.0* 1.7* 3.1  --   --    GFR: Estimated Creatinine Clearance: 80.5 mL/min (by C-G formula based on SCr of 0.68 mg/dL). Liver Function Tests: Recent Labs  Lab 06/02/20 0550 06/03/20 0622  AST 63* 54*  ALT 42 42  ALKPHOS 54 59  BILITOT 0.8 0.6  PROT 6.2* 6.3*  ALBUMIN 2.0* 2.0*   No results for input(s): LIPASE, AMYLASE in the last 168 hours. No results for input(s): AMMONIA in the last 168 hours. Coagulation Profile: No results for input(s): INR, PROTIME in the last 168 hours. Cardiac Enzymes: No results for input(s): CKTOTAL, CKMB, CKMBINDEX, TROPONINI in the last 168 hours. BNP (last 3 results) No results for input(s): PROBNP in the last 8760 hours. HbA1C: No results for input(s): HGBA1C in the last 72 hours. CBG: Recent Labs  Lab 06/04/20 1133 06/04/20 1747 06/04/20 2036 06/04/20 2339 06/05/20 0457  GLUCAP 123* 99 124* 90 83   Lipid Profile: Recent Labs    06/05/20 0515  CHOL 94  HDL 21*  LDLCALC 59  TRIG 68  CHOLHDL 4.5   Thyroid Function Tests: No results for input(s): TSH, T4TOTAL, FREET4, T3FREE, THYROIDAB in the last 72 hours. Anemia Panel: No results for input(s): VITAMINB12, FOLATE, FERRITIN, TIBC, IRON, RETICCTPCT in the last 72 hours. Sepsis Labs: No results for input(s): PROCALCITON, LATICACIDVEN in the last 168 hours.  No results found for this or any previous visit (from the past 240 hour(s)).       Radiology Studies: No results found.      Scheduled Meds: . aspirin  81 mg Oral Daily  . bisacodyl  10 mg Rectal q morning - 10a  . Chlorhexidine Gluconate Cloth  6 each Topical Daily  . donepezil  10 mg Oral QHS  . enoxaparin (LOVENOX) injection  40 mg Subcutaneous Q24H  . feeding supplement  237 mL Oral TID BM  . losartan  25 mg Oral Daily  . mouth rinse  15 mL Mouth Rinse BID  . metoprolol succinate  25 mg Oral Daily   . multivitamin with minerals  1 tablet Oral Daily  . pantoprazole  40 mg Oral Daily  . sodium chloride flush  10-40 mL Intracatheter Q12H   Continuous Infusions: . sodium chloride Stopped (06/01/20 0630)     LOS: 11 days    Time spent: 25 mins    Wyvonnia Dusky, MD [facility] Pager [phone]  If 7PM-7AM, please contact night-coverage 06/07/2020, 7:36 AM

## 2020-06-07 NOTE — NC FL2 (Signed)
[place] LEVEL OF CARE SCREENING TOOL     IDENTIFICATION  Patient Name: Craig [name] Birthdate: 02/23/1941 Sex: male Admission Date (Current Location): 05/26/2020  [place] and [place] Number:  Engineering geologist and Address:  [facility], [address], [place], [place] [address]      Provider Number: [id number]  Attending Physician Name and Address:  Wyvonnia Dusky, MD  Relative Name and Phone Number:  Damin Salido [phone]    Current Level of Care: Hospital Recommended Level of Care: [place] Prior Approval Number:    Date Approved/Denied:   PASRR Number: Manual review  Discharge Plan: SNF    Current Diagnoses: Patient Active Problem List   Diagnosis Date Noted  . HFrEF (heart failure with reduced ejection fraction) ([place])   . Atrial flutter with rapid ventricular response ([place]) 05/26/2020  . SBO (small bowel obstruction) ([place]) 05/26/2020  . Hypernatremia 05/26/2020  . Nicotine dependence 05/26/2020  . Protein-calorie malnutrition, severe 04/22/2020  . Dementia with behavioral disturbance ([place]) 04/21/2020  . Altered mental status 04/20/2020  . Atrial flutter ([place])   . Bradycardia   . CAD (coronary artery disease), native coronary artery 12/25/2017  . History of coronary artery stent placement 12/25/2017  . Hyperlipidemia 12/25/2017  . Former smoker 12/25/2017  . Essential hypertension 12/25/2017  . Memory loss 12/25/2017    Orientation RESPIRATION BLADDER Height & Weight     Self  Normal Incontinent,External catheter Weight: 162 lb 4.8 oz (73.6 kg) Height:     BEHAVIORAL SYMPTOMS/MOOD NEUROLOGICAL BOWEL NUTRITION STATUS   (None)  (Dementia) Continent Diet (Soft)  AMBULATORY STATUS COMMUNICATION OF NEEDS Skin   Limited Assist Verbally Bruising,Other (Comment),Surgical wounds (Cracking. No dressings on incisions (abdomen and scrotum).)                       Personal Care  Assistance Level of Assistance  Bathing,Feeding,Dressing Bathing Assistance: Limited assistance Feeding assistance: Limited assistance Dressing Assistance: Limited assistance     Functional Limitations Info  Sight,Hearing,Speech Sight Info: Adequate Hearing Info: Adequate Speech Info: Adequate    SPECIAL CARE FACTORS FREQUENCY  PT (By licensed PT),OT (By licensed OT)     PT Frequency: 5 x week OT Frequency: 5 x week            Contractures Contractures Info: Not present    Additional Factors Info  Code Status,Allergies Code Status Info: Full code Allergies Info: Penicillins           Current Medications (06/07/2020):  This is the current hospital active medication list Current Facility-Administered Medications  Medication Dose Route Frequency Provider Last Rate Last Admin  . 0.9 %  sodium chloride infusion  250 mL Intravenous Continuous Awilda Bill, NP   Stopped at 06/01/20 0630  . acetaminophen (TYLENOL) tablet 650 mg  650 mg Oral Q6H PRN Athena Masse, MD       Or  . acetaminophen (TYLENOL) suppository 650 mg  650 mg Rectal Q6H PRN Athena Masse, MD      . aspirin chewable tablet 81 mg  81 mg Oral Daily Marrianne Mood D, PA-C   81 mg at 06/07/20 0954  . bisacodyl (DULCOLAX) suppository 10 mg  10 mg Rectal q morning - 10a Ronny Bacon, MD   10 mg at 06/07/20 0956  . Chlorhexidine Gluconate Cloth 2 % PADS 6 each  6 each Topical Daily Flora Lipps, MD   6 each at 06/07/20  0957  . donepezil (ARICEPT) tablet 10 mg  10 mg Oral QHS Wyvonnia Dusky, MD   10 mg at 06/06/20 2148  . enoxaparin (LOVENOX) injection 40 mg  40 mg Subcutaneous Q24H Rosine Door, MD   40 mg at 06/07/20 0954  . feeding supplement (ENSURE ENLIVE / ENSURE PLUS) liquid 237 mL  237 mL Oral TID BM Wyvonnia Dusky, MD   237 mL at 06/07/20 0957  . LORazepam (ATIVAN) tablet 0.5 mg  0.5 mg Oral Q6H PRN Nolberto Hanlon, MD   0.5 mg at 06/04/20 0106   Or  . LORazepam (ATIVAN) injection  0.5 mg  0.5 mg Intravenous Q6H PRN Nolberto Hanlon, MD   0.5 mg at 06/01/20 1636  . losartan (COZAAR) tablet 25 mg  25 mg Oral Daily Wellington Hampshire, MD   25 mg at 06/07/20 0954  . MEDLINE mouth rinse  15 mL Mouth Rinse BID Nolberto Hanlon, MD   15 mL at 06/07/20 0957  . metoprolol succinate (TOPROL-XL) 24 hr tablet 25 mg  25 mg Oral Daily Wyvonnia Dusky, MD   25 mg at 06/07/20 0954  . metoprolol tartrate (LOPRESSOR) injection 5 mg  5 mg Intravenous Q6H PRN Lang Snow, NP      . multivitamin with minerals tablet 1 tablet  1 tablet Oral Daily Wyvonnia Dusky, MD   1 tablet at 06/07/20 0954  . ondansetron (ZOFRAN) tablet 4 mg  4 mg Oral Q6H PRN Athena Masse, MD       Or  . ondansetron [facility]) injection 4 mg  4 mg Intravenous Q6H PRN Athena Masse, MD      . pantoprazole (PROTONIX) EC tablet 40 mg  40 mg Oral Daily Deatra Robinson B, RPH   40 mg at 06/07/20 0954  . sodium chloride flush (NS) 0.9 % injection 10-40 mL  10-40 mL Intracatheter Q12H Nolberto Hanlon, MD   10 mL at 06/07/20 0957  . sodium chloride flush (NS) 0.9 % injection 10-40 mL  10-40 mL Intracatheter PRN Nolberto Hanlon, MD   10 mL at 06/01/20 2117  . sodium phosphate (FLEET) 7-19 GM/118ML enema 1 enema  1 enema Rectal Daily PRN Ronny Bacon, MD         Discharge Medications: Please see discharge summary for a list of discharge medications.  Relevant Imaging Results:  Relevant Lab Results:   Additional Information SS#: [id number]  Candie Chroman, LCSW

## 2020-06-07 NOTE — Plan of Care (Signed)

## 2020-06-07 NOTE — Evaluation (Signed)
Occupational Therapy Evaluation Patient Details Name: Berley Gambrell MRN: [id number] DOB: July 07, 1940 Today's Date: 06/07/2020    History of Present Illness 79 y.o. male with medical history significant for A. fib/flutter not currently on anticoagulation, CAD, HTN, HLD, TIA, and memory loss.  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   Clinical Impression   Mr. Dematteo presents to OT with impaired cognition and generalized weakness that impacts his ability to safely and independently engage in functional tasks.  Pt received in room sitting EOB with his bed alarm going off.  Pt reports he was trying to use the restroom when the alarm went off and he was unable to make it to the toilet before urinating.  OTR provided total assist to clean room and pt.  OTR educated pt on fall and safety precautions (I.e. calling nurse to get out of bed) and pt verbalized understanding.  Nursing aware of pt's impulsive movement.  Mr. Hennes was agreeable to OT evaluation but was limited by decreased orientation.  He was unable to provide any details of his living situation or PLOF (per chart, pt lives alone and receives assistance from family for transportation).  Currently, pt requires setup-min assist for seated ADLs including grooming, feeding, upper body dressing, and bathing.  OTR provided min assist for pt in lower body dressing.  Functional transfers deferred due to pt's inability to consistently follow one-step commands.  Per PT report, pt requires minimal physical assist in functional mobility with RW, but has required +2 assist for safety 2/2 pt's impulsivity.  Mr. Ehle will continue to benefit from skilled OT services in acute setting to address cognition, safety awareness, functional strengthening, and safety and independence in ADLs.  SNF is most appropriate discharge  recommendation given limited assistance in home environment and poor safety awareness.    Follow Up Recommendations  SNF    Equipment Recommendations  Other (comment) (defer to next level of care)    Recommendations for Other Services       Precautions / Restrictions Precautions Precautions: Fall Restrictions Weight Bearing Restrictions: No      Mobility Bed Mobility Overal bed mobility: Needs Assistance Bed Mobility: Supine to Sit;Sit to Supine     Supine to sit: Min guard Sit to supine: Min guard        Transfers Overall transfer level: Needs assistance                    Balance Overall balance assessment: Needs assistance   Sitting balance-Leahy Scale: Good                                     ADL either performed or assessed with clinical judgement   ADL Overall ADL's : Needs assistance/impaired Eating/Feeding: Set up;Sitting Eating/Feeding Details (indicate cue type and reason): OTR provided setup assist for pt to eat while seated EOB, provided assist to open containers but no assist for self-feeding Grooming: Set up;Sitting Grooming Details (indicate cue type and reason): Pt washed face while seated EOB with setup assist. Upper Body Bathing: Set up;Sitting   Lower Body Bathing: Set up;Sitting/lateral leans Lower Body Bathing Details (indicate cue type and reason): Pt able to wash B feet while seated EOB with setup assist from OTR Upper Body Dressing : Set up;Sitting Upper Body Dressing Details (indicate cue type  and reason): Pt able to don gown with setup assist from OTR in seated position Lower Body Dressing: Minimal assistance;Sitting/lateral leans Lower Body Dressing Details (indicate cue type and reason): OTR provided assist to adjust sock as pt donned while seated EOB, pt able to doff/partially don socks   Toilet Transfer Details (indicate cue type and reason): not assessed 2/2 pt's decreased ability to follow one step  commands         Functional mobility during ADLs: +2 for safety/equipment;Minimal assistance General ADL Comments: Pt generally requires setup-min assist for seated ADLs, did not assess functional transfers due to decreased ability to follow one-step commands.  Per PT report, pt requires min physical assist but +2 assist for safety due to pt impulsivity.     Vision Patient Visual Report: No change from baseline       Perception     Praxis      Pertinent Vitals/Pain Pain Assessment: No/denies pain     Hand Dominance Right   Extremity/Trunk Assessment Upper Extremity Assessment Upper Extremity Assessment: Generalized weakness   Lower Extremity Assessment Lower Extremity Assessment: Generalized weakness       Communication Communication Communication: Expressive difficulties   Cognition Arousal/Alertness: Awake/alert Behavior During Therapy: WFL for tasks assessed/performed Overall Cognitive Status: No family/caregiver present to determine baseline cognitive functioning                                 General Comments: Disoriented to self, situation, setting, and date.  Requires extra time/cueing to follow one-step commands.   General Comments       Exercises Other Exercises Other Exercises: provided education re: OT role and plan of care, fall and safety precautions, self care.  provided setup assist for seated UB ADLs and min assist for donning socks   Shoulder Instructions      Home Living Family/patient expects to be discharged to:: Unsure                                 Additional Comments: Pt unable to state details of home setup, but per chart, pt lives alone and receives assistance from family for transportation      Prior Functioning/Environment          Comments: Pt unable to give cogent history/PLOF/etc        OT Problem List: Decreased strength;Decreased activity tolerance;Impaired balance (sitting and/or  standing);Decreased cognition;Decreased safety awareness;Decreased knowledge of use of DME or AE;Decreased knowledge of precautions      OT Treatment/Interventions: Self-care/ADL training;Therapeutic exercise;Energy conservation;DME and/or AE instruction;Therapeutic activities;Cognitive remediation/compensation;Patient/family education;Balance training    OT Goals(Current goals can be found in the care plan section) Acute Rehab OT Goals Patient Stated Goal: pt unable to state a goal OT Goal Formulation: With patient Time For Goal Achievement: 06/21/20 Potential to Achieve Goals: Good  OT Frequency: Min 1X/week   Barriers to D/C: Decreased caregiver support;Inaccessible home environment          Co-evaluation              AM-PAC OT "6 Clicks" Daily Activity     Outcome Measure Help from another person eating meals?: A Little Help from another person taking care of personal grooming?: A Little Help from another person toileting, which includes using toliet, bedpan, or urinal?: A Lot Help from another person bathing (including washing, rinsing, drying)?: A  Lot Help from another person to put on and taking off regular upper body clothing?: A Little Help from another person to put on and taking off regular lower body clothing?: A Little 6 Click Score: 16   End of Session Nurse Communication: Other (comment) (nursing aware pt left seated EOB eating lunch)  Activity Tolerance: Patient tolerated treatment well Patient left: in bed;with call bell/phone within reach;with bed alarm set  OT Visit Diagnosis: Other abnormalities of gait and mobility (R26.89);Muscle weakness (generalized) (M62.81);Other symptoms and signs involving cognitive function                Time: 1345-1425 OT Time Calculation (min): 40 min Charges:  OT General Charges $OT Visit: 1 Visit OT Evaluation $OT Eval Moderate Complexity: 1 Mod OT Treatments $Self Care/Home Management : 23-37 mins  Myrtie Hawk  Kiala Faraj, OTR/L 06/07/20, 4:25 PM

## 2020-06-07 NOTE — TOC Progression Note (Addendum)
Transition of Care [facility]) - Progression Note    Patient Details  Name: Craig [name] MRN: [id number] Date of Birth: [birth year]  Transition of Care [facility]) CM/SW [place], RN Phone Number: 06/07/2020, 11:55 AM  Clinical Narrative:   Per PASRR requests, H&P with PT notes sent electronically in attempt to get PASSR approval for SNF placement.  PASSR under level 2 review at this time.         Expected Discharge Plan and Services                                                 Social Determinants of Health (SDOH) Interventions    Readmission Risk Interventions No flowsheet data found.

## 2020-06-08 LAB — CBC
HCT: 31.6 % — ABNORMAL LOW (ref 39.0–52.0)
Hemoglobin: 10.3 g/dL — ABNORMAL LOW (ref 13.0–17.0)
MCH: 31.7 pg (ref 26.0–34.0)
MCHC: 32.6 g/dL (ref 30.0–36.0)
MCV: 97.2 fL (ref 80.0–100.0)
Platelets: 260 10*3/uL (ref 150–400)
RBC: 3.25 MIL/uL — ABNORMAL LOW (ref 4.22–5.81)
RDW: 13 % (ref 11.5–15.5)
WBC: 4.6 10*3/uL (ref 4.0–10.5)
nRBC: 0 % (ref 0.0–0.2)

## 2020-06-08 LAB — GLUCOSE, CAPILLARY: Glucose-Capillary: 77 mg/dL (ref 70–99)

## 2020-06-08 LAB — BASIC METABOLIC PANEL
Anion gap: 6 (ref 5–15)
BUN: 18 mg/dL (ref 8–23)
CO2: 27 mmol/L (ref 22–32)
Calcium: 8.1 mg/dL — ABNORMAL LOW (ref 8.9–10.3)
Chloride: 105 mmol/L (ref 98–111)
Creatinine, Ser: 0.81 mg/dL (ref 0.61–1.24)
GFR, Estimated: >60 mL/min (ref >60)
Glucose, Bld: 131 mg/dL — ABNORMAL HIGH (ref 70–99)
Potassium: 3.5 mmol/L (ref 3.5–5.1)
Sodium: 138 mmol/L (ref 135–145)

## 2020-06-08 NOTE — Care Management Important Message (Signed)
Important Message  Patient Details  Name: Craig [name] MRN: [id number] Date of Birth: 11-10-1940   Medicare Important Message Given:  Yes     Dannette Barbara 06/08/2020, 11:03 AM

## 2020-06-08 NOTE — TOC Progression Note (Addendum)
Transition of Care [facility]) - Progression Note    Patient Details  Name: Craig [name] MRN: [id number] Date of Birth: 15-May-1941  Transition of Care [facility]) CM/SW [place], LCSW Phone Number: 06/08/2020, 8:32 AM  Clinical Narrative: No bed offers this morning. Expanded search to other surrounding counties.    1:28 pm: Bed offers from [facility] in [place] and [place] [facility] in [place]. Left voicemail for [facility] admissions coordinator this morning. No call back yet. CSW tried calling again x2 but no answer, unable to leave a voicemail. Left message for [facility] admissions coordinator to confirm they take his insurance. Also want to find out if they can take him while PASARR still pending. Left voicemail for daughter.  3:22 pm: [facility] admissions coordinator has left for the day. Left another voicemail. Tried calling [facility] admissions coordinator again. Voicemail is full. No call back from daughter yet.  Expected Discharge Plan and Services                                                 Social Determinants of Health (SDOH) Interventions    Readmission Risk Interventions No flowsheet data found.

## 2020-06-08 NOTE — Progress Notes (Signed)
Physical Therapy Treatment Patient Details Name: Craig [name] MRN: [id number] DOB: Nov 07, 1940 Today's Date: 06/08/2020    History of Present Illness 79 y.o. male with medical history significant for A. fib/flutter not currently on anticoagulation, CAD, HTN, HLD, TIA, and memory loss.  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    PT Comments    Pt asleep upon arrival but answers brief questions.  Keep eyes closed. Attempted to wake pt  For session with exercises but he engages minimally and does not assist with exercises much if at all.  Mobility deferred for pt and staff safety.   Follow Up Recommendations  SNF;Supervision/Assistance - 24 hour     Equipment Recommendations  None recommended by PT    Recommendations for Other Services       Precautions / Restrictions Precautions Precautions: Fall Restrictions Weight Bearing Restrictions: No    Mobility    Balance   Cognition Arousal/Alertness: Lethargic Behavior During Therapy: Flat affect;WFL for tasks assessed/performed Overall Cognitive Status: No family/caregiver present to determine baseline cognitive functioning                                 General Comments: lethargic today and not assisting with activities desite cues and time      Exercises Other Exercises Other Exercises: BLE supine ex x 10  - little to no assist from pt    General Comments        Pertinent Vitals/Pain Pain Assessment: No/denies pain    Home Living                      Prior Function            PT Goals (current goals can now be found in the care plan section) Progress towards PT goals: Not progressing toward goals - comment    Frequency    Min 2X/week      PT Plan Current plan remains appropriate    Co-evaluation              AM-PAC PT "6  Clicks" Mobility   Outcome Measure  Help needed turning from your back to your side while in a flat bed without using bedrails?: A Little Help needed moving from lying on your back to sitting on the side of a flat bed without using bedrails?: A Little Help needed moving to and from a bed to a chair (including a wheelchair)?: A Little Help needed standing up from a chair using your arms (e.g., wheelchair or bedside chair)?: A Little Help needed to walk in hospital room?: A Little Help needed climbing 3-5 steps with a railing? : Total 6 Click Score: 16    End of Session   Activity Tolerance: Patient limited by lethargy Patient left: with bed alarm set;with call bell/phone within reach;in bed Nurse Communication: Mobility status Pain - Right/Left: Right Pain - part of body: Hip     Time: 4403-4742 PT Time Calculation (min) (ACUTE ONLY): 8 min  Charges:  $Therapeutic Exercise: 8-22 mins                    Chesley Noon, PTA 06/08/20, 1:33 PM

## 2020-06-08 NOTE — Progress Notes (Signed)
PROGRESS NOTE    Craig [name]  IHK:[id number] DOB: 21-Aug-1940 DOA: 05/26/2020 PCP: [name], MD    Assessment & Plan:   Principal Problem:   SBO (small bowel obstruction) ([place]) Active Problems:   CAD (coronary artery disease), native coronary artery   History of coronary artery stent placement   Essential hypertension   Dementia with behavioral disturbance (HCC)   Atrial flutter with rapid ventricular response (HCC)   Hypernatremia   Nicotine dependence   HFrEF (heart failure with reduced ejection fraction) ([place])   Incarcerated right inguinal hernia: w/ strangulated(gangrenous)knuckle of small bowelwith perforation, small bowel obstruction and right spermatic cord abscess. Weaned off of TPN 06/04/20. Completed course of abxs. General surg signed off and pt should f/u outpatient in 2 weeks   A. fib /flutter: w/ RVR. Likely PAF. Continue on metoprolol. Not a good candidate for anticoagulation secondary to above and high fall risk   Likely acute on chronic systolic CHF: echo shows VF64-33%, LV demonstrates global hypokinesis & diastolic parameters are indeterminate. Continue on metoprolol    Acute metabolic encephalopathy: w/ hx of dementia & behavioral disturbance. Continue w/ supportive care   Elevated troponin: likely secondary to demand ischemia   Hypokalemia: WNL   Hypophosphatemia: resolved  Dementia: continue on donepezil   DVT prophylaxis: lovenox  Code Status: full  Family Communication:  Disposition Plan: PT/OT recs SNF. Waiting on SNF placement,   Status is: Inpatient  Remains inpatient appropriate because:Ongoing diagnostic testing needed not appropriate for outpatient work up, Unsafe d/c plan, IV treatments appropriate due to intensity of illness or inability to take PO and Inpatient level of care appropriate due to severity of illness, waiting on SNF placement still, CM is working on this    Dispo: The patient is from: Home               Anticipated d/c is to: SNF              Anticipated d/c date is: whenever a SNF bed is available               Patient currently medically stable for d/c         Consultants:   Cardio  General surg    Procedures:    Antimicrobials:   Subjective: Pt c/o malaise   Objective: Vitals:   06/07/20 1610 06/07/20 1932 06/08/20 0058 06/08/20 0316  BP: 121/71 135/73 (!) 118/48 125/75  Pulse: 85 87 83 (!) 59  Resp: 16 16 16 20   Temp: (!) 97.5 F (36.4 C) 99 F (37.2 C) 97.9 F (36.6 C) 98.6 F (37 C)  TempSrc: Oral   Oral  SpO2: 99% 100% 100% 100%  Weight:        Intake/Output Summary (Last 24 hours) at 06/08/2020 0717 Last data filed at 06/07/2020 1533 Gross per 24 hour  Intake --  Output 700 ml  Net -700 ml   Filed Weights   06/04/20 0213 06/05/20 0452 06/06/20 0345  Weight: 75.9 kg 75.2 kg 73.6 kg    Examination:  General exam: Appears calm & comfortable   Respiratory system: clear breath sounds b/l. No rhonchi   Cardiovascular system: S1 & S2+. No clicks or gallops  Gastrointestinal system: Abd is soft, non-tender, non-distended & hypoactive bowel sounds  Central nervous system: Alert and awake. Moves all 4 extremities  Psychiatry: Judgement and insight appear abnormal. Flat mood and affect    Data Reviewed: I have personally reviewed following labs and imaging  studies  CBC: Recent Labs  Lab 06/01/20 0921 06/02/20 0550 06/06/20 0453 06/07/20 0550  WBC 8.7 7.8 5.2 4.6  NEUTROABS  --  6.5  --   --   HGB 11.8* 12.3* 9.9* 9.9*  HCT 35.4* 37.6* 30.6* 30.0*  MCV 94.7 96.9 98.1 96.8  PLT 157 160 219 270   Basic Metabolic Panel: Recent Labs  Lab 06/01/20 0921 06/02/20 0550 06/03/20 0622 06/04/20 0508 06/05/20 0515 06/06/20 0453 06/07/20 0550  NA 139 138 135 134* 136 133* 136  K 3.3* 3.6 3.8 4.2 3.6 3.9 3.6  CL 104 104 103 104 102 100 104  CO2 27 27 26 25 27 26 25   GLUCOSE 121* 110* 106* 99 85 97 94  BUN 8 9 11 16 15 19 20   CREATININE  0.85 0.70 0.71 0.72 0.77 0.71 0.68  CALCIUM 8.3* 8.2* 8.2* 7.8* 8.0* 8.1* 8.0*  MG 2.0 1.9 1.9 1.9  --  2.1  --   PHOS 1.7* 2.3* 2.0* 1.7* 3.1  --   --    GFR: Estimated Creatinine Clearance: 80.5 mL/min (by C-G formula based on SCr of 0.68 mg/dL). Liver Function Tests: Recent Labs  Lab 06/02/20 0550 06/03/20 0622  AST 63* 54*  ALT 42 42  ALKPHOS 54 59  BILITOT 0.8 0.6  PROT 6.2* 6.3*  ALBUMIN 2.0* 2.0*   No results for input(s): LIPASE, AMYLASE in the last 168 hours. No results for input(s): AMMONIA in the last 168 hours. Coagulation Profile: No results for input(s): INR, PROTIME in the last 168 hours. Cardiac Enzymes: No results for input(s): CKTOTAL, CKMB, CKMBINDEX, TROPONINI in the last 168 hours. BNP (last 3 results) No results for input(s): PROBNP in the last 8760 hours. HbA1C: No results for input(s): HGBA1C in the last 72 hours. CBG: Recent Labs  Lab 06/04/20 1133 06/04/20 1747 06/04/20 2036 06/04/20 2339 06/05/20 0457  GLUCAP 123* 99 124* 90 83   Lipid Profile: No results for input(s): CHOL, HDL, LDLCALC, TRIG, CHOLHDL, LDLDIRECT in the last 72 hours. Thyroid Function Tests: No results for input(s): TSH, T4TOTAL, FREET4, T3FREE, THYROIDAB in the last 72 hours. Anemia Panel: No results for input(s): VITAMINB12, FOLATE, FERRITIN, TIBC, IRON, RETICCTPCT in the last 72 hours. Sepsis Labs: No results for input(s): PROCALCITON, LATICACIDVEN in the last 168 hours.  No results found for this or any previous visit (from the past 240 hour(s)).       Radiology Studies: No results found.      Scheduled Meds: . aspirin  81 mg Oral Daily  . bisacodyl  10 mg Rectal q morning - 10a  . Chlorhexidine Gluconate Cloth  6 each Topical Daily  . donepezil  10 mg Oral QHS  . enoxaparin (LOVENOX) injection  40 mg Subcutaneous Q24H  . feeding supplement  237 mL Oral TID BM  . losartan  25 mg Oral Daily  . mouth rinse  15 mL Mouth Rinse BID  . metoprolol succinate   25 mg Oral Daily  . multivitamin with minerals  1 tablet Oral Daily  . pantoprazole  40 mg Oral Daily  . sodium chloride flush  10-40 mL Intracatheter Q12H   Continuous Infusions: . sodium chloride Stopped (06/01/20 0630)     LOS: 12 days    Time spent: 22 mins    Wyvonnia Dusky, MD [facility] Pager [phone]  If 7PM-7AM, please contact night-coverage 06/08/2020, 7:17 AM

## 2020-06-09 LAB — BASIC METABOLIC PANEL
Anion gap: 6 (ref 5–15)
BUN: 19 mg/dL (ref 8–23)
CO2: 28 mmol/L (ref 22–32)
Calcium: 8.1 mg/dL — ABNORMAL LOW (ref 8.9–10.3)
Chloride: 106 mmol/L (ref 98–111)
Creatinine, Ser: 0.82 mg/dL (ref 0.61–1.24)
GFR, Estimated: >60 mL/min (ref >60)
Glucose, Bld: 86 mg/dL (ref 70–99)
Potassium: 4.2 mmol/L (ref 3.5–5.1)
Sodium: 140 mmol/L (ref 135–145)

## 2020-06-09 LAB — CBC
HCT: 30.8 % — ABNORMAL LOW (ref 39.0–52.0)
Hemoglobin: 9.6 g/dL — ABNORMAL LOW (ref 13.0–17.0)
MCH: 31.3 pg (ref 26.0–34.0)
MCHC: 31.2 g/dL (ref 30.0–36.0)
MCV: 100.3 fL — ABNORMAL HIGH (ref 80.0–100.0)
Platelets: 256 10*3/uL (ref 150–400)
RBC: 3.07 MIL/uL — ABNORMAL LOW (ref 4.22–5.81)
RDW: 13 % (ref 11.5–15.5)
WBC: 4 10*3/uL (ref 4.0–10.5)
nRBC: 0 % (ref 0.0–0.2)

## 2020-06-09 NOTE — Progress Notes (Signed)
PROGRESS NOTE    Craig [name]  NFA:[id number] DOB: [birth year] DOA: 05/26/2020 PCP: [name], MD   Brief Narrative: Taken from prior notes. Craig [name] is a 79 y.o. male with medical history significant for CAD with history of CABG in the 1990s, a flutter, HTN, history of TIA, who previously lived alone and now lives with daughter, recently hospitalized in November 2021 with altered mental status and agitation related to dementia and hyponatremia with stay complicated by a flutter with slow ventricular response, discharged on 11/8, returning to the ED on 11/30 with a concern for ingestion, who now returns to the emergency room with a several day history of not acting himself and with a complaint of abdominal pain.  Found to have incarcerated hernia with gangrene s/p repair. Patient did require TPN, not tolerating diet well. PT is recommending SNF placement, had 3 bed offer, TOC is trying to reach family for final decision.  Subjective: Patient has no new complaints when seen today.  He was eating his breakfast. Having bowel movement.  Denies any nausea or vomiting.  Assessment & Plan:   Principal Problem:   SBO (small bowel obstruction) (HCC) Active Problems:   CAD (coronary artery disease), native coronary artery   History of coronary artery stent placement   Essential hypertension   Dementia with behavioral disturbance (HCC)   Atrial flutter with rapid ventricular response (HCC)   Hypernatremia   Nicotine dependence   HFrEF (heart failure with reduced ejection fraction) ([place])  Incarcerated right inguinal hernia: w/ strangulated(gangrenous)knuckle of small bowelwith perforation, small bowel obstruction and right spermatic cord abscess.  S/p repair. Taken off from TPN on 06/04/2020.  Completed the course of antibiotics.  General surgery signed off.  Tolerating diet well and having bowel movement. -Recommending SNF placement-had bed offers but difficult to reach  family for final decisions.  A flutter with RVR.  Currently rate well controlled.  Not a candidate for anticoagulation due to high fall risk. -Continue with metoprolol.  HFrEF.  Echocardiogram with EF of 25 to 30%, hypokinesia and diastolic parameters were indeterminate.  Appears euvolemic. -Continue with metoprolol.  Metabolic encephalopathy.  Resolved.  Hypokalemia/hypophosphatemia.  Resolved  Dementia with behavioral disturbances.  Appears to be at his baseline. -Continue with donepezil. -Continue with supportive care  Objective: Vitals:   06/08/20 2033 06/09/20 0451 06/09/20 0959 06/09/20 1227  BP: 128/73 119/68 127/74 121/71  Pulse: 86 80 62 61  Resp: 16 16 18 17   Temp: 98.3 F (36.8 C) 98.5 F (36.9 C) 97.6 F (36.4 C) 97.9 F (36.6 C)  TempSrc: Oral Oral Oral Oral  SpO2: 99% 100% 99% 100%  Weight:        Intake/Output Summary (Last 24 hours) at 06/09/2020 1546 Last data filed at 06/09/2020 1400 Gross per 24 hour  Intake 360 ml  Output --  Net 360 ml   Filed Weights   06/04/20 0213 06/05/20 0452 06/06/20 0345  Weight: 75.9 kg 75.2 kg 73.6 kg    Examination:  General exam: Appears calm and comfortable  Respiratory system: Clear to auscultation. Respiratory effort normal. Cardiovascular system: S1 & S2 heard, RRR.  Gastrointestinal system: Soft, nontender, nondistended, bowel sounds positive. Central nervous system: Alert and oriented. No focal neurological deficits. Extremities: No edema, no cyanosis, pulses intact and symmetrical. Psychiatry: Judgement and insight appear impaired.   DVT prophylaxis: Lovenox Code Status: Full Family Communication: Call daughter with no response, TOC is also trying to reach family. Disposition Plan:  Status is:  Inpatient  Remains inpatient appropriate because:Inpatient level of care appropriate due to severity of illness   Dispo: The patient is from: Home              Anticipated d/c is to: SNF               Anticipated d/c date is: 1 day              Patient currently is medically stable to d/c.  Had bed offers but TOC is unsuccessful to reach family member for final decision.    Consultants:   General surgery-signed off.  Procedures:  Antimicrobials:   Data Reviewed: I have personally reviewed following labs and imaging studies  CBC: Recent Labs  Lab 06/06/20 0453 06/07/20 0550 06/08/20 1008 06/09/20 0420  WBC 5.2 4.6 4.6 4.0  HGB 9.9* 9.9* 10.3* 9.6*  HCT 30.6* 30.0* 31.6* 30.8*  MCV 98.1 96.8 97.2 100.3*  PLT 219 242 260 [id number]   Basic Metabolic Panel: Recent Labs  Lab 06/03/20 0622 06/04/20 0508 06/05/20 0515 06/06/20 0453 06/07/20 0550 06/08/20 1008 06/09/20 0420  NA 135 134* 136 133* 136 138 140  K 3.8 4.2 3.6 3.9 3.6 3.5 4.2  CL 103 104 102 100 104 105 106  CO2 26 25 27 26 25 27 28   GLUCOSE 106* 99 85 97 94 131* 86  BUN 11 16 15 19 20 18 19   CREATININE 0.71 0.72 0.77 0.71 0.68 0.81 0.82  CALCIUM 8.2* 7.8* 8.0* 8.1* 8.0* 8.1* 8.1*  MG 1.9 1.9  --  2.1  --   --   --   PHOS 2.0* 1.7* 3.1  --   --   --   --    GFR: Estimated Creatinine Clearance: 78.5 mL/min (by C-G formula based on SCr of 0.82 mg/dL). Liver Function Tests: Recent Labs  Lab 06/03/20 0622  AST 54*  ALT 42  ALKPHOS 59  BILITOT 0.6  PROT 6.3*  ALBUMIN 2.0*   No results for input(s): LIPASE, AMYLASE in the last 168 hours. No results for input(s): AMMONIA in the last 168 hours. Coagulation Profile: No results for input(s): INR, PROTIME in the last 168 hours. Cardiac Enzymes: No results for input(s): CKTOTAL, CKMB, CKMBINDEX, TROPONINI in the last 168 hours. BNP (last 3 results) No results for input(s): PROBNP in the last 8760 hours. HbA1C: No results for input(s): HGBA1C in the last 72 hours. CBG: Recent Labs  Lab 06/04/20 1747 06/04/20 2036 06/04/20 2339 06/05/20 0457 06/08/20 0751  GLUCAP 99 124* 90 83 77   Lipid Profile: No results for input(s): CHOL, HDL, LDLCALC, TRIG,  CHOLHDL, LDLDIRECT in the last 72 hours. Thyroid Function Tests: No results for input(s): TSH, T4TOTAL, FREET4, T3FREE, THYROIDAB in the last 72 hours. Anemia Panel: No results for input(s): VITAMINB12, FOLATE, FERRITIN, TIBC, IRON, RETICCTPCT in the last 72 hours. Sepsis Labs: No results for input(s): PROCALCITON, LATICACIDVEN in the last 168 hours.  No results found for this or any previous visit (from the past 240 hour(s)).   Radiology Studies: No results found.  Scheduled Meds: . aspirin  81 mg Oral Daily  . Chlorhexidine Gluconate Cloth  6 each Topical Daily  . donepezil  10 mg Oral QHS  . enoxaparin (LOVENOX) injection  40 mg Subcutaneous Q24H  . feeding supplement  237 mL Oral TID BM  . losartan  25 mg Oral Daily  . mouth rinse  15 mL Mouth Rinse BID  . metoprolol succinate  25 mg Oral  Daily  . multivitamin with minerals  1 tablet Oral Daily  . pantoprazole  40 mg Oral Daily  . sodium chloride flush  10-40 mL Intracatheter Q12H   Continuous Infusions: . sodium chloride Stopped (06/01/20 0630)     LOS: 13 days   Time spent: 30 minutes.  Lorella Nimrod, MD [facility]  If 7PM-7AM, please contact night-coverage [email or web address]  06/09/2020, 3:46 PM   This record has been created using Systems analyst. Errors have been sought and corrected,but may not always be located. Such creation errors do not reflect on the standard of care.

## 2020-06-09 NOTE — TOC Progression Note (Addendum)
Transition of Care [facility]) - Progression Note    Patient Details  Name: Craig [name] MRN: [id number] Date of Birth: April 29, 1941  Transition of Care [facility]) CM/SW [place], LCSW Phone Number: 06/09/2020, 8:27 AM  Clinical Narrative:   PASARR obtained: [phone] H. No call back from SNF admissions coordinators or daughter yet. Will follow up later this morning.  9:02 am: Spoke with [facility] SN[facility] admissions coordinator. She sent a message to their regional administrator to confirm they take patient's insurance. She will update CSW when she has an answer. Tried [facility] admissions coordinator again. Did not leave a third voicemail.  12:40 pm: [place] is in network with [employer]. Left daughter another voicemail. Also tried calling son but voicemail is full.  2:27 pm: Unsuccessfully called patient's children again.  4:39 pm: Received call back from daughter. Reviewed bed offers. She is agreeable to [facility]. Admissions coordinator has started insurance authorization.  Expected Discharge Plan and Services                                                 Social Determinants of Health (SDOH) Interventions    Readmission Risk Interventions No flowsheet data found.

## 2020-06-09 NOTE — Progress Notes (Signed)
Nutrition Follow Up Note   DOCUMENTATION CODES:   Severe malnutrition in context of social or environmental circumstances  INTERVENTION:   Ensure Enlive po TID, each supplement provides 350 kcal and 20 grams of protein  Magic cup TID with meals, each supplement provides 290 kcal and 9 grams of protein  MVI daily   NUTRITION DIAGNOSIS:   Severe Malnutrition related to social / environmental circumstances (dementia, suspected inadequate oral intake) as evidenced by severe fat depletion,severe muscle depletion.  GOAL:   Patient will meet greater than or equal to 90% of their needs  -progressing with oral intake   MONITOR:   PO intake,Supplement acceptance,Labs,Skin,I & O's,Weight trends  ASSESSMENT:   79 year old male with PMHx of dementia, HTN, atrial flutter, CAD, HLD, TIA admitted with right inguinal hernia with strangulated knuckle of small bowel, small bowel obstruction, and right spermatic cord abscess s/p right inguinal hernia repair, small bowel resection, and right orchiectomy on 12/9.   Pt with fair appetite and oral intake in hospital; pt eating anywhere from sips and bites to 100% of meals. Pt ate 80% of his dinner last night. Pt is drinking most of his Ensure supplements. Per chart, pt is weight stable since admit. Plan is for SNF at discharge.   Medications reviewed and include: aspirin, dulcolax, lovenox, MVI, protonix  Labs reviewed: K 4.2 wnl Hgb 9.6(L), Hct 30.8(L)  Diet Order:   Diet Order            DIET SOFT Room service appropriate? Yes; Fluid consistency: Thin  Diet effective now                EDUCATION NEEDS:   No education needs have been identified at this time  Skin:  Skin Assessment: Skin Integrity Issues: Skin Integrity Issues:: Incisions Incisions: closed incisions to abdomen and scrotum  Last BM:  12/22- type 4  Height:   Ht Readings from Last 1 Encounters:  05/18/20 6\' 2"  (1.88 m)   Weight:   Wt Readings from Last 1  Encounters:  06/06/20 73.6 kg   Ideal Body Weight:  86.4 kg  BMI:  Body mass index is 20.84 kg/m.  Estimated Nutritional Needs:   Kcal:  2000-2200  Protein:  100-110 grams  Fluid:  1.8 L/day  Koleen Distance MS, RD, LDN Please refer to [facility] for RD and/or RD on-call/weekend/after hours pager

## 2020-06-09 NOTE — Progress Notes (Signed)
PT Cancellation Note  Patient Details Name: Craig [name] MRN: [id number] DOB: 25-Mar-1941   Cancelled Treatment:    Reason Eval/Treat Not Completed: Patient declined, no reason specified (Pt awake upon entry, left sidelying in bed, minimally intractive. Author offers 10+ minutes of encouraging engagement, attempts to motivate pt to do any type of moveme. Pt deflects all offers, unable to be motivated to participate this date. Pt educated on health risks associated with prolonged bed rest and inactivity, including but not limited to loss of independence and increased length of stay. Pt not responsive to educational interventions. Will attempt again at later date/time.    Kanae Ignatowski C 06/09/2020, 2:35 PM

## 2020-06-10 NOTE — TOC Progression Note (Signed)
Transition of Care [facility]) - Progression Note    Patient Details  Name: Craig [name] MRN: [id number] Date of Birth: [birth year]  Transition of Care [facility]) CM/SW Contact  Beverly Sessions, RN Phone Number: 06/10/2020, 1:57 PM  Clinical Narrative:      Provided my contact information to [facility] Admissions coordinator at [facility] for updates on insurance approval       Expected Discharge Plan and Services                                                 Social Determinants of Health (SDOH) Interventions    Readmission Risk Interventions No flowsheet data found.

## 2020-06-10 NOTE — Progress Notes (Signed)
PROGRESS NOTE    Craig [name]  LZJ:[id number] DOB: [birth year] DOA: 05/26/2020 PCP: Leanna Sato, MD   Brief Narrative: Taken from prior notes. Craig [name] is a 79 y.o. male with medical history significant for CAD with history of CABG in the 1990s, a flutter, HTN, history of TIA, who previously lived alone and now lives with daughter, recently hospitalized in November 2021 with altered mental status and agitation related to dementia and hyponatremia with stay complicated by a flutter with slow ventricular response, discharged on 11/8, returning to the ED on 11/30 with a concern for ingestion, who now returns to the emergency room with a several day history of not acting himself and with a complaint of abdominal pain.  Found to have incarcerated hernia with gangrene s/p repair. Patient did require TPN, not tolerating diet well. PT is recommending SNF placement, had 3 bed offer, TOC is trying to reach family for final decision.  Subjective: Patient was lying comfortably when seen today.  No new complaint.  Eating and drinking okay.  Assessment & Plan:   Principal Problem:   SBO (small bowel obstruction) (HCC) Active Problems:   CAD (coronary artery disease), native coronary artery   History of coronary artery stent placement   Essential hypertension   Dementia with behavioral disturbance (HCC)   Atrial flutter with rapid ventricular response (HCC)   Hypernatremia   Nicotine dependence   HFrEF (heart failure with reduced ejection fraction) (HCC)  Incarcerated right inguinal hernia: w/ strangulated(gangrenous)knuckle of small bowelwith perforation, small bowel obstruction and right spermatic cord abscess.  S/p repair. Taken off from TPN on 06/04/2020.  Completed the course of antibiotics.  General surgery signed off.  Tolerating diet well and having bowel movement. -Recommending SNF placement-had bed offers -pending insurance authorization.  A flutter with RVR.   Currently rate well controlled.  Not a candidate for anticoagulation due to high fall risk. -Continue with metoprolol.  HFrEF.  Echocardiogram with EF of 25 to 30%, hypokinesia and diastolic parameters were indeterminate.  Appears euvolemic. -Continue with metoprolol.  Metabolic encephalopathy.  Resolved.  Hypokalemia/hypophosphatemia.  Resolved  Dementia with behavioral disturbances.  Appears to be at his baseline. -Continue with donepezil. -Continue with supportive care  Objective: Vitals:   06/09/20 2110 06/10/20 0614 06/10/20 0830 06/10/20 1144  BP: (!) 153/79 (!) 142/84 127/85 124/69  Pulse: 78 62 64 64  Resp: 16 16 18 20   Temp: 98.9 F (37.2 C) 97.8 F (36.6 C) 97.6 F (36.4 C) 98.7 F (37.1 C)  TempSrc: Oral Oral Oral Oral  SpO2: 100% 100% 100% 100%  Weight:        Intake/Output Summary (Last 24 hours) at 06/10/2020 1525 Last data filed at 06/10/2020 1300 Gross per 24 hour  Intake 480 ml  Output 500 ml  Net -20 ml   Filed Weights   06/04/20 0213 06/05/20 0452 06/06/20 0345  Weight: 75.9 kg 75.2 kg 73.6 kg    Examination:  General.  Well-developed gentleman, in no acute distress. Pulmonary.  Lungs clear bilaterally, normal respiratory effort. CV.  Regular rate and rhythm, no JVD, rub or murmur. Abdomen.  Soft, nontender, nondistended, BS positive. CNS.  Alert and oriented x3.  No focal neurologic deficit. Extremities.  No edema, no cyanosis, pulses intact and symmetrical. Psychiatry.  Judgment and insight appears normal.  DVT prophylaxis: Lovenox Code Status: Full Family Communication: Discussed with patient Disposition Plan:  Status is: Inpatient  Remains inpatient appropriate because:Inpatient level of care appropriate due to severity  of illness   Dispo: The patient is from: Home              Anticipated d/c is to: SNF              Anticipated d/c date is: 1 day              Patient currently is medically stable to d/c.  Had bed offers-pending  insurance authorization.   Consultants:   General surgery-signed off.  Procedures:  Antimicrobials:   Data Reviewed: I have personally reviewed following labs and imaging studies  CBC: Recent Labs  Lab 06/06/20 0453 06/07/20 0550 06/08/20 1008 06/09/20 0420  WBC 5.2 4.6 4.6 4.0  HGB 9.9* 9.9* 10.3* 9.6*  HCT 30.6* 30.0* 31.6* 30.8*  MCV 98.1 96.8 97.2 100.3*  PLT 219 242 260 662   Basic Metabolic Panel: Recent Labs  Lab 06/04/20 0508 06/05/20 0515 06/06/20 0453 06/07/20 0550 06/08/20 1008 06/09/20 0420  NA 134* 136 133* 136 138 140  K 4.2 3.6 3.9 3.6 3.5 4.2  CL 104 102 100 104 105 106  CO2 25 27 26 25 27 28   GLUCOSE 99 85 97 94 131* 86  BUN 16 15 19 20 18 19   CREATININE 0.72 0.77 0.71 0.68 0.81 0.82  CALCIUM 7.8* 8.0* 8.1* 8.0* 8.1* 8.1*  MG 1.9  --  2.1  --   --   --   PHOS 1.7* 3.1  --   --   --   --    GFR: Estimated Creatinine Clearance: 78.5 mL/min (by C-G formula based on SCr of 0.82 mg/dL). Liver Function Tests: No results for input(s): AST, ALT, ALKPHOS, BILITOT, PROT, ALBUMIN in the last 168 hours. No results for input(s): LIPASE, AMYLASE in the last 168 hours. No results for input(s): AMMONIA in the last 168 hours. Coagulation Profile: No results for input(s): INR, PROTIME in the last 168 hours. Cardiac Enzymes: No results for input(s): CKTOTAL, CKMB, CKMBINDEX, TROPONINI in the last 168 hours. BNP (last 3 results) No results for input(s): PROBNP in the last 8760 hours. HbA1C: No results for input(s): HGBA1C in the last 72 hours. CBG: Recent Labs  Lab 06/04/20 1747 06/04/20 2036 06/04/20 2339 06/05/20 0457 06/08/20 0751  GLUCAP 99 124* 90 83 77   Lipid Profile: No results for input(s): CHOL, HDL, LDLCALC, TRIG, CHOLHDL, LDLDIRECT in the last 72 hours. Thyroid Function Tests: No results for input(s): TSH, T4TOTAL, FREET4, T3FREE, THYROIDAB in the last 72 hours. Anemia Panel: No results for input(s): VITAMINB12, FOLATE, FERRITIN, TIBC,  IRON, RETICCTPCT in the last 72 hours. Sepsis Labs: No results for input(s): PROCALCITON, LATICACIDVEN in the last 168 hours.  No results found for this or any previous visit (from the past 240 hour(s)).   Radiology Studies: No results found.  Scheduled Meds: . aspirin  81 mg Oral Daily  . Chlorhexidine Gluconate Cloth  6 each Topical Daily  . donepezil  10 mg Oral QHS  . enoxaparin (LOVENOX) injection  40 mg Subcutaneous Q24H  . feeding supplement  237 mL Oral TID BM  . losartan  25 mg Oral Daily  . mouth rinse  15 mL Mouth Rinse BID  . metoprolol succinate  25 mg Oral Daily  . multivitamin with minerals  1 tablet Oral Daily  . pantoprazole  40 mg Oral Daily  . sodium chloride flush  10-40 mL Intracatheter Q12H   Continuous Infusions: . sodium chloride Stopped (06/01/20 0630)     LOS: 14 days  Time spent: 25 minutes.  Lorella Nimrod, MD [facility]  If 7PM-7AM, please contact night-coverage [email or web address]  06/10/2020, 3:25 PM   This record has been created using Systems analyst. Errors have been sought and corrected,but may not always be located. Such creation errors do not reflect on the standard of care.

## 2020-06-10 NOTE — Progress Notes (Signed)
Occupational Therapy Treatment Patient Details Name: Craig [name] MRN: [id number] DOB: 26-Nov-1940 Today's Date: 06/10/2020    History of present illness 79 y.o. male with medical history significant for A. fib/flutter not currently on anticoagulation, CAD, HTN, HLD, TIA, and memory loss.  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   OT comments  Pt seen for OT treatment on this date. Upon arrival to room, pt awake, seated upright in bed, oriented to self only. Pt does not respond to questions asking about pain, but is holding his stomach and groaning. Pt somewhat impulsive, requiring close SUPV for safety, but largely able to complete grooming, dressing, and toileting tasks without physical assist. Pt making good progress toward goals and continues to benefit from OT services during this hospitalization. Will continue to follow POC. Discharge recommendation remains appropriate.    Follow Up Recommendations  SNF    Equipment Recommendations       Recommendations for Other Services      Precautions / Restrictions Precautions Precautions: Fall Restrictions Weight Bearing Restrictions: No       Mobility Bed Mobility Overal bed mobility: Needs Assistance Bed Mobility: Supine to Sit;Sit to Supine     Supine to sit: Min guard Sit to supine: Min guard      Transfers Overall transfer level: Needs assistance Equipment used: Rolling walker (2 wheeled) Transfers: Sit to/from [facility] Sit to Stand: Min guard Stand pivot transfers: Min guard            Balance Overall balance assessment: Needs assistance Sitting-balance support: Feet supported Sitting balance-Leahy Scale: Good     Standing balance support: Bilateral upper extremity supported Standing balance-Leahy Scale: Fair Standing balance comment: requires  close supervision for safety, 2/2 pt impulsivity                           ADL either performed or assessed with clinical judgement   ADL Overall ADL's : Needs assistance/impaired Eating/Feeding: Set up;Sitting   Grooming: Set up;Sitting;Cueing for sequencing;Supervision/safety Grooming Details (indicate cue type and reason): Pt washed face, combed hair, applied lotion, while seated, with cueing to continue with task             Lower Body Dressing: Sitting/lateral leans;Supervision/safety Lower Body Dressing Details (indicate cue type and reason): Pt able to doff/don socks while seated with no assist from therapist Toilet Transfer: Min guard;Stand-pivot;Cueing for safety;Cueing for sequencing;BSC   Toileting- Clothing Manipulation and Hygiene: Min guard               Vision Patient Visual Report: No change from baseline     Perception     Praxis      Cognition Arousal/Alertness: Awake/alert Behavior During Therapy: WFL for tasks assessed/performed;Impulsive Overall Cognitive Status: No family/caregiver present to determine baseline cognitive functioning                                 General Comments: Pt not oriented to either place or time; unable to provide any personal history; slightly impulsive, but manageable, and calmed considerably during session        Exercises Other Exercises Other Exercises: provided educ re: fall and safety precautions, self-care. Cognitive re-orientation. Bed mobility, transfers, LB dressing, grooming, toileting, self-feeding   Shoulder Instructions  General Comments      Pertinent Vitals/ Pain       Pain Assessment: 0-10 Faces Pain Scale: Hurts little more Pain Location: abdomen Pain Intervention(s): Utilized relaxation techniques;Monitored during session;Repositioned  Home Living                                          Prior Functioning/Environment               Frequency  Min 1X/week        Progress Toward Goals  OT Goals(current goals can now be found in the care plan section)  Progress towards OT goals: Progressing toward goals  Acute Rehab OT Goals Patient Stated Goal: pt unable to state a goal OT Goal Formulation: With patient Time For Goal Achievement: 06/21/20 Potential to Achieve Goals: Good  Plan Discharge plan remains appropriate;Frequency remains appropriate    Co-evaluation                 AM-PAC OT "6 Clicks" Daily Activity     Outcome Measure   Help from another person eating meals?: A Little Help from another person taking care of personal grooming?: A Little Help from another person toileting, which includes using toliet, bedpan, or urinal?: A Lot Help from another person bathing (including washing, rinsing, drying)?: A Lot Help from another person to put on and taking off regular upper body clothing?: A Little Help from another person to put on and taking off regular lower body clothing?: A Little 6 Click Score: 16    End of Session Equipment Utilized During Treatment: Rolling walker;Gait belt  OT Visit Diagnosis: Other abnormalities of gait and mobility (R26.89);Muscle weakness (generalized) (M62.81);Other symptoms and signs involving cognitive function   Activity Tolerance Patient tolerated treatment well   Patient Left in bed;with call bell/phone within reach;with bed alarm set   Nurse Communication          Time: 1779-3903 OT Time Calculation (min): 39 min  Charges: OT General Charges $OT Visit: 1 Visit OT Treatments $Self Care/Home Management : 23-37 mins $Therapeutic Activity: 8-22 mins  Latina Craver, PhD, MS, OTR/L ascom [phone] 06/10/20, 4:11 PM

## 2020-06-11 LAB — URINALYSIS, COMPLETE (UACMP) WITH MICROSCOPIC
Bacteria, UA: NONE SEEN
Bilirubin Urine: NEGATIVE
Glucose, UA: NEGATIVE mg/dL
Hgb urine dipstick: NEGATIVE
Ketones, ur: NEGATIVE mg/dL
Leukocytes,Ua: NEGATIVE
Nitrite: NEGATIVE
Protein, ur: NEGATIVE mg/dL
Specific Gravity, Urine: 1.012 (ref 1.005–1.030)
pH: 7 (ref 5.0–8.0)

## 2020-06-11 NOTE — Care Management Important Message (Signed)
Important Message  Patient Details  Name: Craig [name] MRN: [id number] Date of Birth: 1940-09-12   Medicare Important Message Given:  Yes     [name] [name] 06/11/2020, 11:38 AM

## 2020-06-11 NOTE — Progress Notes (Signed)
PROGRESS NOTE    Craig [name]  [id number] DOB: [birth year] DOA: 05/26/2020 PCP: [name], MD   Brief Narrative: Taken from prior notes. Craig [name] is a 79 y.o. male with medical history significant for CAD with history of CABG in the 1990s, a flutter, HTN, history of TIA, who previously lived alone and now lives with daughter, recently hospitalized in November 2021 with altered mental status and agitation related to dementia and hyponatremia with stay complicated by a flutter with slow ventricular response, discharged on 11/8, returning to the ED on 11/30 with a concern for ingestion, who now returns to the emergency room with a several day history of not acting himself and with a complaint of abdominal pain.  Found to have incarcerated hernia with gangrene s/p repair. Patient did require TPN, not tolerating diet well. PT is recommending SNF placement, had 3 bed offer, TOC is trying to reach family for final decision.  Subjective: Patient has no new complaints when seen today.  There was some concern of dysuria by nursing staff but patient denies.  Assessment & Plan:   Principal Problem:   SBO (small bowel obstruction) (HCC) Active Problems:   CAD (coronary artery disease), native coronary artery   History of coronary artery stent placement   Essential hypertension   Dementia with behavioral disturbance (HCC)   Atrial flutter with rapid ventricular response (HCC)   Hypernatremia   Nicotine dependence   HFrEF (heart failure with reduced ejection fraction) ([place])  Incarcerated right inguinal hernia: w/ strangulated(gangrenous)knuckle of small bowelwith perforation, small bowel obstruction and right spermatic cord abscess.  S/p repair. Taken off from TPN on 06/04/2020.  Completed the course of antibiotics.  General surgery signed off.  Tolerating diet well and having bowel movement. -Recommending SNF placement-had bed offers -pending insurance  authorization.  Dysuria.  There was some concern of dysuria by nursing staff.  Patient denies any complaint.  UA sent and it does not look infected.  A flutter with RVR.  Currently rate well controlled.  Not a candidate for anticoagulation due to high fall risk. -Continue with metoprolol.  HFrEF.  Echocardiogram with EF of 25 to 30%, hypokinesia and diastolic parameters were indeterminate.  Appears euvolemic. -Continue with metoprolol.  Metabolic encephalopathy.  Resolved.  Hypokalemia/hypophosphatemia.  Resolved  Dementia with behavioral disturbances.  Appears to be at his baseline. -Continue with donepezil. -Continue with supportive care  Objective: Vitals:   06/10/20 1934 06/11/20 0531 06/11/20 0900 06/11/20 1150  BP: 123/70 120/70 124/79 125/72  Pulse: 77 62 62 63  Resp: 20 20  18   Temp: 98.8 F (37.1 C) 98.7 F (37.1 C) 98.1 F (36.7 C) (!) 97.5 F (36.4 C)  TempSrc: Oral Axillary Oral   SpO2: 100% 100% 100% 100%  Weight:        Intake/Output Summary (Last 24 hours) at 06/11/2020 1430 Last data filed at 06/11/2020 1300 Gross per 24 hour  Intake 500 ml  Output 550 ml  Net -50 ml   Filed Weights   06/04/20 0213 06/05/20 0452 06/06/20 0345  Weight: 75.9 kg 75.2 kg 73.6 kg    Examination:  General.  Elderly gentleman, in no acute distress. Pulmonary.  Lungs clear bilaterally, normal respiratory effort. CV.  Regular rate and rhythm, no JVD, rub or murmur. Abdomen.  Soft, nontender, nondistended, BS positive. CNS.  Alert and oriented x3.  No focal neurologic deficit. Extremities.  No edema, no cyanosis, pulses intact and symmetrical. Psychiatry.  Judgment and insight appears normal.  DVT prophylaxis: Lovenox Code Status: Full Family Communication: Discussed with patient Disposition Plan:  Status is: Inpatient  Remains inpatient appropriate because:Inpatient level of care appropriate due to severity of illness   Dispo: The patient is from: Home               Anticipated d/c is to: SNF              Anticipated d/c date is: 1 day              Patient currently is medically stable to d/c.  Had bed offers-pending insurance authorization.   Consultants:   General surgery-signed off.  Procedures:  Antimicrobials:   Data Reviewed: I have personally reviewed following labs and imaging studies  CBC: Recent Labs  Lab 06/06/20 0453 06/07/20 0550 06/08/20 1008 06/09/20 0420  WBC 5.2 4.6 4.6 4.0  HGB 9.9* 9.9* 10.3* 9.6*  HCT 30.6* 30.0* 31.6* 30.8*  MCV 98.1 96.8 97.2 100.3*  PLT 219 242 260 [id number]   Basic Metabolic Panel: Recent Labs  Lab 06/05/20 0515 06/06/20 0453 06/07/20 0550 06/08/20 1008 06/09/20 0420  NA 136 133* 136 138 140  K 3.6 3.9 3.6 3.5 4.2  CL 102 100 104 105 106  CO2 27 26 25 27 28   GLUCOSE 85 97 94 131* 86  BUN 15 19 20 18 19   CREATININE 0.77 0.71 0.68 0.81 0.82  CALCIUM 8.0* 8.1* 8.0* 8.1* 8.1*  MG  --  2.1  --   --   --   PHOS 3.1  --   --   --   --    GFR: Estimated Creatinine Clearance: 78.5 mL/min (by C-G formula based on SCr of 0.82 mg/dL). Liver Function Tests: No results for input(s): AST, ALT, ALKPHOS, BILITOT, PROT, ALBUMIN in the last 168 hours. No results for input(s): LIPASE, AMYLASE in the last 168 hours. No results for input(s): AMMONIA in the last 168 hours. Coagulation Profile: No results for input(s): INR, PROTIME in the last 168 hours. Cardiac Enzymes: No results for input(s): CKTOTAL, CKMB, CKMBINDEX, TROPONINI in the last 168 hours. BNP (last 3 results) No results for input(s): PROBNP in the last 8760 hours. HbA1C: No results for input(s): HGBA1C in the last 72 hours. CBG: Recent Labs  Lab 06/04/20 1747 06/04/20 2036 06/04/20 2339 06/05/20 0457 06/08/20 0751  GLUCAP 99 124* 90 83 77   Lipid Profile: No results for input(s): CHOL, HDL, LDLCALC, TRIG, CHOLHDL, LDLDIRECT in the last 72 hours. Thyroid Function Tests: No results for input(s): TSH, T4TOTAL, FREET4, T3FREE,  THYROIDAB in the last 72 hours. Anemia Panel: No results for input(s): VITAMINB12, FOLATE, FERRITIN, TIBC, IRON, RETICCTPCT in the last 72 hours. Sepsis Labs: No results for input(s): PROCALCITON, LATICACIDVEN in the last 168 hours.  No results found for this or any previous visit (from the past 240 hour(s)).   Radiology Studies: No results found.  Scheduled Meds: . aspirin  81 mg Oral Daily  . Chlorhexidine Gluconate Cloth  6 each Topical Daily  . donepezil  10 mg Oral QHS  . enoxaparin (LOVENOX) injection  40 mg Subcutaneous Q24H  . feeding supplement  237 mL Oral TID BM  . losartan  25 mg Oral Daily  . mouth rinse  15 mL Mouth Rinse BID  . metoprolol succinate  25 mg Oral Daily  . multivitamin with minerals  1 tablet Oral Daily  . pantoprazole  40 mg Oral Daily  . sodium chloride flush  10-40 mL Intracatheter Q12H  Continuous Infusions: . sodium chloride Stopped (06/01/20 0630)     LOS: 15 days   Time spent: 25 minutes.  Lorella Nimrod, MD [facility]  If 7PM-7AM, please contact night-coverage [email or web address]  06/11/2020, 2:30 PM   This record has been created using Systems analyst. Errors have been sought and corrected,but may not always be located. Such creation errors do not reflect on the standard of care.

## 2020-06-11 NOTE — TOC Progression Note (Signed)
Transition of Care [facility]) - Progression Note    Patient Details  Name: Craig [name] MRN: [id number] Date of Birth: 07-26-1940  [facility] CM/SW Contact  Beverly Sessions, RN Phone Number: 06/11/2020, 10:26 AM  Clinical Narrative:    Per [place] at [place] still pending         Expected Discharge Plan and Services                                                 Social Determinants of Health (SDOH) Interventions    Readmission Risk Interventions No flowsheet data found.

## 2020-06-11 NOTE — Progress Notes (Signed)
PT Cancellation Note  Patient Details Name: Craig [name] MRN: [id number] DOB: [birth year]   Cancelled Treatment:     Therapist in this am, pt only wanting help to raise legs into bed to rest, therapist returned in pm, pt resting, pt refused any activity.    Josie Dixon 06/11/2020, 3:58 PM

## 2020-06-11 NOTE — Progress Notes (Signed)
CCMD called at 425 notifying that patient had a 4 beat run of VTach. Patient stable.  Patient noted to have drops of hematuria after pulling of condom cath around 3 am. No hematuria noted on wet pads. Complained of dysuria and burning. When pt asked if these symptoms were new he was irritated and did not want to answer stating I don't know.     NP Ouma notified of all above.

## 2020-06-12 NOTE — Progress Notes (Signed)
PROGRESS NOTE    Craig [name]  [id number] DOB: [birth year] DOA: 05/26/2020 PCP: [name], MD   Brief Narrative: Taken from prior notes. Craig [name] is a 79 y.o. male with medical history significant for CAD with history of CABG in the 1990s, a flutter, HTN, history of TIA, who previously lived alone and now lives with daughter, recently hospitalized in November 2021 with altered mental status and agitation related to dementia and hyponatremia with stay complicated by a flutter with slow ventricular response, discharged on 11/8, returning to the ED on 11/30 with a concern for ingestion, who now returns to the emergency room with a several day history of not acting himself and with a complaint of abdominal pain.  Found to have incarcerated hernia with gangrene s/p repair. Patient did require TPN, not tolerating diet well. PT is recommending SNF placement, had bed offer, pending insurance authorization.  Subjective: Patient was asking for urinal when seen today.  No other complaint.  Assessment & Plan:   Principal Problem:   SBO (small bowel obstruction) (HCC) Active Problems:   CAD (coronary artery disease), native coronary artery   History of coronary artery stent placement   Essential hypertension   Dementia with behavioral disturbance (HCC)   Atrial flutter with rapid ventricular response (HCC)   Hypernatremia   Nicotine dependence   HFrEF (heart failure with reduced ejection fraction) ([place])  Incarcerated right inguinal hernia: w/ strangulated(gangrenous)knuckle of small bowelwith perforation, small bowel obstruction and right spermatic cord abscess.  S/p repair. Taken off from TPN on 06/04/2020.  Completed the course of antibiotics.  General surgery signed off.  Tolerating diet well and having bowel movement. -Recommending SNF placement-had bed offers -pending insurance authorization.  Dysuria.  There was some concern of dysuria by nursing staff.   Patient denies any complaint.  UA sent and it does not look infected.  A flutter with RVR.  Currently rate well controlled.  Not a candidate for anticoagulation due to high fall risk. -Continue with metoprolol.  HFrEF.  Echocardiogram with EF of 25 to 30%, hypokinesia and diastolic parameters were indeterminate.  Appears euvolemic. -Continue with metoprolol.  Metabolic encephalopathy.  Resolved.  Hypokalemia/hypophosphatemia.  Resolved  Dementia with behavioral disturbances.  Appears to be at his baseline. -Continue with donepezil. -Continue with supportive care  Objective: Vitals:   06/11/20 1150 06/11/20 1525 06/11/20 1954 06/12/20 0453  BP: 125/72 129/84 112/68 135/74  Pulse: 63 63 63 61  Resp: 18 20 18 16   Temp: (!) 97.5 F (36.4 C) 97.6 F (36.4 C) 98.6 F (37 C) 98.3 F (36.8 C)  TempSrc:   Oral Oral  SpO2: 100% 100% 100% 100%  Weight:        Intake/Output Summary (Last 24 hours) at 06/12/2020 0839 Last data filed at 06/11/2020 1700 Gross per 24 hour  Intake 500 ml  Output --  Net 500 ml   Filed Weights   06/04/20 0213 06/05/20 0452 06/06/20 0345  Weight: 75.9 kg 75.2 kg 73.6 kg    Examination:  General.  Elderly man, in no acute distress. Pulmonary.  Lungs clear bilaterally, normal respiratory effort. CV.  Regular rate and rhythm, no JVD, rub or murmur. Abdomen.  Soft, nontender, nondistended, BS positive. CNS.  Alert and oriented x3.  No focal neurologic deficit. Extremities.  No edema, no cyanosis, pulses intact and symmetrical. Psychiatry.  Judgment and insight appears normal.  DVT prophylaxis: Lovenox Code Status: Full Family Communication: Discussed with patient Disposition Plan:  Status is:  Inpatient  Remains inpatient appropriate because:Inpatient level of care appropriate due to severity of illness   Dispo: The patient is from: Home              Anticipated d/c is to: SNF              Anticipated d/c date is: 1 day              Patient  currently is medically stable to d/c.  Had bed offers-pending insurance authorization.   Consultants:   General surgery-signed off.  Procedures:  Antimicrobials:   Data Reviewed: I have personally reviewed following labs and imaging studies  CBC: Recent Labs  Lab 06/06/20 0453 06/07/20 0550 06/08/20 1008 06/09/20 0420  WBC 5.2 4.6 4.6 4.0  HGB 9.9* 9.9* 10.3* 9.6*  HCT 30.6* 30.0* 31.6* 30.8*  MCV 98.1 96.8 97.2 100.3*  PLT 219 242 260 469   Basic Metabolic Panel: Recent Labs  Lab 06/06/20 0453 06/07/20 0550 06/08/20 1008 06/09/20 0420  NA 133* 136 138 140  K 3.9 3.6 3.5 4.2  CL 100 104 105 106  CO2 26 25 27 28   GLUCOSE 97 94 131* 86  BUN 19 20 18 19   CREATININE 0.71 0.68 0.81 0.82  CALCIUM 8.1* 8.0* 8.1* 8.1*  MG 2.1  --   --   --    GFR: Estimated Creatinine Clearance: 78.5 mL/min (by C-G formula based on SCr of 0.82 mg/dL). Liver Function Tests: No results for input(s): AST, ALT, ALKPHOS, BILITOT, PROT, ALBUMIN in the last 168 hours. No results for input(s): LIPASE, AMYLASE in the last 168 hours. No results for input(s): AMMONIA in the last 168 hours. Coagulation Profile: No results for input(s): INR, PROTIME in the last 168 hours. Cardiac Enzymes: No results for input(s): CKTOTAL, CKMB, CKMBINDEX, TROPONINI in the last 168 hours. BNP (last 3 results) No results for input(s): PROBNP in the last 8760 hours. HbA1C: No results for input(s): HGBA1C in the last 72 hours. CBG: Recent Labs  Lab 06/08/20 0751  GLUCAP 77   Lipid Profile: No results for input(s): CHOL, HDL, LDLCALC, TRIG, CHOLHDL, LDLDIRECT in the last 72 hours. Thyroid Function Tests: No results for input(s): TSH, T4TOTAL, FREET4, T3FREE, THYROIDAB in the last 72 hours. Anemia Panel: No results for input(s): VITAMINB12, FOLATE, FERRITIN, TIBC, IRON, RETICCTPCT in the last 72 hours. Sepsis Labs: No results for input(s): PROCALCITON, LATICACIDVEN in the last 168 hours.  No results found  for this or any previous visit (from the past 240 hour(s)).   Radiology Studies: No results found.  Scheduled Meds: . aspirin  81 mg Oral Daily  . Chlorhexidine Gluconate Cloth  6 each Topical Daily  . donepezil  10 mg Oral QHS  . enoxaparin (LOVENOX) injection  40 mg Subcutaneous Q24H  . feeding supplement  237 mL Oral TID BM  . losartan  25 mg Oral Daily  . mouth rinse  15 mL Mouth Rinse BID  . metoprolol succinate  25 mg Oral Daily  . multivitamin with minerals  1 tablet Oral Daily  . pantoprazole  40 mg Oral Daily  . sodium chloride flush  10-40 mL Intracatheter Q12H   Continuous Infusions: . sodium chloride Stopped (06/01/20 0630)     LOS: 16 days   Time spent: 25 minutes.  Lorella Nimrod, MD [facility]  If 7PM-7AM, please contact night-coverage [email or web address]  06/12/2020, 8:39 AM   This record has been created using Systems analyst. Errors have been sought  and corrected,but may not always be located. Such creation errors do not reflect on the standard of care.

## 2020-06-13 NOTE — Progress Notes (Signed)
PROGRESS NOTE    Craig [name]  GNF:[id number] DOB: 04/29/1941 DOA: 05/26/2020 PCP: [name], MD   Brief Narrative: Taken from prior notes. Craig [name] is a 79 y.o. male with medical history significant for CAD with history of CABG in the 1990s, a flutter, HTN, history of TIA, who previously lived alone and now lives with daughter, recently hospitalized in November 2021 with altered mental status and agitation related to dementia and hyponatremia with stay complicated by a flutter with slow ventricular response, discharged on 11/8, returning to the ED on 11/30 with a concern for ingestion, who now returns to the emergency room with a several day history of not acting himself and with a complaint of abdominal pain.  Found to have incarcerated hernia with gangrene s/p repair. Patient did require TPN, not tolerating diet well. PT is recommending SNF placement, had bed offer, pending insurance authorization.  Subjective: Patient was resting comfortably when seen today.  No new complaint.  Assessment & Plan:   Principal Problem:   SBO (small bowel obstruction) (HCC) Active Problems:   CAD (coronary artery disease), native coronary artery   History of coronary artery stent placement   Essential hypertension   Dementia with behavioral disturbance (HCC)   Atrial flutter with rapid ventricular response (HCC)   Hypernatremia   Nicotine dependence   HFrEF (heart failure with reduced ejection fraction) ([place])  Incarcerated right inguinal hernia: w/ strangulated(gangrenous)knuckle of small bowelwith perforation, small bowel obstruction and right spermatic cord abscess.  S/p repair. Taken off from TPN on 06/04/2020.  Completed the course of antibiotics.  General surgery signed off.  Tolerating diet well and having bowel movement. -Recommending SNF placement-had bed offers -pending insurance authorization.  Dysuria.  There was some concern of dysuria by nursing staff.   Patient denies any complaint.  UA sent and it does not look infected.  A flutter with RVR.  Currently rate well controlled.  Not a candidate for anticoagulation due to high fall risk. -Continue with metoprolol.  HFrEF.  Echocardiogram with EF of 25 to 30%, hypokinesia and diastolic parameters were indeterminate.  Appears euvolemic. -Continue with metoprolol.  Metabolic encephalopathy.  Resolved.  Hypokalemia/hypophosphatemia.  Resolved  Dementia with behavioral disturbances.  Appears to be at his baseline. -Continue with donepezil. -Continue with supportive care  Objective: Vitals:   06/12/20 1626 06/12/20 1955 06/13/20 1009 06/13/20 1141  BP: 120/71 (!) 117/95 121/64 (!) 146/72  Pulse: (!) 48 61 62 83  Resp: 16 16 20 16   Temp: 98.3 F (36.8 C) 98.3 F (36.8 C) 98.7 F (37.1 C) 97.8 F (36.6 C)  TempSrc: Oral Oral Oral Oral  SpO2: 100% 100% 100% (!) 81%  Weight:        Intake/Output Summary (Last 24 hours) at 06/13/2020 1352 Last data filed at 06/13/2020 1026 Gross per 24 hour  Intake 120 ml  Output --  Net 120 ml   Filed Weights   06/04/20 0213 06/05/20 0452 06/06/20 0345  Weight: 75.9 kg 75.2 kg 73.6 kg    Examination:  General.  Well-developed elderly man, in no acute distress. Pulmonary.  Lungs clear bilaterally, normal respiratory effort. CV.  Regular rate and rhythm, no JVD, rub or murmur. Abdomen.  Soft, nontender, nondistended, BS positive. CNS.  Alert and oriented x3.  No focal neurologic deficit. Extremities.  No edema, no cyanosis, pulses intact and symmetrical. Psychiatry.  Judgment and insight appears normal.  DVT prophylaxis: Lovenox Code Status: Full Family Communication: Discussed with patient Disposition Plan:  Status is: Inpatient  Remains inpatient appropriate because:Inpatient level of care appropriate due to severity of illness   Dispo: The patient is from: Home              Anticipated d/c is to: SNF              Anticipated d/c  date is: 1 day              Patient currently is medically stable to d/c.  Had bed offers-pending insurance authorization.   Consultants:   General surgery-signed off.  Procedures:  Antimicrobials:   Data Reviewed: I have personally reviewed following labs and imaging studies  CBC: Recent Labs  Lab 06/07/20 0550 06/08/20 1008 06/09/20 0420  WBC 4.6 4.6 4.0  HGB 9.9* 10.3* 9.6*  HCT 30.0* 31.6* 30.8*  MCV 96.8 97.2 100.3*  PLT 242 260 [id number]   Basic Metabolic Panel: Recent Labs  Lab 06/07/20 0550 06/08/20 1008 06/09/20 0420  NA 136 138 140  K 3.6 3.5 4.2  CL 104 105 106  CO2 25 27 28   GLUCOSE 94 131* 86  BUN 20 18 19   CREATININE 0.68 0.81 0.82  CALCIUM 8.0* 8.1* 8.1*   GFR: Estimated Creatinine Clearance: 78.5 mL/min (by C-G formula based on SCr of 0.82 mg/dL). Liver Function Tests: No results for input(s): AST, ALT, ALKPHOS, BILITOT, PROT, ALBUMIN in the last 168 hours. No results for input(s): LIPASE, AMYLASE in the last 168 hours. No results for input(s): AMMONIA in the last 168 hours. Coagulation Profile: No results for input(s): INR, PROTIME in the last 168 hours. Cardiac Enzymes: No results for input(s): CKTOTAL, CKMB, CKMBINDEX, TROPONINI in the last 168 hours. BNP (last 3 results) No results for input(s): PROBNP in the last 8760 hours. HbA1C: No results for input(s): HGBA1C in the last 72 hours. CBG: Recent Labs  Lab 06/08/20 0751  GLUCAP 77   Lipid Profile: No results for input(s): CHOL, HDL, LDLCALC, TRIG, CHOLHDL, LDLDIRECT in the last 72 hours. Thyroid Function Tests: No results for input(s): TSH, T4TOTAL, FREET4, T3FREE, THYROIDAB in the last 72 hours. Anemia Panel: No results for input(s): VITAMINB12, FOLATE, FERRITIN, TIBC, IRON, RETICCTPCT in the last 72 hours. Sepsis Labs: No results for input(s): PROCALCITON, LATICACIDVEN in the last 168 hours.  No results found for this or any previous visit (from the past 240 hour(s)).   Radiology  Studies: No results found.  Scheduled Meds: . aspirin  81 mg Oral Daily  . Chlorhexidine Gluconate Cloth  6 each Topical Daily  . donepezil  10 mg Oral QHS  . enoxaparin (LOVENOX) injection  40 mg Subcutaneous Q24H  . feeding supplement  237 mL Oral TID BM  . losartan  25 mg Oral Daily  . mouth rinse  15 mL Mouth Rinse BID  . metoprolol succinate  25 mg Oral Daily  . multivitamin with minerals  1 tablet Oral Daily  . pantoprazole  40 mg Oral Daily  . sodium chloride flush  10-40 mL Intracatheter Q12H   Continuous Infusions: . sodium chloride Stopped (06/01/20 0630)     LOS: 17 days   Time spent: 25 minutes.  Lorella Nimrod, MD [facility]  If 7PM-7AM, please contact night-coverage [email or web address]  06/13/2020, 1:52 PM   This record has been created using Systems analyst. Errors have been sought and corrected,but may not always be located. Such creation errors do not reflect on the standard of care.

## 2020-06-14 LAB — BASIC METABOLIC PANEL
Anion gap: 7 (ref 5–15)
BUN: 19 mg/dL (ref 8–23)
CO2: 27 mmol/L (ref 22–32)
Calcium: 8.4 mg/dL — ABNORMAL LOW (ref 8.9–10.3)
Chloride: 102 mmol/L (ref 98–111)
Creatinine, Ser: 0.8 mg/dL (ref 0.61–1.24)
GFR, Estimated: >60 mL/min (ref >60)
Glucose, Bld: 143 mg/dL — ABNORMAL HIGH (ref 70–99)
Potassium: 3.7 mmol/L (ref 3.5–5.1)
Sodium: 136 mmol/L (ref 135–145)

## 2020-06-14 LAB — CBC
HCT: 31.6 % — ABNORMAL LOW (ref 39.0–52.0)
Hemoglobin: 10.1 g/dL — ABNORMAL LOW (ref 13.0–17.0)
MCH: 31.5 pg (ref 26.0–34.0)
MCHC: 32 g/dL (ref 30.0–36.0)
MCV: 98.4 fL (ref 80.0–100.0)
Platelets: 290 10*3/uL (ref 150–400)
RBC: 3.21 MIL/uL — ABNORMAL LOW (ref 4.22–5.81)
RDW: 13.4 % (ref 11.5–15.5)
WBC: 3 10*3/uL — ABNORMAL LOW (ref 4.0–10.5)
nRBC: 0 % (ref 0.0–0.2)

## 2020-06-14 NOTE — Progress Notes (Signed)
Physical Therapy Treatment Patient Details Name: Craig [name] MRN: [id number] DOB: 1940/07/23 Today's Date: 06/14/2020    History of Present Illness 79 y.o. male with medical history significant for A. fib/flutter not currently on anticoagulation, CAD, HTN, HLD, TIA, and memory loss.  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    PT Comments    Pt awakens easily upon arrival.  Somewhat more engaged today answering some questions but keeps eyes closed mostly during session and arms up over his head/eyes.  He has taken off his external cath and it is taped to his shoulder.  Removed.  He participates minimally with supine ex initially with max cues but towards end of session disengages.  Education provided regarding increasing mobility to maintain function.  He does not assist in attempts for bed mobility and further mobility deferred for pt and staff safety.  SNF remains appropriate for discharge as he is not at baseline and cannot care for himself at this time.   Follow Up Recommendations  SNF;Supervision/Assistance - 24 hour     Equipment Recommendations  None recommended by PT    Recommendations for Other Services       Precautions / Restrictions Precautions Precautions: Fall Restrictions Weight Bearing Restrictions: No    Mobility  Bed Mobility               General bed mobility comments: deferred cue to not engaging with tasks  Transfers                    Ambulation/Gait                 Stairs             Wheelchair Mobility    Modified Rankin (Stroke Patients Only)       Balance                                            Cognition Arousal/Alertness: Lethargic Behavior During Therapy: WFL for tasks assessed/performed Overall Cognitive Status: No family/caregiver present  to determine baseline cognitive functioning                                        Exercises Other Exercises Other Exercises: BLE supine ex x 10  - little assist from pt    General Comments        Pertinent Vitals/Pain Pain Assessment: No/denies pain    Home Living                      Prior Function            PT Goals (current goals can now be found in the care plan section) Progress towards PT goals: Not progressing toward goals - comment    Frequency    Min 2X/week      PT Plan Current plan remains appropriate    Co-evaluation              AM-PAC PT "6 Clicks" Mobility   Outcome Measure  Help needed turning from your back to your side while in a flat bed without using bedrails?: A Lot Help needed moving from lying  on your back to sitting on the side of a flat bed without using bedrails?: A Lot Help needed moving to and from a bed to a chair (including a wheelchair)?: A Lot Help needed standing up from a chair using your arms (e.g., wheelchair or bedside chair)?: A Lot Help needed to walk in hospital room?: A Lot Help needed climbing 3-5 steps with a railing? : Total 6 Click Score: 11    End of Session   Activity Tolerance: Other (comment) Patient left: with call bell/phone within reach;in bed;with nursing/sitter in room Nurse Communication: Mobility status Pain - Right/Left: Right Pain - part of body: Hip     Time: [id number] PT Time Calculation (min) (ACUTE ONLY): 8 min  Charges:  $Therapeutic Exercise: 8-22 mins                    Chesley Noon, PTA 06/14/20, 9:57 AM

## 2020-06-14 NOTE — Care Management Important Message (Signed)
Important Message  Patient Details  Name: Craig [name] MRN: [id number] Date of Birth: 1940-12-19   Medicare Important Message Given:  Yes     Dannette Barbara 06/14/2020, 12:13 PM

## 2020-06-14 NOTE — Progress Notes (Signed)
Patient ambulated today around nursing station with a walker  and sat in the chair for couple hours. Fed himself and ate most of his meals.

## 2020-06-14 NOTE — TOC Progression Note (Signed)
Transition of Care [facility]) - Progression Note    Patient Details  Name: Vahe Pienta MRN: [id number] Date of Birth: Feb 24, 1941  [facility]) CM/SW Contact  Chapman Fitch, RN Phone Number: 06/14/2020, 2:34 PM  Clinical Narrative:     Alexander Bergeron at [facility].  Her mailbox is full.   Call the main number to the facility.  No answer        Expected Discharge Plan and Services                                                 Social Determinants of Health (SDOH) Interventions    Readmission Risk Interventions No flowsheet data found.

## 2020-06-14 NOTE — Progress Notes (Signed)
PROGRESS NOTE    Craig [name]  ZOX:[id number] DOB: Jan 20, 1941 DOA: 05/26/2020 PCP: [name], MD   Brief Narrative: Taken from prior notes. Craig [name] is a 79 y.o. male with medical history significant for CAD with history of CABG in the 1990s, a flutter, HTN, history of TIA, who previously lived alone and now lives with daughter, recently hospitalized in November 2021 with altered mental status and agitation related to dementia and hyponatremia with stay complicated by a flutter with slow ventricular response, discharged on 11/8, returning to the ED on 11/30 with a concern for ingestion, who now returns to the emergency room with a several day history of not acting himself and with a complaint of abdominal pain.  Found to have incarcerated hernia with gangrene s/p repair. Patient did require TPN, not tolerating diet well. PT is recommending SNF placement, had bed offer, pending insurance authorization.  Subjective: Patient was complaining of feeling little weak.  Has not been out of bed since yesterday.  Assessment & Plan:   Principal Problem:   SBO (small bowel obstruction) (HCC) Active Problems:   CAD (coronary artery disease), native coronary artery   History of coronary artery stent placement   Essential hypertension   Dementia with behavioral disturbance (HCC)   Atrial flutter with rapid ventricular response (HCC)   Hypernatremia   Nicotine dependence   HFrEF (heart failure with reduced ejection fraction) ([place])  Incarcerated right inguinal hernia: w/ strangulated(gangrenous)knuckle of small bowelwith perforation, small bowel obstruction and right spermatic cord abscess.  S/p repair. Taken off from TPN on 06/04/2020.  Completed the course of antibiotics.  General surgery signed off.  Tolerating diet well and having bowel movement. -Recommending SNF placement-had bed offers -pending insurance authorization.  Feeling weak.  Staying in bed most of the time.   Labs okay. -Asked nursing staff to put him in bed and ambulate him.  Dysuria.  There was some concern of dysuria by nursing staff.  Patient denies any complaint.  UA sent and it does not look infected.  A flutter with RVR.  Currently rate well controlled.  Not a candidate for anticoagulation due to high fall risk. -Continue with metoprolol.  HFrEF.  Echocardiogram with EF of 25 to 30%, hypokinesia and diastolic parameters were indeterminate.  Appears euvolemic. -Continue with metoprolol.  Metabolic encephalopathy.  Resolved.  Hypokalemia/hypophosphatemia.  Resolved  Dementia with behavioral disturbances.  Appears to be at his baseline. -Continue with donepezil. -Continue with supportive care  Objective: Vitals:   06/13/20 1607 06/13/20 2018 06/14/20 0451 06/14/20 1203  BP: 120/76 130/70 139/84 117/60  Pulse: 62 60 61 63  Resp: 16 15 16 20   Temp: 97.7 F (36.5 C) 98.8 F (37.1 C) 98.6 F (37 C) 98.3 F (36.8 C)  TempSrc: Oral   Oral  SpO2: 100% 100% 100% 100%  Weight:        Intake/Output Summary (Last 24 hours) at 06/14/2020 1443 Last data filed at 06/14/2020 1300 Gross per 24 hour  Intake 720 ml  Output --  Net 720 ml   Filed Weights   06/04/20 0213 06/05/20 0452 06/06/20 0345  Weight: 75.9 kg 75.2 kg 73.6 kg    Examination:  General.  Elderly gentleman, in no acute distress. Pulmonary.  Lungs clear bilaterally, normal respiratory effort. CV.  Regular rate and rhythm, no JVD, rub or murmur. Abdomen.  Soft, nontender, nondistended, BS positive. CNS.  Alert and oriented x3.  No focal neurologic deficit. Extremities.  No edema, no cyanosis,  pulses intact and symmetrical. Psychiatry.  Judgment and insight appears normal.  DVT prophylaxis: Lovenox Code Status: Full Family Communication: Discussed with patient Disposition Plan:  Status is: Inpatient  Remains inpatient appropriate because:Inpatient level of care appropriate due to severity of  illness   Dispo: The patient is from: Home              Anticipated d/c is to: SNF              Anticipated d/c date is: 1 day              Patient currently is medically stable to d/c.  Had bed offers-pending insurance authorization.   Consultants:   General surgery-signed off.  Procedures:  Antimicrobials:   Data Reviewed: I have personally reviewed following labs and imaging studies  CBC: Recent Labs  Lab 06/08/20 1008 06/09/20 0420 06/14/20 1130  WBC 4.6 4.0 3.0*  HGB 10.3* 9.6* 10.1*  HCT 31.6* 30.8* 31.6*  MCV 97.2 100.3* 98.4  PLT 260 256 989   Basic Metabolic Panel: Recent Labs  Lab 06/08/20 1008 06/09/20 0420 06/14/20 1029  NA 138 140 136  K 3.5 4.2 3.7  CL 105 106 102  CO2 27 28 27   GLUCOSE 131* 86 143*  BUN 18 19 19   CREATININE 0.81 0.82 0.80  CALCIUM 8.1* 8.1* 8.4*   GFR: Estimated Creatinine Clearance: 80.5 mL/min (by C-G formula based on SCr of 0.8 mg/dL). Liver Function Tests: No results for input(s): AST, ALT, ALKPHOS, BILITOT, PROT, ALBUMIN in the last 168 hours. No results for input(s): LIPASE, AMYLASE in the last 168 hours. No results for input(s): AMMONIA in the last 168 hours. Coagulation Profile: No results for input(s): INR, PROTIME in the last 168 hours. Cardiac Enzymes: No results for input(s): CKTOTAL, CKMB, CKMBINDEX, TROPONINI in the last 168 hours. BNP (last 3 results) No results for input(s): PROBNP in the last 8760 hours. HbA1C: No results for input(s): HGBA1C in the last 72 hours. CBG: Recent Labs  Lab 06/08/20 0751  GLUCAP 77   Lipid Profile: No results for input(s): CHOL, HDL, LDLCALC, TRIG, CHOLHDL, LDLDIRECT in the last 72 hours. Thyroid Function Tests: No results for input(s): TSH, T4TOTAL, FREET4, T3FREE, THYROIDAB in the last 72 hours. Anemia Panel: No results for input(s): VITAMINB12, FOLATE, FERRITIN, TIBC, IRON, RETICCTPCT in the last 72 hours. Sepsis Labs: No results for input(s): PROCALCITON,  LATICACIDVEN in the last 168 hours.  No results found for this or any previous visit (from the past 240 hour(s)).   Radiology Studies: No results found.  Scheduled Meds:  aspirin  81 mg Oral Daily   Chlorhexidine Gluconate Cloth  6 each Topical Daily   donepezil  10 mg Oral QHS   enoxaparin (LOVENOX) injection  40 mg Subcutaneous Q24H   feeding supplement  237 mL Oral TID BM   losartan  25 mg Oral Daily   metoprolol succinate  25 mg Oral Daily   multivitamin with minerals  1 tablet Oral Daily   pantoprazole  40 mg Oral Daily   sodium chloride flush  10-40 mL Intracatheter Q12H   Continuous Infusions:  sodium chloride Stopped (06/01/20 0630)     LOS: 18 days   Time spent: 25 minutes.  Lorella Nimrod, MD [facility]  If 7PM-7AM, please contact night-coverage [email or web address]  06/14/2020, 2:43 PM   This record has been created using Systems analyst. Errors have been sought and corrected,but may not always be located. Such creation errors do  not reflect on the standard of care.

## 2020-06-14 NOTE — TOC Progression Note (Signed)
Transition of Care [facility]) - Progression Note    Patient Details  Name: Craig [name] MRN: [id number] Date of Birth: 08-Apr-1941  [facility] of [facility]) CM/SW Contact  [place] Cellar, RN Phone Number: 06/14/2020, 3:08 PM  Clinical Narrative:    Call to [facility] to follow up on pending insurance auth-notified [facility] request would need to be started through [employer] @ [phone].   Call to [employer]-confirmed [name] was started by [facility] 12/23 and later cancelled 12/24 due to no clinical being sent.  Writer restarted auth request and faxed clinical to [phone] as requested. Confirmed with Te[facility] patient still had bed availability pending insurance authorization. Request is under Intake IO#[id number] and started as expedited.         Expected Discharge Plan and Services                                                 Social Determinants of Health (SDOH) Interventions    Readmission Risk Interventions No flowsheet data found.

## 2020-06-14 NOTE — Discharge Summary (Signed)
Physician Discharge Summary  Craig [name] [id number] DOB: [birth year] DOA: 05/26/2020  PCP: [name], MD  Admit date: 05/26/2020 Discharge date: 06/15/2020  Admitted From: Home Disposition:  SNF  Recommendations for Outpatient Follow-up:  1. Follow up with PCP in 1-2 weeks 2. Please obtain BMP/CBC in one week 3. Please follow up on the following pending results:None  Home Health:No Equipment/Devices: None Discharge Condition: Stable CODE STATUS: Full Diet recommendation: Heart Healthy   Brief/Interim Summary: Craig [name] a 79 y.o.malewith medical history significant forCAD with history of CABG in the 1990s, a flutter, HTN, history of TIA, who previously lived alone and now lives with daughter, recently hospitalized in November 2021 with altered mental status and agitation related to dementia and hyponatremia with stay complicated by a flutter with slow ventricular response, discharged on 11/8,returning to the ED on 11/30 with a concern for ingestion, who now returns to the emergency room with a several day history of not acting himself and with a complaint of abdominal pain.  Found to have incarcerated hernia with gangrene and perforation s/p repair of hernia and right spermatic cord abscess.  Initially treated in ICU and then later transitioned to hospitalist service.  He did receive TPN, later started tolerating p.o. now on regular diet.  Completed the course of antibiotics while in the hospital. He had prolonged hospitalization and severe physical deconditioning.  Feeling weak.  Craig [name] evaluated him and recommending rehab.  He is being discharged to rehab for further management.  Patient had multiple electrolyte derangements during hospitalization which were corrected as needed.  Patient has an history of a flutter with RVR on admission.  Currently heart rate well controlled.  Not a candidate for anticoagulation due to high fall risk.  He will continue with  metoprolol.  Patient also has an history of HFrEF with EF of 25 to 30%, global hypokinesia and diastolic parameters were indeterminate.  Appears euvolemic.  We will continue with metoprolol.  Patient has advanced dementia with behavioral disturbances.  Continue with donepezil and supportive care.   Discharge Diagnoses:  Principal Problem:   SBO (small bowel obstruction) (HCC) Active Problems:   CAD (coronary artery disease), native coronary artery   History of coronary artery stent placement   Essential hypertension   Dementia with behavioral disturbance (HCC)   Atrial flutter with rapid ventricular response (HCC)   Hypernatremia   Nicotine dependence   HFrEF (heart failure with reduced ejection fraction) [facility])   Discharge Instructions  Discharge Instructions    Amb referral to [facility]   Complete by: As directed    Diet - low sodium heart healthy   Complete by: As directed    Increase activity slowly   Complete by: As directed    No wound care   Complete by: As directed      Allergies as of 06/15/2020      Reactions   Penicillins       Medication List    STOP taking these medications   lisinopril 20 MG tablet Commonly known as: ZESTRIL   oxybutynin 5 MG tablet Commonly known as: DITROPAN     TAKE these medications   aspirin EC 81 MG tablet Take 81 mg by mouth daily. Swallow whole.   donepezil 10 MG tablet Commonly known as: ARICEPT TK 1 T PO HS FOR MEMORY   feeding supplement Liqd Take 237 mLs by mouth 3 (three) times daily between meals.   losartan 25 MG tablet Commonly known as: COZAAR Take  1 tablet (25 mg total) by mouth daily.   metoprolol succinate 25 MG 24 hr tablet Commonly known as: TOPROL-XL Take 1 tablet (25 mg total) by mouth daily.   multivitamin with minerals Tabs tablet Take 1 tablet by mouth daily.   pantoprazole 40 MG tablet Commonly known as: PROTONIX Take 1 tablet (40 mg total) by mouth daily.       Contact information  for follow-up providers    Tylene Fantasia, PA-C. Schedule an appointment as soon as possible for a visit in 2 week(s).   Specialty: Physician Assistant Why: s/p emergent incarcerated right inguinal hernia repair Contact information: [address] [place] [address] [phone]        [name], MD. Schedule an appointment as soon as possible for a visit.   Specialty: Family Medicine Contact information: [place] [address] [phone]            Contact information for after-discharge care    Destination    [facility] .   Service: Skilled Nursing Contact information: [address] [address] [phone]                 Allergies  Allergen Reactions  . Penicillins     Consultations:  General surgery  Procedures/Studies: DG Chest 2 View  Result Date: 05/26/2020 CLINICAL DATA:  We, altered mental status. Abdominal pain. Decreased appetite and mobility. EXAM: CHEST - 2 VIEW COMPARISON:  Chest x-ray 04/20/2020 FINDINGS: The heart size and mediastinal contours are unchanged. Aortic arch calcification. No focal consolidation. No pulmonary edema. No pleural effusion. No pneumothorax. No acute osseous abnormality. IMPRESSION: No active cardiopulmonary disease. Electronically Signed   By: Iven Finn M.D.   On: 05/26/2020 20:09   DG Abd 1 View  Result Date: 05/27/2020 CLINICAL DATA:  NG tube placement EXAM: ABDOMEN - 1 VIEW COMPARISON:  None. FINDINGS: Mildly dilated air-filled loop of probable small bowel seen within the mid abdomen. Tip the NG tube is seen just entering the stomach with the side hole in the distal esophagus. No radio-opaque calculi or other significant radiographic abnormality are seen. IMPRESSION: Tip the NG tube just entering the proximal stomach and could be advanced several cm. Electronically Signed   By: Prudencio Pair M.D.   On: 05/27/2020 03:24    CT ABDOMEN PELVIS W CONTRAST  Result Date: 05/26/2020 CLINICAL DATA:  Abdominal distension, abdominal pain. Decreased appetite and mobility EXAM: CT ABDOMEN AND PELVIS WITH CONTRAST TECHNIQUE: Multidetector CT imaging of the abdomen and pelvis was performed using the standard protocol following bolus administration of intravenous contrast. CONTRAST:  129mL OMNIPAQUE IOHEXOL 300 MG/ML  SOLN COMPARISON:  CT abdomen pelvis 10/31/2017. FINDINGS: Lower chest: Elevated left hemidiaphragm with compressive changes at the left base. Fluid noted within the esophageal lumen. Coronary artery calcifications. Hepatobiliary: Redemonstration of an ill-defined hypodense right hepatic lesion measuring 3.2 x 2.6 cm (from 3.9 x 3.3 cm). Persistent calcific density along the peripheral right hepatic dome the could represent sequela of prior granulomatous disease. No gallstones, gallbladder wall thickening, or pericholecystic fluid. No biliary dilatation. Pancreas: No focal lesion. Normal pancreatic contour. No surrounding inflammatory changes. No main pancreatic ductal dilatation. Spleen: Normal in size without focal abnormality. Adrenals/Urinary Tract: No adrenal nodule bilaterally. Bilateral kidneys enhance symmetrically. No hydronephrosis. No hydroureter. On delayed imaging, there is no urothelial wall thickening and there are no filling defects in the opacified portions of the bilateral collecting systems  or ureters. The urinary bladder is unremarkable. Stomach/Bowel: The gastric lumen is dilated with fluid. Markedly proximal and mid dilated small bowel loops with a transition point at the entrance of the right inguinal hernia (11:84, 5:40). The short segment of small bowel within the right inguinal hernia also is noted to be distended with an air-fluid level within other likely transition point at the exit of the small bowel. The small bowel distally is completely collapsed. Stool within the colon. No small or large bowel  wall thickening. No large bowel dilatation. No definite pneumatosis. Vascular/Lymphatic: No abdominal aorta or iliac aneurysm. Severe atherosclerotic plaque of the aorta and its branches. No abdominal, pelvic, or inguinal lymphadenopathy. Reproductive: Multiple round metallic densities within and surrounding the prostate gland consistent with radioactive seeds. Prostate is enlarged measuring up to at least 5 cm. Other: No intraperitoneal free fluid. No intraperitoneal free gas. No organized fluid collection. Musculoskeletal: Severe soft tissue edema. Redemonstration of multiple fat density lesion within superficial anterior abdominal subcutaneus soft tissues that likely represent lipomatous lesions. The largest measures 1.5 x 0.9 cm (11:23). Redemonstration (new from 10/31/2017, retrospectively visualized on 04/20/2020) of a lytic lesion within the L2 vertebral body with an associated pathologic fracture of the superior endplate (5: 68, 11:42). Otherwise no new acute displaced fracture. Old healed left rib fractures. Multilevel degenerative changes of the spine. IMPRESSION: 1. High-grade small bowel obstruction with a transition point at the entrance of a right inguinal hernia. No bowel perforation. Recommend surgical consultation. 2. Additional closed-loop obstruction of a short loop of small bowel contained within the right inguinal hernia suggesting possible incarceration. Recommend surgical consultation. 3. Fluid within the esophageal lumen with a distended gastric lumen. Recommend enteric tube placement. 4. Indeterminate L2 lytic lesion with associated pathologic superior endplate fracture. 5. Indeterminate 3.2 cm ill-defined hypodense lesion within the right hepatic lobe. Consider MRI liver protocol for further evaluation. 6. Prostatomegaly with associated radioactive seeds. 7. Other imaging findings of potential clinical significance: Multiple fat density lesions within superficial anterior abdominal  subcutaneus soft tissues that likely represent lipomatous lesions. Aortic Atherosclerosis (ICD10-I70.0). These results were called by telephone at the time of interpretation on 05/26/2020 at 10:30 pm to provider [facility] , who verbally acknowledged these results. Electronically Signed   By: Tish Frederickson M.D.   On: 05/26/2020 22:45   DG Chest Port 1 View  Result Date: 06/01/2020 CLINICAL DATA:  Central line placement EXAM: PORTABLE CHEST 1 VIEW COMPARISON:  05/27/2020, CT 04/24/2020 FINDINGS: Right upper extremity central venous catheter tip partially obscured by telemetry lead, it appears to project over the cavoatrial region. Endotracheal and esophageal tubes have been removed. Cardiomegaly with mild central congestion. Worsened consolidation at left lung base. Possible left pleural effusion. IMPRESSION: 1. Interval removal of endotracheal and esophageal tubes. Right upper extremity central venous catheter tip partially obscured by overlying telemetry leads, the tip appears to project over the cavoatrial region. 2. Cardiomegaly with vascular congestion. Worsened consolidation at the left lung base with possible left effusion. Electronically Signed   By: Jasmine Pang M.D.   On: 06/01/2020 18:18   DG Chest Port 1 View  Result Date: 05/27/2020 CLINICAL DATA:  Intubation. EXAM: PORTABLE CHEST 1 VIEW COMPARISON:  05/26/2020 FINDINGS: The ET tube is 4.9 cm above the carina. The NG tube is coursing down the esophagus and into the stomach. The heart is enlarged but appears stable. Stable tortuosity and calcification of the thoracic aorta. No infiltrates, effusions or edema. Streaky bibasilar atelectasis. IMPRESSION: 1.  ET tube and NG tubes in good position. 2. Streaky bibasilar atelectasis. Electronically Signed   By: Marijo Sanes M.D.   On: 05/27/2020 05:41   DG Abd Portable 1V  Result Date: 05/31/2020 CLINICAL DATA:  Abdominal pain.  Ileus. EXAM: PORTABLE ABDOMEN - 1 VIEW COMPARISON:  03/27/2020  FINDINGS: Moderate amount of gas throughout small and large bowel consistent with the clinical history of ileus. No focal dilatation to suggest obstruction. No sign of free air. Midline skin staples in the lower abdomen. IMPRESSION: Moderate amount of gas throughout small and large bowel consistent with the clinical history of ileus. Electronically Signed   By: Nelson Chimes M.D.   On: 05/31/2020 09:22   ECHOCARDIOGRAM COMPLETE  Result Date: 06/01/2020    ECHOCARDIOGRAM REPORT   Patient Name:   FERNANDEZ WEITZEL Cosner Date of Exam: 06/01/2020 Medical Rec #:  [id number]            Height:       74.0 in Accession #:    [id number]           Weight:       165.3 lb Date of Birth:  28-May-1941             BSA:          2.005 m Patient Age:    42 years             BP:           146/92 mmHg Patient Gender: M                    HR:           69 bpm. Exam Location:  [facility] Procedure: 2D Echo, Color Doppler and Cardiac Doppler Indications:     I48.92 Atrial Flutter  History:         Patient has no prior history of Echocardiogram examinations.                  TIA; Risk Factors:Hypertension and Dyslipidemia.  Sonographer:     Charmayne Sheer RDCS (AE) Referring Phys:  [id number] Rise Mu Diagnosing Phys: Nelva Bush MD  Sonographer Comments: Image acquisition challenging due to uncooperative patient. IMPRESSIONS  1. Left ventricular ejection fraction, by estimation, is 25 to 30%. The left ventricle has severely decreased function. The left ventricle demonstrates global hypokinesis. There is moderate left ventricular hypertrophy. Left ventricular diastolic parameters are indeterminate.  2. Right ventricular systolic function is mildly reduced. The right ventricular size is normal. Tricuspid regurgitation signal is inadequate for assessing PA pressure.  3. Left atrial size was mildly dilated.  4. Right atrial size was mildly dilated.  5. A small pericardial effusion is present. The pericardial effusion is circumferential.  6. The  mitral valve is normal in structure. Mild to moderate mitral valve regurgitation. There is mild holosystolic prolapse of of the mitral valve.  7. The aortic valve is tricuspid. Aortic valve regurgitation is not visualized. No aortic stenosis is present.  8. Aortic dilatation noted. There is mild dilatation of the aortic root, measuring 40 mm. FINDINGS  Left Ventricle: Left ventricular ejection fraction, by estimation, is 25 to 30%. The left ventricle has severely decreased function. The left ventricle demonstrates global hypokinesis. The left ventricular internal cavity size was normal in size. There is moderate left ventricular hypertrophy. Left ventricular diastolic parameters are indeterminate. Right Ventricle: The right ventricular size is normal. No increase in right ventricular wall thickness. Right ventricular  systolic function is mildly reduced. Tricuspid regurgitation signal is inadequate for assessing PA pressure. Left Atrium: Left atrial size was mildly dilated. Right Atrium: Right atrial size was mildly dilated. Pericardium: A small pericardial effusion is present. The pericardial effusion is circumferential. Mitral Valve: The mitral valve is normal in structure. There is mild holosystolic prolapse of of the mitral valve. Mild to moderate mitral valve regurgitation. MV peak gradient, 2.5 mmHg. The mean mitral valve gradient is 1.0 mmHg. Tricuspid Valve: The tricuspid valve is normal in structure. Tricuspid valve regurgitation is trivial. Aortic Valve: The aortic valve is tricuspid. Aortic valve regurgitation is not visualized. No aortic stenosis is present. Aortic valve mean gradient measures 2.0 mmHg. Aortic valve peak gradient measures 3.1 mmHg. Aortic valve area, by VTI measures 3.97 cm. Pulmonic Valve: The pulmonic valve was normal in structure. Pulmonic valve regurgitation is trivial. No evidence of pulmonic stenosis. Aorta: Aortic dilatation noted. There is mild dilatation of the aortic root,  measuring 40 mm. Venous: The inferior vena cava was not well visualized. IAS/Shunts: No atrial level shunt detected by color flow Doppler.  LEFT VENTRICLE PLAX 2D LVIDd:         5.14 cm LVIDs:         4.60 cm LV PW:         1.44 cm LV IVS:        1.32 cm LVOT diam:     2.50 cm LV SV:         47 LV SV Index:   24 LVOT Area:     4.91 cm  LEFT ATRIUM           Index       RIGHT ATRIUM           Index LA diam:      4.40 cm 2.19 cm/m  RA Area:     18.87 cm 9.41 cm/m LA Vol (A4C): 71.2 ml 35.52 ml/m  AORTIC VALVE                   PULMONIC VALVE AV Area (Vmax):    3.53 cm    PV Vmax:       0.70 m/s AV Area (Vmean):   2.99 cm    PV Vmean:      48.000 cm/s AV Area (VTI):     3.97 cm    PV VTI:        0.131 m AV Vmax:           88.40 cm/s  PV Peak grad:  2.0 mmHg AV Vmean:          60.900 cm/s PV Mean grad:  1.0 mmHg AV VTI:            0.119 m AV Peak Grad:      3.1 mmHg AV Mean Grad:      2.0 mmHg LVOT Vmax:         63.50 cm/s LVOT Vmean:        37.100 cm/s LVOT VTI:          0.096 m LVOT/AV VTI ratio: 0.81  AORTA Ao Root diam: 4.00 cm MITRAL VALVE MV Area (PHT): 5.26 cm    SHUNTS MV Peak grad:  2.5 mmHg    Systemic VTI:  0.10 m MV Mean grad:  1.0 mmHg    Systemic Diam: 2.50 cm MV Vmax:       0.78 m/s MV Vmean:      46.0 cm/s MV Decel  Time: 144 msec MV E velocity: 80.20 cm/s Nelva Bush MD Electronically signed by Nelva Bush MD Signature Date/Time: 06/01/2020/12:23:56 PM    Final    [place] EKG SITE RITE  Result Date: 06/01/2020 If Site Rite image not attached, placement could not be confirmed due to current cardiac rhythm.   Subjective: Patient was seen and examined during morning rounds.  No new complaint.  Discharge Exam: Vitals:   06/15/20 0500 06/15/20 0928  BP: 123/74 129/72  Pulse: 61 62  Resp: 15   Temp: 98.5 F (36.9 C)   SpO2: 100%    Vitals:   06/14/20 2118 06/14/20 2342 06/15/20 0500 06/15/20 0928  BP: 125/74 (!) 106/59 123/74 129/72  Pulse: 67 66 61 62  Resp: 18 18 15     Temp: 98.6 F (37 C) 97.8 F (36.6 C) 98.5 F (36.9 C)   TempSrc: Oral     SpO2: 100% 99% 100%   Weight:        General: Craig [name] is alert, awake, not in acute distress Cardiovascular: RRR, S1/S2 +, no rubs, no gallops Respiratory: CTA bilaterally, no wheezing, no rhonchi Abdominal: Soft, NT, ND, bowel sounds + Extremities: no edema, no cyanosis   The results of significant diagnostics from this hospitalization (including imaging, microbiology, ancillary and laboratory) are listed below for reference.    Microbiology: No results found for this or any previous visit (from the past 240 hour(s)).   Labs: BNP (last 3 results) Recent Labs    05/26/20 2024  BNP 0000000*   Basic Metabolic Panel: Recent Labs  Lab 06/09/20 0420 06/14/20 1029  NA 140 136  K 4.2 3.7  CL 106 102  CO2 28 27  GLUCOSE 86 143*  BUN 19 19  CREATININE 0.82 0.80  CALCIUM 8.1* 8.4*   Liver Function Tests: No results for input(s): AST, ALT, ALKPHOS, BILITOT, PROT, ALBUMIN in the last 168 hours. No results for input(s): LIPASE, AMYLASE in the last 168 hours. No results for input(s): AMMONIA in the last 168 hours. CBC: Recent Labs  Lab 06/09/20 0420 06/14/20 1130  WBC 4.0 3.0*  HGB 9.6* 10.1*  HCT 30.8* 31.6*  MCV 100.3* 98.4  PLT 256 290   Cardiac Enzymes: No results for input(s): CKTOTAL, CKMB, CKMBINDEX, TROPONINI in the last 168 hours. BNP: Invalid input(s): POCBNP CBG: No results for input(s): GLUCAP in the last 168 hours. D-Dimer No results for input(s): DDIMER in the last 72 hours. Hgb A1c No results for input(s): HGBA1C in the last 72 hours. Lipid Profile No results for input(s): CHOL, HDL, LDLCALC, TRIG, CHOLHDL, LDLDIRECT in the last 72 hours. Thyroid function studies No results for input(s): TSH, T4TOTAL, T3FREE, THYROIDAB in the last 72 hours.  Invalid input(s): FREET3 Anemia work up No results for input(s): VITAMINB12, FOLATE, FERRITIN, TIBC, IRON, RETICCTPCT in the last 72  hours. Urinalysis    Component Value Date/Time   COLORURINE YELLOW (A) 06/11/2020 1141   APPEARANCEUR CLOUDY (A) 06/11/2020 1141   LABSPEC 1.012 06/11/2020 1141   PHURINE 7.0 06/11/2020 1141   GLUCOSEU NEGATIVE 06/11/2020 1141   HGBUR NEGATIVE 06/11/2020 1141   BILIRUBINUR NEGATIVE 06/11/2020 1141   KETONESUR NEGATIVE 06/11/2020 1141   PROTEINUR NEGATIVE 06/11/2020 1141   NITRITE NEGATIVE 06/11/2020 1141   LEUKOCYTESUR NEGATIVE 06/11/2020 1141   Sepsis Labs Invalid input(s): PROCALCITONIN,  WBC,  LACTICIDVEN Microbiology No results found for this or any previous visit (from the past 240 hour(s)).  Time coordinating discharge: Over 30 minutes  SIGNED:  Lorella Nimrod,  MD  [facility] 06/15/2020, 12:30 PM  If 7PM-7AM, please contact night-coverage [email or web address]  This record has been created using Systems analyst. Errors have been sought and corrected,but may not always be located. Such creation errors do not reflect on the standard of care.

## 2020-06-14 NOTE — TOC Progression Note (Signed)
Transition of Care [facility]) - Progression Note    Patient Details  Name: Rithik Odea MRN: [id number] Date of Birth: [birth year]  Transition of Care [facility]) CM/SW Contact  Chapman Fitch, RN Phone Number: 06/14/2020, 1:43 PM  Clinical Narrative:     Message sent to [facility] at [facility] to get update on auth.  Awaiting response        Expected Discharge Plan and Services                                                 Social Determinants of Health (SDOH) Interventions    Readmission Risk Interventions No flowsheet data found.

## 2020-06-15 LAB — RESP PANEL BY RT-PCR (FLU A&B, COVID) ARPGX2
Influenza A by PCR: NEGATIVE
Influenza B by PCR: NEGATIVE
SARS Coronavirus 2 by RT PCR: NEGATIVE

## 2020-06-15 MED ORDER — LOSARTAN POTASSIUM 25 MG PO TABS: 25.0000 mg | ORAL_TABLET | Freq: Every day | ORAL | Status: AC

## 2020-06-15 MED ORDER — PANTOPRAZOLE SODIUM 40 MG PO TBEC: 40.0000 mg | DELAYED_RELEASE_TABLET | Freq: Every day | ORAL | Status: AC

## 2020-06-15 MED ORDER — METOPROLOL SUCCINATE ER 25 MG PO TB24: 25.0000 mg | ORAL_TABLET | Freq: Every day | ORAL | Status: DC

## 2020-06-15 NOTE — Progress Notes (Signed)
Report given to April Staff RN at [facility]. Transported via EMS.PICC line removed per MD order.

## 2020-06-15 NOTE — TOC Transition Note (Signed)
Transition of Care [facility]) - CM/SW Discharge Note   Patient Details  Name: Craig [name] MRN: [id number] Date of Birth: June 28, 1940  Transition [facility] Care [facility]) CM/SW Contact:  Chapman Fitch, RN Phone Number: 06/15/2020, 1:09 PM   Clinical Narrative:     Notified by Randa Lynn at [facility] that [name] has been obtained Plan for patient to discharge to [facility] today  DC summary sent in [place] Daughter [place] notified  repeat covid negative EMS packet printed Bedside RN to place on chart  Bedside RN to call report EMS transport arranged   Final next level of care: Skilled Nursing Facility Barriers to Discharge: No Barriers Identified   Patient Goals and CMS Choice        Discharge Placement              Patient chooses bed at: Other - please specify in the comment section below: [facility]) Patient to be transferred to facility by: EMS Name of family member notified: Asher Muir Patient and family notified of of transfer: 06/15/20  Discharge Plan and Services                                     Social Determinants of Health (SDOH) Interventions     Readmission Risk Interventions Readmission Risk Prevention Plan 06/15/2020  Transportation Screening Complete  PCP or Specialist Appt within 3-5 Days Complete  HRI or Home Care Consult (No Data)  Social Work Consult for Recovery Care Planning/Counseling Complete  Palliative Care Screening Not Applicable  Medication Review Ocean[employer]) Complete  Some recent data might be hidden

## 2020-06-15 NOTE — Progress Notes (Signed)
Nutrition Follow Up Note   DOCUMENTATION CODES:   Severe malnutrition in context of social or environmental circumstances  INTERVENTION:   Ensure Enlive po TID, each supplement provides 350 kcal and 20 grams of protein  Magic cup TID with meals, each supplement provides 290 kcal and 9 grams of protein  MVI daily   NUTRITION DIAGNOSIS:   Severe Malnutrition related to social / environmental circumstances (dementia, suspected inadequate oral intake) as evidenced by severe fat depletion,severe muscle depletion.  GOAL:   Patient will meet greater than or equal to 90% of their needs  -progressing   MONITOR:   PO intake,Supplement acceptance,Labs,Skin,I & O's,Weight trends  ASSESSMENT:   79 year old male with PMHx of dementia, HTN, atrial flutter, CAD, HLD, TIA admitted with right inguinal hernia with strangulated knuckle of small bowel, small bowel obstruction, and right spermatic cord abscess s/p right inguinal hernia repair, small bowel resection, and right orchiectomy on 12/9.   Pt with improved appetite and oral intake in hospital; pt eating anywhere from 50-100% of meals and is drinking his Ensure supplements. Per chart, pt is weight stable since admit. Plan is for SNF at discharge; pending insurance authorization.   Medications reviewed and include: aspirin, lovenox, MVI, protonix  Labs reviewed: K 3.7 wnl- 12/27 Hgb 10.1(L), Hct 31.6(L)- 12/27  Diet Order:   Diet Order            DIET SOFT Room service appropriate? Yes; Fluid consistency: Thin  Diet effective now                EDUCATION NEEDS:   No education needs have been identified at this time  Skin:  Skin Assessment: Skin Integrity Issues: Skin Integrity Issues:: Incisions Incisions: closed incisions to abdomen and scrotum  Last BM:  12/27- type 3  Height:   Ht Readings from Last 1 Encounters:  05/18/20 6\' 2"  (1.88 m)   Weight:   Wt Readings from Last 1 Encounters:  06/06/20 73.6 kg   Ideal  Body Weight:  86.4 kg  BMI:  Body mass index is 20.84 kg/m.  Estimated Nutritional Needs:   Kcal:  2000-2200  Protein:  100-110 grams  Fluid:  1.8 L/day  06/08/20 MS, RD, LDN Please refer to [facility] for RD and/or RD on-call/weekend/after hours pager

## 2020-06-15 NOTE — Progress Notes (Signed)
Occupational Therapy Treatment Patient Details Name: Craig [name] MRN: [id number] DOB: 05/03/1941 Today's Date: 06/15/2020    History of present illness 79 y.o. male with medical history significant for A. fib/flutter not currently on anticoagulation, CAD, HTN, HLD, TIA, and memory loss.  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   OT comments  Upon entering the room, pt's bed alarm going off and pt attempting to stand from bed with no clothing on at this time. OT provided pt with shirt and donned with min cuing and set up A. Pt given socks and refusing to don needing total A to don while seated on EOB with supervision for sitting balance. Pt given pants and needing assistance to thread onto B feet and pt pulling over LEs. Pt becoming increasing frustrated as therapist asks him to stand and pull pants over B hips. Pt returning to supine position in bed and begins pulling covers over himself. OT lowers bed and activates bed alarm. Pt continues to benefit from OT intervention with recommendation for SNF at discharge to continue to address functional deficits.    Follow Up Recommendations  SNF    Equipment Recommendations  Other (comment) (defer to next venue of care)       Precautions / Restrictions Precautions Precautions: Fall       Mobility Bed Mobility Overal bed mobility: Needs Assistance Bed Mobility: Supine to Sit;Sit to Supine     Supine to sit: Supervision Sit to supine: Supervision   General bed mobility comments: min cuing for safety awareness  Transfers                 General transfer comment: pt refusal to stand    Balance Overall balance assessment: Needs assistance Sitting-balance support: Feet supported Sitting balance-Leahy Scale: Good                                     ADL either performed  or assessed with clinical judgement   ADL Overall ADL's : Needs assistance/impaired                 Upper Body Dressing : Set up;Sitting Upper Body Dressing Details (indicate cue type and reason): Pt donning shirt with set up A and min cuing Lower Body Dressing: Sitting/lateral leans;Moderate assistance Lower Body Dressing Details (indicate cue type and reason): Pt needing total A to don B socks and needing assist to thread pants onto B feet                     Vision Patient Visual Report: No change from baseline            Cognition Arousal/Alertness: Awake/alert Behavior During Therapy: Restless;Impulsive Overall Cognitive Status: No family/caregiver present to determine baseline cognitive functioning                                 General Comments: Pt attempting to get out of bed with no clothing on. Pt with increased frustrations as therapist provides more cuing for safety awareness. Pt unable to answer orientation questions.                   Pertinent Vitals/ Pain       Pain Assessment: Faces Faces Pain  Scale: No hurt     Prior Functioning/Environment              Frequency  Min 1X/week        Progress Toward Goals  OT Goals(current goals can now be found in the care plan section)  Progress towards OT goals: Progressing toward goals  Acute Rehab OT Goals Patient Stated Goal: "just leave me alone. I only want to be me." OT Goal Formulation: With patient Time For Goal Achievement: 06/21/20 Potential to Achieve Goals: Good  Plan Discharge plan remains appropriate;Frequency remains appropriate       AM-PAC OT "6 Clicks" Daily Activity     Outcome Measure   Help from another person eating meals?: A Little Help from another person taking care of personal grooming?: A Little Help from another person toileting, which includes using toliet, bedpan, or urinal?: A Little Help from another person bathing (including washing,  rinsing, drying)?: A Little Help from another person to put on and taking off regular upper body clothing?: A Little Help from another person to put on and taking off regular lower body clothing?: A Little 6 Click Score: 18    End of Session    OT Visit Diagnosis: Other abnormalities of gait and mobility (R26.89);Muscle weakness (generalized) (M62.81);Other symptoms and signs involving cognitive function   Activity Tolerance Patient tolerated treatment well   Patient Left in bed;with call bell/phone within reach;with bed alarm set;Other (comment) (bed lowered to floor)   Nurse Communication Mobility status        Time: 1351-1414 OT Time Calculation (min): 23 min  Charges: OT General Charges $OT Visit: 1 Visit OT Treatments $Self Care/Home Management : 23-37 mins  Jackquline Denmark, MS, OTR/L , CBIS ascom [phone]  06/15/20, 3:11 PM

## 2020-12-22 ENCOUNTER — Encounter (HOSPITAL_COMMUNITY): Payer: Self-pay | Admitting: Emergency Medicine

## 2020-12-22 ENCOUNTER — Other Ambulatory Visit: Payer: Self-pay

## 2020-12-22 ENCOUNTER — Inpatient Hospital Stay (HOSPITAL_COMMUNITY)
Admission: EM | Admit: 2020-12-22 | Discharge: 2021-01-06 | DRG: 291 | Disposition: A | Discharge status: Skilled Nursing Facility | Payer: Medicare (Managed Care) | Source: Skilled Nursing Facility | Attending: Internal Medicine | Admitting: Internal Medicine

## 2020-12-22 DIAGNOSIS — Z9079 Acquired absence of other genital organ(s): Secondary | ICD-10-CM

## 2020-12-22 DIAGNOSIS — F17220 Nicotine dependence, chewing tobacco, uncomplicated: Secondary | ICD-10-CM | POA: Diagnosis present

## 2020-12-22 DIAGNOSIS — J9601 Acute respiratory failure with hypoxia: Secondary | ICD-10-CM | POA: Diagnosis present

## 2020-12-22 DIAGNOSIS — Z955 Presence of coronary angioplasty implant and graft: Secondary | ICD-10-CM

## 2020-12-22 DIAGNOSIS — I5023 Acute on chronic systolic (congestive) heart failure: Secondary | ICD-10-CM | POA: Diagnosis present

## 2020-12-22 DIAGNOSIS — I110 Hypertensive heart disease with heart failure: Principal | ICD-10-CM | POA: Diagnosis present

## 2020-12-22 DIAGNOSIS — Z9049 Acquired absence of other specified parts of digestive tract: Secondary | ICD-10-CM

## 2020-12-22 DIAGNOSIS — I472 Ventricular tachycardia: Secondary | ICD-10-CM | POA: Diagnosis not present

## 2020-12-22 DIAGNOSIS — Z20822 Contact with and (suspected) exposure to covid-19: Secondary | ICD-10-CM | POA: Diagnosis present

## 2020-12-22 DIAGNOSIS — F0391 Unspecified dementia with behavioral disturbance: Secondary | ICD-10-CM | POA: Diagnosis present

## 2020-12-22 DIAGNOSIS — Z79899 Other long term (current) drug therapy: Secondary | ICD-10-CM

## 2020-12-22 DIAGNOSIS — E785 Hyperlipidemia, unspecified: Secondary | ICD-10-CM | POA: Diagnosis present

## 2020-12-22 DIAGNOSIS — Z7982 Long term (current) use of aspirin: Secondary | ICD-10-CM

## 2020-12-22 DIAGNOSIS — R296 Repeated falls: Secondary | ICD-10-CM | POA: Diagnosis present

## 2020-12-22 DIAGNOSIS — I4891 Unspecified atrial fibrillation: Secondary | ICD-10-CM | POA: Diagnosis present

## 2020-12-22 DIAGNOSIS — I340 Nonrheumatic mitral (valve) insufficiency: Secondary | ICD-10-CM | POA: Diagnosis present

## 2020-12-22 DIAGNOSIS — I161 Hypertensive emergency: Secondary | ICD-10-CM | POA: Diagnosis present

## 2020-12-22 DIAGNOSIS — F03918 Unspecified dementia, unspecified severity, with other behavioral disturbance: Secondary | ICD-10-CM | POA: Diagnosis present

## 2020-12-22 DIAGNOSIS — I509 Heart failure, unspecified: Secondary | ICD-10-CM

## 2020-12-22 DIAGNOSIS — I10 Essential (primary) hypertension: Secondary | ICD-10-CM | POA: Diagnosis present

## 2020-12-22 DIAGNOSIS — Z880 Allergy status to penicillin: Secondary | ICD-10-CM

## 2020-12-22 DIAGNOSIS — E876 Hypokalemia: Secondary | ICD-10-CM | POA: Diagnosis present

## 2020-12-22 DIAGNOSIS — I5043 Acute on chronic combined systolic (congestive) and diastolic (congestive) heart failure: Secondary | ICD-10-CM | POA: Diagnosis present

## 2020-12-22 DIAGNOSIS — I4892 Unspecified atrial flutter: Secondary | ICD-10-CM | POA: Diagnosis present

## 2020-12-22 DIAGNOSIS — I5020 Unspecified systolic (congestive) heart failure: Secondary | ICD-10-CM | POA: Diagnosis present

## 2020-12-22 DIAGNOSIS — Z8673 Personal history of transient ischemic attack (TIA), and cerebral infarction without residual deficits: Secondary | ICD-10-CM

## 2020-12-22 DIAGNOSIS — K219 Gastro-esophageal reflux disease without esophagitis: Secondary | ICD-10-CM | POA: Diagnosis present

## 2020-12-22 DIAGNOSIS — I2510 Atherosclerotic heart disease of native coronary artery without angina pectoris: Secondary | ICD-10-CM | POA: Diagnosis present

## 2020-12-22 NOTE — ED Provider Notes (Signed)
Emergency Medicine Provider Triage Evaluation Note  Craig [name] , a 80 y.o. male  was evaluated in triage.  Hx of hypertension, hyperlipidemia, dementia, CAD, and atrial flutter not on anticoagulation. Pt transported from [facility] for increased SOB. Symptoms reportedly present for a few weeks and he was started on Lasix without relief. No hx of chronic O2 requirement; requiring supplemental O2 to maintain sats above 90%. Currenly 95% on 2L via [place]. Given Duoneb in transport with EMS  Review of Systems  Positive: SOB Negative: Fever  Physical Exam  There were no vitals taken for this visit. Gen:   Awake, alert  Resp:  Normal effort. Faint rhonchi b/l bases MSK:   Moves extremities without difficulty  Other:  Pitting edema up to proximal thighs bilaterally. Heart with irregularly irregular rhythm, normal rate.  Medical Decision Making  Medically screening exam initiated at 11:59 PM.  Appropriate orders placed.  Craig [name] was informed that the remainder of the evaluation will be completed by another provider, this initial triage assessment does not replace that evaluation, and the importance of remaining in the ED until their evaluation is complete.  Acute resp failure w/hypoxia   Antonietta Breach, PA-C 12/23/20 0003    Ezequiel Essex, MD 12/23/20 [phone]

## 2020-12-22 NOTE — ED Triage Notes (Addendum)
Pt presents to ED BIB [facility] from [facility]. Per facility pt has been increasing Sob for a few weeks. Pt started on lasix recently w/o relief. EMS given duoneb and put pt on   88% on RA 2L 95% 154/110

## 2020-12-23 ENCOUNTER — Emergency Department (HOSPITAL_COMMUNITY): Payer: Medicare (Managed Care)

## 2020-12-23 ENCOUNTER — Observation Stay (HOSPITAL_BASED_OUTPATIENT_CLINIC_OR_DEPARTMENT_OTHER): Payer: Medicare (Managed Care)

## 2020-12-23 ENCOUNTER — Encounter (HOSPITAL_COMMUNITY): Payer: Self-pay | Admitting: Internal Medicine

## 2020-12-23 DIAGNOSIS — I5021 Acute systolic (congestive) heart failure: Secondary | ICD-10-CM

## 2020-12-23 DIAGNOSIS — I4892 Unspecified atrial flutter: Secondary | ICD-10-CM

## 2020-12-23 DIAGNOSIS — I5023 Acute on chronic systolic (congestive) heart failure: Secondary | ICD-10-CM

## 2020-12-23 DIAGNOSIS — F0281 Dementia in other diseases classified elsewhere with behavioral disturbance: Secondary | ICD-10-CM | POA: Diagnosis not present

## 2020-12-23 DIAGNOSIS — J9601 Acute respiratory failure with hypoxia: Secondary | ICD-10-CM | POA: Diagnosis not present

## 2020-12-23 DIAGNOSIS — I5020 Unspecified systolic (congestive) heart failure: Secondary | ICD-10-CM

## 2020-12-23 DIAGNOSIS — I10 Essential (primary) hypertension: Secondary | ICD-10-CM

## 2020-12-23 LAB — BASIC METABOLIC PANEL
Anion gap: 11 (ref 5–15)
BUN: 18 mg/dL (ref 8–23)
CO2: 23 mmol/L (ref 22–32)
Calcium: 9.2 mg/dL (ref 8.9–10.3)
Chloride: 108 mmol/L (ref 98–111)
Creatinine, Ser: 0.93 mg/dL (ref 0.61–1.24)
GFR, Estimated: >60 mL/min (ref >60)
Glucose, Bld: 136 mg/dL — ABNORMAL HIGH (ref 70–99)
Potassium: 4 mmol/L (ref 3.5–5.1)
Sodium: 142 mmol/L (ref 135–145)

## 2020-12-23 LAB — CBC WITH DIFFERENTIAL/PLATELET
Abs Immature Granulocytes: 0.02 10*3/uL (ref 0.00–0.07)
Basophils Absolute: 0 10*3/uL (ref 0.0–0.1)
Basophils Relative: 0 %
Eosinophils Absolute: 0.1 10*3/uL (ref 0.0–0.5)
Eosinophils Relative: 1 %
HCT: 43.2 % (ref 39.0–52.0)
Hemoglobin: 13.6 g/dL (ref 13.0–17.0)
Immature Granulocytes: 0 %
Lymphocytes Relative: 26 %
Lymphs Abs: 1.5 10*3/uL (ref 0.7–4.0)
MCH: 32.8 pg (ref 26.0–34.0)
MCHC: 31.5 g/dL (ref 30.0–36.0)
MCV: 104.1 fL — ABNORMAL HIGH (ref 80.0–100.0)
Monocytes Absolute: 0.3 10*3/uL (ref 0.1–1.0)
Monocytes Relative: 5 %
Neutro Abs: 4 10*3/uL (ref 1.7–7.7)
Neutrophils Relative %: 68 %
Platelets: 195 10*3/uL (ref 150–400)
RBC: 4.15 MIL/uL — ABNORMAL LOW (ref 4.22–5.81)
RDW: 16.3 % — ABNORMAL HIGH (ref 11.5–15.5)
WBC: 5.9 10*3/uL (ref 4.0–10.5)
nRBC: 0.5 % — ABNORMAL HIGH (ref 0.0–0.2)

## 2020-12-23 LAB — ECHOCARDIOGRAM COMPLETE
Area-P 1/2: 2.22 cm2
Calc EF: 28.7 %
MV M vel: 4.96 m/s
MV Peak grad: 98.4 mmHg
Radius: 0.35 cm
S' Lateral: 4.5 cm
Single Plane A2C EF: 29.1 %
Single Plane A4C EF: 23.4 %

## 2020-12-23 LAB — TROPONIN I (HIGH SENSITIVITY)
Troponin I (High Sensitivity): 40 ng/L — ABNORMAL HIGH (ref <18)
Troponin I (High Sensitivity): 41 ng/L — ABNORMAL HIGH (ref <18)

## 2020-12-23 LAB — BRAIN NATRIURETIC PEPTIDE: B Natriuretic Peptide: 3784.4 pg/mL — ABNORMAL HIGH (ref 0.0–100.0)

## 2020-12-23 LAB — RESP PANEL BY RT-PCR (FLU A&B, COVID) ARPGX2
Influenza A by PCR: NEGATIVE
Influenza B by PCR: NEGATIVE
SARS Coronavirus 2 by RT PCR: NEGATIVE

## 2020-12-23 MED ORDER — BUSPIRONE HCL 5 MG PO TABS
5.0000 mg | ORAL_TABLET | Freq: Three times a day (TID) | ORAL | Status: DC
  Administered 2020-12-23 – 2021-01-06 (×43): 5 mg via ORAL
  Filled 2020-12-23 (×43): qty 1

## 2020-12-23 MED ORDER — SIMETHICONE 80 MG PO CHEW: 80.0000 mg | CHEWABLE_TABLET | Freq: Four times a day (QID) | ORAL | Status: DC | PRN

## 2020-12-23 MED ORDER — ADULT MULTIVITAMIN W/MINERALS CH
1.0000 | ORAL_TABLET | Freq: Every day | ORAL | Status: DC
  Administered 2020-12-23 – 2021-01-06 (×15): 1 via ORAL
  Filled 2020-12-23 (×15): qty 1

## 2020-12-23 MED ORDER — METOPROLOL SUCCINATE ER 50 MG PO TB24
50.0000 mg | ORAL_TABLET | Freq: Every day | ORAL | Status: DC
  Administered 2020-12-23 – 2021-01-06 (×15): 50 mg via ORAL
  Filled 2020-12-23: qty 2
  Filled 2020-12-23 (×14): qty 1

## 2020-12-23 MED ORDER — SODIUM CHLORIDE 0.9 % IV SOLN: 250.0000 mL | INTRAVENOUS | Status: DC | PRN

## 2020-12-23 MED ORDER — ENOXAPARIN SODIUM 40 MG/0.4ML IJ SOSY
40.0000 mg | PREFILLED_SYRINGE | INTRAMUSCULAR | Status: DC
  Administered 2020-12-23 – 2021-01-06 (×15): 40 mg via SUBCUTANEOUS
  Filled 2020-12-23 (×15): qty 0.4

## 2020-12-23 MED ORDER — FUROSEMIDE 10 MG/ML IJ SOLN
40.0000 mg | Freq: Two times a day (BID) | INTRAMUSCULAR | Status: DC
  Administered 2020-12-23 – 2020-12-25 (×5): 40 mg via INTRAVENOUS
  Filled 2020-12-23 (×4): qty 4

## 2020-12-23 MED ORDER — FUROSEMIDE 10 MG/ML IJ SOLN
40.0000 mg | Freq: Once | INTRAMUSCULAR | Status: AC
  Administered 2020-12-23: 40 mg via INTRAVENOUS
  Filled 2020-12-23: qty 4

## 2020-12-23 MED ORDER — ACETAMINOPHEN 325 MG PO TABS
650.0000 mg | ORAL_TABLET | ORAL | Status: DC | PRN
  Administered 2020-12-27 – 2020-12-31 (×3): 650 mg via ORAL
  Filled 2020-12-23 (×4): qty 2

## 2020-12-23 MED ORDER — FUROSEMIDE 10 MG/ML IJ SOLN: 40.0000 mg | Freq: Two times a day (BID) | INTRAMUSCULAR | Status: DC

## 2020-12-23 MED ORDER — ONDANSETRON HCL 4 MG/2ML IJ SOLN: 4.0000 mg | Freq: Four times a day (QID) | INTRAMUSCULAR | Status: DC | PRN

## 2020-12-23 MED ORDER — PANTOPRAZOLE SODIUM 40 MG PO TBEC
40.0000 mg | DELAYED_RELEASE_TABLET | Freq: Every day | ORAL | Status: DC
  Administered 2020-12-23 – 2021-01-06 (×15): 40 mg via ORAL
  Filled 2020-12-23 (×15): qty 1

## 2020-12-23 MED ORDER — ASPIRIN EC 81 MG PO TBEC
81.0000 mg | DELAYED_RELEASE_TABLET | Freq: Every day | ORAL | Status: DC
  Administered 2020-12-23 – 2021-01-06 (×15): 81 mg via ORAL
  Filled 2020-12-23 (×15): qty 1

## 2020-12-23 MED ORDER — DONEPEZIL HCL 10 MG PO TABS
10.0000 mg | ORAL_TABLET | Freq: Every day | ORAL | Status: DC
  Administered 2020-12-23 – 2021-01-05 (×14): 10 mg via ORAL
  Filled 2020-12-23 (×15): qty 1

## 2020-12-23 MED ORDER — SODIUM CHLORIDE 0.9% FLUSH: 3.0000 mL | INTRAVENOUS | Status: DC | PRN

## 2020-12-23 MED ORDER — LOSARTAN POTASSIUM 25 MG PO TABS
25.0000 mg | ORAL_TABLET | Freq: Every day | ORAL | Status: DC
  Administered 2020-12-23 – 2021-01-06 (×15): 25 mg via ORAL
  Filled 2020-12-23 (×15): qty 1

## 2020-12-23 MED ORDER — SODIUM CHLORIDE 0.9% FLUSH
3.0000 mL | Freq: Two times a day (BID) | INTRAVENOUS | Status: DC
  Administered 2020-12-23 – 2021-01-01 (×16): 3 mL via INTRAVENOUS

## 2020-12-23 MED ORDER — ENSURE ENLIVE PO LIQD
237.0000 mL | Freq: Three times a day (TID) | ORAL | Status: DC
  Administered 2020-12-23 – 2021-01-06 (×37): 237 mL via ORAL
  Filled 2020-12-23 (×2): qty 237

## 2020-12-23 NOTE — ED Notes (Signed)
Pt took condom cath off again, pulled brief off and urinated all over self. Peri care provided, new condom cath placed. Pt educated on the use of condom cath and pt verbalized understanding.

## 2020-12-23 NOTE — ED Notes (Signed)
Pt resting/ sleeping, NAD, calm.

## 2020-12-23 NOTE — ED Notes (Signed)
Back from echo, admitting MD at [facility].

## 2020-12-23 NOTE — ED Notes (Signed)
meds crushed in apple sauce

## 2020-12-23 NOTE — H&P (Signed)
History and Physical    Craig [name] OIN:[id number] DOB: 20-Nov-1940 DOA: 12/22/2020  PCP: [name], MD  Patient coming from: [facility]  I have personally briefly reviewed patient's old medical records in [place]  Chief Complaint: SOB, hypoxia  HPI: Craig [name] is a 80 y.o. male with medical history significant of HFrEF (EF 25-30% as of Dec 21), A.Flutter rate controlled but not on chronic AC, dementia (quite advanced based on prior notes, DC summary Dec 21, etc), CAD.  Pt transported via EMS from [place] due to increased SOB.  Symptoms reported present for past few weeks.  Pt started on lasix without relief.  Associated BLE edema.  Pt not on chronic O2 but is satting 88% on RA today which is new.  Improved to 95% on 2L via [place].  Pt not able to contribute to history secondary to advanced dementia.   ED Course: CXR shows L perihilar and R basilar infiltrates.  BNP is very elevated at 3784.  Trop 40.  WBC nl, no SIRS.  COVID pending.  40mg  IV lasix ordered by EDP.   Review of Systems: Unable to perform secondary to advanced dementia. Past Medical History:  Diagnosis Date   Atrial flutter ([place])    Coronary artery disease    Hyperlipidemia    Hypertension    TIA (transient ischemic attack)     Past Surgical History:  Procedure Laterality Date   arm surgery Left    BOWEL RESECTION  05/27/2020   Procedure: SMALL BOWEL RESECTION;  Surgeon: Ronny Bacon, MD;  Location: [facility] ORS;  Service: General;;   HERNIA REPAIR     INGUINAL HERNIA REPAIR Right 05/27/2020   Procedure: HERNIA REPAIR INGUINAL INCARCERATED;  Surgeon: Ronny Bacon, MD;  Location: [facility];  Service: General;  Laterality: Right;   ORCHIECTOMY Right 05/27/2020   Procedure: ORCHIECTOMY;  Surgeon: Ronny Bacon, MD;  Location: [facility] ORS;  Service: General;  Laterality: Right;     reports that he has quit smoking. His smokeless tobacco use includes snuff. He  reports current alcohol use. He reports that he does not use drugs.  Allergies  Allergen Reactions   Penicillins     Family History  Family history unknown: Yes  Pt with advanced dementia, unable to provide family history.   Prior to Admission medications   Medication Sig Start Date End Date Taking? Authorizing Provider  aspirin EC 81 MG tablet Take 81 mg by mouth daily. Swallow whole.    [provider]  donepezil (ARICEPT) 10 MG tablet TK 1 T PO HS FOR MEMORY Patient not taking: No sig reported 12/18/17   [provider]  feeding supplement (ENSURE ENLIVE / ENSURE PLUS) LIQD Take 237 mLs by mouth 3 (three) times daily between meals. 04/26/20   Lorella Nimrod, MD  losartan (COZAAR) 25 MG tablet Take 1 tablet (25 mg total) by mouth daily. 06/15/20   Lorella Nimrod, MD  metoprolol succinate (TOPROL-XL) 25 MG 24 hr tablet Take 1 tablet (25 mg total) by mouth daily. 06/15/20   Lorella Nimrod, MD  Multiple Vitamin (MULTIVITAMIN WITH MINERALS) TABS tablet Take 1 tablet by mouth daily. 04/27/20   Lorella Nimrod, MD  pantoprazole (PROTONIX) 40 MG tablet Take 1 tablet (40 mg total) by mouth daily. 06/15/20   Lorella Nimrod, MD    Physical Exam: Vitals:   12/23/20 0111  BP: (!) 148/107  Pulse: 82  Resp: 16  Temp: 97.6 F (36.4 C)  TempSrc: Oral  SpO2: 98%    Constitutional: Resting comfortably, wakes up when RN performs COVID swab on patients nose.  Then falls back asleep. Eyes: PERRL, lids and conjunctivae normal ENMT: Mucous membranes are moist. Posterior pharynx clear of any exudate or lesions.Normal dentition.  Neck: normal, supple, no masses, no thyromegaly Respiratory: Mild crackles at B bases Cardiovascular: IRR, IRR, 3+ BLE edema.  Abdomen: no tenderness, no masses palpated. No hepatosplenomegaly. Bowel sounds positive.  Musculoskeletal: no clubbing / cyanosis. No joint deformity upper and lower extremities. Good ROM, no contractures. Normal muscle tone.  Skin: no  rashes, lesions, ulcers. No induration Neurologic: MAE, grossly non-focal Psychiatric: Resting comfortably, wakes up when RN performs COVID swab.  Has advanced dementia.   Labs on Admission: I have personally reviewed following labs and imaging studies  CBC: Recent Labs  Lab 12/22/20 2359  WBC 5.9  NEUTROABS 4.0  HGB 13.6  HCT 43.2  MCV 104.1*  PLT 694   Basic Metabolic Panel: Recent Labs  Lab 12/22/20 2359  NA 142  K 4.0  CL 108  CO2 23  GLUCOSE 136*  BUN 18  CREATININE 0.93  CALCIUM 9.2   GFR: CrCl cannot be calculated (Unknown ideal weight.). Liver Function Tests: No results for input(s): AST, ALT, ALKPHOS, BILITOT, PROT, ALBUMIN in the last 168 hours. No results for input(s): LIPASE, AMYLASE in the last 168 hours. No results for input(s): AMMONIA in the last 168 hours. Coagulation Profile: No results for input(s): INR, PROTIME in the last 168 hours. Cardiac Enzymes: No results for input(s): CKTOTAL, CKMB, CKMBINDEX, TROPONINI in the last 168 hours. BNP (last 3 results) No results for input(s): PROBNP in the last 8760 hours. HbA1C: No results for input(s): HGBA1C in the last 72 hours. CBG: No results for input(s): GLUCAP in the last 168 hours. Lipid Profile: No results for input(s): CHOL, HDL, LDLCALC, TRIG, CHOLHDL, LDLDIRECT in the last 72 hours. Thyroid Function Tests: No results for input(s): TSH, T4TOTAL, FREET4, T3FREE, THYROIDAB in the last 72 hours. Anemia Panel: No results for input(s): VITAMINB12, FOLATE, FERRITIN, TIBC, IRON, RETICCTPCT in the last 72 hours. Urine analysis:    Component Value Date/Time   COLORURINE YELLOW (A) 06/11/2020 1141   APPEARANCEUR CLOUDY (A) 06/11/2020 1141   LABSPEC 1.012 06/11/2020 1141   PHURINE 7.0 06/11/2020 1141   GLUCOSEU NEGATIVE 06/11/2020 1141   HGBUR NEGATIVE 06/11/2020 1141   BILIRUBINUR NEGATIVE 06/11/2020 1141   KETONESUR NEGATIVE 06/11/2020 1141   PROTEINUR NEGATIVE 06/11/2020 1141   NITRITE  NEGATIVE 06/11/2020 1141   LEUKOCYTESUR NEGATIVE 06/11/2020 1141    Radiological Exams on Admission: DG Chest Portable 1 View  Result Date: 12/23/2020 CLINICAL DATA:  Increasing shortness of breath.  Hypoxia. EXAM: PORTABLE CHEST 1 VIEW COMPARISON:  06/01/2020 FINDINGS: Cardiac enlargement. Left perihilar and right basilar airspace disease. This is most likely to represent multifocal pneumonia although asymmetrical edema could also have this appearance. No pleural effusions. No pneumothorax. Calcification of the aorta. IMPRESSION: Cardiac enlargement. Patchy left perihilar and right basilar infiltrates. Electronically Signed   By: Lucienne Capers M.D.   On: 12/23/2020 00:14    EKG: Independently reviewed. Rate controlled A.Flutter  Assessment/Plan Principal Problem:   Acute respiratory failure with hypoxia (HCC) Active Problems:   Essential hypertension   Atrial flutter (HCC)   Dementia with behavioral disturbance (HCC)   HFrEF (heart failure with reduced ejection fraction) (HCC)   Acute on chronic HFrEF (heart failure with reduced ejection fraction) (HCC)    Acute resp failure with hypoxia -  Likely due to acute on chronic HFrEF COVID pending to rule this out No SIRS, severe BNP elevation, peripheral edema: infectious PNA felt less likely but will watch for fever or other signs that he may have this. Acute on chronic HFrEF - CHF pathway Lasix 40mg  IV BID ordered Tele monitor 2d echo Cont ARB, BB Strict intake and output BMP daily A.Flutter - Cont BB rate control Not on chronic anticoagulation due to high fall risk Advanced dementia - Cont home meds HTN - Cont ARB, BB  DVT prophylaxis: Lovenox Code Status: Full Code per SNF documentation Family Communication: No family in room Disposition Plan: Back to SNF after admit Consults called: None Admission status: Place in obs    Courtney Fenlon, [place] Hospitalists  How to contact the [facility] Attending or Consulting  provider [place] or covering provider during after hours [place], for this patient?  Check the care team in [facility] and look for a) attending/consulting [facility] provider listed and b) the [facility] team listed Log into [email or web address]  Amion Physician Scheduling and messaging for groups and whole hospitals  On call and physician scheduling software for group practices, residents, hospitalists and other medical providers for call, clinic, rotation and shift schedules. On[employer] is a hospital-wide system for scheduling doctors and paging doctors on call. EasyPlot is for scientific plotting and data analysis.  [email or web address]  and use 's universal password to access. If you do not have the password, please contact the hospital operator.  Locate the [facility] provider you are looking for under [facility] and page to a number that you can be directly reached. If you still have difficulty reaching the provider, please page the [facility] (Director on Call) for the Hospitalists listed on amion for assistance.  12/23/2020, 2:27 AM

## 2020-12-23 NOTE — ED Notes (Signed)
Pt  eating breakfast. Taken to echo. Alert, NAD, calm, interactive, follows commands.

## 2020-12-23 NOTE — ED Provider Notes (Signed)
[facility] EMERGENCY DEPARTMENT Provider Note  CSN: [id number] Arrival date & time: 12/22/20 2344  Chief Complaint(s) Shortness of Breath  HPI Craig [name] is a 80 y.o. male with a past medical history listed below including dementia and CHF with a last EF of 25 to 30%'s noted on a echo from December 2021, on Lasix here for several weeks of shortness of breath. Patient reportedly does not wear any supplemental oxygen. He was started on Lasix a few weeks ago.  Remainder of history, ROS, and physical exam limited due to patient's condition (dementia).   Level V Caveat.    Shortness of Breath  Past Medical History Past Medical History:  Diagnosis Date   Atrial flutter ([place])    Coronary artery disease    Hyperlipidemia    Hypertension    TIA (transient ischemic attack)    Patient Active Problem List   Diagnosis Date Noted   Acute on chronic HFrEF (heart failure with reduced ejection fraction) ([place]) 12/23/2020   Acute respiratory failure with hypoxia ([place]) 12/23/2020   HFrEF (heart failure with reduced ejection fraction) (HCC)    Atrial flutter with rapid ventricular response ([place]) 05/26/2020   SBO (small bowel obstruction) ([place]) 05/26/2020   Hypernatremia 05/26/2020   Nicotine dependence 05/26/2020   Protein-calorie malnutrition, severe 04/22/2020   Dementia with behavioral disturbance ([place]) 04/21/2020   Altered mental status 04/20/2020   Atrial flutter (HCC)    Bradycardia    CAD (coronary artery disease), native coronary artery 12/25/2017   History of coronary artery stent placement 12/25/2017   Hyperlipidemia 12/25/2017   Former smoker 12/25/2017   Essential hypertension 12/25/2017   Memory loss 12/25/2017   Home Medication(s) Prior to Admission medications   Medication Sig Start Date End Date Taking? Authorizing Provider  acetaminophen (TYLENOL) 650 MG CR tablet Take 650 mg by mouth every 8 (eight) hours as needed for pain.   Yes  [provider]  aspirin EC 81 MG tablet Take 81 mg by mouth daily. Swallow whole.   Yes [provider]  busPIRone (BUSPAR) 5 MG tablet Take 5 mg by mouth 3 (three) times daily.   Yes [provider]  donepezil (ARICEPT) 10 MG tablet Take 10 mg by mouth at bedtime. 12/18/17  Yes [provider]  feeding supplement (ENSURE ENLIVE / ENSURE PLUS) LIQD Take 237 mLs by mouth 3 (three) times daily between meals. 04/26/20  Yes Lorella Nimrod, MD  furosemide (LASIX) 20 MG tablet Take 20 mg by mouth daily.   Yes [provider]  LORazepam (ATIVAN) 0.5 MG tablet Take 0.5 mg by mouth See admin instructions. May give 1 hour prior to shower as needed for combativeness   Yes [provider]  losartan (COZAAR) 25 MG tablet Take 1 tablet (25 mg total) by mouth daily. 06/15/20  Yes Lorella Nimrod, MD  metoprolol succinate (TOPROL-XL) 50 MG 24 hr tablet Take 50 mg by mouth daily. Take with or immediately following a meal.   Yes [provider]  Multiple Vitamin (MULTIVITAMIN WITH MINERALS) TABS tablet Take 1 tablet by mouth daily. 04/27/20  Yes Lorella Nimrod, MD  OVER THE COUNTER MEDICATION 1 application. Apply compression stockings in the morning and remove at bedtime   Yes [provider]  pantoprazole (PROTONIX) 40 MG tablet Take 1 tablet (40 mg total) by mouth daily. 06/15/20  Yes Lorella Nimrod, MD  simethicone (MYLICON) 80 MG chewable tablet Chew 80 mg by mouth every 6 (six) hours as needed  for flatulence.   Yes [provider]                                                                                                                                    Past Surgical History Past Surgical History:  Procedure Laterality Date   arm surgery Left    BOWEL RESECTION  05/27/2020   Procedure: SMALL BOWEL RESECTION;  Surgeon: Ronny Bacon, MD;  Location: [facility] ORS;  Service: General;;   HERNIA REPAIR     INGUINAL HERNIA REPAIR Right  05/27/2020   Procedure: HERNIA REPAIR INGUINAL INCARCERATED;  Surgeon: Ronny Bacon, MD;  Location: [facility] ORS;  Service: General;  Laterality: Right;   ORCHIECTOMY Right 05/27/2020   Procedure: ORCHIECTOMY;  Surgeon: Ronny Bacon, MD;  Location: [facility] ORS;  Service: General;  Laterality: Right;   Family History Family History  Family history unknown: Yes    Social History Social History   Tobacco Use   Smoking status: Former    Packs/day: 0.00    Pack years: 0.00    Types: Cigarettes   Smokeless tobacco: Current    Types: Snuff  Substance Use Topics   Alcohol use: Yes   Drug use: No   Allergies Penicillins  Review of Systems Review of Systems  Respiratory:  Positive for shortness of breath.   Unable to obtain due to dementia Physical Exam Vital Signs  I have reviewed the triage vital signs BP (!) 148/107 (BP Location: Left Arm)   Pulse 82   Temp 97.6 F (36.4 C) (Oral)   Resp 16   SpO2 98%   Physical Exam Vitals reviewed.  Constitutional:      General: He is not in acute distress.    Appearance: He is well-developed. He is not diaphoretic.  HENT:     Head: Normocephalic and atraumatic.     Nose: Nose normal.  Eyes:     General: No scleral icterus.       Right eye: No discharge.        Left eye: No discharge.     Conjunctiva/sclera: Conjunctivae normal.     Pupils: Pupils are equal, round, and reactive to light.  Cardiovascular:     Rate and Rhythm: Normal rate and regular rhythm.     Heart sounds: No murmur heard.   No friction rub. No gallop.  Pulmonary:     Effort: Pulmonary effort is normal. No respiratory distress.     Breath sounds: Normal breath sounds. No stridor. No rales.  Abdominal:     General: There is no distension.     Palpations: Abdomen is soft.     Tenderness: There is no abdominal tenderness.  Musculoskeletal:        General: No tenderness.     Cervical back: Normal range of motion and neck supple.     Right lower leg: 3+  Edema present.     Left lower leg:  3+ Edema present.  Skin:    General: Skin is warm and dry.     Findings: No erythema or rash.  Neurological:     Mental Status: He is alert. He is disoriented.    ED Results and Treatments Labs (all labs ordered are listed, but only abnormal results are displayed) Labs Reviewed  BRAIN NATRIURETIC PEPTIDE - Abnormal; Notable for the following components:      Result Value   B Natriuretic Peptide 3,784.4 (*)    All other components within normal limits  BASIC METABOLIC PANEL - Abnormal; Notable for the following components:   Glucose, Bld 136 (*)    All other components within normal limits  CBC WITH DIFFERENTIAL/PLATELET - Abnormal; Notable for the following components:   RBC 4.15 (*)    MCV 104.1 (*)    RDW 16.3 (*)    nRBC 0.5 (*)    All other components within normal limits  TROPONIN I (HIGH SENSITIVITY) - Abnormal; Notable for the following components:   Troponin I (High Sensitivity) 40 (*)    All other components within normal limits  TROPONIN I (HIGH SENSITIVITY) - Abnormal; Notable for the following components:   Troponin I (High Sensitivity) 41 (*)    All other components within normal limits  RESP PANEL BY RT-PCR (FLU A&B, COVID) [id number]                                                                                                                         EKG  EKG Interpretation  Date/Time:  Thursday December 23 2020 00:06:45 EDT Ventricular Rate:  92 PR Interval:    QRS Duration: 101 QT Interval:  398 QTC Calculation: 511 R Axis:   -37 Text Interpretation: Atrial flutter Left axis deviation Anterior infarct, old Prolonged QT interval Artifact in lead(s) I II III aVR aVL Interpretation limited secondary to artifact ST depressions improved Confirmed by Ezequiel Essex [phone]) on 12/23/2020 12:17:22 AM        Radiology DG Chest Portable 1 View  Result Date: 12/23/2020 CLINICAL DATA:  Increasing shortness of breath.  Hypoxia. EXAM:  PORTABLE CHEST 1 VIEW COMPARISON:  06/01/2020 FINDINGS: Cardiac enlargement. Left perihilar and right basilar airspace disease. This is most likely to represent multifocal pneumonia although asymmetrical edema could also have this appearance. No pleural effusions. No pneumothorax. Calcification of the aorta. IMPRESSION: Cardiac enlargement. Patchy left perihilar and right basilar infiltrates. Electronically Signed   By: Lucienne Capers M.D.   On: 12/23/2020 00:14    Pertinent labs & imaging results that were available during my care of the patient were reviewed by me and considered in my medical decision making (see chart for details).  Medications Ordered in ED Medications  sodium chloride flush (NS) 0.9 % injection 3 mL (3 mLs Intravenous Not Given 12/23/20 0239)  sodium chloride flush (NS) 0.9 % injection 3 mL (has no administration in time range)  0.9 %  sodium chloride infusion (has no administration in time  range)  acetaminophen (TYLENOL) tablet 650 mg (has no administration in time range)  ondansetron (ZOFRAN) injection 4 mg (has no administration in time range)  enoxaparin (LOVENOX) injection 40 mg (has no administration in time range)  furosemide (LASIX) injection 40 mg (has no administration in time range)  metoprolol succinate (TOPROL-XL) 24 hr tablet 50 mg (has no administration in time range)  aspirin EC tablet 81 mg (has no administration in time range)  busPIRone (BUSPAR) tablet 5 mg (has no administration in time range)  donepezil (ARICEPT) tablet 10 mg (has no administration in time range)  feeding supplement (ENSURE ENLIVE / ENSURE PLUS) liquid 237 mL (has no administration in time range)  losartan (COZAAR) tablet 25 mg (has no administration in time range)  pantoprazole (PROTONIX) EC tablet 40 mg (has no administration in time range)  simethicone (MYLICON) chewable tablet 80 mg (has no administration in time range)  multivitamin with minerals tablet 1 tablet (has no  administration in time range)  furosemide (LASIX) injection 40 mg (40 mg Intravenous Given 12/23/20 0245)                                                                                                                                    Procedures .Critical Care  Date/Time: 12/23/2020 3:48 AM Performed by: Fatima Blank, MD Authorized by: Fatima Blank, MD   Critical care provider statement:    Critical care time (minutes):  45   Critical care was necessary to treat or prevent imminent or life-threatening deterioration of the following conditions:  Cardiac failure and respiratory failure   Critical care was time spent personally by me on the following activities:  Discussions with consultants, evaluation of patient's response to treatment, examination of patient, ordering and performing treatments and interventions, ordering and review of laboratory studies, ordering and review of radiographic studies, pulse oximetry, re-evaluation of patient's condition, obtaining history from patient or surrogate and review of old charts   Care discussed with: admitting provider    (including critical care time)  Medical Decision Making / ED Course I have reviewed the nursing notes for this encounter and the patient's prior records (if available in EHR or on provided paperwork).   Mantaj Chamberlin was evaluated in Emergency Department on 12/23/2020 for the symptoms described in the history of present illness. He was evaluated in the context of the global COVID-19 pandemic, which necessitated consideration that the patient might be at risk for infection with the SARS-CoV-2 virus that causes COVID-19. Institutional protocols and algorithms that pertain to the evaluation of patients at risk for COVID-19 are in a state of rapid change based on information released by regulatory bodies including the CDC and federal and state organizations. These policies and algorithms were followed during the  patient's care in the ED.  Work-up is consistent with CHF exacerbation. Patient is requiring 3 L nasal cannula supplemental oxygen which is new. Chest x-ray  notable for bibasilar infiltrates likely edema.  No fevers or leukocytosis concerning for pneumonia at this time. Given a dose of IV Lasix. Admitted to medicine for further management      Final Clinical Impression(s) / ED Diagnoses Final diagnoses:  Acute on chronic systolic congestive heart failure ([place])      This chart was dictated using voice recognition software.  Despite best efforts to proofread,  errors can occur which can change the documentation meaning.    Fatima Blank, MD 12/23/20 [phone]

## 2020-12-23 NOTE — ED Notes (Signed)
SPO2 100% on RA, will continue to monitor. Remains on RA. Admitting aware. OSA while sleeping.

## 2020-12-23 NOTE — Progress Notes (Signed)
  Echocardiogram 2D Echocardiogram has been performed.  Craig [name] 12/23/2020, 10:12 AM

## 2020-12-23 NOTE — Progress Notes (Signed)
PROGRESS NOTE    Craig [name]  WGY:[id number] DOB: 07-04-1940 DOA: 12/22/2020 PCP: [name], MD    Brief Narrative:  Craig [name] is a 80 year old male with past medical history significant for chronic systolic congestive heart failure (EF 25-30% 05/2020), atrial flutter not on anticoagulation, dementia, CAD who presented to [facility] via EMS from [facility] due to progressive shortness of breath and lower extremity edema.  Patient unable to contribute to history secondary to his advanced dementia.  Apparently symptoms present for several weeks and was started on Lasix without relief.  In the ED, temperature 97.6 F, HR 82, RR 16, BP 148/107, SPO2 90% on 3 L nasal cannula. (Was reported to be 88% on room air on arrival.)  Sodium 142, potassium 4.0, chloride 108, CO2 23, glucose 136, BUN 18, creatinine 0.93.  WBC 5.9, hemoglobin 13.6, platelets 195.  BNP 3784.  High sensitive troponin 40>41.  SARS-CoV-2/influenza A/B PCR negative.  EKG with atrial flutter, rate 92, QTc 511.  Chest x-ray with cardiac enlargement, patchy left perihilar/right basilar infiltrates versus asymmetrical edema. Patient given 40 mg IV Lasix by EDP.  TRH consulted for further evaluation and management of acute on chronic combined systolic/diastolic congestive heart failure.  Assessment & Plan:   Principal Problem:   Acute respiratory failure with hypoxia (HCC) Active Problems:   Essential hypertension   Atrial flutter (HCC)   Dementia with behavioral disturbance (HCC)   HFrEF (heart failure with reduced ejection fraction) (HCC)   Acute on chronic HFrEF (heart failure with reduced ejection fraction) (HCC)   Acute respiratory failure with hypoxia, POA Acute on chronic combined systolic/diastolic congestive heart failure Patient presenting to ED from SNF with progressive shortness of breath and lower extremity edema over the past few weeks; despite initiation of oral Lasix outpatient.  Patient was  hypoxic with SPO2 88% on room air at arrival with elevated BNP and imaging studies suggestive of pulmonary vascular congestion. --Furosemide 40 mg IV every 12 hours --Metoprolol succinate 50 mg p.o. daily --Losartan 25 mg p.o. daily --Strict I's and O's and daily weights --Continue supplemental oxygen, maintain SPO2 greater than 92%. --BMP daily  Essential hypertension BP 149/93. --Metoprolol succinate 50 mg p.o. daily --Losartan 25 mg p.o. daily --Aspirin 81 mg p.o. daily  Atrial flutter Not on anticoagulation due to dementia and frequent falls. --Continue rate control with metoprolol succinate 50 mg p.o. daily --Monitor on telemetry  Dementia --Donepezil 10 mg p.o. nightly --BuSpar 5 mg p.o. 3 times daily  Prolonged QTC EKG on admission with QTC 511. --Avoid QT prolonging medications --Monitor on telemetry  GERD: Continue PPI   DVT prophylaxis: enoxaparin (LOVENOX) injection 40 mg Start: 12/23/20 1000   Code Status: Full Code Family Communication: No family present at bedside  Disposition Plan:  Level of care: Telemetry Cardiac Status is: Observation  The patient remains OBS appropriate and will d/c before 2 midnights.  Dispo: The patient is from: SNF              Anticipated d/c is to: SNF              Patient currently is not medically stable to d/c.   Difficult to place patient No   Consultants:  None  Procedures:  TTE  Antimicrobials:  None   Subjective: Patient seen examined at bedside, resting comfortably.  Continues in ED holding area, hallway.  Pleasantly confused.  No family present.  Unable to obtain any further ROS from patient due  to his underlying dementia.  Although no specific complaints.  Denies headache, no chest pain, no palpitations, no abdominal pain.  No acute concerns overnight per nursing staff.  Objective: Vitals:   12/23/20 0634 12/23/20 1038 12/23/20 1530 12/23/20 1614  BP: (!) 149/93 140/89 (!) 135/99   Pulse: 86 98 76    Resp: 15 17 11    Temp:   97.8 F (36.6 C) 97.7 F (36.5 C)  TempSrc:   Oral Oral  SpO2: 100% 100% 100%     Intake/Output Summary (Last 24 hours) at 12/23/2020 1650 Last data filed at 12/23/2020 1500 Gross per 24 hour  Intake --  Output 2610 ml  Net -2610 ml   There were no vitals filed for this visit.  Examination:  General exam: Appears calm and comfortable, pleasantly confused Respiratory system: Decreased breath sounds bilateral bases with crackles, normal respiratory effort without accessory muscle use, on 4 L nasal cannula with SPO2 100% Cardiovascular system: S1 & S2 heard, RRR. No JVD, murmurs, rubs, gallops or clicks.  3+ pitting pedal edema to knees Gastrointestinal system: Abdomen is nondistended, soft and nontender. No organomegaly or masses felt. Normal bowel sounds heard. Central nervous system: Alert, not oriented to person/place/time or situation. No focal neurological deficits. Extremities: Symmetric 5 x 5 power. Skin: No rashes, lesions or ulcers Psychiatry: Judgement and insight appear poor. Mood & affect appropriate.     Data Reviewed: I have personally reviewed following labs and imaging studies  CBC: Recent Labs  Lab 12/22/20 2359  WBC 5.9  NEUTROABS 4.0  HGB 13.6  HCT 43.2  MCV 104.1*  PLT 939   Basic Metabolic Panel: Recent Labs  Lab 12/22/20 2359  NA 142  K 4.0  CL 108  CO2 23  GLUCOSE 136*  BUN 18  CREATININE 0.93  CALCIUM 9.2   GFR: CrCl cannot be calculated (Unknown ideal weight.). Liver Function Tests: No results for input(s): AST, ALT, ALKPHOS, BILITOT, PROT, ALBUMIN in the last 168 hours. No results for input(s): LIPASE, AMYLASE in the last 168 hours. No results for input(s): AMMONIA in the last 168 hours. Coagulation Profile: No results for input(s): INR, PROTIME in the last 168 hours. Cardiac Enzymes: No results for input(s): CKTOTAL, CKMB, CKMBINDEX, TROPONINI in the last 168 hours. BNP (last 3 results) No results for  input(s): PROBNP in the last 8760 hours. HbA1C: No results for input(s): HGBA1C in the last 72 hours. CBG: No results for input(s): GLUCAP in the last 168 hours. Lipid Profile: No results for input(s): CHOL, HDL, LDLCALC, TRIG, CHOLHDL, LDLDIRECT in the last 72 hours. Thyroid Function Tests: No results for input(s): TSH, T4TOTAL, FREET4, T3FREE, THYROIDAB in the last 72 hours. Anemia Panel: No results for input(s): VITAMINB12, FOLATE, FERRITIN, TIBC, IRON, RETICCTPCT in the last 72 hours. Sepsis Labs: No results for input(s): PROCALCITON, LATICACIDVEN in the last 168 hours.  Recent Results (from the past 240 hour(s))  Resp Panel by RT-PCR (Flu A&B, Covid) Nasopharyngeal Swab     Status: None   Collection Time: 12/23/20 12:04 AM   Specimen: Nasopharyngeal Swab; Nasopharyngeal(NP) swabs in vial transport medium  Result Value Ref Range Status   SARS Coronavirus 2 by RT PCR NEGATIVE NEGATIVE Final    Comment: (NOTE) SARS-CoV-2 target nucleic acids are NOT DETECTED.  The SARS-CoV-2 RNA is generally detectable in upper respiratory specimens during the acute phase of infection. The lowest concentration of SARS-CoV-2 viral copies this assay can detect is 138 copies/mL. A negative result does not preclude SARS-Cov-2  infection and should not be used as the sole basis for treatment or other patient management decisions. A negative result may occur with  improper specimen collection/handling, submission of specimen other than nasopharyngeal swab, presence of viral mutation(s) within the areas targeted by this assay, and inadequate number of viral copies(<138 copies/mL). A negative result must be combined with clinical observations, patient history, and epidemiological information. The expected result is Negative.  Fact Sheet for Patients:  EntrepreneurPulse.com.au  Fact Sheet for Healthcare Providers:  IncredibleEmployment.be  This test is no t yet  approved or cleared by the [place] FDA and  has been authorized for detection and/or diagnosis of SARS-CoV-2 by FDA under an Emergency Use Authorization (EUA). This EUA will remain  in effect (meaning this test can be used) for the duration of the COVID-19 declaration under Section 564(b)(1) of the Act, 21 U.S.C.section 360bbb-3(b)(1), unless the authorization is terminated  or revoked sooner.       Influenza A by PCR NEGATIVE NEGATIVE Final   Influenza B by PCR NEGATIVE NEGATIVE Final    Comment: (NOTE) The Xpert Xpress SARS-CoV-2/FLU/RSV plus assay is intended as an aid in the diagnosis of influenza from Nasopharyngeal swab specimens and should not be used as a sole basis for treatment. Nasal washings and aspirates are unacceptable for Xpert Xpress SARS-CoV-2/FLU/RSV testing.  Fact Sheet for Patients: EntrepreneurPulse.com.au  Fact Sheet for Healthcare Providers: IncredibleEmployment.be  This test is not yet approved or cleared by the [place] FDA and has been authorized for detection and/or diagnosis of SARS-CoV-2 by FDA under an Emergency Use Authorization (EUA). This EUA will remain in effect (meaning this test can be used) for the duration of the COVID-19 declaration under Section 564(b)(1) of the Act, 21 U.S.C. section 360bbb-3(b)(1), unless the authorization is terminated or revoked.  Performed at [facility], [place] [address] [place], [place] [address]          Radiology Studies: DG Chest Portable 1 View  Result Date: 12/23/2020 CLINICAL DATA:  Increasing shortness of breath.  Hypoxia. EXAM: PORTABLE CHEST 1 VIEW COMPARISON:  06/01/2020 FINDINGS: Cardiac enlargement. Left perihilar and right basilar airspace disease. This is most likely to represent multifocal pneumonia although asymmetrical edema could also have this appearance. No pleural effusions. No pneumothorax. Calcification of the aorta. IMPRESSION:  Cardiac enlargement. Patchy left perihilar and right basilar infiltrates. Electronically Signed   By: Lucienne Capers M.D.   On: 12/23/2020 00:14   ECHOCARDIOGRAM COMPLETE  Result Date: 12/23/2020    ECHOCARDIOGRAM REPORT   Patient Name:   TAYSEN BUSHART Katich Date of Exam: 12/23/2020 Medical Rec #:  [id number]            Height:       74.0 in Accession #:    [id number]           Weight:       162.3 lb Date of Birth:  1940/10/20             BSA:          1.989 m Patient Age:    31 years             BP:           149/93 mmHg Patient Gender: M                    HR:           92 bpm. Exam Location:  Inpatient Procedure: 2D Echo, Cardiac  Doppler and Color Doppler Indications:    CHF-Acute Systolic U20.25  History:        Patient has prior history of Echocardiogram examinations, most                 recent 06/01/2020. CAD, TIA, Arrythmias:Atrial Fibrillation;                 Risk Factors:Dyslipidemia and Hypertension.  Sonographer:    Bernadene Person RDCS Referring Phys: [phone] JARED M GARDNER  Sonographer Comments: Image acquisition challenging due to respiratory motion. IMPRESSIONS  1. Left ventricular ejection fraction, by estimation, is <20%. The left ventricle has severely decreased function. The left ventricle demonstrates global hypokinesis. There is mild left ventricular hypertrophy. Left ventricular diastolic parameters are indeterminate.  2. Right ventricular systolic function is moderately reduced. The right ventricular size is normal. There is mildly elevated pulmonary artery systolic pressure. The estimated right ventricular systolic pressure is 62.3 mmHg.  3. Left atrial size was severely dilated.  4. Right atrial size was moderately dilated.  5. The mitral valve is normal in structure. Moderate probably functional mitral valve regurgitation. No evidence of mitral stenosis.  6. The aortic valve is tricuspid. Aortic valve regurgitation is not visualized. No aortic stenosis is present.  7. Aortic dilatation  noted. There is mild dilatation of the ascending aorta, measuring 37 mm.  8. The inferior vena cava is dilated in size with <50% respiratory variability, suggesting right atrial pressure of 15 mmHg.  9. The patient was in atrial fibrillation. FINDINGS  Left Ventricle: Left ventricular ejection fraction, by estimation, is <20%. The left ventricle has severely decreased function. The left ventricle demonstrates global hypokinesis. The left ventricular internal cavity size was normal in size. There is mild left ventricular hypertrophy. Left ventricular diastolic parameters are indeterminate. Right Ventricle: The right ventricular size is normal. No increase in right ventricular wall thickness. Right ventricular systolic function is moderately reduced. There is mildly elevated pulmonary artery systolic pressure. The tricuspid regurgitant velocity is 2.54 m/s, and with an assumed right atrial pressure of 15 mmHg, the estimated right ventricular systolic pressure is 76.2 mmHg. Left Atrium: Left atrial size was severely dilated. Right Atrium: Right atrial size was moderately dilated. Pericardium: Trivial pericardial effusion is present. Mitral Valve: The mitral valve is normal in structure. Moderate mitral valve regurgitation. No evidence of mitral valve stenosis. Tricuspid Valve: The tricuspid valve is normal in structure. Tricuspid valve regurgitation is trivial. Aortic Valve: The aortic valve is tricuspid. Aortic valve regurgitation is not visualized. No aortic stenosis is present. Pulmonic Valve: The pulmonic valve was normal in structure. Pulmonic valve regurgitation is trivial. Aorta: The aortic root is normal in size and structure and aortic dilatation noted. There is mild dilatation of the ascending aorta, measuring 37 mm. Venous: The inferior vena cava is dilated in size with less than 50% respiratory variability, suggesting right atrial pressure of 15 mmHg. IAS/Shunts: No atrial level shunt detected by color flow  Doppler.  LEFT VENTRICLE PLAX 2D LVIDd:         4.90 cm      Diastology LVIDs:         4.50 cm      LV e' medial:    3.94 cm/s LV PW:         1.30 cm      LV E/e' medial:  16.0 LV IVS:        1.30 cm      LV e' lateral:   8.89  cm/s LVOT diam:     2.30 cm      LV E/e' lateral: 7.1 LV SV:         39 LV SV Index:   20 LVOT Area:     4.15 cm  LV Volumes (MOD) LV vol d, MOD A2C: 199.0 ml LV vol d, MOD A4C: 154.0 ml LV vol s, MOD A2C: 141.0 ml LV vol s, MOD A4C: 118.0 ml LV SV MOD A2C:     58.0 ml LV SV MOD A4C:     154.0 ml LV SV MOD BP:      54.1 ml RIGHT VENTRICLE RV S prime:     5.25 cm/s TAPSE (M-mode): 0.9 cm LEFT ATRIUM              Index       RIGHT ATRIUM           Index LA diam:        4.70 cm  2.36 cm/m  RA Area:     25.80 cm LA Vol (A2C):   154.0 ml 77.43 ml/m RA Volume:   75.90 ml  38.16 ml/m LA Vol (A4C):   133.0 ml 66.87 ml/m LA Biplane Vol: 150.0 ml 75.42 ml/m  AORTIC VALVE LVOT Vmax:   63.93 cm/s LVOT Vmean:  39.367 cm/s LVOT VTI:    0.094 m  AORTA Ao Root diam: 3.70 cm Ao Asc diam:  3.70 cm MITRAL VALVE                 TRICUSPID VALVE MV Area (PHT): 2.22 cm      TR Peak grad:   25.8 mmHg MV Decel Time: 342 msec      TR Vmax:        254.00 cm/s MR Peak grad:    98.4 mmHg MR Mean grad:    67.0 mmHg   SHUNTS MR Vmax:         496.00 cm/s Systemic VTI:  0.09 m MR Vmean:        388.0 cm/s  Systemic Diam: 2.30 cm MR PISA:         0.77 cm MR PISA Eff ROA: 6 mm MR PISA Radius:  0.35 cm MV E velocity: 62.90 cm/s MV A velocity: 27.45 cm/s MV E/A ratio:  2.29 Loralie Champagne MD Electronically signed by Loralie Champagne MD Signature Date/Time: 12/23/2020/4:03:19 PM    Final         Scheduled Meds:  aspirin EC  81 mg Oral Daily   busPIRone  5 mg Oral TID   donepezil  10 mg Oral QHS   enoxaparin (LOVENOX) injection  40 mg Subcutaneous Q24H   feeding supplement  237 mL Oral TID BM   furosemide  40 mg Intravenous BID   losartan  25 mg Oral Daily   metoprolol succinate  50 mg Oral Daily   multivitamin  with minerals  1 tablet Oral Daily   pantoprazole  40 mg Oral Daily   sodium chloride flush  3 mL Intravenous Q12H   Continuous Infusions:  sodium chloride       LOS: 0 days    Time spent: 42 minutes spent on chart review, discussion with nursing staff, consultants, updating family and interview/physical exam; more than 50% of that time was spent in counseling and/or coordination of care.    Barnie Sopko J [place] DO [facility] Available via Epic secure chat 7am-7pm After these hours, please refer to coverage provider listed on [email or web address] 12/23/2020, 4:50  PM

## 2020-12-24 DIAGNOSIS — I4892 Unspecified atrial flutter: Secondary | ICD-10-CM | POA: Diagnosis present

## 2020-12-24 DIAGNOSIS — Z880 Allergy status to penicillin: Secondary | ICD-10-CM | POA: Diagnosis not present

## 2020-12-24 DIAGNOSIS — F17220 Nicotine dependence, chewing tobacco, uncomplicated: Secondary | ICD-10-CM | POA: Diagnosis present

## 2020-12-24 DIAGNOSIS — Z9079 Acquired absence of other genital organ(s): Secondary | ICD-10-CM | POA: Diagnosis not present

## 2020-12-24 DIAGNOSIS — I509 Heart failure, unspecified: Secondary | ICD-10-CM

## 2020-12-24 DIAGNOSIS — I5023 Acute on chronic systolic (congestive) heart failure: Secondary | ICD-10-CM | POA: Diagnosis present

## 2020-12-24 DIAGNOSIS — K219 Gastro-esophageal reflux disease without esophagitis: Secondary | ICD-10-CM | POA: Diagnosis present

## 2020-12-24 DIAGNOSIS — E876 Hypokalemia: Secondary | ICD-10-CM | POA: Diagnosis present

## 2020-12-24 DIAGNOSIS — I4891 Unspecified atrial fibrillation: Secondary | ICD-10-CM | POA: Diagnosis present

## 2020-12-24 DIAGNOSIS — I161 Hypertensive emergency: Secondary | ICD-10-CM | POA: Diagnosis present

## 2020-12-24 DIAGNOSIS — I5043 Acute on chronic combined systolic (congestive) and diastolic (congestive) heart failure: Secondary | ICD-10-CM | POA: Diagnosis present

## 2020-12-24 DIAGNOSIS — I10 Essential (primary) hypertension: Secondary | ICD-10-CM | POA: Diagnosis not present

## 2020-12-24 DIAGNOSIS — Z8673 Personal history of transient ischemic attack (TIA), and cerebral infarction without residual deficits: Secondary | ICD-10-CM | POA: Diagnosis not present

## 2020-12-24 DIAGNOSIS — J9601 Acute respiratory failure with hypoxia: Secondary | ICD-10-CM | POA: Diagnosis present

## 2020-12-24 DIAGNOSIS — F0281 Dementia in other diseases classified elsewhere with behavioral disturbance: Secondary | ICD-10-CM | POA: Diagnosis not present

## 2020-12-24 DIAGNOSIS — I110 Hypertensive heart disease with heart failure: Secondary | ICD-10-CM | POA: Diagnosis present

## 2020-12-24 DIAGNOSIS — Z79899 Other long term (current) drug therapy: Secondary | ICD-10-CM | POA: Diagnosis not present

## 2020-12-24 DIAGNOSIS — F0391 Unspecified dementia with behavioral disturbance: Secondary | ICD-10-CM | POA: Diagnosis present

## 2020-12-24 DIAGNOSIS — E785 Hyperlipidemia, unspecified: Secondary | ICD-10-CM | POA: Diagnosis present

## 2020-12-24 DIAGNOSIS — I2510 Atherosclerotic heart disease of native coronary artery without angina pectoris: Secondary | ICD-10-CM | POA: Diagnosis present

## 2020-12-24 DIAGNOSIS — Z7189 Other specified counseling: Secondary | ICD-10-CM | POA: Diagnosis not present

## 2020-12-24 DIAGNOSIS — I340 Nonrheumatic mitral (valve) insufficiency: Secondary | ICD-10-CM | POA: Diagnosis present

## 2020-12-24 DIAGNOSIS — I5020 Unspecified systolic (congestive) heart failure: Secondary | ICD-10-CM | POA: Diagnosis not present

## 2020-12-24 DIAGNOSIS — Z7982 Long term (current) use of aspirin: Secondary | ICD-10-CM | POA: Diagnosis not present

## 2020-12-24 DIAGNOSIS — Z9049 Acquired absence of other specified parts of digestive tract: Secondary | ICD-10-CM | POA: Diagnosis not present

## 2020-12-24 DIAGNOSIS — Z20822 Contact with and (suspected) exposure to covid-19: Secondary | ICD-10-CM | POA: Diagnosis present

## 2020-12-24 DIAGNOSIS — I472 Ventricular tachycardia: Secondary | ICD-10-CM | POA: Diagnosis not present

## 2020-12-24 DIAGNOSIS — Z955 Presence of coronary angioplasty implant and graft: Secondary | ICD-10-CM | POA: Diagnosis not present

## 2020-12-24 DIAGNOSIS — R296 Repeated falls: Secondary | ICD-10-CM | POA: Diagnosis present

## 2020-12-24 LAB — BASIC METABOLIC PANEL
Anion gap: 10 (ref 5–15)
BUN: 25 mg/dL — ABNORMAL HIGH (ref 8–23)
CO2: 23 mmol/L (ref 22–32)
Calcium: 9.5 mg/dL (ref 8.9–10.3)
Chloride: 108 mmol/L (ref 98–111)
Creatinine, Ser: 1.04 mg/dL (ref 0.61–1.24)
GFR, Estimated: >60 mL/min (ref >60)
Glucose, Bld: 141 mg/dL — ABNORMAL HIGH (ref 70–99)
Potassium: 4.7 mmol/L (ref 3.5–5.1)
Sodium: 141 mmol/L (ref 135–145)

## 2020-12-24 LAB — MAGNESIUM: Magnesium: 2.2 mg/dL (ref 1.7–2.4)

## 2020-12-24 NOTE — Evaluation (Signed)
Occupational Therapy Evaluation Patient Details Name: Craig [name] MRN: [id number] DOB: 12/24/1940 Today's Date: 12/24/2020    History of Present Illness 80 yo male with onset ofSOB and LE edema was sent from SNF to receive care.  Pt was noted to be in acute resp failure, and CHF acutely.  PMHx:  atrial flutter, CAD, HTN, TIA, CHF, dementia, EF 25-30%   Clinical Impression   Pt admitted with the above diagnoses and presents with below problem list. Pt will benefit from continued acute OT to address the below listed deficits and maximize independence with basic ADLs prior to d/c back to SNF. Unclear what pt's baseline is with ADLs and functional transfers/mobility as pt is unable to provide history and no family present on eval. Pt able to feed self during session with setup and assist to initiate task. Currently needs +2 assist to safely attempt transfer. Pt able to come into long-sitting position with light physical assist/tactile cueing. Per chart review, pt is from SNF. Hopefully pt is able to d/c back to same.     Follow Up Recommendations  SNF    Equipment Recommendations  Other (comment) (defer to next venue)    Recommendations for Other Services       Precautions / Restrictions Precautions Precautions: Fall Precaution Comments: dementia Restrictions Weight Bearing Restrictions: No      Mobility Bed Mobility Overal bed mobility: Needs Assistance Bed Mobility: Supine to Sit;Sit to Supine     Supine to sit: Mod assist Sit to supine: Max assist   General bed mobility comments: with cueing, pt able to come into long sitting position with little physical assist. Multimodal cues to initiate. Pt pulling on therapist arm to power trunk forward.    Transfers Overall transfer level: Needs assistance Equipment used: Rolling walker (2 wheeled);1 person hand held assist Transfers: Sit to/from Stand Sit to Stand: Min assist;Mod assist         General transfer comment:  will need +2 assist to safely attempt    Balance Overall balance assessment: Needs assistance Sitting-balance support: Feet supported;Bilateral upper extremity supported Sitting balance-Leahy Scale: Fair Sitting balance - Comments: per PT notes. Pt eating lunch during OT eval Postural control: Posterior lean Standing balance support: Bilateral upper extremity supported;During functional activity Standing balance-Leahy Scale: Poor Standing balance comment: pt stood up then sat with no warning, unable to get consistent follow through per PT note                           ADL either performed or assessed with clinical judgement   ADL Overall ADL's : Needs assistance/impaired Eating/Feeding: Set up;Supervision/ safety;Bed level Eating/Feeding Details (indicate cue type and reason): multimodal cues to initiate. Once intitated pt able to feed self with setup provided. Grooming: Moderate assistance   Upper Body Bathing: Moderate assistance   Lower Body Bathing: Moderate assistance;+2 for physical assistance;+2 for safety/equipment   Upper Body Dressing : Moderate assistance   Lower Body Dressing: Moderate assistance;+2 for physical assistance;+2 for safety/equipment                 General ADL Comments: Pt currently needs +2 assist to safely attempt transfers per clinical judgement and conversation with PT. Decreased inititation.     Vision         Perception     Praxis      Pertinent Vitals/Pain Pain Assessment: No/denies pain     Hand Dominance Right   Extremity/Trunk  Assessment Upper Extremity Assessment Upper Extremity Assessment: Overall WFL for tasks assessed;Generalized weakness   Lower Extremity Assessment Lower Extremity Assessment: Defer to PT evaluation   Cervical / Trunk Assessment Cervical / Trunk Assessment: Kyphotic (mild)   Communication Communication Communication: No difficulties (short reponses, tangential)   Cognition  Arousal/Alertness: Awake/alert Behavior During Therapy: Impulsive Overall Cognitive Status: History of cognitive impairments - at baseline Area of Impairment: Problem solving;Awareness;Safety/judgement;Following commands;Memory;Attention;Orientation                 Orientation Level: Place;Time;Situation Current Attention Level: Selective Memory: Decreased recall of precautions;Decreased short-term memory Following Commands: Follows one step commands inconsistently;Follows one step commands with increased time Safety/Judgement: Decreased awareness of safety;Decreased awareness of deficits Awareness: Intellectual Problem Solving: Slow processing;Requires verbal cues;Requires tactile cues General Comments: Dementia at baseline. Pt laying in bed upon OT arrival; leaning to left and seemed unaware of position. Cues and min guard to reposition trunk. Cues needed to initiate eating tasks. pleasantly confused   General Comments  pt sits with lack of awarness of his limits, does not control well and cannot demonstrate a sidestep on side of bed    Exercises     Shoulder Instructions      Home Living Family/patient expects to be discharged to:: Skilled nursing facility                                 Additional Comments: pt cannot recall the name of SNF, reports he is living in his own home      Prior Functioning/Environment Level of Independence: Needs assistance  Gait / Transfers Assistance Needed: pt is unable to give this history ADL's / Homemaking Assistance Needed: in SNF setting where help is available            OT Problem List: Decreased strength;Decreased activity tolerance;Impaired balance (sitting and/or standing);Decreased cognition;Decreased safety awareness;Decreased knowledge of use of DME or AE;Decreased knowledge of precautions      OT Treatment/Interventions: Self-care/ADL training;Therapeutic exercise;DME and/or AE instruction;Therapeutic  activities;Cognitive remediation/compensation;Patient/family education;Balance training    OT Goals(Current goals can be found in the care plan section) Acute Rehab OT Goals Patient Stated Goal: To go home OT Goal Formulation: Patient unable to participate in goal setting Time For Goal Achievement: 01/07/21 Potential to Achieve Goals: Fair ADL Goals Pt Will Perform Grooming: with min assist;sitting Pt Will Perform Lower Body Bathing: with min assist;sit to/from stand Pt Will Perform Lower Body Dressing: with min assist;sit to/from stand Pt Will Transfer to Toilet: with min assist;stand pivot transfer;with +2 assist;bedside commode;ambulating Pt Will Perform Toileting - Clothing Manipulation and hygiene: sit to/from stand;with mod assist  OT Frequency: Min 2X/week   Barriers to D/C:            Co-evaluation              AM-PAC OT "6 Clicks" Daily Activity     Outcome Measure Help from another person eating meals?: A Little Help from another person taking care of personal grooming?: A Little Help from another person toileting, which includes using toliet, bedpan, or urinal?: A Lot Help from another person bathing (including washing, rinsing, drying)?: A Lot Help from another person to put on and taking off regular upper body clothing?: A Lot Help from another person to put on and taking off regular lower body clothing?: A Lot 6 Click Score: 14   End of Session    Activity Tolerance:  Patient limited by fatigue Patient left: in bed;with call bell/phone within reach;with bed alarm set  OT Visit Diagnosis: Unsteadiness on feet (R26.81);Muscle weakness (generalized) (M62.81)                Time: 4103-0131 OT Time Calculation (min): 18 min Charges:  OT General Charges $OT Visit: 1 Visit OT Evaluation $OT Eval Low Complexity: [place], OT Acute Rehabilitation Services Pager: [phone] Office: [phone]   Hortencia Pilar 12/24/2020, 2:02 PM

## 2020-12-24 NOTE — Progress Notes (Signed)
PROGRESS NOTE    Craig [name]  VWP:[id number] DOB: [birth year] DOA: 12/22/2020 PCP: [name], MD    Brief Narrative:  Craig [name] is a 80 year old male with past medical history significant for chronic systolic congestive heart failure (EF 25-30% 05/2020), atrial flutter not on anticoagulation, dementia, CAD who presented to [facility] ED via EMS from [facility] SNF due to progressive shortness of breath and lower extremity edema.  Patient unable to contribute to history secondary to his advanced dementia.  Apparently symptoms present for several weeks and was started on Lasix without relief.  In the ED, temperature 97.6 F, HR 82, RR 16, BP 148/107, SPO2 90% on 3 L nasal cannula. (Was reported to be 88% on room air on arrival.)  Sodium 142, potassium 4.0, chloride 108, CO2 23, glucose 136, BUN 18, creatinine 0.93.  WBC 5.9, hemoglobin 13.6, platelets 195.  BNP 3784.  High sensitive troponin 40>41.  SARS-CoV-2/influenza A/B PCR negative.  EKG with atrial flutter, rate 92, QTc 511.  Chest x-ray with cardiac enlargement, patchy left perihilar/right basilar infiltrates versus asymmetrical edema. Patient given 40 mg IV Lasix by EDP.  TRH consulted for further evaluation and management of acute on chronic combined systolic/diastolic congestive heart failure.  Assessment & Plan:   Principal Problem:   Acute respiratory failure with hypoxia (HCC) Active Problems:   Essential hypertension   Atrial flutter (HCC)   Dementia with behavioral disturbance (HCC)   HFrEF (heart failure with reduced ejection fraction) (HCC)   Acute on chronic HFrEF (heart failure with reduced ejection fraction) (HCC)   Acute on chronic heart failure (HCC)   Acute respiratory failure with hypoxia, POA Acute on chronic combined systolic/diastolic congestive heart failure Patient presenting to ED from SNF with progressive shortness of breath and lower extremity edema over the past few weeks; despite  initiation of oral Lasix outpatient.  Patient was hypoxic with SPO2 88% on room air at arrival with elevated BNP and imaging studies suggestive of pulmonary vascular congestion.  TTE with LVEF less than 20%, LV severely decreased function, LV global hypokinesis, mild LVH, LA severely dilated, RA moderately dilated, aortic dilation 37 mm, IVC dilated. --Net negative 3.9 L past 24 hours --Furosemide 40 mg IV q12h --Metoprolol succinate 50 mg p.o. daily --Losartan 25 mg p.o. daily --Strict I's and O's and daily weights --Continue supplemental oxygen, maintain SPO2 greater than 92%. --BMP daily  Essential hypertension BP 122/89 this am. --Metoprolol succinate 50 mg p.o. daily --Losartan 25 mg p.o. daily --Aspirin 81 mg p.o. daily  Atrial flutter Not on anticoagulation due to dementia and frequent falls. --Continue rate control with metoprolol succinate 50 mg p.o. daily --Monitor on telemetry  Dementia --Donepezil 10 mg p.o. nightly --BuSpar 5 mg p.o. 3 times daily  Prolonged QTC EKG on admission with QTC 511. --Avoid QT prolonging medications --Monitor on telemetry  GERD: Continue PPI  Ethics: Patient presenting from SNF with acute on chronic congestive heart failure with worsening EF and significant peripheral edema.  History of dementia.  Consulting palliative care for assistance with goals of care and medical decision making as he is likely ending end-stage heart failure.   DVT prophylaxis: enoxaparin (LOVENOX) injection 40 mg Start: 12/23/20 1000   Code Status: Full Code Family Communication: No family present at bedside  Disposition Plan:  Level of care: Telemetry Cardiac Status is: Inpatient  Remains inpatient appropriate because:IV treatments appropriate due to intensity of illness or inability to take PO and Inpatient level of care appropriate  due to severity of illness  Dispo: The patient is from: SNF              Anticipated d/c is to: SNF              Patient  currently is not medically stable to d/c.   Difficult to place patient No  Consultants:  None  Procedures:  TTE  Antimicrobials:  None   Subjective: Patient seen examined at bedside, resting comfortably.  Pleasantly confused.  No specific complaints this morning.  Continues with good urine output with IV Lasix.  Denies headache, no chest pain, no palpitations, no abdominal pain.  No acute concerns overnight per nursing staff.  Objective: Vitals:   12/24/20 0302 12/24/20 0400 12/24/20 0700 12/24/20 1114  BP: (!) 147/100 (!) 156/96 (!) 148/96 122/89  Pulse: 88  62 65  Resp: 17  18 18   Temp: 97.6 F (36.4 C)  97.7 F (36.5 C) 97.7 F (36.5 C)  TempSrc: Oral  Oral Oral  SpO2: 97%  100% 96%  Weight: 85.5 kg     Height:        Intake/Output Summary (Last 24 hours) at 12/24/2020 1256 Last data filed at 12/24/2020 1038 Gross per 24 hour  Intake 480 ml  Output 3735 ml  Net -3255 ml   Filed Weights   12/23/20 1638 12/24/20 0302  Weight: 79.3 kg 85.5 kg    Examination:  General exam: Appears calm and comfortable, pleasantly confused Respiratory system: Decreased breath sounds bilateral bases with crackles, normal respiratory effort without accessory muscle use, on room air with SPO2 97%. Cardiovascular system: S1 & S2 heard, RRR. No JVD, murmurs, rubs, gallops or clicks.  3+ pitting pedal edema to knees Gastrointestinal system: Abdomen is nondistended, soft and nontender. No organomegaly or masses felt. Normal bowel sounds heard. Central nervous system: Alert, not oriented to person/place/time or situation. No focal neurological deficits. Extremities: Symmetric 5 x 5 power. Skin: No rashes, lesions or ulcers Psychiatry: Judgement and insight appear poor. Mood & affect appropriate.     Data Reviewed: I have personally reviewed following labs and imaging studies  CBC: Recent Labs  Lab 12/22/20 2359  WBC 5.9  NEUTROABS 4.0  HGB 13.6  HCT 43.2  MCV 104.1*  PLT 774    Basic Metabolic Panel: Recent Labs  Lab 12/22/20 2359 12/24/20 0507  NA 142 141  K 4.0 4.7  CL 108 108  CO2 23 23  GLUCOSE 136* 141*  BUN 18 25*  CREATININE 0.93 1.04  CALCIUM 9.2 9.5  MG  --  2.2   GFR: Estimated Creatinine Clearance: 68.1 mL/min (by C-G formula based on SCr of 1.04 mg/dL). Liver Function Tests: No results for input(s): AST, ALT, ALKPHOS, BILITOT, PROT, ALBUMIN in the last 168 hours. No results for input(s): LIPASE, AMYLASE in the last 168 hours. No results for input(s): AMMONIA in the last 168 hours. Coagulation Profile: No results for input(s): INR, PROTIME in the last 168 hours. Cardiac Enzymes: No results for input(s): CKTOTAL, CKMB, CKMBINDEX, TROPONINI in the last 168 hours. BNP (last 3 results) No results for input(s): PROBNP in the last 8760 hours. HbA1C: No results for input(s): HGBA1C in the last 72 hours. CBG: No results for input(s): GLUCAP in the last 168 hours. Lipid Profile: No results for input(s): CHOL, HDL, LDLCALC, TRIG, CHOLHDL, LDLDIRECT in the last 72 hours. Thyroid Function Tests: No results for input(s): TSH, T4TOTAL, FREET4, T3FREE, THYROIDAB in the last 72 hours. Anemia Panel: No  results for input(s): VITAMINB12, FOLATE, FERRITIN, TIBC, IRON, RETICCTPCT in the last 72 hours. Sepsis Labs: No results for input(s): PROCALCITON, LATICACIDVEN in the last 168 hours.  Recent Results (from the past 240 hour(s))  Resp Panel by RT-PCR (Flu A&B, Covid) Nasopharyngeal Swab     Status: None   Collection Time: 12/23/20 12:04 AM   Specimen: Nasopharyngeal Swab; Nasopharyngeal(NP) swabs in vial transport medium  Result Value Ref Range Status   SARS Coronavirus 2 by RT PCR NEGATIVE NEGATIVE Final    Comment: (NOTE) SARS-CoV-2 target nucleic acids are NOT DETECTED.  The SARS-CoV-2 RNA is generally detectable in upper respiratory specimens during the acute phase of infection. The lowest concentration of SARS-CoV-2 viral copies this assay  can detect is 138 copies/mL. A negative result does not preclude SARS-Cov-2 infection and should not be used as the sole basis for treatment or other patient management decisions. A negative result may occur with  improper specimen collection/handling, submission of specimen other than nasopharyngeal swab, presence of viral mutation(s) within the areas targeted by this assay, and inadequate number of viral copies(<138 copies/mL). A negative result must be combined with clinical observations, patient history, and epidemiological information. The expected result is Negative.  Fact Sheet for Patients:  EntrepreneurPulse.com.au  Fact Sheet for Healthcare Providers:  IncredibleEmployment.be  This test is no t yet approved or cleared by the [place] FDA and  has been authorized for detection and/or diagnosis of SARS-CoV-2 by FDA under an Emergency Use Authorization (EUA). This EUA will remain  in effect (meaning this test can be used) for the duration of the COVID-19 declaration under Section 564(b)(1) of the Act, 21 U.S.C.section 360bbb-3(b)(1), unless the authorization is terminated  or revoked sooner.       Influenza A by PCR NEGATIVE NEGATIVE Final   Influenza B by PCR NEGATIVE NEGATIVE Final    Comment: (NOTE) The Xpert Xpress SARS-CoV-2/FLU/RSV plus assay is intended as an aid in the diagnosis of influenza from Nasopharyngeal swab specimens and should not be used as a sole basis for treatment. Nasal washings and aspirates are unacceptable for Xpert Xpress SARS-CoV-2/FLU/RSV testing.  Fact Sheet for Patients: EntrepreneurPulse.com.au  Fact Sheet for Healthcare Providers: IncredibleEmployment.be  This test is not yet approved or cleared by the [place] FDA and has been authorized for detection and/or diagnosis of SARS-CoV-2 by FDA under an Emergency Use Authorization (EUA). This EUA will  remain in effect (meaning this test can be used) for the duration of the COVID-19 declaration under Section 564(b)(1) of the Act, 21 U.S.C. section 360bbb-3(b)(1), unless the authorization is terminated or revoked.  Performed at [facility], [place] [address], [place], [place] [address]          Radiology Studies: DG Chest Portable 1 View  Result Date: 12/23/2020 CLINICAL DATA:  Increasing shortness of breath.  Hypoxia. EXAM: PORTABLE CHEST 1 VIEW COMPARISON:  06/01/2020 FINDINGS: Cardiac enlargement. Left perihilar and right basilar airspace disease. This is most likely to represent multifocal pneumonia although asymmetrical edema could also have this appearance. No pleural effusions. No pneumothorax. Calcification of the aorta. IMPRESSION: Cardiac enlargement. Patchy left perihilar and right basilar infiltrates. Electronically Signed   By: Lucienne Capers M.D.   On: 12/23/2020 00:14   ECHOCARDIOGRAM COMPLETE  Result Date: 12/23/2020    ECHOCARDIOGRAM REPORT   Patient Name:   OMARION MINNEHAN Date of Exam: 12/23/2020 Medical Rec #:  [id number]            Height:  74.0 in Accession #:    [id number]           Weight:       162.3 lb Date of Birth:  1940/08/23             BSA:          1.989 m Patient Age:    63 years             BP:           149/93 mmHg Patient Gender: M                    HR:           92 bpm. Exam Location:  Inpatient Procedure: 2D Echo, Cardiac Doppler and Color Doppler Indications:    CHF-Acute Systolic L89.21  History:        Patient has prior history of Echocardiogram examinations, most                 recent 06/01/2020. CAD, TIA, Arrythmias:Atrial Fibrillation;                 Risk Factors:Dyslipidemia and Hypertension.  Sonographer:    Bernadene Person RDCS Referring Phys: [phone] JARED M GARDNER  Sonographer Comments: Image acquisition challenging due to respiratory motion. IMPRESSIONS  1. Left ventricular ejection fraction, by estimation, is <20%. The left ventricle  has severely decreased function. The left ventricle demonstrates global hypokinesis. There is mild left ventricular hypertrophy. Left ventricular diastolic parameters are indeterminate.  2. Right ventricular systolic function is moderately reduced. The right ventricular size is normal. There is mildly elevated pulmonary artery systolic pressure. The estimated right ventricular systolic pressure is 74.0 mmHg.  3. Left atrial size was severely dilated.  4. Right atrial size was moderately dilated.  5. The mitral valve is normal in structure. Moderate probably functional mitral valve regurgitation. No evidence of mitral stenosis.  6. The aortic valve is tricuspid. Aortic valve regurgitation is not visualized. No aortic stenosis is present.  7. Aortic dilatation noted. There is mild dilatation of the ascending aorta, measuring 37 mm.  8. The inferior vena cava is dilated in size with <50% respiratory variability, suggesting right atrial pressure of 15 mmHg.  9. The patient was in atrial fibrillation. FINDINGS  Left Ventricle: Left ventricular ejection fraction, by estimation, is <20%. The left ventricle has severely decreased function. The left ventricle demonstrates global hypokinesis. The left ventricular internal cavity size was normal in size. There is mild left ventricular hypertrophy. Left ventricular diastolic parameters are indeterminate. Right Ventricle: The right ventricular size is normal. No increase in right ventricular wall thickness. Right ventricular systolic function is moderately reduced. There is mildly elevated pulmonary artery systolic pressure. The tricuspid regurgitant velocity is 2.54 m/s, and with an assumed right atrial pressure of 15 mmHg, the estimated right ventricular systolic pressure is 81.4 mmHg. Left Atrium: Left atrial size was severely dilated. Right Atrium: Right atrial size was moderately dilated. Pericardium: Trivial pericardial effusion is present. Mitral Valve: The mitral valve  is normal in structure. Moderate mitral valve regurgitation. No evidence of mitral valve stenosis. Tricuspid Valve: The tricuspid valve is normal in structure. Tricuspid valve regurgitation is trivial. Aortic Valve: The aortic valve is tricuspid. Aortic valve regurgitation is not visualized. No aortic stenosis is present. Pulmonic Valve: The pulmonic valve was normal in structure. Pulmonic valve regurgitation is trivial. Aorta: The aortic root is normal in size and structure and aortic dilatation noted. There is  mild dilatation of the ascending aorta, measuring 37 mm. Venous: The inferior vena cava is dilated in size with less than 50% respiratory variability, suggesting right atrial pressure of 15 mmHg. IAS/Shunts: No atrial level shunt detected by color flow Doppler.  LEFT VENTRICLE PLAX 2D LVIDd:         4.90 cm      Diastology LVIDs:         4.50 cm      LV e' medial:    3.94 cm/s LV PW:         1.30 cm      LV E/e' medial:  16.0 LV IVS:        1.30 cm      LV e' lateral:   8.89 cm/s LVOT diam:     2.30 cm      LV E/e' lateral: 7.1 LV SV:         39 LV SV Index:   20 LVOT Area:     4.15 cm  LV Volumes (MOD) LV vol d, MOD A2C: 199.0 ml LV vol d, MOD A4C: 154.0 ml LV vol s, MOD A2C: 141.0 ml LV vol s, MOD A4C: 118.0 ml LV SV MOD A2C:     58.0 ml LV SV MOD A4C:     154.0 ml LV SV MOD BP:      54.1 ml RIGHT VENTRICLE RV S prime:     5.25 cm/s TAPSE (M-mode): 0.9 cm LEFT ATRIUM              Index       RIGHT ATRIUM           Index LA diam:        4.70 cm  2.36 cm/m  RA Area:     25.80 cm LA Vol (A2C):   154.0 ml 77.43 ml/m RA Volume:   75.90 ml  38.16 ml/m LA Vol (A4C):   133.0 ml 66.87 ml/m LA Biplane Vol: 150.0 ml 75.42 ml/m  AORTIC VALVE LVOT Vmax:   63.93 cm/s LVOT Vmean:  39.367 cm/s LVOT VTI:    0.094 m  AORTA Ao Root diam: 3.70 cm Ao Asc diam:  3.70 cm MITRAL VALVE                 TRICUSPID VALVE MV Area (PHT): 2.22 cm      TR Peak grad:   25.8 mmHg MV Decel Time: 342 msec      TR Vmax:        254.00  cm/s MR Peak grad:    98.4 mmHg MR Mean grad:    67.0 mmHg   SHUNTS MR Vmax:         496.00 cm/s Systemic VTI:  0.09 m MR Vmean:        388.0 cm/s  Systemic Diam: 2.30 cm MR PISA:         0.77 cm MR PISA Eff ROA: 6 mm MR PISA Radius:  0.35 cm MV E velocity: 62.90 cm/s MV A velocity: 27.45 cm/s MV E/A ratio:  2.29 Loralie Champagne MD Electronically signed by Loralie Champagne MD Signature Date/Time: 12/23/2020/4:03:19 PM    Final         Scheduled Meds:  aspirin EC  81 mg Oral Daily   busPIRone  5 mg Oral TID   donepezil  10 mg Oral QHS   enoxaparin (LOVENOX) injection  40 mg Subcutaneous Q24H   feeding supplement  237 mL Oral TID BM  furosemide  40 mg Intravenous BID   losartan  25 mg Oral Daily   metoprolol succinate  50 mg Oral Daily   multivitamin with minerals  1 tablet Oral Daily   pantoprazole  40 mg Oral Daily   sodium chloride flush  3 mL Intravenous Q12H   Continuous Infusions:  sodium chloride       LOS: 0 days    Time spent: 42 minutes spent on chart review, discussion with nursing staff, consultants, updating family and interview/physical exam; more than 50% of that time was spent in counseling and/or coordination of care.    Romyn Boswell J [place] DO [facility] Available via Epic secure chat 7am-7pm After these hours, please refer to coverage provider listed on [email or web address] 12/24/2020, 12:56 PM

## 2020-12-24 NOTE — Progress Notes (Signed)
Heart Failure Navigator Progress Note  Assessed for Heart & Vascular TOC clinic readiness.  Unfortunately at this time the patient does not meet criteria due to advanced dementia.   Navigator available for reassessment of patient.   Kerby Nora, PharmD, BCPS Heart Failure Stewardship Pharmacist Phone [phone]

## 2020-12-24 NOTE — Plan of Care (Signed)

## 2020-12-24 NOTE — Evaluation (Signed)
Physical Therapy Evaluation Patient Details Name: Craig [name] MRN: [id number] DOB: Feb 04, 1941 Today's Date: 12/24/2020   History of Present Illness  80 yo male with onset ofSOB and LE edema was sent from SNF to receive care.  Pt was noted to be in acute resp failure, and CHF acutely.  PMHx:  atrial flutter, CAD, HTN, TIA, CHF, dementia, EF 25-30%  Clinical Impression  Pt was seen for initiation of mobility and could not get farther than standing before pt would sit without warning.  He is from SNF, and is expected to continue to need this care level.  Will work toward a short gait bout to use with transfers but it is not clear what his recent PLOF was.  Follow along with him to get stronger, more stable in standing and prepare for safer transfers with staff in SNF.      Follow Up Recommendations SNF    Equipment Recommendations  None recommended by PT    Recommendations for Other Services       Precautions / Restrictions Precautions Precautions: Fall Precaution Comments: dementia Restrictions Weight Bearing Restrictions: No      Mobility  Bed Mobility Overal bed mobility: Needs Assistance Bed Mobility: Supine to Sit;Sit to Supine     Supine to sit: Mod assist Sit to supine: Max assist   General bed mobility comments: help to pick up trunk to sit and max to return to bed with heavy LE assist    Transfers Overall transfer level: Needs assistance Equipment used: Rolling walker (2 wheeled);1 person hand held assist Transfers: Sit to/from Stand Sit to Stand: Min assist;Mod assist         General transfer comment: mod briefly to support start of standing then min to complete  Ambulation/Gait             General Gait Details: unable to take even a sidestep  Stairs            Wheelchair Mobility    Modified Rankin (Stroke Patients Only)       Balance Overall balance assessment: Needs assistance Sitting-balance support: Feet supported;Bilateral  upper extremity supported Sitting balance-Leahy Scale: Fair   Postural control: Posterior lean Standing balance support: Bilateral upper extremity supported;During functional activity Standing balance-Leahy Scale: Poor Standing balance comment: pt stood up then sat with no warning, unable to get consistent follow through                             Pertinent Vitals/Pain Pain Assessment: No/denies pain    Home Living Family/patient expects to be discharged to:: Skilled nursing facility                 Additional Comments: pt cannot recall the name of SNF, reports he is living in his own home    Prior Function Level of Independence: Needs assistance   Gait / Transfers Assistance Needed: pt is unable to give this history  ADL's / Homemaking Assistance Needed: in SNF setting where help is available        Hand Dominance   Dominant Hand: Right    Extremity/Trunk Assessment   Upper Extremity Assessment Upper Extremity Assessment: Overall WFL for tasks assessed    Lower Extremity Assessment Lower Extremity Assessment: Generalized weakness    Cervical / Trunk Assessment Cervical / Trunk Assessment: Kyphotic (mild)  Communication   Communication: No difficulties  Cognition Arousal/Alertness: Awake/alert Behavior During Therapy: Impulsive Overall Cognitive Status: History  of cognitive impairments - at baseline Area of Impairment: Problem solving;Awareness;Safety/judgement;Following commands;Memory;Attention;Orientation                 Orientation Level: Place;Time;Situation Current Attention Level: Selective Memory: Decreased recall of precautions;Decreased short-term memory Following Commands: Follows one step commands inconsistently;Follows one step commands with increased time Safety/Judgement: Decreased awareness of safety;Decreased awareness of deficits Awareness: Intellectual Problem Solving: Slow processing;Requires verbal cues;Requires  tactile cues General Comments: pt is unable to follow through on all commands regarding transfers, tends to sit in uncontrolled way despite cues      General Comments General comments (skin integrity, edema, etc.): pt sits with lack of awarness of his limits, does not control well and cannot demonstrate a sidestep on side of bed    Exercises     Assessment/Plan    PT Assessment Patient needs continued PT services  PT Problem List Decreased strength;Decreased range of motion;Decreased activity tolerance;Decreased balance;Decreased mobility;Decreased coordination;Decreased cognition;Decreased knowledge of use of DME;Decreased safety awareness       PT Treatment Interventions DME instruction;Functional mobility training;Therapeutic activities;Therapeutic exercise;Balance training;Neuromuscular re-education;Patient/family education    PT Goals (Current goals can be found in the Care Plan section)  Acute Rehab PT Goals Patient Stated Goal: To go home PT Goal Formulation: With patient Time For Goal Achievement: 01/07/21 Potential to Achieve Goals: Fair    Frequency Min 3X/week   Barriers to discharge   living in SNF and will have accessible home with help to move    Co-evaluation               AM-PAC PT "6 Clicks" Mobility  Outcome Measure Help needed turning from your back to your side while in a flat bed without using bedrails?: A Lot Help needed moving from lying on your back to sitting on the side of a flat bed without using bedrails?: A Lot Help needed moving to and from a bed to a chair (including a wheelchair)?: A Lot Help needed standing up from a chair using your arms (e.g., wheelchair or bedside chair)?: A Lot Help needed to walk in hospital room?: Total Help needed climbing 3-5 steps with a railing? : Total 6 Click Score: 10    End of Session Equipment Utilized During Treatment: Gait belt Activity Tolerance: Patient limited by fatigue;Treatment limited  secondary to medical complications (Comment) (confusion) Patient left: in bed;with call bell/phone within reach;with bed alarm set Nurse Communication: Mobility status PT Visit Diagnosis: Unsteadiness on feet (R26.81);Muscle weakness (generalized) (M62.81);Other abnormalities of gait and mobility (R26.89);Difficulty in walking, not elsewhere classified (R26.2)    Time: 1044-1100 PT Time Calculation (min) (ACUTE ONLY): 16 min   Charges:   PT Evaluation $PT Eval Moderate Complexity: 1 Mod         Ramond Dial 12/24/2020, 12:21 PM Mee Hives, PT MS Acute Rehab Dept. Number: [place] and [place]

## 2020-12-24 NOTE — Social Work (Signed)
1355: CSW following, pt from [place] [facility]/ not medically ready for DC.

## 2020-12-25 DIAGNOSIS — J9601 Acute respiratory failure with hypoxia: Secondary | ICD-10-CM | POA: Diagnosis not present

## 2020-12-25 LAB — BASIC METABOLIC PANEL
Anion gap: 6 (ref 5–15)
BUN: 24 mg/dL — ABNORMAL HIGH (ref 8–23)
CO2: 27 mmol/L (ref 22–32)
Calcium: 9.2 mg/dL (ref 8.9–10.3)
Chloride: 106 mmol/L (ref 98–111)
Creatinine, Ser: 0.92 mg/dL (ref 0.61–1.24)
GFR, Estimated: >60 mL/min (ref >60)
Glucose, Bld: 112 mg/dL — ABNORMAL HIGH (ref 70–99)
Potassium: 4.1 mmol/L (ref 3.5–5.1)
Sodium: 139 mmol/L (ref 135–145)

## 2020-12-25 LAB — BRAIN NATRIURETIC PEPTIDE: B Natriuretic Peptide: 3508.4 pg/mL — ABNORMAL HIGH (ref 0.0–100.0)

## 2020-12-25 LAB — MAGNESIUM: Magnesium: 2.1 mg/dL (ref 1.7–2.4)

## 2020-12-25 MED ORDER — SPIRONOLACTONE 25 MG PO TABS
25.0000 mg | ORAL_TABLET | Freq: Every day | ORAL | Status: DC
  Administered 2020-12-25 – 2021-01-06 (×13): 25 mg via ORAL
  Filled 2020-12-25 (×13): qty 1

## 2020-12-25 MED ORDER — FUROSEMIDE 10 MG/ML IJ SOLN
40.0000 mg | Freq: Three times a day (TID) | INTRAMUSCULAR | Status: DC
  Administered 2020-12-25 – 2020-12-28 (×9): 40 mg via INTRAVENOUS
  Filled 2020-12-25 (×9): qty 4

## 2020-12-25 NOTE — Progress Notes (Deleted)
MD notified of 12 beat run of vtach. Verbal order received to place stat bmet and mag. Results pending.

## 2020-12-25 NOTE — Progress Notes (Signed)
PROGRESS NOTE    [name]  QIO:[id number] DOB: May 23, 1941 DOA: 12/22/2020 PCP: [name], MD    Brief Narrative:  Craig [name] is a 80 year old male with past medical history significant for chronic systolic congestive heart failure (EF 25-30% 05/2020), atrial flutter not on anticoagulation, dementia, CAD who presented to [facility] ED via EMS from [facility] due to progressive shortness of breath and lower extremity edema.  Patient unable to contribute to history secondary to his advanced dementia.  Apparently symptoms present for several weeks and was started on Lasix without relief.  In the ED, temperature 97.6 F, HR 82, RR 16, BP 148/107, SPO2 90% on 3 L nasal cannula. (Was reported to be 88% on room air on arrival.)  Sodium 142, potassium 4.0, chloride 108, CO2 23, glucose 136, BUN 18, creatinine 0.93.  WBC 5.9, hemoglobin 13.6, platelets 195.  BNP 3784.  High sensitive troponin 40>41.  SARS-CoV-2/influenza A/B PCR negative.  EKG with atrial flutter, rate 92, QTc 511.  Chest x-ray with cardiac enlargement, patchy left perihilar/right basilar infiltrates versus asymmetrical edema. Patient given 40 mg IV Lasix by EDP.  TRH consulted for further evaluation and management of acute on chronic combined systolic/diastolic congestive heart failure.  Assessment & Plan:   Principal Problem:   Acute respiratory failure with hypoxia (HCC) Active Problems:   Essential hypertension   Atrial flutter (HCC)   Dementia with behavioral disturbance (HCC)   HFrEF (heart failure with reduced ejection fraction) (HCC)   Acute on chronic HFrEF (heart failure with reduced ejection fraction) (HCC)   Acute on chronic heart failure (HCC)   Acute respiratory failure with hypoxia, POA Acute on chronic combined systolic/diastolic congestive heart failure Patient presenting to ED from SNF with progressive shortness of breath and lower extremity edema over the past few weeks; despite  initiation of oral Lasix outpatient.  Patient was hypoxic with SPO2 88% on room air at arrival with elevated BNP and imaging studies suggestive of pulmonary vascular congestion.  TTE with LVEF less than 20%, LV severely decreased function, LV global hypokinesis, mild LVH, LA severely dilated, RA moderately dilated, aortic dilation 37 mm, IVC dilated. --Net negative 2.4L past 24 hours and net negative 6.2L since admission --Increase furosemide to 40 mg IV q8h --start spironolactone 25mg  PO daily --Metoprolol succinate 50 mg p.o. daily --Losartan 25 mg p.o. daily --Place TED hose --Strict I's and O's and daily weights --Continue supplemental oxygen, maintain SPO2 greater than 92%; now titrated off --BMP daily  Essential hypertension BP 154/87 this am. --Starting spironolactone 25 mg p.o. daily today --Metoprolol succinate 50 mg p.o. daily --Losartan 25 mg p.o. daily --Aspirin 81 mg p.o. daily --Continue monitor BP closely  Atrial flutter Not on anticoagulation due to dementia and frequent falls. --Continue rate control with metoprolol succinate 50 mg p.o. daily --Monitor on telemetry  Dementia --Donepezil 10 mg p.o. nightly --BuSpar 5 mg p.o. 3 times daily  Prolonged QTC EKG on admission with QTC 511. --Avoid QT prolonging medications --Monitor on telemetry  GERD: Continue PPI  Ethics: Patient presenting from SNF with acute on chronic congestive heart failure with worsening EF and significant peripheral edema.  History of dementia.  Consulting palliative care for assistance with goals of care and medical decision making as he is likely ending end-stage heart failure.   DVT prophylaxis: Place TED hose Start: 12/25/20 0910 enoxaparin (LOVENOX) injection 40 mg Start: 12/23/20 1000   Code Status: Full Code Family Communication: No family present at bedside  Disposition Plan:  Level of care: Telemetry Cardiac Status is: Inpatient  Remains inpatient appropriate because:IV  treatments appropriate due to intensity of illness or inability to take PO and Inpatient level of care appropriate due to severity of illness  Dispo: The patient is from: SNF              Anticipated d/c is to: SNF              Patient currently is not medically stable to d/c.   Difficult to place patient No  Consultants:  None  Procedures:  TTE  Antimicrobials:  None   Subjective: Patient seen examined at bedside, resting comfortably.  Pleasantly confused.  No specific complaints this morning.  Continues with good urine output with IV Lasix.  Denies headache, no chest pain, no palpitations, no abdominal pain.  No acute concerns overnight per nursing staff.  Objective: Vitals:   12/25/20 0058 12/25/20 0500 12/25/20 0512 12/25/20 0734  BP: (!) 141/87  (!) 154/87 123/78  Pulse: 62  (!) 56 84  Resp: 18  18 16   Temp: 97.8 F (36.6 C)  98.3 F (36.8 C) 97.6 F (36.4 C)  TempSrc: Oral  Oral Oral  SpO2: 94%  98% 99%  Weight:  85 kg    Height:        Intake/Output Summary (Last 24 hours) at 12/25/2020 1105 Last data filed at 12/25/2020 0900 Gross per 24 hour  Intake 620 ml  Output 1425 ml  Net -805 ml   Filed Weights   12/23/20 1638 12/24/20 0302 12/25/20 0500  Weight: 79.3 kg 85.5 kg 85 kg    Examination:  General exam: Appears calm and comfortable, pleasantly confused Respiratory system: Decreased breath sounds bilateral bases with crackles, normal respiratory effort without accessory muscle use, on room air with SPO2 97%. Cardiovascular system: S1 & S2 heard, RRR. No JVD, murmurs, rubs, gallops or clicks.  3+ pitting pedal edema to knees Gastrointestinal system: Abdomen is nondistended, soft and nontender. No organomegaly or masses felt. Normal bowel sounds heard. Central nervous system: Alert, not oriented to person/place/time or situation. No focal neurological deficits. Extremities: Symmetric 5 x 5 power. Skin: No rashes, lesions or ulcers Psychiatry: Judgement and  insight appear poor. Mood & affect appropriate.     Data Reviewed: I have personally reviewed following labs and imaging studies  CBC: Recent Labs  Lab 12/22/20 2359  WBC 5.9  NEUTROABS 4.0  HGB 13.6  HCT 43.2  MCV 104.1*  PLT 161   Basic Metabolic Panel: Recent Labs  Lab 12/22/20 2359 12/24/20 0507 12/25/20 0143  NA 142 141 139  K 4.0 4.7 4.1  CL 108 108 106  CO2 23 23 27   GLUCOSE 136* 141* 112*  BUN 18 25* 24*  CREATININE 0.93 1.04 0.92  CALCIUM 9.2 9.5 9.2  MG  --  2.2 2.1   GFR: Estimated Creatinine Clearance: 76.9 mL/min (by C-G formula based on SCr of 0.92 mg/dL). Liver Function Tests: No results for input(s): AST, ALT, ALKPHOS, BILITOT, PROT, ALBUMIN in the last 168 hours. No results for input(s): LIPASE, AMYLASE in the last 168 hours. No results for input(s): AMMONIA in the last 168 hours. Coagulation Profile: No results for input(s): INR, PROTIME in the last 168 hours. Cardiac Enzymes: No results for input(s): CKTOTAL, CKMB, CKMBINDEX, TROPONINI in the last 168 hours. BNP (last 3 results) No results for input(s): PROBNP in the last 8760 hours. HbA1C: No results for input(s): HGBA1C in the last 72  hours. CBG: No results for input(s): GLUCAP in the last 168 hours. Lipid Profile: No results for input(s): CHOL, HDL, LDLCALC, TRIG, CHOLHDL, LDLDIRECT in the last 72 hours. Thyroid Function Tests: No results for input(s): TSH, T4TOTAL, FREET4, T3FREE, THYROIDAB in the last 72 hours. Anemia Panel: No results for input(s): VITAMINB12, FOLATE, FERRITIN, TIBC, IRON, RETICCTPCT in the last 72 hours. Sepsis Labs: No results for input(s): PROCALCITON, LATICACIDVEN in the last 168 hours.  Recent Results (from the past 240 hour(s))  Resp Panel by RT-PCR (Flu A&B, Covid) Nasopharyngeal Swab     Status: None   Collection Time: 12/23/20 12:04 AM   Specimen: Nasopharyngeal Swab; Nasopharyngeal(NP) swabs in vial transport medium  Result Value Ref Range Status   SARS  Coronavirus 2 by RT PCR NEGATIVE NEGATIVE Final    Comment: (NOTE) SARS-CoV-2 target nucleic acids are NOT DETECTED.  The SARS-CoV-2 RNA is generally detectable in upper respiratory specimens during the acute phase of infection. The lowest concentration of SARS-CoV-2 viral copies this assay can detect is 138 copies/mL. A negative result does not preclude SARS-Cov-2 infection and should not be used as the sole basis for treatment or other patient management decisions. A negative result may occur with  improper specimen collection/handling, submission of specimen other than nasopharyngeal swab, presence of viral mutation(s) within the areas targeted by this assay, and inadequate number of viral copies(<138 copies/mL). A negative result must be combined with clinical observations, patient history, and epidemiological information. The expected result is Negative.  Fact Sheet for Patients:  EntrepreneurPulse.com.au  Fact Sheet for Healthcare Providers:  IncredibleEmployment.be  This test is no t yet approved or cleared by the [place] FDA and  has been authorized for detection and/or diagnosis of SARS-CoV-2 by FDA under an Emergency Use Authorization (EUA). This EUA will remain  in effect (meaning this test can be used) for the duration of the COVID-19 declaration under Section 564(b)(1) of the Act, 21 U.S.C.section 360bbb-3(b)(1), unless the authorization is terminated  or revoked sooner.       Influenza A by PCR NEGATIVE NEGATIVE Final   Influenza B by PCR NEGATIVE NEGATIVE Final    Comment: (NOTE) The Xpert Xpress SARS-CoV-2/FLU/RSV plus assay is intended as an aid in the diagnosis of influenza from Nasopharyngeal swab specimens and should not be used as a sole basis for treatment. Nasal washings and aspirates are unacceptable for Xpert Xpress SARS-CoV-2/FLU/RSV testing.  Fact Sheet for  Patients: EntrepreneurPulse.com.au  Fact Sheet for Healthcare Providers: IncredibleEmployment.be  This test is not yet approved or cleared by the [place] FDA and has been authorized for detection and/or diagnosis of SARS-CoV-2 by FDA under an Emergency Use Authorization (EUA). This EUA will remain in effect (meaning this test can be used) for the duration of the COVID-19 declaration under Section 564(b)(1) of the Act, 21 U.S.C. section 360bbb-3(b)(1), unless the authorization is terminated or revoked.  Performed at [facility], [place] [address] [place], [place] [address]          Radiology Studies: No results found.      Scheduled Meds:  aspirin EC  81 mg Oral Daily   busPIRone  5 mg Oral TID   donepezil  10 mg Oral QHS   enoxaparin (LOVENOX) injection  40 mg Subcutaneous Q24H   feeding supplement  237 mL Oral TID BM   furosemide  40 mg Intravenous Q8H   losartan  25 mg Oral Daily   metoprolol succinate  50 mg Oral Daily  multivitamin with minerals  1 tablet Oral Daily   pantoprazole  40 mg Oral Daily   sodium chloride flush  3 mL Intravenous Q12H   spironolactone  25 mg Oral Daily   Continuous Infusions:  sodium chloride       LOS: 1 day    Time spent: 41 minutes spent on chart review, discussion with nursing staff, consultants, updating family and interview/physical exam; more than 50% of that time was spent in counseling and/or coordination of care.    Satoru Milich J [place] DO [facility] Available via Epic secure chat 7am-7pm After these hours, please refer to coverage provider listed on [email or web address] 12/25/2020, 11:05 AM

## 2020-12-26 DIAGNOSIS — Z7189 Other specified counseling: Secondary | ICD-10-CM | POA: Diagnosis not present

## 2020-12-26 DIAGNOSIS — J9601 Acute respiratory failure with hypoxia: Secondary | ICD-10-CM | POA: Diagnosis not present

## 2020-12-26 DIAGNOSIS — I4892 Unspecified atrial flutter: Secondary | ICD-10-CM | POA: Diagnosis not present

## 2020-12-26 DIAGNOSIS — I5023 Acute on chronic systolic (congestive) heart failure: Secondary | ICD-10-CM | POA: Diagnosis not present

## 2020-12-26 LAB — BASIC METABOLIC PANEL
Anion gap: 11 (ref 5–15)
BUN: 25 mg/dL — ABNORMAL HIGH (ref 8–23)
CO2: 27 mmol/L (ref 22–32)
Calcium: 9.3 mg/dL (ref 8.9–10.3)
Chloride: 101 mmol/L (ref 98–111)
Creatinine, Ser: 0.88 mg/dL (ref 0.61–1.24)
GFR, Estimated: >60 mL/min (ref >60)
Glucose, Bld: 153 mg/dL — ABNORMAL HIGH (ref 70–99)
Potassium: 3.3 mmol/L — ABNORMAL LOW (ref 3.5–5.1)
Sodium: 139 mmol/L (ref 135–145)

## 2020-12-26 LAB — MAGNESIUM: Magnesium: 2 mg/dL (ref 1.7–2.4)

## 2020-12-26 MED ORDER — POTASSIUM CHLORIDE CRYS ER 20 MEQ PO TBCR
40.0000 meq | EXTENDED_RELEASE_TABLET | ORAL | Status: AC
  Administered 2020-12-26: 40 meq via ORAL
  Filled 2020-12-26: qty 2

## 2020-12-26 NOTE — Consult Note (Signed)
Consultation Note Date: 12/26/2020   Patient Name: Craig [name]  DOB: 04-May-1941  MRN: [id number]  Age / Sex: 80 y.o., male  PCP: Craig Merles, MD Referring Physician: [place] Eric J, DO  Reason for Consultation: Establishing goals of care  HPI/Patient Profile: 80 y.o. male  from [place] SNF with past medical history of HFrEF (LVEF 25-30%), dementia, and atrial flutter in the setting of CAD, who was admitted on 12/22/2020 with SOB.  He was found to have a HF exacerbation with a BNP of over 3500.  His EF this admission measures 20% with global left ventricle hypokinesis.  He is not a candidate for advanced HF therapies. Creatinine is 0.88, albumin in 12/21 was 2.0  Clinical Assessment and Goals of Care: I have reviewed medical records including EPIC notes, labs and imaging, received report from RN, assessed the patient and then met at the bedside along with his son and daughter Craig [name] and [place] [name]) to discuss diagnosis prognosis, GOC, EOL wishes, disposition and options.  I introduced Palliative Medicine as specialized medical care for people living with serious illness. It focuses on providing relief from the symptoms and stress of a serious illness. The goal is to improve quality of life for both the patient and the family.  We discussed a brief life review of the patient and then focused on their current illness. The natural disease trajectory and expectations at EOL were discussed.  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  He was always a very hard working man.  He never met a stranger and is loved by many friends.  He has two adult children who together are his HCPOA.Marland Kitchen   Per Craig [name] his father loves water - he will drink water like a man in the desert.   We discussed  Craig [name]'s very bad heart failure and his dementia.  Craig [name] explains that his father had surgery for his hernia just before Thanksgiving 2021 and  has declined steeply since.  He has declined in terms of his mind and his physical function.  Craig [name] expresses that the facility tells him Craig [name] is not eating well.  This is confusing to him because if he goes to feed his father he eats well.   We talked thru the steep decline in his heart function between November 2021 and today.  Craig [name] notes that he father just doesn't feel like getting up and walking anymore.  I advised that Craig [name] has reached a point in this life where he should receive exactly what he wants and he should not be forced to do anything he does not want to do.  We discussed his prognosis of 6 months or less and I explained this made him eligible for [name].   I explained Palliative [name] as well has [name] both at a Nursing facility and at a Hospice facility.  I attempted to elicit values and goals of care important to the patient.  This was difficult for Craig [name] and Craig [name] as Craig [name] had never been specific  about his desires regarding EOL care.  Both children drew on what they would want for themselves and for him.  Craig [name] was pragmatic stating that his father would not want to be living on machines.  Cra[name] expressed that she would want [place] to take all measures to keep him alive.  We talked thru the very low probability of Craig[place]ace surviving cardiac arrest and if he did survive he would require life support.   Both siblings felt it was important to give it a try - both siblings wanted resuscitation attempted.  We talked thru a MOST form.  The two HCPOAs desired full code, full scope treatment with the only limitations being No Long Term Life Support and No Long Term Feeding Tube.  Craig [name] and [name] would not want a tracheostomy or PEG.  Hospice and Palliative [facility] [name] outpatient were explained and offered.  Palliative [name] were accepted at SNF, and as soon as the patient is eligible for them the family would like to accept [facility]osp[name].  Discussed the importance of continued conversation with family and the medical providers regarding overall plan of care and treatment options, ensuring decisions are within the context of the patient's values and GOCs.    Questions and concerns were addressed.  Hard Choices booklet left for review. The family was encouraged to call with questions or concerns.  PMT will continue to support holistically.    Primary Decision Maker:  HCPOA  The Craig [name] is both son and daughter, Craig [name] and Craig [name].    SUMMARY OF RECOMMENDATIONS    Per Craig [name] and Craig [name] this is their first in depth conversation about their father's health and the first time they have reviewed a MOST form.     Additional education from multiple providers would benefit Craig [name] and [name].  They love their father very much and are trying to do what is best for him.   I think it would be very beneficial for them to hear directly from Cardiology and the attending physician.  Family does not want him to return to [place].  They prefer he be placed at a SNF in [name] closer to them.  They request that he be followed outpatient by Palliative Care [name]) as he will likely progress to hospice and family desires an [name] when his time comes.  Code Status/Advance Care Planning: Full code, but no long term life support.   No trach, no PEG.   Feeding tube for a trial period is ok.   Symptom Management:  Patient appears comfortable and pleasant. Patient loves water - he would benefit from frequent oral care, and perhaps hard candy to suck on.  Additional Recommendations (Limitations, Scope, Preferences): Full Scope Treatment  Palliative Prophylaxis:  Delirium Protocol  Psycho-social/Spiritual:  Desire for further Chaplaincy support: No discussed.  Son Craig [name] expresses a strong Christian faith.  Prognosis: 6 months or less if the disease was allowed to run its natural course.   Unfortunately Craig [name] is  at high risk to decline and be readmitted.   Discharge Planning: [place] for rehab with Palliative care service follow-up      Primary Diagnoses: Present on Admission:  HFrEF (heart failure with reduced ejection fraction) (HCC)  Dementia with behavioral disturbance ([place])  Essential hypertension  Acute on chronic HFrEF (heart failure with reduced ejection fraction) ([place])  Acute respiratory failure with hypoxia (HCC)  Atrial flutter ([place])   I have reviewed the medical record, interviewed the patient and family, and examined  the patient. The following aspects are pertinent.  Past Medical History:  Diagnosis Date   Atrial flutter ([place])    Coronary artery disease    Hyperlipidemia    Hypertension    TIA (transient ischemic attack)    Social History   Socioeconomic History   Marital status: Divorced    Spouse name: Not on file   Number of children: Not on file   Years of education: Not on file   Highest education level: Not on file  Occupational History   Not on file  Tobacco Use   Smoking status: Former    Packs/day: 0.00    Pack years: 0.00    Types: Cigarettes   Smokeless tobacco: Current    Types: Snuff  Substance and Sexual Activity   Alcohol use: Yes   Drug use: No   Sexual activity: Not on file  Other Topics Concern   Not on file  Social History Narrative   Not on file   Social Determinants of Health   Financial Resource Strain: Not on file  Food Insecurity: Not on file  Transportation Needs: Not on file  Physical Activity: Not on file  Stress: Not on file  Social Connections: Not on file   Family History  Family history unknown: Yes    Allergies  Allergen Reactions   Penicillins      Vital Signs: BP 127/77 (BP Location: Right Arm)   Pulse 72   Temp (!) 97.5 F (36.4 C) (Oral)   Resp 19   Ht 6' 2"  (1.88 m)   Wt 84 kg   SpO2 96%   BMI 23.78 kg/m  Pain Scale: PAINAD   Pain Score: 0-No pain   SpO2: SpO2: 96 % O2  Device:SpO2: 96 % O2 Flow Rate: .O2 Flow Rate (L/min): 4 L/min    Palliative Assessment/Data: 30%     Time In: 4:30 Time Out: 6:00 Time Total: 90 min. Visit consisted of counseling and education dealing with the complex and emotionally intense issues surrounding the need for palliative care and symptom management in the setting of serious and potentially life-threatening illness. Greater than 50%  of this time was spent counseling and coordinating care related to the above assessment and plan.  Signed by: Florentina Jenny, PA-C Palliative Medicine  Please contact Palliative Medicine Team phone at [phone] for questions and concerns.  For individual provider: See Shea Evans

## 2020-12-26 NOTE — Progress Notes (Addendum)
PROGRESS NOTE    Zachory Mangual  KXF:[id number] DOB: [birth year] DOA: 12/22/2020 PCP: [name], MD    Brief Narrative:  Craig [name] is a 80 year old male with past medical history significant for chronic systolic congestive heart failure (EF 25-30% 05/2020), atrial flutter not on anticoagulation, dementia, CAD who presented to [facility] ED via EMS from [facility] due to progressive shortness of breath and lower extremity edema.  Patient unable to contribute to history secondary to his advanced dementia.  Apparently symptoms present for several weeks and was started on Lasix without relief.  In the ED, temperature 97.6 F, HR 82, RR 16, BP 148/107, SPO2 90% on 3 L nasal cannula. (Was reported to be 88% on room air on arrival.)  Sodium 142, potassium 4.0, chloride 108, CO2 23, glucose 136, BUN 18, creatinine 0.93.  WBC 5.9, hemoglobin 13.6, platelets 195.  BNP 3784.  High sensitive troponin 40>41.  SARS-CoV-2/influenza A/B PCR negative.  EKG with atrial flutter, rate 92, QTc 511.  Chest x-ray with cardiac enlargement, patchy left perihilar/right basilar infiltrates versus asymmetrical edema. Patient given 40 mg IV Lasix by EDP.  TRH consulted for further evaluation and management of acute on chronic combined systolic/diastolic congestive heart failure.  Assessment & Plan:   Principal Problem:   Acute respiratory failure with hypoxia (HCC) Active Problems:   Essential hypertension   Atrial flutter (HCC)   Dementia with behavioral disturbance (HCC)   HFrEF (heart failure with reduced ejection fraction) (HCC)   Acute on chronic HFrEF (heart failure with reduced ejection fraction) (HCC)   Acute on chronic heart failure (HCC)   Acute respiratory failure with hypoxia, POA Acute on chronic combined systolic/diastolic congestive heart failure Patient presenting to ED from SNF with progressive shortness of breath and lower extremity edema over the past few weeks; despite  initiation of oral Lasix outpatient.  Patient was hypoxic with SPO2 88% on room air at arrival with elevated BNP and imaging studies suggestive of pulmonary vascular congestion.  TTE with LVEF less than 20%, LV severely decreased function, LV global hypokinesis, mild LVH, LA severely dilated, RA moderately dilated, aortic dilation 37 mm, IVC dilated. --Net negative 2.0L p24h and net negative 8.3L since admission --continue furosemide to 40 mg IV q8h --started spironolactone 25mg  PO daily --Metoprolol succinate 50 mg p.o. daily --Losartan 25 mg p.o. daily --Place TED hose --Strict I's and O's and daily weights --Continue supplemental oxygen, maintain SPO2 greater than 92%; now titrated off --BMP daily  Hypokalemia Potassium 3.3, magnesium 2.0 this morning.  Will replete potassium. --Follow electrolytes daily  Essential hypertension BP 127/77 this am. --Started spironolactone 25 mg p.o. daily today --Metoprolol succinate 50 mg p.o. daily --Losartan 25 mg p.o. daily -- IV furosemide as above --Aspirin 81 mg p.o. daily --Continue monitor BP closely  Atrial flutter Not on anticoagulation due to dementia and frequent falls. --Continue rate control with metoprolol succinate 50 mg p.o. daily --Monitor on telemetry  Dementia --Donepezil 10 mg p.o. nightly --BuSpar 5 mg p.o. 3 times daily  Prolonged QTC EKG on admission with QTC 511. --Avoid QT prolonging medications --Monitor on telemetry  GERD: Continue PPI  Ethics: Patient presenting from SNF with acute on chronic congestive heart failure with worsening EF and significant peripheral edema.  History of dementia.  Consulting palliative care for assistance with goals of care and medical decision making as he is likely ending end-stage heart failure.   DVT prophylaxis: Place TED hose Start: 12/25/20 0910 enoxaparin (LOVENOX) injection  40 mg Start: 12/23/20 1000   Code Status: Full Code Family Communication: No family present at  bedside  Disposition Plan:  Level of care: Telemetry Cardiac Status is: Inpatient  Remains inpatient appropriate because:IV treatments appropriate due to intensity of illness or inability to take PO and Inpatient level of care appropriate due to severity of illness  Dispo: The patient is from: SNF              Anticipated d/c is to: SNF              Patient currently is not medically stable to d/c.   Difficult to place patient No  Consultants:  None  Procedures:  TTE  Antimicrobials:  None   Subjective: Patient seen examined at bedside, resting comfortably.  Sleeping but easily arousable.  Pleasantly confused.  No specific complaints this morning.  Continues with good urine output with IV Lasix.  Denies headache, no chest pain, no palpitations, no abdominal pain.  No acute concerns overnight per nursing staff.  Objective: Vitals:   12/25/20 1126 12/25/20 1915 12/26/20 0005 12/26/20 0347  BP: (!) 158/104 132/89  127/77  Pulse: 70 (!) 52  72  Resp: 16 19  19   Temp: (!) 97.5 F (36.4 C) 98.5 F (36.9 C)  (!) 97.5 F (36.4 C)  TempSrc: Oral Oral  Oral  SpO2:  92%  96%  Weight:   84 kg   Height:        Intake/Output Summary (Last 24 hours) at 12/26/2020 1019 Last data filed at 12/26/2020 0851 Gross per 24 hour  Intake 843 ml  Output 3650 ml  Net -2807 ml   Filed Weights   12/24/20 0302 12/25/20 0500 12/26/20 0005  Weight: 85.5 kg 85 kg 84 kg    Examination:  General exam: Appears calm and comfortable, pleasantly confused Respiratory system: Decreased breath sounds bilateral bases with crackles, normal respiratory effort without accessory muscle use, on room air with SPO2 96 %. Cardiovascular system: S1 & S2 heard, RRR. No JVD, murmurs, rubs, gallops or clicks.  2+ pitting pedal edema to knees Gastrointestinal system: Abdomen is nondistended, soft and nontender. No organomegaly or masses felt. Normal bowel sounds heard. Central nervous system: Alert, not oriented  to person/place/time or situation. No focal neurological deficits. Extremities: Symmetric 5 x 5 power. Skin: No rashes, lesions or ulcers Psychiatry: Judgement and insight appear poor. Mood & affect appropriate.     Data Reviewed: I have personally reviewed following labs and imaging studies  CBC: Recent Labs  Lab 12/22/20 2359  WBC 5.9  NEUTROABS 4.0  HGB 13.6  HCT 43.2  MCV 104.1*  PLT 941   Basic Metabolic Panel: Recent Labs  Lab 12/22/20 2359 12/24/20 0507 12/25/20 0143 12/26/20 0248  NA 142 141 139 139  K 4.0 4.7 4.1 3.3*  CL 108 108 106 101  CO2 23 23 27 27   GLUCOSE 136* 141* 112* 153*  BUN 18 25* 24* 25*  CREATININE 0.93 1.04 0.92 0.88  CALCIUM 9.2 9.5 9.2 9.3  MG  --  2.2 2.1 2.0   GFR: Estimated Creatinine Clearance: 80.4 mL/min (by C-G formula based on SCr of 0.88 mg/dL). Liver Function Tests: No results for input(s): AST, ALT, ALKPHOS, BILITOT, PROT, ALBUMIN in the last 168 hours. No results for input(s): LIPASE, AMYLASE in the last 168 hours. No results for input(s): AMMONIA in the last 168 hours. Coagulation Profile: No results for input(s): INR, PROTIME in the last 168 hours. Cardiac Enzymes:  No results for input(s): CKTOTAL, CKMB, CKMBINDEX, TROPONINI in the last 168 hours. BNP (last 3 results) No results for input(s): PROBNP in the last 8760 hours. HbA1C: No results for input(s): HGBA1C in the last 72 hours. CBG: No results for input(s): GLUCAP in the last 168 hours. Lipid Profile: No results for input(s): CHOL, HDL, LDLCALC, TRIG, CHOLHDL, LDLDIRECT in the last 72 hours. Thyroid Function Tests: No results for input(s): TSH, T4TOTAL, FREET4, T3FREE, THYROIDAB in the last 72 hours. Anemia Panel: No results for input(s): VITAMINB12, FOLATE, FERRITIN, TIBC, IRON, RETICCTPCT in the last 72 hours. Sepsis Labs: No results for input(s): PROCALCITON, LATICACIDVEN in the last 168 hours.  Recent Results (from the past 240 hour(s))  Resp Panel by  RT-PCR (Flu A&B, Covid) Nasopharyngeal Swab     Status: None   Collection Time: 12/23/20 12:04 AM   Specimen: Nasopharyngeal Swab; Nasopharyngeal(NP) swabs in vial transport medium  Result Value Ref Range Status   SARS Coronavirus 2 by RT PCR NEGATIVE NEGATIVE Final    Comment: (NOTE) SARS-CoV-2 target nucleic acids are NOT DETECTED.  The SARS-CoV-2 RNA is generally detectable in upper respiratory specimens during the acute phase of infection. The lowest concentration of SARS-CoV-2 viral copies this assay can detect is 138 copies/mL. A negative result does not preclude SARS-Cov-2 infection and should not be used as the sole basis for treatment or other patient management decisions. A negative result may occur with  improper specimen collection/handling, submission of specimen other than nasopharyngeal swab, presence of viral mutation(s) within the areas targeted by this assay, and inadequate number of viral copies(<138 copies/mL). A negative result must be combined with clinical observations, patient history, and epidemiological information. The expected result is Negative.  Fact Sheet for Patients:  EntrepreneurPulse.com.au  Fact Sheet for Healthcare Providers:  IncredibleEmployment.be  This test is no t yet approved or cleared by the [place] FDA and  has been authorized for detection and/or diagnosis of SARS-CoV-2 by FDA under an Emergency Use Authorization (EUA). This EUA will remain  in effect (meaning this test can be used) for the duration of the COVID-19 declaration under Section 564(b)(1) of the Act, 21 U.S.C.section 360bbb-3(b)(1), unless the authorization is terminated  or revoked sooner.       Influenza A by PCR NEGATIVE NEGATIVE Final   Influenza B by PCR NEGATIVE NEGATIVE Final    Comment: (NOTE) The Xpert Xpress SARS-CoV-2/FLU/RSV plus assay is intended as an aid in the diagnosis of influenza from Nasopharyngeal swab  specimens and should not be used as a sole basis for treatment. Nasal washings and aspirates are unacceptable for Xpert Xpress SARS-CoV-2/FLU/RSV testing.  Fact Sheet for Patients: EntrepreneurPulse.com.au  Fact Sheet for Healthcare Providers: IncredibleEmployment.be  This test is not yet approved or cleared by the [place] FDA and has been authorized for detection and/or diagnosis of SARS-CoV-2 by FDA under an Emergency Use Authorization (EUA). This EUA will remain in effect (meaning this test can be used) for the duration of the COVID-19 declaration under Section 564(b)(1) of the Act, 21 U.S.C. section 360bbb-3(b)(1), unless the authorization is terminated or revoked.  Performed at [facility], [place] [address] [place], [place] [address]          Radiology Studies: No results found.      Scheduled Meds:  aspirin EC  81 mg Oral Daily   busPIRone  5 mg Oral TID   donepezil  10 mg Oral QHS   enoxaparin (LOVENOX) injection  40 mg Subcutaneous  Q24H   feeding supplement  237 mL Oral TID BM   furosemide  40 mg Intravenous Q8H   losartan  25 mg Oral Daily   metoprolol succinate  50 mg Oral Daily   multivitamin with minerals  1 tablet Oral Daily   pantoprazole  40 mg Oral Daily   potassium chloride  40 mEq Oral Q4H   sodium chloride flush  3 mL Intravenous Q12H   spironolactone  25 mg Oral Daily   Continuous Infusions:  sodium chloride       LOS: 2 days    Time spent: 38 minutes spent on chart review, discussion with nursing staff, consultants, updating family and interview/physical exam; more than 50% of that time was spent in counseling and/or coordination of care.    Lawson Mahone J [place] DO [facility] Available via Epic secure chat 7am-7pm After these hours, please refer to coverage provider listed on [email or web address] 12/26/2020, 10:19 AM

## 2020-12-27 DIAGNOSIS — J9601 Acute respiratory failure with hypoxia: Secondary | ICD-10-CM | POA: Diagnosis not present

## 2020-12-27 LAB — BASIC METABOLIC PANEL
Anion gap: 7 (ref 5–15)
BUN: 28 mg/dL — ABNORMAL HIGH (ref 8–23)
CO2: 30 mmol/L (ref 22–32)
Calcium: 8.8 mg/dL — ABNORMAL LOW (ref 8.9–10.3)
Chloride: 103 mmol/L (ref 98–111)
Creatinine, Ser: 0.9 mg/dL (ref 0.61–1.24)
GFR, Estimated: >60 mL/min (ref >60)
Glucose, Bld: 150 mg/dL — ABNORMAL HIGH (ref 70–99)
Potassium: 3.2 mmol/L — ABNORMAL LOW (ref 3.5–5.1)
Sodium: 140 mmol/L (ref 135–145)

## 2020-12-27 LAB — MAGNESIUM: Magnesium: 2.1 mg/dL (ref 1.7–2.4)

## 2020-12-27 MED ORDER — POTASSIUM CHLORIDE CRYS ER 20 MEQ PO TBCR
40.0000 meq | EXTENDED_RELEASE_TABLET | ORAL | Status: AC
  Administered 2020-12-27 (×2): 40 meq via ORAL
  Filled 2020-12-27 (×2): qty 2

## 2020-12-27 NOTE — Plan of Care (Signed)
  Problem: Safety: Goal: Ability to remain free from injury will improve Outcome: Progressing   

## 2020-12-27 NOTE — NC FL2 (Signed)
[place] LEVEL OF CARE SCREENING TOOL     IDENTIFICATION  Patient Name: Craig [name] Birthdate: [birth year] Sex: male Admission Date (Current Location): 12/22/2020  [facility] and [place] Number:  Herbalist and Address:  [facility], [place] [address] [place], [place] [address]      Provider Number: [id number]  Attending Physician Name and Address:  [place] Eric J, DO  Relative Name and Phone Number:  [place], Fedor     [phone]    Current Level of Care: Hospital Recommended Level of Care: [place] Prior Approval Number:    Date Approved/Denied:   PASRR Number:    Discharge Plan: SNF    Current Diagnoses: Patient Active Problem List   Diagnosis Date Noted   Acute on chronic heart failure ([place]) 12/24/2020   Acute on chronic HFrEF (heart failure with reduced ejection fraction) ([place]) 12/23/2020   Acute respiratory failure with hypoxia ([place]) 12/23/2020   HFrEF (heart failure with reduced ejection fraction) (HCC)    Atrial flutter with rapid ventricular response ([place]) 05/26/2020   SBO (small bowel obstruction) ([place]) 05/26/2020   Hypernatremia 05/26/2020   Nicotine dependence 05/26/2020   Protein-calorie malnutrition, severe 04/22/2020   Dementia with behavioral disturbance ([place]) 04/21/2020   Altered mental status 04/20/2020   Atrial flutter (HCC)    Bradycardia    CAD (coronary artery disease), native coronary artery 12/25/2017   History of coronary artery stent placement 12/25/2017   Hyperlipidemia 12/25/2017   Former smoker 12/25/2017   Essential hypertension 12/25/2017   Memory loss 12/25/2017    Orientation RESPIRATION BLADDER Height & Weight     Self  Normal Continent, External catheter Weight: 176 lb 5.9 oz (80 kg) Height:  6\' 2"  (188 cm)  BEHAVIORAL SYMPTOMS/MOOD NEUROLOGICAL BOWEL NUTRITION STATUS      Continent Diet (See DC Summary)  AMBULATORY STATUS COMMUNICATION OF NEEDS Skin    Extensive Assist Verbally Normal                       Personal Care Assistance Level of Assistance  Bathing, Feeding, Dressing Bathing Assistance: Maximum assistance Feeding assistance: Independent Dressing Assistance: Maximum assistance     Functional Limitations Info  Sight, Speech, Hearing Sight Info: Adequate Hearing Info: Impaired Speech Info: Adequate    SPECIAL CARE FACTORS FREQUENCY  PT (By licensed PT), OT (By licensed OT)     PT Frequency: 5x a week OT Frequency: 5x a week            Contractures Contractures Info: Not present    Additional Factors Info  Code Status, Allergies Code Status Info: Full Allergies Info: Penicillins           Current Medications (12/27/2020):  This is the current hospital active medication list Current Facility-Administered Medications  Medication Dose Route Frequency Provider Last Rate Last Admin   0.9 %  sodium chloride infusion  250 mL Intravenous PRN Etta Quill, DO       acetaminophen (TYLENOL) tablet 650 mg  650 mg Oral Q4H PRN Etta Quill, DO       aspirin EC tablet 81 mg  81 mg Oral Daily Jennette Kettle M, DO   81 mg at 12/27/20 0904   busPIRone (BUSPAR) tablet 5 mg  5 mg Oral TID Etta Quill, DO   5 mg at 12/27/20 0904   donepezil (ARICEPT) tablet 10 mg  10 mg Oral QHS Etta Quill,  DO   10 mg at 12/26/20 2115   enoxaparin (LOVENOX) injection 40 mg  40 mg Subcutaneous Q24H Jennette Kettle M, DO   40 mg at 12/27/20 0905   feeding supplement (ENSURE ENLIVE / ENSURE PLUS) liquid 237 mL  237 mL Oral TID BM Etta Quill, DO   237 mL at 12/27/20 0908   furosemide (LASIX) injection 40 mg  40 mg Intravenous Q8H [place] Donnamarie Poag, DO   40 mg at 12/27/20 1610   losartan (COZAAR) tablet 25 mg  25 mg Oral Daily Jennette Kettle M, DO   25 mg at 12/27/20 9604   metoprolol succinate (TOPROL-XL) 24 hr tablet 50 mg  50 mg Oral Daily Jennette Kettle M, DO   50 mg at 12/27/20 5409   multivitamin with minerals  tablet 1 tablet  1 tablet Oral Daily Etta Quill, DO   1 tablet at 12/27/20 0903   ondansetron (ZOFRAN) injection 4 mg  4 mg Intravenous Q6H PRN Etta Quill, DO       pantoprazole (PROTONIX) EC tablet 40 mg  40 mg Oral Daily Jennette Kettle M, DO   40 mg at 12/27/20 0904   simethicone (MYLICON) chewable tablet 80 mg  80 mg Oral Q6H PRN Etta Quill, DO       sodium chloride flush (NS) 0.9 % injection 3 mL  3 mL Intravenous Q12H Jennette Kettle M, DO   3 mL at 12/27/20 1251   sodium chloride flush (NS) 0.9 % injection 3 mL  3 mL Intravenous PRN Etta Quill, DO       spironolactone (ALDACTONE) tablet 25 mg  25 mg Oral Daily [place] Donnamarie Poag, DO   25 mg at 12/27/20 1245     Discharge Medications: Please see discharge summary for a list of discharge medications.  Relevant Imaging Results:  Relevant Lab Results:   Additional Information SS#: [id number]  Reece Agar, LCSWA

## 2020-12-27 NOTE — Progress Notes (Signed)
PROGRESS NOTE    Lajuane Leatham  TKP:[id number] DOB: Sep 08, 1940 DOA: 12/22/2020 PCP: [name], MD    Brief Narrative:  Gleb Mcguire is a 80 year old male with past medical history significant for chronic systolic congestive heart failure (EF 25-30% 05/2020), atrial flutter not on anticoagulation, dementia, CAD who presented to [facility] ED via EMS from [facility] due to progressive shortness of breath and lower extremity edema.  Patient unable to contribute to history secondary to his advanced dementia.  Apparently symptoms present for several weeks and was started on Lasix without relief.  In the ED, temperature 97.6 F, HR 82, RR 16, BP 148/107, SPO2 90% on 3 L nasal cannula. (Was reported to be 88% on room air on arrival.)  Sodium 142, potassium 4.0, chloride 108, CO2 23, glucose 136, BUN 18, creatinine 0.93.  WBC 5.9, hemoglobin 13.6, platelets 195.  BNP 3784.  High sensitive troponin 40>41.  SARS-CoV-2/influenza A/B PCR negative.  EKG with atrial flutter, rate 92, QTc 511.  Chest x-ray with cardiac enlargement, patchy left perihilar/right basilar infiltrates versus asymmetrical edema. Patient given 40 mg IV Lasix by EDP.  TRH consulted for further evaluation and management of acute on chronic combined systolic/diastolic congestive heart failure.  Assessment & Plan:   Principal Problem:   Acute respiratory failure with hypoxia (HCC) Active Problems:   Essential hypertension   Atrial flutter (HCC)   Dementia with behavioral disturbance (HCC)   HFrEF (heart failure with reduced ejection fraction) (HCC)   Acute on chronic HFrEF (heart failure with reduced ejection fraction) (HCC)   Acute on chronic heart failure (HCC)   Acute respiratory failure with hypoxia, POA Acute on chronic combined systolic/diastolic congestive heart failure Patient presenting to ED from SNF with progressive shortness of breath and lower extremity edema over the past few weeks; despite  initiation of oral Lasix outpatient.  Patient was hypoxic with SPO2 88% on room air at arrival with elevated BNP and imaging studies suggestive of pulmonary vascular congestion.  TTE with LVEF less than 20%, LV severely decreased function, LV global hypokinesis, mild LVH, LA severely dilated, RA moderately dilated, aortic dilation 37 mm, IVC dilated. --Net negative 1.5L p24h and net negative 9.8L since admission --continue furosemide to 40 mg IV q8h --started spironolactone 25mg  PO daily --Metoprolol succinate 50 mg p.o. daily --Losartan 25 mg p.o. daily --Placed TED hose --Strict I's and O's and daily weights --Continue supplemental oxygen, maintain SPO2 greater than 92%; now titrated off --BMP daily  Hypokalemia Potassium 3.2, magnesium 2.1 this morning.  Will replete potassium. --Follow electrolytes daily  Essential hypertension BP 123/86 this am. --Started spironolactone 25 mg p.o. daily today --Metoprolol succinate 50 mg p.o. daily --Losartan 25 mg p.o. daily --IV furosemide as above --Aspirin 81 mg p.o. daily --Continue monitor BP closely  Atrial flutter Not on anticoagulation due to dementia and frequent falls. --Continue rate control with metoprolol succinate 50 mg p.o. daily --Monitor on telemetry  Dementia --Donepezil 10 mg p.o. nightly --BuSpar 5 mg p.o. 3 times daily  Prolonged QTC EKG on admission with QTC 511. --Avoid QT prolonging medications --Monitor on telemetry  GERD: Continue PPI  Ethics: Patient presenting from SNF with acute on chronic congestive heart failure with worsening EF and significant peripheral edema.  History of dementia. Palliative care for assistance with goals of care and medical decision making as he is likely ending end-stage heart failure.  Family wishes to continue full code with full scope of treatment with only limitations being  no long-term life support and no long-term feeding tube, no tracheostomy or PEG.  Will need outpatient  palliative care on discharge.   DVT prophylaxis: Place TED hose Start: 12/25/20 0910 enoxaparin (LOVENOX) injection 40 mg Start: 12/23/20 1000   Code Status: Full Code Family Communication: No family present at bedside, updated patient's son, Berneta Sages via telephone this morning  Disposition Plan:  Level of care: Telemetry Cardiac Status is: Inpatient  Remains inpatient appropriate because:IV treatments appropriate due to intensity of illness or inability to take PO and Inpatient level of care appropriate due to severity of illness  Dispo: The patient is from: SNF              Anticipated d/c is to: SNF              Patient currently is not medically stable to d/c.   Difficult to place patient No  Consultants:  None  Procedures:  TTE  Antimicrobials:  None   Subjective: Patient seen examined at bedside, resting comfortably.  Sleeping but easily arousable.  Pleasantly confused.  No specific complaints this morning.  Awaiting for breakfast to come.  Continues with good urine output with IV Lasix and improved lower extremity edema.  Denies headache, no chest pain, no palpitations, no abdominal pain.  No acute concerns overnight per nursing staff.  Objective: Vitals:   12/27/20 0500 12/27/20 0500 12/27/20 0729 12/27/20 0906  BP:  123/86 (!) 126/92   Pulse:  (!) 57 (!) 57 85  Resp:  19 16   Temp:  (!) 97.4 F (36.3 C) (!) 97.4 F (36.3 C)   TempSrc:  Oral Oral   SpO2:  94% 94%   Weight: 80 kg     Height:        Intake/Output Summary (Last 24 hours) at 12/27/2020 0949 Last data filed at 12/27/2020 7782 Gross per 24 hour  Intake 697 ml  Output 1925 ml  Net -1228 ml   Filed Weights   12/26/20 0005 12/27/20 0014 12/27/20 0500  Weight: 84 kg 80 kg 80 kg    Examination:  General exam: Appears calm and comfortable, pleasantly confused Respiratory system: Decreased breath sounds bilateral bases with crackles, normal respiratory effort without accessory muscle use, on room  air with SPO2 96 %. Cardiovascular system: S1 & S2 heard, RRR. No JVD, murmurs, rubs, gallops or clicks.  2+ pitting pedal edema to shins, TED hose noted in place Gastrointestinal system: Abdomen is nondistended, soft and nontender. No organomegaly or masses felt. Normal bowel sounds heard. Central nervous system: Alert, not oriented to person/place/time or situation. No focal neurological deficits. Extremities: Symmetric 5 x 5 power. Skin: No rashes, lesions or ulcers Psychiatry: Judgement and insight appear poor. Mood & affect appropriate.     Data Reviewed: I have personally reviewed following labs and imaging studies  CBC: Recent Labs  Lab 12/22/20 2359  WBC 5.9  NEUTROABS 4.0  HGB 13.6  HCT 43.2  MCV 104.1*  PLT 423   Basic Metabolic Panel: Recent Labs  Lab 12/22/20 2359 12/24/20 0507 12/25/20 0143 12/26/20 0248 12/27/20 0422  NA 142 141 139 139 140  K 4.0 4.7 4.1 3.3* 3.2*  CL 108 108 106 101 103  CO2 23 23 27 27 30   GLUCOSE 136* 141* 112* 153* 150*  BUN 18 25* 24* 25* 28*  CREATININE 0.93 1.04 0.92 0.88 0.90  CALCIUM 9.2 9.5 9.2 9.3 8.8*  MG  --  2.2 2.1 2.0 2.1   GFR: Estimated  Creatinine Clearance: 76.5 mL/min (by C-G formula based on SCr of 0.9 mg/dL). Liver Function Tests: No results for input(s): AST, ALT, ALKPHOS, BILITOT, PROT, ALBUMIN in the last 168 hours. No results for input(s): LIPASE, AMYLASE in the last 168 hours. No results for input(s): AMMONIA in the last 168 hours. Coagulation Profile: No results for input(s): INR, PROTIME in the last 168 hours. Cardiac Enzymes: No results for input(s): CKTOTAL, CKMB, CKMBINDEX, TROPONINI in the last 168 hours. BNP (last 3 results) No results for input(s): PROBNP in the last 8760 hours. HbA1C: No results for input(s): HGBA1C in the last 72 hours. CBG: No results for input(s): GLUCAP in the last 168 hours. Lipid Profile: No results for input(s): CHOL, HDL, LDLCALC, TRIG, CHOLHDL, LDLDIRECT in the last  72 hours. Thyroid Function Tests: No results for input(s): TSH, T4TOTAL, FREET4, T3FREE, THYROIDAB in the last 72 hours. Anemia Panel: No results for input(s): VITAMINB12, FOLATE, FERRITIN, TIBC, IRON, RETICCTPCT in the last 72 hours. Sepsis Labs: No results for input(s): PROCALCITON, LATICACIDVEN in the last 168 hours.  Recent Results (from the past 240 hour(s))  Resp Panel by RT-PCR (Flu A&B, Covid) Nasopharyngeal Swab     Status: None   Collection Time: 12/23/20 12:04 AM   Specimen: Nasopharyngeal Swab; Nasopharyngeal(NP) swabs in vial transport medium  Result Value Ref Range Status   SARS Coronavirus 2 by RT PCR NEGATIVE NEGATIVE Final    Comment: (NOTE) SARS-CoV-2 target nucleic acids are NOT DETECTED.  The SARS-CoV-2 RNA is generally detectable in upper respiratory specimens during the acute phase of infection. The lowest concentration of SARS-CoV-2 viral copies this assay can detect is 138 copies/mL. A negative result does not preclude SARS-Cov-2 infection and should not be used as the sole basis for treatment or other patient management decisions. A negative result may occur with  improper specimen collection/handling, submission of specimen other than nasopharyngeal swab, presence of viral mutation(s) within the areas targeted by this assay, and inadequate number of viral copies(<138 copies/mL). A negative result must be combined with clinical observations, patient history, and epidemiological information. The expected result is Negative.  Fact Sheet for Patients:  EntrepreneurPulse.com.au  Fact Sheet for Healthcare Providers:  IncredibleEmployment.be  This test is no t yet approved or cleared by the [place] FDA and  has been authorized for detection and/or diagnosis of SARS-CoV-2 by FDA under an Emergency Use Authorization (EUA). This EUA will remain  in effect (meaning this test can be used) for the duration of the COVID-19  declaration under Section 564(b)(1) of the Act, 21 U.S.C.section 360bbb-3(b)(1), unless the authorization is terminated  or revoked sooner.       Influenza A by PCR NEGATIVE NEGATIVE Final   Influenza B by PCR NEGATIVE NEGATIVE Final    Comment: (NOTE) The Xpert Xpress SARS-CoV-2/FLU/RSV plus assay is intended as an aid in the diagnosis of influenza from Nasopharyngeal swab specimens and should not be used as a sole basis for treatment. Nasal washings and aspirates are unacceptable for Xpert Xpress SARS-CoV-2/FLU/RSV testing.  Fact Sheet for Patients: EntrepreneurPulse.com.au  Fact Sheet for Healthcare Providers: IncredibleEmployment.be  This test is not yet approved or cleared by the [place] FDA and has been authorized for detection and/or diagnosis of SARS-CoV-2 by FDA under an Emergency Use Authorization (EUA). This EUA will remain in effect (meaning this test can be used) for the duration of the COVID-19 declaration under Section 564(b)(1) of the Act, 21 U.S.C. section 360bbb-3(b)(1), unless the authorization is terminated or revoked.  Performed at [facility], [place] [address] [place], [place] [address]          Radiology Studies: No results found.      Scheduled Meds:  aspirin EC  81 mg Oral Daily   busPIRone  5 mg Oral TID   donepezil  10 mg Oral QHS   enoxaparin (LOVENOX) injection  40 mg Subcutaneous Q24H   feeding supplement  237 mL Oral TID BM   furosemide  40 mg Intravenous Q8H   losartan  25 mg Oral Daily   metoprolol succinate  50 mg Oral Daily   multivitamin with minerals  1 tablet Oral Daily   pantoprazole  40 mg Oral Daily   potassium chloride  40 mEq Oral Q3H   sodium chloride flush  3 mL Intravenous Q12H   spironolactone  25 mg Oral Daily   Continuous Infusions:  sodium chloride       LOS: 3 days    Time spent: 39 minutes spent on chart review, discussion with nursing staff,  consultants, updating family and interview/physical exam; more than 50% of that time was spent in counseling and/or coordination of care.    Tachina Spoonemore J [place] DO [facility] Available via Epic secure chat 7am-7pm After these hours, please refer to coverage provider listed on [email or web address] 12/27/2020, 9:49 AM

## 2020-12-27 NOTE — TOC Initial Note (Addendum)
Transition of Care [facility]) - Initial/Assessment Note    Patient Details  Name: Craig [name] MRN: [id number] Date of Birth: [birth year]  [facility] CM/SW Contact:    Tresa Endo Phone Number: 12/27/2020, 2:08 PM  Clinical Narrative:  CSW received SNF consult. CSW met with pt son. CSW introduced self and explained role at the hospital. Pt is from [place] to receive care for CHF. PT reports that pt needs 2+min assistance and uses a 2 wheeled rolling walker. PT reports pt requires increased time and multimodal cues for mobility due to cognition.  CSW reviewed PT/OT recommendations for SNF. Pt son reports feeling tired of the long commute to visit his father, pt son and family lives in [place] and would like pt to switch facilities to somewhere closer to the [place] area. Pt son gave CSW permission to fax out to facilities in the area. Pt son has no preference of facility at this time. PT reports they are no covid vaccinations on file.  CSW will continue to follow.                  1408: CSW contacted pt daughter who shared with MD that pt and family would like a new SNF. There was no answer CSW will follow up.   1420: CSW contacted pt duaghter, no answer CSW left a message.  1426: CSW contacted pt son Craig [name] [phone] who states that it would be beneficial to the family for pt to be closer to the [place] area where everyone else is. CSW explained that pt information can be sent to facilities in the area and when offers are given CSW will provide pt son a list of choices.         Patient states their goals for this hospitalization and ongoing recovery are:: Rest CMS Medicare.gov Compare Post Acute Care list provided to:: Patient Choice offered to / list presented to : Patient   Expected Discharge Plan and Services In-house Referral: Clinical Social Work Living arrangements for the past 2 months: SNF at [facility] Arrangements/Services Living arrangements for the past 2 months:  Lives with:: Facility Residents Patient language and need for interpreter reviewed:: Yes Do you feel safe going back to the place where you live?: Yes      Need for Family Participation in Patient Care: Yes  Care giver support system in place?: Yes Current home services:N/A Criminal Activity/Legal Involvement Pertinent to Current Situation/Hospitalization: No   Activities of Daily Living Home Assistive Devices/Equipment: Environmental consultant (specify type), Wheelchair ADL Screening (condition at time of admission) Patient's cognitive ability adequate to safely complete daily activities?: No Is the patient deaf or have difficulty hearing?: Yes Does the patient have difficulty seeing, even when wearing glasses/contacts?: No Does the patient have difficulty concentrating, remembering, or making decisions?: Yes Patient able to express need for assistance with ADLs?: Yes Does the patient have difficulty dressing or bathing?: Yes Independently performs ADLs?: No Communication: Independent Dressing (OT): Needs assistance Is this a change from baseline?: Pre-admission baseline Grooming: Needs assistance Is this a change from baseline?: Pre-admission baseline Feeding: Needs assistance Is this a change from baseline?: Pre-admission baseline Bathing: Needs assistance Is this a change from baseline?: Pre-admission baseline Toileting: Needs assistance Is this a change from baseline?: Pre-admission baseline In/Out Bed: Needs assistance Is this a  change from baseline?: Pre-admission baseline Walks in Home: Needs assistance Is this a change from baseline?: Pre-admission baseline Does the patient have difficulty walking or climbing stairs?: Yes Weakness of Legs: Both Weakness of Arms/Hands: Both  Permission Sought/Granted Permission sought to share information with : Patient son and Customer service manager      Emotional Assessment  N/A          Admission diagnosis:  Acute on chronic systolic congestive heart failure (HCC) [I50.23] Acute on chronic HFrEF (heart failure with reduced ejection fraction) ([place]) [I50.23] Acute on chronic heart failure ([place]) [I50.9] Patient Active Problem List   Diagnosis Date Noted   Acute on chronic heart failure ([place]) 12/24/2020   Acute on chronic HFrEF (heart failure with reduced ejection fraction) ([place]) 12/23/2020   Acute respiratory failure with hypoxia ([place]) 12/23/2020   HFrEF (heart failure with reduced ejection fraction) ([place])    Atrial flutter with rapid ventricular response ([place]) 05/26/2020   SBO (small bowel obstruction) ([place]) 05/26/2020   Hypernatremia 05/26/2020   Nicotine dependence 05/26/2020   Protein-calorie malnutrition, severe 04/22/2020   Dementia with behavioral disturbance ([place]) 04/21/2020   Altered mental status 04/20/2020   Atrial flutter (HCC)    Bradycardia    CAD (coronary artery disease), native coronary artery 12/25/2017   History of coronary artery stent placement 12/25/2017   Hyperlipidemia 12/25/2017   Former smoker 12/25/2017   Essential hypertension 12/25/2017   Memory loss 12/25/2017   PCP:  [name], MD Pharmacy:   [id number] [id number]: [phone]: [phone]    Social Determinants of Health (SDOH) Interventions    Readmission Risk Interventions Readmission Risk Prevention Plan 06/15/2020  Transportation Screening Complete  PCP or Specialist Appt within 3-5 Days Complete  [place] or [place] Data[place]  Social Work Consult for [place]Counseling [place]  Medication Review (Press photographer Complete  Some recent data might be hidden

## 2020-12-28 DIAGNOSIS — J9601 Acute respiratory failure with hypoxia: Secondary | ICD-10-CM | POA: Diagnosis not present

## 2020-12-28 LAB — BASIC METABOLIC PANEL
Anion gap: 8 (ref 5–15)
BUN: 28 mg/dL — ABNORMAL HIGH (ref 8–23)
CO2: 30 mmol/L (ref 22–32)
Calcium: 8.9 mg/dL (ref 8.9–10.3)
Chloride: 100 mmol/L (ref 98–111)
Creatinine, Ser: 0.95 mg/dL (ref 0.61–1.24)
GFR, Estimated: >60 mL/min (ref >60)
Glucose, Bld: 113 mg/dL — ABNORMAL HIGH (ref 70–99)
Potassium: 3.5 mmol/L (ref 3.5–5.1)
Sodium: 138 mmol/L (ref 135–145)

## 2020-12-28 LAB — MAGNESIUM: Magnesium: 2 mg/dL (ref 1.7–2.4)

## 2020-12-28 MED ORDER — MELATONIN 3 MG PO TABS
3.0000 mg | ORAL_TABLET | Freq: Every day | ORAL | Status: DC
  Administered 2020-12-29 – 2021-01-05 (×9): 3 mg via ORAL
  Filled 2020-12-28 (×9): qty 1

## 2020-12-28 MED ORDER — HALOPERIDOL LACTATE 5 MG/ML IJ SOLN: 2.0000 mg | Freq: Four times a day (QID) | INTRAMUSCULAR | Status: DC | PRN

## 2020-12-28 MED ORDER — FUROSEMIDE 40 MG PO TABS
40.0000 mg | ORAL_TABLET | Freq: Two times a day (BID) | ORAL | Status: DC
  Administered 2020-12-28 – 2020-12-29 (×2): 40 mg via ORAL
  Filled 2020-12-28 (×2): qty 1

## 2020-12-28 MED ORDER — POTASSIUM CHLORIDE CRYS ER 20 MEQ PO TBCR
30.0000 meq | EXTENDED_RELEASE_TABLET | ORAL | Status: AC
  Administered 2020-12-28 (×2): 30 meq via ORAL
  Filled 2020-12-28 (×2): qty 1

## 2020-12-28 NOTE — Progress Notes (Signed)
PROGRESS NOTE    Craig [name]  ZRA:[id number] DOB: 07-16-1940 DOA: 12/22/2020 PCP: [name], MD    Brief Narrative:  Craig [name] is a 80 year old male with past medical history significant for chronic systolic congestive heart failure (EF 25-30% 05/2020), atrial flutter not on anticoagulation, dementia, CAD who presented to [facility] ED via EMS from [facility] SNF due to progressive shortness of breath and lower extremity edema.  Patient unable to contribute to history secondary to his advanced dementia.  Apparently symptoms present for several weeks and was started on Lasix without relief.  In the ED, temperature 97.6 F, HR 82, RR 16, BP 148/107, SPO2 90% on 3 L nasal cannula. (Was reported to be 88% on room air on arrival.)  Sodium 142, potassium 4.0, chloride 108, CO2 23, glucose 136, BUN 18, creatinine 0.93.  WBC 5.9, hemoglobin 13.6, platelets 195.  BNP 3784.  High sensitive troponin 40>41.  SARS-CoV-2/influenza A/B PCR negative.  EKG with atrial flutter, rate 92, QTc 511.  Chest x-ray with cardiac enlargement, patchy left perihilar/right basilar infiltrates versus asymmetrical edema. Patient given 40 mg IV Lasix by EDP.  TRH consulted for further evaluation and management of acute on chronic combined systolic/diastolic congestive heart failure.  Assessment & Plan:   Principal Problem:   Acute respiratory failure with hypoxia (HCC) Active Problems:   Essential hypertension   Atrial flutter (HCC)   Dementia with behavioral disturbance (HCC)   HFrEF (heart failure with reduced ejection fraction) (HCC)   Acute on chronic HFrEF (heart failure with reduced ejection fraction) (HCC)   Acute on chronic heart failure (HCC)   Acute respiratory failure with hypoxia, POA Acute on chronic combined systolic/diastolic congestive heart failure Patient presenting to ED from SNF with progressive shortness of breath and lower extremity edema over the past few weeks; despite  initiation of oral Lasix outpatient.  Patient was hypoxic with SPO2 88% on room air at arrival with elevated BNP and imaging studies suggestive of pulmonary vascular congestion.  TTE with LVEF less than 20%, LV severely decreased function, LV global hypokinesis, mild LVH, LA severely dilated, RA moderately dilated, aortic dilation 37 mm, IVC dilated. --Net negative 739mL p24h with 3 unmeasured urinary occurrences and net negative 10.5L since admission --transition furosemide to 40 mg PO q12h today --started spironolactone 25mg  PO daily --Metoprolol succinate 50 mg p.o. daily --Losartan 25 mg p.o. daily --Placed TED hose --Strict I's and O's and daily weights --Continue supplemental oxygen, maintain SPO2 greater than 92%; now titrated off --BMP daily  Hypokalemia Potassium 3.5, magnesium 2.1 this morning.  Will replete potassium. --Follow electrolytes daily  Essential hypertension BP 119/67 this am. --Started spironolactone 25 mg p.o. daily today --Metoprolol succinate 50 mg p.o. daily --Losartan 25 mg p.o. daily --IV furosemide transitioned to PO today as above --Aspirin 81 mg p.o. daily --Continue monitor BP closely  Atrial flutter Not on anticoagulation due to dementia and frequent falls. --Continue rate control with metoprolol succinate 50 mg p.o. daily --Monitor on telemetry  Dementia --Donepezil 10 mg p.o. nightly --BuSpar 5 mg p.o. 3 times daily  Prolonged QTC EKG on admission with QTC 511. --Avoid QT prolonging medications --Monitor on telemetry  GERD: Continue PPI  Ethics: Patient presenting from SNF with acute on chronic congestive heart failure with worsening EF and significant peripheral edema.  History of dementia. Palliative care for assistance with goals of care and medical decision making as he is likely ending end-stage heart failure.  Family wishes to continue full  code with full scope of treatment with only limitations being no long-term life support and no  long-term feeding tube, no tracheostomy or PEG.  Will need outpatient palliative care on discharge.   DVT prophylaxis: Place TED hose Start: 12/25/20 0910 enoxaparin (LOVENOX) injection 40 mg Start: 12/23/20 1000   Code Status: Full Code Family Communication: No family present at bedside, updated patient's son, Berneta Sages via telephone yesterday  Disposition Plan:  Level of care: Telemetry Cardiac Status is: Inpatient  Remains inpatient appropriate because:IV treatments appropriate due to intensity of illness or inability to take PO and Inpatient level of care appropriate due to severity of illness  Dispo: The patient is from: SNF              Anticipated d/c is to: SNF              Patient currently is not medically stable to d/c.   Difficult to place patient No  Consultants:  None  Procedures:  TTE  Antimicrobials:  None   Subjective: Patient seen examined at bedside, resting comfortably.  Sleeping but easily arousable.  Nursing reports mild agitation overnight, otherwise no issues.  Pleasantly confused this morning eating breakfast.  No specific complaints this morning.  Lower extremity edema much improved.  Denies headache, no chest pain, no palpitations, no abdominal pain.  No other acute concerns overnight per nursing staff.  Objective: Vitals:   12/27/20 1114 12/27/20 2131 12/28/20 0500 12/28/20 0533  BP: 132/76 132/88  119/67  Pulse: (!) 56 (!) 106  75  Resp: 17 18  17   Temp: 97.9 F (36.6 C)   98 F (36.7 C)  TempSrc: Oral   Oral  SpO2: 100% 92%  93%  Weight:   79.4 kg   Height:        Intake/Output Summary (Last 24 hours) at 12/28/2020 1042 Last data filed at 12/28/2020 0900 Gross per 24 hour  Intake 660 ml  Output 850 ml  Net -190 ml   Filed Weights   12/27/20 0014 12/27/20 0500 12/28/20 0500  Weight: 80 kg 80 kg 79.4 kg    Examination:  General exam: Appears calm and comfortable, pleasantly confused Respiratory system: CTAB, normal respiratory  effort without accessory muscle use, on room air with SPO2 96 %. Cardiovascular system: S1 & S2 heard, RRR. No JVD, murmurs, rubs, gallops or clicks.  1+ pitting pedal edema to ,mid shin, TED hose noted in place Gastrointestinal system: Abdomen is nondistended, soft and nontender. No organomegaly or masses felt. Normal bowel sounds heard. Central nervous system: Alert, not oriented to person/place/time or situation. No focal neurological deficits. Extremities: Symmetric 5 x 5 power. Skin: No rashes, lesions or ulcers Psychiatry: Judgement and insight appear poor. Mood & affect appropriate.     Data Reviewed: I have personally reviewed following labs and imaging studies  CBC: Recent Labs  Lab 12/22/20 2359  WBC 5.9  NEUTROABS 4.0  HGB 13.6  HCT 43.2  MCV 104.1*  PLT 130   Basic Metabolic Panel: Recent Labs  Lab 12/24/20 0507 12/25/20 0143 12/26/20 0248 12/27/20 0422 12/28/20 0157  NA 141 139 139 140 138  K 4.7 4.1 3.3* 3.2* 3.5  CL 108 106 101 103 100  CO2 23 27 27 30 30   GLUCOSE 141* 112* 153* 150* 113*  BUN 25* 24* 25* 28* 28*  CREATININE 1.04 0.92 0.88 0.90 0.95  CALCIUM 9.5 9.2 9.3 8.8* 8.9  MG 2.2 2.1 2.0 2.1 2.0   GFR: Estimated Creatinine  Clearance: 72 mL/min (by C-G formula based on SCr of 0.95 mg/dL). Liver Function Tests: No results for input(s): AST, ALT, ALKPHOS, BILITOT, PROT, ALBUMIN in the last 168 hours. No results for input(s): LIPASE, AMYLASE in the last 168 hours. No results for input(s): AMMONIA in the last 168 hours. Coagulation Profile: No results for input(s): INR, PROTIME in the last 168 hours. Cardiac Enzymes: No results for input(s): CKTOTAL, CKMB, CKMBINDEX, TROPONINI in the last 168 hours. BNP (last 3 results) No results for input(s): PROBNP in the last 8760 hours. HbA1C: No results for input(s): HGBA1C in the last 72 hours. CBG: No results for input(s): GLUCAP in the last 168 hours. Lipid Profile: No results for input(s): CHOL,  HDL, LDLCALC, TRIG, CHOLHDL, LDLDIRECT in the last 72 hours. Thyroid Function Tests: No results for input(s): TSH, T4TOTAL, FREET4, T3FREE, THYROIDAB in the last 72 hours. Anemia Panel: No results for input(s): VITAMINB12, FOLATE, FERRITIN, TIBC, IRON, RETICCTPCT in the last 72 hours. Sepsis Labs: No results for input(s): PROCALCITON, LATICACIDVEN in the last 168 hours.  Recent Results (from the past 240 hour(s))  Resp Panel by RT-PCR (Flu A&B, Covid) Nasopharyngeal Swab     Status: None   Collection Time: 12/23/20 12:04 AM   Specimen: Nasopharyngeal Swab; Nasopharyngeal(NP) swabs in vial transport medium  Result Value Ref Range Status   SARS Coronavirus 2 by RT PCR NEGATIVE NEGATIVE Final    Comment: (NOTE) SARS-CoV-2 target nucleic acids are NOT DETECTED.  The SARS-CoV-2 RNA is generally detectable in upper respiratory specimens during the acute phase of infection. The lowest concentration of SARS-CoV-2 viral copies this assay can detect is 138 copies/mL. A negative result does not preclude SARS-Cov-2 infection and should not be used as the sole basis for treatment or other patient management decisions. A negative result may occur with  improper specimen collection/handling, submission of specimen other than nasopharyngeal swab, presence of viral mutation(s) within the areas targeted by this assay, and inadequate number of viral copies(<138 copies/mL). A negative result must be combined with clinical observations, patient history, and epidemiological information. The expected result is Negative.  Fact Sheet for Patients:  EntrepreneurPulse.com.au  Fact Sheet for Healthcare Providers:  IncredibleEmployment.be  This test is no t yet approved or cleared by the [place] FDA and  has been authorized for detection and/or diagnosis of SARS-CoV-2 by FDA under an Emergency Use Authorization (EUA). This EUA will remain  in effect (meaning this  test can be used) for the duration of the COVID-19 declaration under Section 564(b)(1) of the Act, 21 U.S.C.section 360bbb-3(b)(1), unless the authorization is terminated  or revoked sooner.       Influenza A by PCR NEGATIVE NEGATIVE Final   Influenza B by PCR NEGATIVE NEGATIVE Final    Comment: (NOTE) The Xpert Xpress SARS-CoV-2/FLU/RSV plus assay is intended as an aid in the diagnosis of influenza from Nasopharyngeal swab specimens and should not be used as a sole basis for treatment. Nasal washings and aspirates are unacceptable for Xpert Xpress SARS-CoV-2/FLU/RSV testing.  Fact Sheet for Patients: EntrepreneurPulse.com.au  Fact Sheet for Healthcare Providers: IncredibleEmployment.be  This test is not yet approved or cleared by the [place] FDA and has been authorized for detection and/or diagnosis of SARS-CoV-2 by FDA under an Emergency Use Authorization (EUA). This EUA will remain in effect (meaning this test can be used) for the duration of the COVID-19 declaration under Section 564(b)(1) of the Act, 21 U.S.C. section 360bbb-3(b)(1), unless the authorization is terminated or revoked.  Performed  at [facility], [place] [address], [place], [place] [address]          Radiology Studies: No results found.      Scheduled Meds:  aspirin EC  81 mg Oral Daily   busPIRone  5 mg Oral TID   donepezil  10 mg Oral QHS   enoxaparin (LOVENOX) injection  40 mg Subcutaneous Q24H   feeding supplement  237 mL Oral TID BM   furosemide  40 mg Intravenous Q8H   losartan  25 mg Oral Daily   metoprolol succinate  50 mg Oral Daily   multivitamin with minerals  1 tablet Oral Daily   pantoprazole  40 mg Oral Daily   potassium chloride  30 mEq Oral Q3H   sodium chloride flush  3 mL Intravenous Q12H   spironolactone  25 mg Oral Daily   Continuous Infusions:  sodium chloride       LOS: 4 days    Time spent: 37 minutes spent on  chart review, discussion with nursing staff, consultants, updating family and interview/physical exam; more than 50% of that time was spent in counseling and/or coordination of care.    Saloma Cadena J [place] DO [facility] Available via Epic secure chat 7am-7pm After these hours, please refer to coverage provider listed on [email or web address] 12/28/2020, 10:42 AM

## 2020-12-28 NOTE — TOC Progression Note (Signed)
Transition of Care [facility]) - Progression Note    Patient Details  Name: Craig [name] MRN: [id number] Date of Birth: August 19, 1940  Transition of [facility]) CM/SW Contact  Reece Agar, [place] Phone Number: 12/28/2020, 1:58 PM  Clinical Narrative:    0211: CSW contacted [facility] and [facility] for bed availability. No answer at [facility] left a VM. [facility] states that they will review pt and send decision over in the hub.        Expected Discharge Plan and Services                                                 Social Determinants of Health (SDOH) Interventions    Readmission Risk Interventions Readmission Risk Prevention Plan 06/15/2020  Transportation Screening Complete  PCP or Specialist Appt within 3-5 Days Complete  HRI or [place] (No Data)  Social Work Consult for [place] Planning/Counseling [place] Not Applicable  Medication Review Press photographer) Complete  Some recent data might be hidden

## 2020-12-28 NOTE — Progress Notes (Signed)
Physical Therapy Treatment Patient Details Name: Craig [name] MRN: [id number] DOB: 1941-04-14 Today's Date: 12/28/2020    History of Present Illness 80 yo male with onset ofSOB and LE edema was sent from SNF to receive care.  Pt was noted to be in acute resp failure, and CHF acutely.  PMHx:  atrial flutter, CAD, HTN, TIA, CHF, dementia, EF 25-30%    PT Comments    Patient received in bed, generally pleasant throughout session. Patient requires increased time and multimodal cues for mobility due to cognition. He required supervision for bed mobility, min assist for sit to stand. Unable to get him to walk at all. Patient will continue to benefit from skilled PT while here to improve mobility and safety.      Follow Up Recommendations  SNF     Equipment Recommendations  None recommended by PT    Recommendations for Other Services       Precautions / Restrictions Precautions Precautions: Fall Precaution Comments: dementia Restrictions Weight Bearing Restrictions: No    Mobility  Bed Mobility Overal bed mobility: Needs Assistance Bed Mobility: Supine to Sit;Sit to Supine     Supine to sit: Supervision Sit to supine: Supervision        Transfers Overall transfer level: Needs assistance Equipment used: Rolling walker (2 wheeled) Transfers: Sit to/from Stand Sit to Stand: Min guard;+2 safety/equipment         General transfer comment: patient began having BM upon standing. Required cleaning and standing 2-3 times. Patient returned to bed.  Ambulation/Gait             General Gait Details: not able/willing   Stairs             Wheelchair Mobility    Modified Rankin (Stroke Patients Only)       Balance Overall balance assessment: Needs assistance Sitting-balance support: Feet supported Sitting balance-Leahy Scale: Good     Standing balance support: Bilateral upper extremity supported;During functional activity Standing balance-Leahy  Scale: Poor Standing balance comment: patient able to stand with min assist, but difficult to get him to go to recliner. Returned to bed.                            Cognition Arousal/Alertness: Awake/alert Behavior During Therapy: Impulsive Overall Cognitive Status: History of cognitive impairments - at baseline Area of Impairment: Attention;Following commands;Safety/judgement;Awareness;Problem solving                 Orientation Level: Disoriented to;Time;Situation Current Attention Level: Sustained Memory: Decreased recall of precautions;Decreased short-term memory Following Commands: Follows one step commands inconsistently;Follows one step commands with increased time Safety/Judgement: Decreased awareness of safety;Decreased awareness of deficits Awareness: Intellectual Problem Solving: Slow processing;Requires verbal cues;Requires tactile cues General Comments: RN reports patient was agitated this am, mostly calm during session.      Exercises      General Comments        Pertinent Vitals/Pain Pain Assessment: No/denies pain    Home Living                      Prior Function            PT Goals (current goals can now be found in the care plan section) Acute Rehab PT Goals Patient Stated Goal: To go home PT Goal Formulation: With patient Time For Goal Achievement: 01/07/21 Progress towards PT goals: Not progressing toward goals - comment (difficult  to progress due to cognition)    Frequency    Min 3X/week      PT Plan Current plan remains appropriate    Co-evaluation              AM-PAC PT "6 Clicks" Mobility   Outcome Measure  Help needed turning from your back to your side while in a flat bed without using bedrails?: A Little Help needed moving from lying on your back to sitting on the side of a flat bed without using bedrails?: A Little Help needed moving to and from a bed to a chair (including a wheelchair)?: A  Lot Help needed standing up from a chair using your arms (e.g., wheelchair or bedside chair)?: A Little Help needed to walk in hospital room?: Total Help needed climbing 3-5 steps with a railing? : Total 6 Click Score: 13    End of Session Equipment Utilized During Treatment: Gait belt Activity Tolerance: Patient tolerated treatment well Patient left: in bed;with call bell/phone within reach;with bed alarm set Nurse Communication: Mobility status PT Visit Diagnosis: Unsteadiness on feet (R26.81);Muscle weakness (generalized) (M62.81);Other abnormalities of gait and mobility (R26.89);Difficulty in walking, not elsewhere classified (R26.2)     Time: 1030-1055 PT Time Calculation (min) (ACUTE ONLY): 25 min  Charges:  $Therapeutic Activity: 23-37 mins                     Merline Perkin, PT, GCS 12/28/20,11:16 AM

## 2020-12-28 NOTE — Care Management Important Message (Signed)
Important Message  Patient Details  Name: Craig [name] MRN: [id number] Date of Birth: 08-10-1940   Medicare Important Message Given:  Yes     Shelda Altes 12/28/2020, 12:04 PM

## 2020-12-28 NOTE — Progress Notes (Signed)
Pt increasingly aggressive off and on seemingly around meal times, pt is also holding up hand with verbal threats and yelling profanity at staff.  Pt more aggressive when skipping meal or before a meal time MD notified

## 2020-12-29 ENCOUNTER — Other Ambulatory Visit (HOSPITAL_COMMUNITY): Payer: Self-pay

## 2020-12-29 DIAGNOSIS — I5023 Acute on chronic systolic (congestive) heart failure: Secondary | ICD-10-CM | POA: Diagnosis not present

## 2020-12-29 DIAGNOSIS — I4892 Unspecified atrial flutter: Secondary | ICD-10-CM | POA: Diagnosis not present

## 2020-12-29 DIAGNOSIS — J9601 Acute respiratory failure with hypoxia: Secondary | ICD-10-CM | POA: Diagnosis not present

## 2020-12-29 DIAGNOSIS — F0281 Dementia in other diseases classified elsewhere with behavioral disturbance: Secondary | ICD-10-CM | POA: Diagnosis not present

## 2020-12-29 LAB — BASIC METABOLIC PANEL
Anion gap: 11 (ref 5–15)
BUN: 29 mg/dL — ABNORMAL HIGH (ref 8–23)
CO2: 26 mmol/L (ref 22–32)
Calcium: 9.1 mg/dL (ref 8.9–10.3)
Chloride: 102 mmol/L (ref 98–111)
Creatinine, Ser: 0.95 mg/dL (ref 0.61–1.24)
GFR, Estimated: >60 mL/min (ref >60)
Glucose, Bld: 177 mg/dL — ABNORMAL HIGH (ref 70–99)
Potassium: 3.8 mmol/L (ref 3.5–5.1)
Sodium: 139 mmol/L (ref 135–145)

## 2020-12-29 LAB — MAGNESIUM: Magnesium: 2.2 mg/dL (ref 1.7–2.4)

## 2020-12-29 MED ORDER — POTASSIUM CHLORIDE CRYS ER 20 MEQ PO TBCR
40.0000 meq | EXTENDED_RELEASE_TABLET | ORAL | Status: DC
  Administered 2020-12-29: 40 meq via ORAL
  Filled 2020-12-29: qty 2

## 2020-12-29 MED ORDER — FUROSEMIDE 10 MG/ML IJ SOLN
60.0000 mg | Freq: Two times a day (BID) | INTRAMUSCULAR | Status: DC
  Administered 2020-12-29 – 2020-12-30 (×3): 60 mg via INTRAVENOUS
  Filled 2020-12-29 (×3): qty 6

## 2020-12-29 MED ORDER — EMPAGLIFLOZIN 10 MG PO TABS
10.0000 mg | ORAL_TABLET | Freq: Every day | ORAL | Status: DC
  Administered 2020-12-29 – 2021-01-06 (×9): 10 mg via ORAL
  Filled 2020-12-29 (×9): qty 1

## 2020-12-29 NOTE — Progress Notes (Signed)
Cardiac monitor placed back.

## 2020-12-29 NOTE — Progress Notes (Signed)
Per CCMD . Pt had 5 run of vtach. Pt asymptomatic. Refusing full set of vitals.  MD paged to make aware.

## 2020-12-29 NOTE — Progress Notes (Addendum)
Per CCMD pt had 11bt run of vtach @ 1200 and 8bt run of vtach @1230 .  MD aware. Pt is currently in bed. Denies pain. Pt has dementia.

## 2020-12-29 NOTE — TOC Progression Note (Signed)
Transition of Care [facility]) - Progression Note    Patient Details  Name: Craig [name] MRN: [id number] Date of Birth: 23-Nov-1940  Transition of Care [facility]) CM/SW Contact  Reece Agar, [place] Phone Number: 12/29/2020, 6:05 PM  Clinical Narrative:    CSW following, pt not medically ready for DC and pt has no new SNF offers. CSW contacted pt son to provide update about pt not getting any new offers for SNF. Pt son is understanding and will consider pt returning to [place] if he has to. CSW will follow up with facilities in the [place] area again tomorrow.         Expected Discharge Plan and Services                                                 Social Determinants of Health (SDOH) Interventions    Readmission Risk Interventions Readmission Risk Prevention Plan 06/15/2020  Transportation Screening Complete  PCP or Specialist Appt within 3-5 Days Complete  HRI or [place] (No Data)  Social Work Consult for [place] Planning/Counseling [place] Not Applicable  Medication Review Press photographer) Complete  Some recent data might be hidden

## 2020-12-29 NOTE — Progress Notes (Signed)
Per CCMD pt had 11 bt run of vtach. Pt asymptomatic. MD paged to make aware.

## 2020-12-29 NOTE — Progress Notes (Addendum)
PROGRESS NOTE    Craig [name]  ZDG:[id number] DOB: 12/30/1940 DOA: 12/22/2020 PCP: [name], MD    Brief Narrative:  Craig [name] was admitted to the hospital with the working diagnosis of acute hypoxemic respiratory failure due to decompensated systolic heart failure.   80 year old male past medical history for systolic heart failure, atrial flutter, coronary artery disease and dementia who presented with dyspnea and hypoxemia.  He lives in a nursing home, he was noted to have few weeks of worsening dyspnea and lower extremity edema, refractive to furosemide.  On the day of hospitalization his oxygen saturation was 88% on room air, prompting him to be transferred to the ED.  On his initial physical examination his blood pressure was 148/107, heart rate 82, respiratory rate 16, temperature 97.6, oxygen saturation 98% supplemental oxygen.  He had rales in bases bilaterally, heart S1-S2 present, irregularly irregular, soft abdomen, 3+ bilateral lower extremity edema.  Advanced dementia.  Sodium 146, potassium 4.0, chloride 108, bicarb 23, glucose 136, BUN 18, creatinine 0.93, BNP 3784, high sensitive troponin 40-41, white count 5.9, hemoglobin 13.6, hematocrit 43.2, platelets 195. SARS COVID-19 negative.  Chest radiograph with cardiomegaly, hilar vascular congestion and increased reticular markings at the right base.  EKG 92 bpm, left axis deviation, no significant interval prolongation, atrial fibrillation rhythm, poor R wave progression, no significant ST segment or T wave changes.  Patient was diuresed with intravenous furosemide, negative fluid balance achieved with significant improvement of his symptoms.  Further work-up with echocardiography revealed left ventricle ejection fraction less than 20% with left ventricle global hypokinesis.  LA severely dilated, RA moderately dilated.  Plan to transfer back to skilled nursing facility.  Assessment & Plan:   Principal Problem:    Acute respiratory failure with hypoxia (HCC) Active Problems:   Essential hypertension   Atrial flutter (HCC)   Dementia with behavioral disturbance (HCC)   HFrEF (heart failure with reduced ejection fraction) (HCC)   Acute on chronic HFrEF (heart failure with reduced ejection fraction) (HCC)   Acute on chronic heart failure (HCC)   Acute hypoxemic respiratory failure due to systolic heart failure decompensation. HTN with hypertensive emergency on admission.  Urine output over last 24 hrs is 1,375 ml. Clinically continue to have signs of volume overload with positive JVD and lower extremity edema. Warm lower extremities, blood pressure systolic 951 mmHg today.  Telemetry with non sustained VT this am.   Plan to continue diuresis with IV furosemide to target further negative fluids balance.  Continue with losartan and metoprolol. On spironolactone 25 mg daily.  Add empagliflozin for reduced EF.  Check new EKG to follow with Qt interval.   2. Hypokalemia. K is 3,5 with stable renal function with serum cr at 0,95. Continue K correction with Kcl 40 meq x2 doses and follow up renal function in am.   3. Atrial flutter/ atrial fibrillation. Continue rate control with metoprolol. Currently not on anticoagulation due to dementia and fall risk.   4. Dementia. Positive confusion but not agitation. Continue with donepezil and buspirone.    Patient continue to be at high risk for worsening heart failure.   Status is: Inpatient  Remains inpatient appropriate because:IV treatments appropriate due to intensity of illness or inability to take PO  Dispo: The patient is from: SNF              Anticipated d/c is to: SNF              Patient currently  is not medically stable to d/c.   Difficult to place patient No   DVT prophylaxis: Enoxaparin   Code Status:   full  Family Communication:  No family at the bedside     Subjective: Patient continue to be very weak and deconditioned, positive  dyspnea and lower extremity edema, not yet back to his baseline, no nausea or vomiting.   Objective: Vitals:   12/28/20 1944 12/29/20 0349 12/29/20 0700 12/29/20 0755  BP: 107/72 (!) 129/91 (!) 143/91   Pulse: 76 91  84  Resp: 17 16 18    Temp: (!) 97.4 F (36.3 C) 97.6 F (36.4 C)    TempSrc: Oral Oral    SpO2: 90% 92%    Weight:  77.2 kg    Height:        Intake/Output Summary (Last 24 hours) at 12/29/2020 0835 Last data filed at 12/29/2020 0352 Gross per 24 hour  Intake 1060 ml  Output 1375 ml  Net -315 ml   Filed Weights   12/27/20 0500 12/28/20 0500 12/29/20 0349  Weight: 80 kg 79.4 kg 77.2 kg    Examination:   General: deconditioned,. Positive for dyspnea.  Neurology: Awake and alert, non focal  E ENT: no pallor, no icterus, oral mucosa moist Cardiovascular: Positive JVD. S1-S2 present, rhythmic, positive S3 gallop, rubs, or murmurs. ++/+++ pitting bilateral lower extremity edema. Pulmonary: positive breath sounds bilaterally, adequate air movement, no wheezing, or rhonchi, positive bibasilar rales.(Anterior auscultation)  Gastrointestinal. Abdomen soft and non tender Skin. No rashes Musculoskeletal: no joint deformities     Data Reviewed: I have personally reviewed following labs and imaging studies  CBC: Recent Labs  Lab 12/22/20 2359  WBC 5.9  NEUTROABS 4.0  HGB 13.6  HCT 43.2  MCV 104.1*  PLT 756   Basic Metabolic Panel: Recent Labs  Lab 12/24/20 0507 12/25/20 0143 12/26/20 0248 12/27/20 0422 12/28/20 0157  NA 141 139 139 140 138  K 4.7 4.1 3.3* 3.2* 3.5  CL 108 106 101 103 100  CO2 23 27 27 30 30   GLUCOSE 141* 112* 153* 150* 113*  BUN 25* 24* 25* 28* 28*  CREATININE 1.04 0.92 0.88 0.90 0.95  CALCIUM 9.5 9.2 9.3 8.8* 8.9  MG 2.2 2.1 2.0 2.1 2.0   GFR: Estimated Creatinine Clearance: 70 mL/min (by C-G formula based on SCr of 0.95 mg/dL). Liver Function Tests: No results for input(s): AST, ALT, ALKPHOS, BILITOT, PROT, ALBUMIN in the  last 168 hours. No results for input(s): LIPASE, AMYLASE in the last 168 hours. No results for input(s): AMMONIA in the last 168 hours. Coagulation Profile: No results for input(s): INR, PROTIME in the last 168 hours. Cardiac Enzymes: No results for input(s): CKTOTAL, CKMB, CKMBINDEX, TROPONINI in the last 168 hours. BNP (last 3 results) No results for input(s): PROBNP in the last 8760 hours. HbA1C: No results for input(s): HGBA1C in the last 72 hours. CBG: No results for input(s): GLUCAP in the last 168 hours. Lipid Profile: No results for input(s): CHOL, HDL, LDLCALC, TRIG, CHOLHDL, LDLDIRECT in the last 72 hours. Thyroid Function Tests: No results for input(s): TSH, T4TOTAL, FREET4, T3FREE, THYROIDAB in the last 72 hours. Anemia Panel: No results for input(s): VITAMINB12, FOLATE, FERRITIN, TIBC, IRON, RETICCTPCT in the last 72 hours.    Radiology Studies: I have reviewed all of the imaging during this hospital visit personally     Scheduled Meds:  aspirin EC  81 mg Oral Daily   busPIRone  5 mg Oral TID  donepezil  10 mg Oral QHS   enoxaparin (LOVENOX) injection  40 mg Subcutaneous Q24H   feeding supplement  237 mL Oral TID BM   furosemide  40 mg Oral BID   losartan  25 mg Oral Daily   melatonin  3 mg Oral QHS   metoprolol succinate  50 mg Oral Daily   multivitamin with minerals  1 tablet Oral Daily   pantoprazole  40 mg Oral Daily   sodium chloride flush  3 mL Intravenous Q12H   spironolactone  25 mg Oral Daily   Continuous Infusions:  sodium chloride       LOS: 5 days        Caedon Bond Gerome Apley, MD

## 2020-12-29 NOTE — Plan of Care (Signed)
°  Problem: Clinical Measurements: °Goal: Will remain free from infection °Outcome: Progressing °Goal: Respiratory complications will improve °Outcome: Progressing °  °

## 2020-12-29 NOTE — Progress Notes (Addendum)
Pt removed cardiac monitor off.  Refusing to have it put back on MD paged to make aware.

## 2020-12-29 NOTE — Plan of Care (Signed)
  Problem: Clinical Measurements: Goal: Respiratory complications will improve Outcome: Progressing   Problem: Coping: Goal: Level of anxiety will decrease Outcome: Progressing   Problem: Safety: Goal: Ability to remain free from injury will improve Outcome: Progressing   

## 2020-12-30 DIAGNOSIS — I5023 Acute on chronic systolic (congestive) heart failure: Secondary | ICD-10-CM | POA: Diagnosis not present

## 2020-12-30 DIAGNOSIS — F0281 Dementia in other diseases classified elsewhere with behavioral disturbance: Secondary | ICD-10-CM | POA: Diagnosis not present

## 2020-12-30 DIAGNOSIS — I4892 Unspecified atrial flutter: Secondary | ICD-10-CM | POA: Diagnosis not present

## 2020-12-30 DIAGNOSIS — J9601 Acute respiratory failure with hypoxia: Secondary | ICD-10-CM | POA: Diagnosis not present

## 2020-12-30 LAB — BASIC METABOLIC PANEL
Anion gap: 10 (ref 5–15)
BUN: 29 mg/dL — ABNORMAL HIGH (ref 8–23)
CO2: 27 mmol/L (ref 22–32)
Calcium: 8.8 mg/dL — ABNORMAL LOW (ref 8.9–10.3)
Chloride: 101 mmol/L (ref 98–111)
Creatinine, Ser: 1.1 mg/dL (ref 0.61–1.24)
GFR, Estimated: >60 mL/min (ref >60)
Glucose, Bld: 91 mg/dL (ref 70–99)
Potassium: 3.8 mmol/L (ref 3.5–5.1)
Sodium: 138 mmol/L (ref 135–145)

## 2020-12-30 MED ORDER — FUROSEMIDE 40 MG PO TABS
60.0000 mg | ORAL_TABLET | Freq: Two times a day (BID) | ORAL | Status: DC
  Administered 2020-12-30 – 2021-01-04 (×10): 60 mg via ORAL
  Filled 2020-12-30 (×10): qty 1

## 2020-12-30 NOTE — TOC Progression Note (Signed)
Transition of Care [facility]) - Progression Note    Patient Details  Name: Arslan Kier MRN: [id number] Date of Birth: 1940/12/12  [facility]) CM/SW Contact  Reece Agar, [place] Phone Number: 12/30/2020, 4:44 PM  Clinical Narrative:    CSW reached out to [facility] and [facility], both facilities are not able to accept pt. CSW followed up with pt son who is still wanting pt to be moved to a facility closer to [place]. CSW followed up with [facility] and [facility] in [place], neither facility will accept pt insurance [facility]). CSW contacted [place] where pt came from to confirm pt return if pt cannot get a bed offer in another facility. Shirlee Limerick from [facility] shared that pt is a resident and is pending medicaid for LTC. Shirlee Limerick will contact CSW tomorrow with follow up about pt insurance and if he will need a new auth. CSW will follow up with pt family for updates.   Expected Discharge Plan: [place] Barriers to Discharge: Continued Medical Work up  Expected Discharge Plan and Services Expected Discharge Plan: [place] In-house Referral: Clinical Social Work Discharge Planning Services: NA Post Acute Care Choice: [place] Living arrangements for the past 2 months: [place]                                       Social Determinants of Health (SDOH) Interventions    Readmission Risk Interventions Readmission Risk Prevention Plan 06/15/2020  Transportation Screening Complete  PCP or Specialist Appt within 3-5 Days Complete  HRI or [place] (No Data)  Social Work Consult for [place] Planning/Counseling [place] Not Applicable  Medication Review [employer]) Complete  Some recent data might be hidden

## 2020-12-30 NOTE — Progress Notes (Addendum)
PROGRESS NOTE    Craig [name]  HQI:[id number] DOB: 10-Apr-1941 DOA: 12/22/2020 PCP: [name], MD    Brief Narrative:  Craig [name] was admitted to the hospital with the working diagnosis of acute hypoxemic respiratory failure due to decompensated systolic heart failure.    80 year old male past medical history for systolic heart failure, atrial flutter, coronary artery disease and dementia who presented with dyspnea and hypoxemia.  He lives in a nursing home, he was noted to have few weeks of worsening dyspnea and lower extremity edema, refractive to furosemide.  On the day of hospitalization his oxygen saturation was 88% on room air, prompting him to be transferred to the ED.  On his initial physical examination his blood pressure was 148/107, heart rate 82, respiratory rate 16, temperature 97.6, oxygen saturation 98% supplemental oxygen.  He had rales in bases bilaterally, heart S1-S2 present, irregularly irregular, soft abdomen, 3+ bilateral lower extremity edema.  Advanced dementia.   Sodium 146, potassium 4.0, chloride 108, bicarb 23, glucose 136, BUN 18, creatinine 0.93, BNP 3784, high sensitive troponin 40-41, white count 5.9, hemoglobin 13.6, hematocrit 43.2, platelets 195. SARS COVID-19 negative.   Chest radiograph with cardiomegaly, hilar vascular congestion and increased reticular markings at the right base.   EKG 92 bpm, left axis deviation, no significant interval prolongation, atrial fibrillation rhythm, poor R wave progression, no significant ST segment or T wave changes.   Patient was diuresed with intravenous furosemide, negative fluid balance achieved with significant improvement of his symptoms.   Further work-up with echocardiography revealed left ventricle ejection fraction less than 20% with left ventricle global hypokinesis.  LA severely dilated, RA moderately dilated.   Plan to transfer back to skilled nursing facility.   Assessment & Plan:   Principal  Problem:   Acute respiratory failure with hypoxia (HCC) Active Problems:   Essential hypertension   Atrial flutter (HCC)   Dementia with behavioral disturbance (HCC)   HFrEF (heart failure with reduced ejection fraction) (HCC)   Acute on chronic HFrEF (heart failure with reduced ejection fraction) (HCC)   Acute on chronic heart failure (HCC)     Acute hypoxemic respiratory failure due to systolic heart failure decompensation. HTN with hypertensive emergency on admission. Echocardiogram with LV EF less than 20%, severe decreased LV systolic function. Global hypokinesis, moderate reduction in RV systolic function. RSVP 40,8 mmHg. Biatrial dilatation. Moderate functional mitral regurgitation.   Improved volume status this am, his urine output over last 24 hrs is 2,3000 ml. Since admission is 14,476 ml negative fluid balance.    Will transition to oral furosemide in preparation for transfer to SNF  Blood pressure control with losartan and metoprolol. Continue with spironolactone 25 mg daily and empagliflozin for reduced EF.  Follow up EKG with corrected Qtc 469 ms   2. Hypokalemia. Stable renal function with serum cr at 0,95 with K at 3,8 and serum bicarbonate at 26. Continue diuresis.    3. Atrial flutter/ atrial fibrillation. Rate control with metoprolol.  Follow up EKG with rate 75 bpm, left axis deviation, with qtc 469, atrial flutter with variable block, q wave in V1-V2, with poor R wave progression, no st segment or t wave changes.   Currently not on anticoagulation due to dementia and fall risk.   4. Dementia. Yesterday had confusion in the afternoon. At the time of my examination today he is calm and cooperative. Plan to continue with donepezil and buspirone.     Status is: Inpatient  Remains inpatient  appropriate because:Inpatient level of care appropriate due to severity of illness  Dispo: The patient is from: Home              Anticipated d/c is to: SNF               Patient currently is medically stable to d/c.   Difficult to place patient No   DVT prophylaxis: Scd   Code Status:   full  Family Communication:  No family at the bedside. I spoke over the phone with the patient's daughter about patient's  condition, plan of care, prognosis and all questions were addressed.  Family would like patient to have hospice services at discharge, for now continue full code.    Subjective: Patient is feeling better dyspnea continue to improve along with lower extremity edema, no chest pain, No agitation at the time of my examination   Objective: Vitals:   12/29/20 1924 12/30/20 0329 12/30/20 0911 12/30/20 1227  BP: (!) 114/96 131/73 130/88 (!) 130/94  Pulse: 96 83 61 68  Resp: 15 20  17   Temp: 98.2 F (36.8 C) (!) 97.5 F (36.4 C)    TempSrc: Oral Oral    SpO2: 96% 92%  (!) 67%  Weight:  76.1 kg    Height:        Intake/Output Summary (Last 24 hours) at 12/30/2020 1300 Last data filed at 12/30/2020 1058 Gross per 24 hour  Intake 677 ml  Output 3950 ml  Net -3273 ml   Filed Weights   12/28/20 0500 12/29/20 0349 12/30/20 0329  Weight: 79.4 kg 77.2 kg 76.1 kg    Examination:   General: Not in pain or dyspnea, deconditioned  Neurology: Awake and alert, non focal. Positive confusion but not agitation.  E ENT: no pallor, no icterus, oral mucosa moist Cardiovascular: No JVD. S1-S2 present, rhythmic, no gallops, rubs, or murmurs.+ pitting bilateral lower extremity edema. Pulmonary: positive breath sounds bilaterally, adequate air movement, no wheezing, or rhonchi mild rales at bases. Gastrointestinal. Abdomen  soft and non tender Skin. No rashes Musculoskeletal: no joint deformities     Data Reviewed: I have personally reviewed following labs and imaging studies  CBC: No results for input(s): WBC, NEUTROABS, HGB, HCT, MCV, PLT in the last 168 hours. Basic Metabolic Panel: Recent Labs  Lab 12/25/20 0143 12/26/20 0248 12/27/20 0422  12/28/20 0157 12/29/20 1504  NA 139 139 140 138 139  K 4.1 3.3* 3.2* 3.5 3.8  CL 106 101 103 100 102  CO2 27 27 30 30 26   GLUCOSE 112* 153* 150* 113* 177*  BUN 24* 25* 28* 28* 29*  CREATININE 0.92 0.88 0.90 0.95 0.95  CALCIUM 9.2 9.3 8.8* 8.9 9.1  MG 2.1 2.0 2.1 2.0 2.2   GFR: Estimated Creatinine Clearance: 69 mL/min (by C-G formula based on SCr of 0.95 mg/dL). Liver Function Tests: No results for input(s): AST, ALT, ALKPHOS, BILITOT, PROT, ALBUMIN in the last 168 hours. No results for input(s): LIPASE, AMYLASE in the last 168 hours. No results for input(s): AMMONIA in the last 168 hours. Coagulation Profile: No results for input(s): INR, PROTIME in the last 168 hours. Cardiac Enzymes: No results for input(s): CKTOTAL, CKMB, CKMBINDEX, TROPONINI in the last 168 hours. BNP (last 3 results) No results for input(s): PROBNP in the last 8760 hours. HbA1C: No results for input(s): HGBA1C in the last 72 hours. CBG: No results for input(s): GLUCAP in the last 168 hours. Lipid Profile: No results for input(s): CHOL, HDL, LDLCALC,  TRIG, CHOLHDL, LDLDIRECT in the last 72 hours. Thyroid Function Tests: No results for input(s): TSH, T4TOTAL, FREET4, T3FREE, THYROIDAB in the last 72 hours. Anemia Panel: No results for input(s): VITAMINB12, FOLATE, FERRITIN, TIBC, IRON, RETICCTPCT in the last 72 hours.    Radiology Studies: I have reviewed all of the imaging during this hospital visit personally     Scheduled Meds:  aspirin EC  81 mg Oral Daily   busPIRone  5 mg Oral TID   donepezil  10 mg Oral QHS   empagliflozin  10 mg Oral Daily   enoxaparin (LOVENOX) injection  40 mg Subcutaneous Q24H   feeding supplement  237 mL Oral TID BM   furosemide  60 mg Intravenous Q12H   losartan  25 mg Oral Daily   melatonin  3 mg Oral QHS   metoprolol succinate  50 mg Oral Daily   multivitamin with minerals  1 tablet Oral Daily   pantoprazole  40 mg Oral Daily   sodium chloride flush  3 mL  Intravenous Q12H   spironolactone  25 mg Oral Daily   Continuous Infusions:  sodium chloride       LOS: 6 days        Teosha Casso Gerome Apley, MD

## 2020-12-30 NOTE — Progress Notes (Signed)
Physical Therapy Treatment Patient Details Name: Craig [name] MRN: [id number] DOB: 1941/04/01 Today's Date: 12/30/2020    History of Present Illness 80 yo male with onset ofSOB and LE edema was sent from SNF to receive care.  Pt was noted to be in acute resp failure, and CHF acutely.  PMHx:  atrial flutter, CAD, HTN, TIA, CHF, dementia, EF 25-30%    PT Comments    Pt up in chair, RN staff wanting pt back in bed. Pt requiring max +2 for power up to standing, mostly due to pt resistance to mobility. Pt performs squat pivot back to bed much better, with min assist for hips only. Pt tolerated limited LE exercises in bed, limited by cognition/command following. PT to continue to follow.    Follow Up Recommendations  SNF     Equipment Recommendations  None recommended by PT    Recommendations for Other Services       Precautions / Restrictions Precautions Precautions: Fall Precaution Comments: dementia Restrictions Weight Bearing Restrictions: No    Mobility  Bed Mobility Overal bed mobility: Needs Assistance Bed Mobility: Sit to Supine       Sit to supine: Mod assist;+2 for physical assistance   General bed mobility comments: Mod +2 for trunk and LE management, boost up in bed.    Transfers Overall transfer level: Needs assistance Equipment used: 2 person hand held assist Transfers: Sit to/from [employer] Sit to Stand: Max assist;+2 physical assistance   Squat pivot transfers: Min assist;+2 safety/equipment     General transfer comment: max +2 for power up, rise, hip extension to upright, and steadying upon standing. STS x2, does not fully rise either time. Min assist for squat pivot to bed towards L from drop arm recliner, assist for control of placement of buttocks.  Ambulation/Gait                 Stairs             Wheelchair Mobility    Modified Rankin (Stroke Patients Only)       Balance Overall balance  assessment: Needs assistance Sitting-balance support: Feet supported Sitting balance-Leahy Scale: Good     Standing balance support: Bilateral upper extremity supported;During functional activity Standing balance-Leahy Scale: Poor                              Cognition Arousal/Alertness: Awake/alert Behavior During Therapy: Impulsive Overall Cognitive Status: History of cognitive impairments - at baseline Area of Impairment: Attention;Following commands;Safety/judgement;Awareness;Problem solving                 Orientation Level: Disoriented to;Time;Situation;Place Current Attention Level: Sustained Memory: Decreased recall of precautions;Decreased short-term memory Following Commands: Follows one step commands inconsistently;Follows one step commands with increased time Safety/Judgement: Decreased awareness of safety;Decreased awareness of deficits Awareness: Intellectual Problem Solving: Slow processing;Requires verbal cues;Requires tactile cues General Comments: pt not able to state where he is, talking about his mother and father. Pt at times is unintelligible. Pleasant, but can be resistant to mobility stating "wait a minute" every time PT assisted pt to stand.      Exercises General Exercises - Lower Extremity Short Arc Quad: AROM;Both;10 reps;Supine Straight Leg Raises: AROM;Both;10 reps;Supine    General Comments        Pertinent Vitals/Pain Pain Assessment: No/denies pain Pain Intervention(s): Monitored during session    Home Living  Prior Function            PT Goals (current goals can now be found in the care plan section) Acute Rehab PT Goals Patient Stated Goal: To go home PT Goal Formulation: With patient Time For Goal Achievement: 01/07/21 Potential to Achieve Goals: Fair Progress towards PT goals: Progressing toward goals    Frequency    Min 3X/week      PT Plan Current plan remains  appropriate    Co-evaluation              AM-PAC PT "6 Clicks" Mobility   Outcome Measure  Help needed turning from your back to your side while in a flat bed without using bedrails?: A Little Help needed moving from lying on your back to sitting on the side of a flat bed without using bedrails?: A Little Help needed moving to and from a bed to a chair (including a wheelchair)?: A Lot Help needed standing up from a chair using your arms (e.g., wheelchair or bedside chair)?: A Lot Help needed to walk in hospital room?: Total Help needed climbing 3-5 steps with a railing? : Total 6 Click Score: 12    End of Session Equipment Utilized During Treatment: Gait belt Activity Tolerance: Patient tolerated treatment well;Other (comment) (resistant to mobility) Patient left: in bed;with call bell/phone within reach;with bed alarm set;Other (comment) (fall pad placed) Nurse Communication: Mobility status PT Visit Diagnosis: Unsteadiness on feet (R26.81);Muscle weakness (generalized) (M62.81);Other abnormalities of gait and mobility (R26.89);Difficulty in walking, not elsewhere classified (R26.2)     Time: 4446-1901 PT Time Calculation (min) (ACUTE ONLY): 14 min  Charges:  $Therapeutic Activity: 8-22 mins                     Stacie Glaze, PT DPT Acute Rehabilitation Services Pager [phone]  Office [phone]    Craig [name] 12/30/2020, 4:29 PM

## 2020-12-30 NOTE — Progress Notes (Signed)
Pt. Refusing to have labs drawn.

## 2020-12-30 NOTE — Therapy (Signed)
Occupational Therapy Treatment Patient Details Name: Craig [name] MRN: [id number] DOB: [birth year] Today's Date: 12/30/2020    History of present illness 80 yo male with onset ofSOB and LE edema was sent from SNF to receive care.  Pt was noted to be in acute resp failure, and CHF acutely.  PMHx:  atrial flutter, CAD, HTN, TIA, CHF, dementia, EF 25-30%   OT comments  Pt seen this date for ADL retraining session with focus on bed mobility, self feeding, grooming and functional transfers/SPT. Pt is overall Mod A +2 for SPT from EOB to chair. He benefits from multimodal cues and increased time for tasks. Note that pt was observed to pocket food in right cheek when eating but was able to correct when given vc/tc's to swallow and clear cheek before taking another bite. This was also true for grooming after set-up in chair. Cont to recommend SNF/24/7 assist.   Follow Up Recommendations  SNF;Supervision/Assistance - 24 hour    Equipment Recommendations  Other (comment) (defer to next venue)    Recommendations for Other Services      Precautions / Restrictions Precautions Precautions: Fall Precaution Comments: dementia Restrictions Weight Bearing Restrictions: No       Mobility Bed Mobility Overal bed mobility: Needs Assistance Bed Mobility: Supine to Sit     Supine to sit: Supervision;Min guard     General bed mobility comments: with cueing, pt able to come into long sitting position with little physical assist. Multimodal cues to initiate. Pt pulling on therapist arm to power trunk forward.    Transfers Overall transfer level: Needs assistance   Transfers: Sit to/from Stand;Stand Pivot Transfers Sit to Stand: Mod assist;+2 physical assistance;+2 safety/equipment Stand pivot transfers: Mod assist;Max assist;+2 physical assistance;+2 safety/equipment            Balance Overall balance assessment: Needs assistance Sitting-balance support: Feet supported Sitting  balance-Leahy Scale: Good     Standing balance support: Bilateral upper extremity supported;During functional activity Standing balance-Leahy Scale: Poor Standing balance comment:  (Pt was mod-max A +2  for sit to stand from EOB in prep for transfer to recliner. Pt expressed fear of falling, therefore chair brought to pt and SPT performed.)                           ADL either performed or assessed with clinical judgement   ADL Overall ADL's : Needs assistance/impaired Eating/Feeding: Supervision/ safety;Set up;Minimal assistance (VC's secondary to pocketing food in right cheek and need to swallow. Pt alternating between using hands/fingers for pancakes and the fork after therapist placed food on fork ahead of time.) Eating/Feeding Details (indicate cue type and reason): Min vc, tc's Grooming: Moderate assistance Grooming Details (indicate cue type and reason): verbal and tactile cues, demonstration to wash face and hands after set-up while seated.                               General ADL Comments: Pt currently needs +2 assist to safely perform SPT transfers from OEB to chair. Decreased inititation. Fear of falling per pt report. Difficulty performing side step to chair, chair brought to pt for SPT instead.     Vision       Perception     Praxis      Cognition Arousal/Alertness: Awake/alert Behavior During Therapy: Impulsive Overall Cognitive Status: History of cognitive impairments - at baseline Area of Impairment:  Attention;Following commands;Safety/judgement;Awareness;Problem solving                 Orientation Level: Disoriented to;Time;Situation Current Attention Level: Sustained Memory: Decreased recall of precautions;Decreased short-term memory Following Commands: Follows one step commands inconsistently;Follows one step commands with increased time Safety/Judgement: Decreased awareness of safety;Decreased awareness of deficits Awareness:  Intellectual Problem Solving: Slow processing;Requires verbal cues;Requires tactile cues          Exercises     Shoulder Instructions       General Comments      Pertinent Vitals/ Pain       Pain Assessment: Faces Faces Pain Scale: Hurts a little bit Pain Location:  (Pt unable to state, however, facial grimmace when performing sit to stand. Appears stiff) Pain Intervention(s): Limited activity within patient's tolerance;Monitored during session;Repositioned  Home Living                                          Prior Functioning/Environment              Frequency  Min 2X/week        Progress Toward Goals  OT Goals(current goals can now be found in the care plan section)     Acute Rehab OT Goals Patient Stated Goal: Pt unable to state OT Goal Formulation: Patient unable to participate in goal setting Time For Goal Achievement: 01/07/21 Potential to Achieve Goals: [place] Discharge plan remains appropriate;Frequency remains appropriate    Co-evaluation                 AM-PAC OT "6 Clicks" Daily Activity     Outcome Measure   Help from another person eating meals?: A Little Help from another person taking care of personal grooming?: A Little Help from another person toileting, which includes using toliet, bedpan, or urinal?: A Lot Help from another person bathing (including washing, rinsing, drying)?: A Lot Help from another person to put on and taking off regular upper body clothing?: A Lot Help from another person to put on and taking off regular lower body clothing?: A Lot 6 Click Score: 14    End of Session Equipment Utilized During Treatment: Gait belt  OT Visit Diagnosis: Unsteadiness on feet (R26.81);Muscle weakness (generalized) (M62.81)   Activity Tolerance Patient tolerated treatment well   Patient Left in chair;with call bell/phone within reach;with chair alarm set   Nurse Communication Mobility status         Time: 5726-2035 OT Time Calculation (min): 33 min  Charges: OT General Charges $OT Visit: 1 Visit OT Treatments $Self Care/Home Management : 8-22 mins $Therapeutic Activity: 8-22 mins    Bayli Quesinberry Beth Dixon, OTR/L 12/30/2020, 9:04 AM

## 2020-12-30 NOTE — Progress Notes (Signed)
   12/30/20 1424  Vitals  BP 118/72  MAP (mmHg) 86  BP Location Right Arm  BP Method Automatic  Patient Position (if appropriate) Sitting  Pulse Rate 63  Pulse Rate Source Monitor  Resp 18  MEWS COLOR  MEWS Score Color Green  Oxygen Therapy  SpO2 97 %  O2 Device Room Air  MEWS Score  MEWS Temp 0  MEWS Systolic 0  MEWS Pulse 0  MEWS RR 0  MEWS LOC 0  MEWS Score 0   Patient with 22 beat run of Vtach. Patient asymptomatic. Sitting in the chair, resting. Dr. Cathlean Sauer notified via secure chat.

## 2020-12-31 DIAGNOSIS — J9601 Acute respiratory failure with hypoxia: Secondary | ICD-10-CM | POA: Diagnosis not present

## 2020-12-31 DIAGNOSIS — I4892 Unspecified atrial flutter: Secondary | ICD-10-CM | POA: Diagnosis not present

## 2020-12-31 DIAGNOSIS — F0281 Dementia in other diseases classified elsewhere with behavioral disturbance: Secondary | ICD-10-CM | POA: Diagnosis not present

## 2020-12-31 DIAGNOSIS — I5023 Acute on chronic systolic (congestive) heart failure: Secondary | ICD-10-CM | POA: Diagnosis not present

## 2020-12-31 LAB — RESP PANEL BY RT-PCR (FLU A&B, COVID) ARPGX2
Influenza A by PCR: NEGATIVE
Influenza B by PCR: NEGATIVE
SARS Coronavirus 2 by RT PCR: NEGATIVE

## 2020-12-31 LAB — BASIC METABOLIC PANEL
Anion gap: 9 (ref 5–15)
BUN: 34 mg/dL — ABNORMAL HIGH (ref 8–23)
CO2: 28 mmol/L (ref 22–32)
Calcium: 9 mg/dL (ref 8.9–10.3)
Chloride: 100 mmol/L (ref 98–111)
Creatinine, Ser: 0.99 mg/dL (ref 0.61–1.24)
GFR, Estimated: >60 mL/min (ref >60)
Glucose, Bld: 102 mg/dL — ABNORMAL HIGH (ref 70–99)
Potassium: 3.8 mmol/L (ref 3.5–5.1)
Sodium: 137 mmol/L (ref 135–145)

## 2020-12-31 MED ORDER — MELATONIN 3 MG PO TABS: 3.0000 mg | ORAL_TABLET | Freq: Every day | ORAL | 0 refills | Status: DC

## 2020-12-31 MED ORDER — SPIRONOLACTONE 25 MG PO TABS: 25.0000 mg | ORAL_TABLET | Freq: Every day | ORAL | 0 refills | Status: DC

## 2020-12-31 MED ORDER — FUROSEMIDE 20 MG PO TABS
60.0000 mg | ORAL_TABLET | Freq: Every day | ORAL | 0 refills | Status: DC
Start: 2020-12-31 — End: 2021-01-06

## 2020-12-31 MED ORDER — FUROSEMIDE 20 MG PO TABS: 60.0000 mg | ORAL_TABLET | Freq: Two times a day (BID) | ORAL | 0 refills | Status: DC

## 2020-12-31 MED ORDER — EMPAGLIFLOZIN 10 MG PO TABS
10.0000 mg | ORAL_TABLET | Freq: Every day | ORAL | 0 refills | Status: DC
Start: 2021-01-01 — End: 2021-01-06

## 2020-12-31 NOTE — TOC Progression Note (Addendum)
Transition of Care [facility]) - Progression Note    Patient Details  Name: Craig [name] MRN: [id number] Date of Birth: April 25, 1941  [facility] CM/SW Contact  Reece Agar, [place] Phone Number: 12/31/2020, 9:58 AM  Clinical Narrative:    CSW spoke with [place] at [facility] to retreive information about pt offer, they are able to accept pt but is checking on pt insurance for [employer]. CSW contacted pt son to give update on [facility] offer. He would like to discuss it with his sister and follow up with CSW.   28: CSW spoke with pt son who informed Csw that he and his sister would like pt to go to [facility], [place] updated [place] at [facility]. Kenney Houseman stated that they are still waiting on insurance. Pt DC is pending auth, pt has DC summary and COVID test is pending. CSW will continue to follow for DC planing needs.   Expected Discharge Plan: [place] Barriers to Discharge: Continued Medical Work up  Expected Discharge Plan and Services Expected Discharge Plan: [place] In-house Referral: Clinical Social Work Discharge Planning Services: NA Post Acute Care Choice: [place] Living arrangements for the past 2 months: [place] Expected Discharge Date: 12/31/20                                     Social Determinants of Health (SDOH) Interventions    Readmission Risk Interventions Readmission Risk Prevention Plan 06/15/2020  Transportation Screening Complete  PCP or Specialist Appt within 3-5 Days Complete  HRI or [place] (No Data)  Social Work Consult for [place] Planning/Counseling [place] Not Applicable  Medication Review Press photographer) Complete  Some recent data might be hidden

## 2020-12-31 NOTE — Discharge Summary (Addendum)
Physician Discharge Summary  Craig [name] [id number]:[id number] DOB: 01/30/1941 DOA: 12/22/2020  PCP: [name], MD  Admit date: 12/22/2020 Discharge date: 01/03/2021  Admitted From: SNF  Disposition:  SNf with palliative care   Recommendations for Outpatient Follow-up and new medication changes:  Follow up with  Dr. Lennox Grumbles in 7 to 10 days. Furosemide increase to 60 mg daily, increase to twice daily in case of lower extremity edema, dyspnea or weight gain 3 lbs in 24 hrs or 5 lbs in 7 days. Added losartan, spironolactone and empagliflozin  Follow up renal function and electrolytes in 7 days.   Home Health: na   Equipment/Devices: na    Discharge Condition: stable  CODE STATUS: full  Diet recommendation:  heart healthy   Brief/Interim Summary: Craig [name] was admitted to the hospital with the working diagnosis of acute hypoxemic respiratory failure due to decompensated systolic heart failure.    80 year old male past medical history for systolic heart failure, atrial flutter, coronary artery disease and dementia who presented with dyspnea and hypoxemia.  He lives in a nursing home, he was noted to have few weeks of worsening dyspnea and lower extremity edema, refractive to oral furosemide.  On the day of hospitalization his oxygen saturation was 88% on room air, prompting him to be transferred to the ED.  On his initial physical examination his blood pressure was 148/107, heart rate 82, respiratory rate 16, temperature 97.6, oxygen saturation 98% on supplemental oxygen.  He had rales at bases bilaterally, heart S1-S2 present, irregularly irregular, soft abdomen, 3+ bilateral lower extremity edema.  Advanced dementia.   Sodium 146, potassium 4.0, chloride 108, bicarb 23, glucose 136, BUN 18, creatinine 0.93, BNP 3784, high sensitive troponin 40-41, white count 5.9, hemoglobin 13.6, hematocrit 43.2, platelets 195. SARS COVID-19 negative.   Chest radiograph with cardiomegaly, hilar vascular  congestion and increased reticular markings at the right base.   EKG 92 bpm, left axis deviation, no significant interval prolongation, atrial fibrillation rhythm, poor R wave progression, no significant ST segment or T wave changes.   Patient was diuresed with intravenous furosemide, negative fluid balance achieved with significant improvement of his symptoms.   Further work-up with echocardiography revealed left ventricle ejection fraction less than 20% with left ventricle global hypokinesis.  LA severely dilated, RA moderately dilated.   Limited options and poor prognosis due to advanced dementia. His family would like to continue full code but follow up with palliative care services at [facility].  Plan to transfer back to skilled nursing facility.  Acute hypoxemic respiratory failure due to systolic heart failure decompensation.  Hypertensive emergency. Patient was admitted to the cardiac ward, he received intravenous furosemide, negative fluid balance was achieved -15,406 ml since admission with significant improvement of his symptoms.  Further work-up revealed significant reduction on his LV systolic function, less than 20%, global hypokinesis, moderate reduction in RV systolic function, RSVP 44.8 mmHg, bilateral atrial dilatation and moderate functional mitral regurgitation.  Follow-up EKG with no QT prolongation.  Patient will continue heart failure management with losartan, metoprolol, spironolactone and empagliflozin.  He had episodic nonsustained VT's on telemetry monitor.  His prognosis is poor considering his degree of heart failure and baseline dementia.  His family does not want aggressive measures but continue to be full code.  Follow-up as an outpatient with palliative care services.  Continue diuresis with furosemide.  2.  Hypokalemia.  Kidney function remained stable, potassium was corrected with potassium chloride. At his discharge sodium 137, potassium 3.8,  chloride 100,  bicarb 28, glucose 102, BUN 34, creatinine 0.99.  3.  Atrial fibrillation/atrial flutter.  Continue rate control with metoprolol.  Patient not on anticoagulation due to advanced dementia and high fall risk.  4.  Dementia.  Patient will continue with donepezil and buspirone. Condition advanced with poor prognosis.  Discharge Diagnoses:  Principal Problem:   Acute respiratory failure with hypoxia ([place]) Active Problems:   Essential hypertension   Atrial flutter (HCC)   Dementia with behavioral disturbance (HCC)   HFrEF (heart failure with reduced ejection fraction) (HCC)   Acute on chronic HFrEF (heart failure with reduced ejection fraction) ([place])   Acute on chronic heart failure [facility])    Discharge Instructions   Allergies as of 01/03/2021       Reactions   Penicillins         Medication List     STOP taking these medications    LORazepam 0.5 MG tablet Commonly known as: ATIVAN       TAKE these medications    acetaminophen 650 MG CR tablet Commonly known as: TYLENOL Take 650 mg by mouth every 8 (eight) hours as needed for pain.   aspirin EC 81 MG tablet Take 81 mg by mouth daily. Swallow whole.   busPIRone 5 MG tablet Commonly known as: BUSPAR Take 5 mg by mouth 3 (three) times daily.   donepezil 10 MG tablet Commonly known as: ARICEPT Take 10 mg by mouth at bedtime.   empagliflozin 10 MG Tabs tablet Commonly known as: JARDIANCE Take 1 tablet (10 mg total) by mouth daily.   feeding supplement Liqd Take 237 mLs by mouth 3 (three) times daily between meals.   furosemide 20 MG tablet Commonly known as: LASIX Take 3 tablets (60 mg total) by mouth daily. What changed: how much to take   losartan 25 MG tablet Commonly known as: COZAAR Take 1 tablet (25 mg total) by mouth daily.   melatonin 3 MG Tabs tablet Take 1 tablet (3 mg total) by mouth at bedtime.   metoprolol succinate 50 MG 24 hr tablet Commonly known as: TOPROL-XL Take 50 mg by mouth  daily. Take with or immediately following a meal.   multivitamin with minerals Tabs tablet Take 1 tablet by mouth daily.   OVER THE COUNTER MEDICATION 1 application. Apply compression stockings in the morning and remove at bedtime   pantoprazole 40 MG tablet Commonly known as: PROTONIX Take 1 tablet (40 mg total) by mouth daily.   simethicone 80 MG chewable tablet Commonly known as: MYLICON Chew 80 mg by mouth every 6 (six) hours as needed for flatulence.   spironolactone 25 MG tablet Commonly known as: ALDACTONE Take 1 tablet (25 mg total) by mouth daily.        Allergies  Allergen Reactions   Penicillins       Procedures/Studies: DG Chest Portable 1 View  Result Date: 12/23/2020 CLINICAL DATA:  Increasing shortness of breath.  Hypoxia. EXAM: PORTABLE CHEST 1 VIEW COMPARISON:  06/01/2020 FINDINGS: Cardiac enlargement. Left perihilar and right basilar airspace disease. This is most likely to represent multifocal pneumonia although asymmetrical edema could also have this appearance. No pleural effusions. No pneumothorax. Calcification of the aorta. IMPRESSION: Cardiac enlargement. Patchy left perihilar and right basilar infiltrates. Electronically Signed   By: Lucienne Capers M.D.   On: 12/23/2020 00:14   ECHOCARDIOGRAM COMPLETE  Result Date: 12/23/2020    ECHOCARDIOGRAM REPORT   Patient Name:   Craig [name] Date of Exam: 12/23/2020  Medical Rec #:  [id number]            Height:       74.0 in Accession #:    [id number]           Weight:       162.3 lb Date of Birth:  11/06/1940             BSA:          1.989 m Patient Age:    40 years             BP:           149/93 mmHg Patient Gender: M                    HR:           92 bpm. Exam Location:  Inpatient Procedure: 2D Echo, Cardiac Doppler and Color Doppler Indications:    CHF-Acute Systolic K74.25  History:        Patient has prior history of Echocardiogram examinations, most                 recent 06/01/2020. CAD, TIA,  Arrythmias:Atrial Fibrillation;                 Risk Factors:Dyslipidemia and Hypertension.  Sonographer:    Bernadene Person RDCS Referring Phys: [phone] JARED M GARDNER  Sonographer Comments: Image acquisition challenging due to respiratory motion. IMPRESSIONS  1. Left ventricular ejection fraction, by estimation, is <20%. The left ventricle has severely decreased function. The left ventricle demonstrates global hypokinesis. There is mild left ventricular hypertrophy. Left ventricular diastolic parameters are indeterminate.  2. Right ventricular systolic function is moderately reduced. The right ventricular size is normal. There is mildly elevated pulmonary artery systolic pressure. The estimated right ventricular systolic pressure is 87.5 mmHg.  3. Left atrial size was severely dilated.  4. Right atrial size was moderately dilated.  5. The mitral valve is normal in structure. Moderate probably functional mitral valve regurgitation. No evidence of mitral stenosis.  6. The aortic valve is tricuspid. Aortic valve regurgitation is not visualized. No aortic stenosis is present.  7. Aortic dilatation noted. There is mild dilatation of the ascending aorta, measuring 37 mm.  8. The inferior vena cava is dilated in size with <50% respiratory variability, suggesting right atrial pressure of 15 mmHg.  9. The patient was in atrial fibrillation. FINDINGS  Left Ventricle: Left ventricular ejection fraction, by estimation, is <20%. The left ventricle has severely decreased function. The left ventricle demonstrates global hypokinesis. The left ventricular internal cavity size was normal in size. There is mild left ventricular hypertrophy. Left ventricular diastolic parameters are indeterminate. Right Ventricle: The right ventricular size is normal. No increase in right ventricular wall thickness. Right ventricular systolic function is moderately reduced. There is mildly elevated pulmonary artery systolic pressure. The tricuspid  regurgitant velocity is 2.54 m/s, and with an assumed right atrial pressure of 15 mmHg, the estimated right ventricular systolic pressure is 64.3 mmHg. Left Atrium: Left atrial size was severely dilated. Right Atrium: Right atrial size was moderately dilated. Pericardium: Trivial pericardial effusion is present. Mitral Valve: The mitral valve is normal in structure. Moderate mitral valve regurgitation. No evidence of mitral valve stenosis. Tricuspid Valve: The tricuspid valve is normal in structure. Tricuspid valve regurgitation is trivial. Aortic Valve: The aortic valve is tricuspid. Aortic valve regurgitation is not visualized. No aortic stenosis is present. Pulmonic Valve: The pulmonic valve was normal  in structure. Pulmonic valve regurgitation is trivial. Aorta: The aortic root is normal in size and structure and aortic dilatation noted. There is mild dilatation of the ascending aorta, measuring 37 mm. Venous: The inferior vena cava is dilated in size with less than 50% respiratory variability, suggesting right atrial pressure of 15 mmHg. IAS/Shunts: No atrial level shunt detected by color flow Doppler.  LEFT VENTRICLE PLAX 2D LVIDd:         4.90 cm      Diastology LVIDs:         4.50 cm      LV e' medial:    3.94 cm/s LV PW:         1.30 cm      LV E/e' medial:  16.0 LV IVS:        1.30 cm      LV e' lateral:   8.89 cm/s LVOT diam:     2.30 cm      LV E/e' lateral: 7.1 LV SV:         39 LV SV Index:   20 LVOT Area:     4.15 cm  LV Volumes (MOD) LV vol d, MOD A2C: 199.0 ml LV vol d, MOD A4C: 154.0 ml LV vol s, MOD A2C: 141.0 ml LV vol s, MOD A4C: 118.0 ml LV SV MOD A2C:     58.0 ml LV SV MOD A4C:     154.0 ml LV SV MOD BP:      54.1 ml RIGHT VENTRICLE RV S prime:     5.25 cm/s TAPSE (M-mode): 0.9 cm LEFT ATRIUM              Index       RIGHT ATRIUM           Index LA diam:        4.70 cm  2.36 cm/m  RA Area:     25.80 cm LA Vol (A2C):   154.0 ml 77.43 ml/m RA Volume:   75.90 ml  38.16 ml/m LA Vol (A4C):    133.0 ml 66.87 ml/m LA Biplane Vol: 150.0 ml 75.42 ml/m  AORTIC VALVE LVOT Vmax:   63.93 cm/s LVOT Vmean:  39.367 cm/s LVOT VTI:    0.094 m  AORTA Ao Root diam: 3.70 cm Ao Asc diam:  3.70 cm MITRAL VALVE                 TRICUSPID VALVE MV Area (PHT): 2.22 cm      TR Peak grad:   25.8 mmHg MV Decel Time: 342 msec      TR Vmax:        254.00 cm/s MR Peak grad:    98.4 mmHg MR Mean grad:    67.0 mmHg   SHUNTS MR Vmax:         496.00 cm/s Systemic VTI:  0.09 m MR Vmean:        388.0 cm/s  Systemic Diam: 2.30 cm MR PISA:         0.77 cm MR PISA Eff ROA: 6 mm MR PISA Radius:  0.35 cm MV E velocity: 62.90 cm/s MV A velocity: 27.45 cm/s MV E/A ratio:  2.29 Loralie Champagne MD Electronically signed by Loralie Champagne MD Signature Date/Time: 12/23/2020/4:03:19 PM    Final       Subjective: Patient is feeling better, dyspnea and edema have improved, no chest pain.,   Discharge Exam: Vitals:   12/31/20 0437 12/31/20 1042  BP:  119/87 99/62  Pulse: 73 67  Resp: 16 16  Temp: 97.8 F (36.6 C) 98.3 F (36.8 C)  SpO2: 99% 95%   Vitals:   12/30/20 1931 12/31/20 0144 12/31/20 0437 12/31/20 1042  BP: 124/70  119/87 99/62  Pulse: 76  73 67  Resp: 18  16 16   Temp: 97.8 F (36.6 C)  97.8 F (36.6 C) 98.3 F (36.8 C)  TempSrc: Oral  Oral   SpO2: 100%  99% 95%  Weight:  76.2 kg    Height:        General: Not in pain or dyspnea.  Neurology: Awake and alert, non focal  E ENT: no pallor, no icterus, oral mucosa moist Cardiovascular: No JVD. S1-S2 present, rhythmic, no gallops, rubs, or murmurs. Trace lower extremity edema. Pulmonary: positive breath sounds bilaterally, with no wheezing, rhonchi or rales. Gastrointestinal. Abdomen soft and non tender Skin. No rashes Musculoskeletal: no joint deformities   The results of significant diagnostics from this hospitalization (including imaging, microbiology, ancillary and laboratory) are listed below for reference.     Microbiology: Recent Results (from the  past 240 hour(s))  Resp Panel by RT-PCR (Flu A&B, Covid) Nasopharyngeal Swab     Status: None   Collection Time: 12/23/20 12:04 AM   Specimen: Nasopharyngeal Swab; Nasopharyngeal(NP) swabs in vial transport medium  Result Value Ref Range Status   SARS Coronavirus 2 by RT PCR NEGATIVE NEGATIVE Final    Comment: (NOTE) SARS-CoV-2 target nucleic acids are NOT DETECTED.  The SARS-CoV-2 RNA is generally detectable in upper respiratory specimens during the acute phase of infection. The lowest concentration of SARS-CoV-2 viral copies this assay can detect is 138 copies/mL. A negative result does not preclude SARS-Cov-2 infection and should not be used as the sole basis for treatment or other patient management decisions. A negative result may occur with  improper specimen collection/handling, submission of specimen other than nasopharyngeal swab, presence of viral mutation(s) within the areas targeted by this assay, and inadequate number of viral copies(<138 copies/mL). A negative result must be combined with clinical observations, patient history, and epidemiological information. The expected result is Negative.  Fact Sheet for Patients:  EntrepreneurPulse.com.au  Fact Sheet for Healthcare Providers:  IncredibleEmployment.be  This test is no t yet approved or cleared by the [place] FDA and  has been authorized for detection and/or diagnosis of SARS-CoV-2 by FDA under an Emergency Use Authorization (EUA). This EUA will remain  in effect (meaning this test can be used) for the duration of the COVID-19 declaration under Section 564(b)(1) of the Act, 21 U.S.C.section 360bbb-3(b)(1), unless the authorization is terminated  or revoked sooner.       Influenza A by PCR NEGATIVE NEGATIVE Final   Influenza B by PCR NEGATIVE NEGATIVE Final    Comment: (NOTE) The Xpert Xpress SARS-CoV-2/FLU/RSV plus assay is intended as an aid in the diagnosis of  influenza from Nasopharyngeal swab specimens and should not be used as a sole basis for treatment. Nasal washings and aspirates are unacceptable for Xpert Xpress SARS-CoV-2/FLU/RSV testing.  Fact Sheet for Patients: EntrepreneurPulse.com.au  Fact Sheet for Healthcare Providers: IncredibleEmployment.be  This test is not yet approved or cleared by the [place] FDA and has been authorized for detection and/or diagnosis of SARS-CoV-2 by FDA under an Emergency Use Authorization (EUA). This EUA will remain in effect (meaning this test can be used) for the duration of the COVID-19 declaration under Section 564(b)(1) of the Act, 21 U.S.C. section 360bbb-3(b)(1), unless the authorization  is terminated or revoked.  Performed at [facility], [place] [address] [place], [place] [address]      Labs: BNP (last 3 results) Recent Labs    05/26/20 2024 12/22/20 2359 12/25/20 0143  BNP 256.9* 3,784.4* 4,034.7*   Basic Metabolic Panel: Recent Labs  Lab 12/25/20 0143 12/26/20 0248 12/27/20 0422 12/28/20 0157 12/29/20 1504 12/30/20 1538 12/31/20 0357  NA 139 139 140 138 139 138 137  K 4.1 3.3* 3.2* 3.5 3.8 3.8 3.8  CL 106 101 103 100 102 101 100  CO2 27 27 30 30 26 27 28   GLUCOSE 112* 153* 150* 113* 177* 91 102*  BUN 24* 25* 28* 28* 29* 29* 34*  CREATININE 0.92 0.88 0.90 0.95 0.95 1.10 0.99  CALCIUM 9.2 9.3 8.8* 8.9 9.1 8.8* 9.0  MG 2.1 2.0 2.1 2.0 2.2  --   --    Liver Function Tests: No results for input(s): AST, ALT, ALKPHOS, BILITOT, PROT, ALBUMIN in the last 168 hours. No results for input(s): LIPASE, AMYLASE in the last 168 hours. No results for input(s): AMMONIA in the last 168 hours. CBC: No results for input(s): WBC, NEUTROABS, HGB, HCT, MCV, PLT in the last 168 hours. Cardiac Enzymes: No results for input(s): CKTOTAL, CKMB, CKMBINDEX, TROPONINI in the last 168 hours. BNP: Invalid input(s): POCBNP CBG: No results for  input(s): GLUCAP in the last 168 hours. D-Dimer No results for input(s): DDIMER in the last 72 hours. Hgb A1c No results for input(s): HGBA1C in the last 72 hours. Lipid Profile No results for input(s): CHOL, HDL, LDLCALC, TRIG, CHOLHDL, LDLDIRECT in the last 72 hours. Thyroid function studies No results for input(s): TSH, T4TOTAL, T3FREE, THYROIDAB in the last 72 hours.  Invalid input(s): FREET3 Anemia work up No results for input(s): VITAMINB12, FOLATE, FERRITIN, TIBC, IRON, RETICCTPCT in the last 72 hours. Urinalysis    Component Value Date/Time   COLORURINE YELLOW (A) 06/11/2020 1141   APPEARANCEUR CLOUDY (A) 06/11/2020 1141   LABSPEC 1.012 06/11/2020 1141   PHURINE 7.0 06/11/2020 1141   GLUCOSEU NEGATIVE 06/11/2020 1141   HGBUR NEGATIVE 06/11/2020 1141   BILIRUBINUR NEGATIVE 06/11/2020 1141   KETONESUR NEGATIVE 06/11/2020 1141   PROTEINUR NEGATIVE 06/11/2020 1141   NITRITE NEGATIVE 06/11/2020 1141   LEUKOCYTESUR NEGATIVE 06/11/2020 1141   Sepsis Labs Invalid input(s): PROCALCITONIN,  WBC,  LACTICIDVEN Microbiology Recent Results (from the past 240 hour(s))  Resp Panel by RT-PCR (Flu A&B, Covid) Nasopharyngeal Swab     Status: None   Collection Time: 12/23/20 12:04 AM   Specimen: Nasopharyngeal Swab; Nasopharyngeal(NP) swabs in vial transport medium  Result Value Ref Range Status   SARS Coronavirus 2 by RT PCR NEGATIVE NEGATIVE Final    Comment: (NOTE) SARS-CoV-2 target nucleic acids are NOT DETECTED.  The SARS-CoV-2 RNA is generally detectable in upper respiratory specimens during the acute phase of infection. The lowest concentration of SARS-CoV-2 viral copies this assay can detect is 138 copies/mL. A negative result does not preclude SARS-Cov-2 infection and should not be used as the sole basis for treatment or other patient management decisions. A negative result may occur with  improper specimen collection/handling, submission of specimen other than  nasopharyngeal swab, presence of viral mutation(s) within the areas targeted by this assay, and inadequate number of viral copies(<138 copies/mL). A negative result must be combined with clinical observations, patient history, and epidemiological information. The expected result is Negative.  Fact Sheet for Patients:  EntrepreneurPulse.com.au  Fact Sheet for Healthcare Providers:  IncredibleEmployment.be  This test is no t yet approved or cleared by the [place] and  has been authorized for detection and/or diagnosis of SARS-CoV-2 by FDA under an Emergency Use Authorization (EUA). This EUA will remain  in effect (meaning this test can be used) for the duration of the COVID-19 declaration under Section 564(b)(1) of the Act, 21 U.S.C.section 360bbb-3(b)(1), unless the authorization is terminated  or revoked sooner.       Influenza A by PCR NEGATIVE NEGATIVE Final   Influenza B by PCR NEGATIVE NEGATIVE Final    Comment: (NOTE) The Xpert Xpress SARS-CoV-2/FLU/RSV plus assay is intended as an aid in the diagnosis of influenza from Nasopharyngeal swab specimens and should not be used as a sole basis for treatment. Nasal washings and aspirates are unacceptable for Xpert Xpress SARS-CoV-2/FLU/RSV testing.  Fact Sheet for Patients: EntrepreneurPulse.com.au  Fact Sheet for Healthcare Providers: IncredibleEmployment.be  This test is not yet approved or cleared by the [place] FDA and has been authorized for detection and/or diagnosis of SARS-CoV-2 by FDA under an Emergency Use Authorization (EUA). This EUA will remain in effect (meaning this test can be used) for the duration of the COVID-19 declaration under Section 564(b)(1) of the Act, 21 U.S.C. section 360bbb-3(b)(1), unless the authorization is terminated or revoked.  Performed at [facility], [place] [address], [place], [place] [address]       Time coordinating discharge: 45 minutes  SIGNED:   Tawni Millers, MD  [facility] 12/31/2020, 10:57 AM

## 2020-12-31 NOTE — Care Management Important Message (Signed)
Important Message  Patient Details  Name: Craig [name] MRN: [id number] Date of Birth: 1940/11/13   Medicare Important Message Given:  Yes     Shelda Altes 12/31/2020, 8:18 AM

## 2021-01-01 DIAGNOSIS — I4892 Unspecified atrial flutter: Secondary | ICD-10-CM | POA: Diagnosis not present

## 2021-01-01 DIAGNOSIS — F0391 Unspecified dementia with behavioral disturbance: Secondary | ICD-10-CM | POA: Diagnosis not present

## 2021-01-01 DIAGNOSIS — J9601 Acute respiratory failure with hypoxia: Secondary | ICD-10-CM | POA: Diagnosis not present

## 2021-01-01 DIAGNOSIS — I5023 Acute on chronic systolic (congestive) heart failure: Secondary | ICD-10-CM | POA: Diagnosis not present

## 2021-01-01 NOTE — Progress Notes (Signed)
PROGRESS NOTE    Craig [name]  MGQ:[id number] DOB: 11-Nov-1940 DOA: 12/22/2020 PCP: [name], MD    Brief Narrative:  Craig [name] was admitted to the hospital with the working diagnosis of acute hypoxemic respiratory failure due to decompensated systolic heart failure.    80 year old male past medical history for systolic heart failure, atrial flutter, coronary artery disease and dementia who presented with dyspnea and hypoxemia.  He lives in a nursing home, he was noted to have few weeks of worsening dyspnea and lower extremity edema, refractive to oral furosemide.  On the day of hospitalization his oxygen saturation was 88% on room air, prompting him to be transferred to the ED.  On his initial physical examination his blood pressure was 148/107, heart rate 82, respiratory rate 16, temperature 97.6, oxygen saturation 98% on supplemental oxygen.  He had rales at bases bilaterally, heart S1-S2 present, irregularly irregular, soft abdomen, 3+ bilateral lower extremity edema.  Advanced dementia.   Sodium 146, potassium 4.0, chloride 108, bicarb 23, glucose 136, BUN 18, creatinine 0.93, BNP 3784, high sensitive troponin 40-41, white count 5.9, hemoglobin 13.6, hematocrit 43.2, platelets 195. SARS COVID-19 negative.   Chest radiograph with cardiomegaly, hilar vascular congestion and increased reticular markings at the right base.   EKG 92 bpm, left axis deviation, no significant interval prolongation, atrial fibrillation rhythm, poor R wave progression, no significant ST segment or T wave changes.   Patient was diuresed with intravenous furosemide, negative fluid balance achieved with significant improvement of his symptoms.   Further work-up with echocardiography revealed left ventricle ejection fraction less than 20% with left ventricle global hypokinesis.  LA severely dilated, RA moderately dilated.   Limited options and poor prognosis due to advanced dementia. His family would like  to continue full code but follow up with palliative care services at [facility].   Plan to transfer back to skilled nursing facility.   Assessment & Plan:   Principal Problem:   Acute respiratory failure with hypoxia (HCC) Active Problems:   Essential hypertension   Atrial flutter (HCC)   Dementia with behavioral disturbance (HCC)   HFrEF (heart failure with reduced ejection fraction) (HCC)   Acute on chronic HFrEF (heart failure with reduced ejection fraction) (HCC)   Acute on chronic heart failure (HCC)   Acute hypoxemic respiratory failure due to systolic heart failure decompensation.  Hypertensive emergency. Patient was admitted to the cardiac ward, he received intravenous furosemide, negative fluid balance was achieved -15,406 ml since admission with significant improvement of his symptoms.   Further work-up revealed significant reduction on his LV systolic function, less than 20%, global hypokinesis, moderate reduction in RV systolic function, RSVP 26.7 mmHg, bilateral atrial dilatation and moderate functional mitral regurgitation.   Follow-up EKG with no QT prolongation.   Patient will continue heart failure management with losartan, metoprolol, spironolactone and empagliflozin.   He had episodic nonsustained VT's on telemetry monitor. His prognosis is poor considering his degree of heart failure and baseline dementia. His family does not want aggressive measures but continue to be full code.  Follow-up as an outpatient with palliative care services.   Continue diuresis with furosemide.  Patient has remained stable, dyspnea seems to be well controlled, continue diuresis to keep negative fluid balance.    2.  Hypokalemia.  Kidney function remained stable, potassium was corrected with potassium chloride. At his discharge sodium 137, potassium 3.8, chloride 100, bicarb 28, glucose 102, BUN 34, creatinine 0.99.  Continue diuresis with furosemide  3.  Atrial fibrillation/atrial  flutter.  Continue rate control with metoprolol.  Patient not on anticoagulation due to advanced dementia and high fall risk.  Continue rate control.    4.  Dementia.  On donepezil and buspirone. Condition advanced with poor prognosis.   Status is: Inpatient  Remains inpatient appropriate because:Inpatient level of care appropriate due to severity of illness  Dispo: The patient is from: Home              Anticipated d/c is to: SNF              Patient currently is medically stable to d/c.   Difficult to place patient No   DVT prophylaxis: Scd   Code Status:   full  Family Communication:  No family at the bedside     Subjective: Patient is feeling better, no nausea or vomiting, no chest pain, or dyspnea.   Objective: Vitals:   12/31/20 2046 01/01/21 0251 01/01/21 0524 01/01/21 0836  BP: 129/73  113/72 113/67  Pulse: 74  60 (!) 58  Resp: 14  15   Temp: (!) 97.4 F (36.3 C)  97.6 F (36.4 C) 97.7 F (36.5 C)  TempSrc: Oral  Oral Oral  SpO2: 100%  94% 99%  Weight:  74.8 kg    Height:        Intake/Output Summary (Last 24 hours) at 01/01/2021 1344 Last data filed at 01/01/2021 1326 Gross per 24 hour  Intake 420 ml  Output 1420 ml  Net -1000 ml   Filed Weights   12/30/20 0329 12/31/20 0144 01/01/21 0251  Weight: 76.1 kg 76.2 kg 74.8 kg    Examination:   General: Not in pain or dyspnea,  Neurology: Awake and alert, non focal. Positive confusion due to dementia but not agitated.   E ENT: no pallor, no icterus, oral mucosa moist Cardiovascular: No JVD. S1-S2 present, rhythmic, no gallops, rubs, or murmurs. trace lower extremity edema. Pulmonary: positive breath sounds bilaterally, adequate air movement, no wheezing, rhonchi or rales. Gastrointestinal. Abdomen soft abdomen Skin. No rashes Musculoskeletal: no joint deformities     Data Reviewed: I have personally reviewed following labs and imaging studies  CBC: No results for input(s): WBC, NEUTROABS, HGB,  HCT, MCV, PLT in the last 168 hours. Basic Metabolic Panel: Recent Labs  Lab 12/26/20 0248 12/27/20 0422 12/28/20 0157 12/29/20 1504 12/30/20 1538 12/31/20 0357  NA 139 140 138 139 138 137  K 3.3* 3.2* 3.5 3.8 3.8 3.8  CL 101 103 100 102 101 100  CO2 27 30 30 26 27 28   GLUCOSE 153* 150* 113* 177* 91 102*  BUN 25* 28* 28* 29* 29* 34*  CREATININE 0.88 0.90 0.95 0.95 1.10 0.99  CALCIUM 9.3 8.8* 8.9 9.1 8.8* 9.0  MG 2.0 2.1 2.0 2.2  --   --    GFR: Estimated Creatinine Clearance: 65.1 mL/min (by C-G formula based on SCr of 0.99 mg/dL). Liver Function Tests: No results for input(s): AST, ALT, ALKPHOS, BILITOT, PROT, ALBUMIN in the last 168 hours. No results for input(s): LIPASE, AMYLASE in the last 168 hours. No results for input(s): AMMONIA in the last 168 hours. Coagulation Profile: No results for input(s): INR, PROTIME in the last 168 hours. Cardiac Enzymes: No results for input(s): CKTOTAL, CKMB, CKMBINDEX, TROPONINI in the last 168 hours. BNP (last 3 results) No results for input(s): PROBNP in the last 8760 hours. HbA1C: No results for input(s): HGBA1C in the last 72 hours. CBG: No results for  input(s): GLUCAP in the last 168 hours. Lipid Profile: No results for input(s): CHOL, HDL, LDLCALC, TRIG, CHOLHDL, LDLDIRECT in the last 72 hours. Thyroid Function Tests: No results for input(s): TSH, T4TOTAL, FREET4, T3FREE, THYROIDAB in the last 72 hours. Anemia Panel: No results for input(s): VITAMINB12, FOLATE, FERRITIN, TIBC, IRON, RETICCTPCT in the last 72 hours.    Radiology Studies: I have reviewed all of the imaging during this hospital visit personally     Scheduled Meds:  aspirin EC  81 mg Oral Daily   busPIRone  5 mg Oral TID   donepezil  10 mg Oral QHS   empagliflozin  10 mg Oral Daily   enoxaparin (LOVENOX) injection  40 mg Subcutaneous Q24H   feeding supplement  237 mL Oral TID BM   furosemide  60 mg Oral BID   losartan  25 mg Oral Daily   melatonin  3  mg Oral QHS   metoprolol succinate  50 mg Oral Daily   multivitamin with minerals  1 tablet Oral Daily   pantoprazole  40 mg Oral Daily   sodium chloride flush  3 mL Intravenous Q12H   spironolactone  25 mg Oral Daily   Continuous Infusions:  sodium chloride       LOS: 8 days        Dequan Kindred Gerome Apley, MD

## 2021-01-01 NOTE — TOC Progression Note (Signed)
Transition of Care [facility]) - Progression Note    Patient Details  Name: Oneil Behney MRN: [id number] Date of Birth: 04/05/1941  Transition of Care [facility]) CM/SW [place], [place] Phone Number: [phone] 01/01/2021, 9:48 AM  Clinical Narrative:     CSW spoke with Kenney Houseman at [facility] and she stated that the authorization is not back yet. CSW informed Kenney Houseman that if the authorization comes back this weekend to alert CSW.Tonya informed CSW that she does not think that the authorization will come back over the weekend.  TOC team will continue to assist with discharge planning needs.     Expected Discharge Plan: [place] Barriers to Discharge: Continued Medical Work up  Expected Discharge Plan and Services Expected Discharge Plan: [place] In-house Referral: Clinical Social Work Discharge Planning Services: NA Post Acute Care Choice: [place] Living arrangements for the past 2 months: [place] Expected Discharge Date: 12/31/20                                     Social Determinants of Health (SDOH) Interventions    Readmission Risk Interventions Readmission Risk Prevention Plan 06/15/2020  Transportation Screening Complete  PCP or Specialist Appt within 3-5 Days Complete  HRI or [place] (No Data)  Social Work Consult for [place] Planning/Counseling [place] Not Applicable  Medication Review Press photographer) Complete  Some recent data might be hidden

## 2021-01-02 DIAGNOSIS — I4892 Unspecified atrial flutter: Secondary | ICD-10-CM | POA: Diagnosis not present

## 2021-01-02 DIAGNOSIS — J9601 Acute respiratory failure with hypoxia: Secondary | ICD-10-CM | POA: Diagnosis not present

## 2021-01-02 DIAGNOSIS — I10 Essential (primary) hypertension: Secondary | ICD-10-CM | POA: Diagnosis not present

## 2021-01-02 DIAGNOSIS — I5023 Acute on chronic systolic (congestive) heart failure: Secondary | ICD-10-CM | POA: Diagnosis not present

## 2021-01-02 NOTE — Progress Notes (Signed)
PROGRESS NOTE    Craig [name]  UXN:[id number] DOB: March 18, 1941 DOA: 12/22/2020 PCP: [name], MD    Brief Narrative:  Mr. Craig [name] was admitted to the hospital with the working diagnosis of acute hypoxemic respiratory failure due to decompensated systolic heart failure.    80 year old male past medical history for systolic heart failure, atrial flutter, coronary artery disease and dementia who presented with dyspnea and hypoxemia.  He lives in a nursing home, he was noted to have few weeks of worsening dyspnea and lower extremity edema, refractive to oral furosemide.  On the day of hospitalization his oxygen saturation was 88% on room air, prompting him to be transferred to the ED.  On his initial physical examination his blood pressure was 148/107, heart rate 82, respiratory rate 16, temperature 97.6, oxygen saturation 98% on supplemental oxygen.  He had rales at bases bilaterally, heart S1-S2 present, irregularly irregular, soft abdomen, 3+ bilateral lower extremity edema.  Advanced dementia.   Sodium 146, potassium 4.0, chloride 108, bicarb 23, glucose 136, BUN 18, creatinine 0.93, BNP 3784, high sensitive troponin 40-41, white count 5.9, hemoglobin 13.6, hematocrit 43.2, platelets 195. SARS COVID-19 negative.   Chest radiograph with cardiomegaly, hilar vascular congestion and increased reticular markings at the right base.   EKG 92 bpm, left axis deviation, no significant interval prolongation, atrial fibrillation rhythm, poor R wave progression, no significant ST segment or T wave changes.   Patient was diuresed with intravenous furosemide, negative fluid balance achieved with significant improvement of his symptoms.   Further work-up with echocardiography revealed left ventricle ejection fraction less than 20% with left ventricle global hypokinesis.  LA severely dilated, RA moderately dilated.   Limited options and poor prognosis due to advanced dementia. His family would like  to continue full code but follow up with palliative care services at [facility].   Plan to transfer back to skilled nursing facility.   Assessment & Plan:   Principal Problem:   Acute respiratory failure with hypoxia (HCC) Active Problems:   Essential hypertension   Atrial flutter (HCC)   Dementia with behavioral disturbance (HCC)   HFrEF (heart failure with reduced ejection fraction) (HCC)   Acute on chronic HFrEF (heart failure with reduced ejection fraction) (HCC)   Acute on chronic heart failure (HCC)   Acute hypoxemic respiratory failure due to systolic heart failure decompensation.  Hypertensive emergency. Patient was admitted to the cardiac ward, he received intravenous furosemide, negative fluid balance was achieved -15,406 ml since admission with significant improvement of his symptoms.   Further work-up revealed significant reduction on his LV systolic function, less than 20%, global hypokinesis, moderate reduction in RV systolic function, RSVP 25.4 mmHg, bilateral atrial dilatation and moderate functional mitral regurgitation.   Follow-up EKG with no QT prolongation.   Patient will continue heart failure management with losartan, metoprolol, spironolactone and empagliflozin.   He had episodic nonsustained VT's on telemetry monitor. His prognosis is poor considering his degree of heart failure and baseline dementia. His family does not want aggressive measures but continue to be full code.  Follow-up as an outpatient with palliative care services.   Continue to have peripheral edema, will plan to continue current dose of furosemide and follow up on renal function and electrolytes in am.    2.  Hypokalemia.  Kidney function remained stable, potassium was corrected with potassium chloride. At his discharge sodium 137, potassium 3.8, chloride 100, bicarb 28, glucose 102, BUN 34, creatinine 0.99.   Follow up renal panel  in am, continue with furosemide    3.  Atrial  fibrillation/atrial flutter.  Continue rate control with metoprolol.  Patient not on anticoagulation due to advanced dementia and high fall risk.   Continue rate control with metoprolol. Continue to hold on anticoagulation due to fall risk.     4.  Dementia.  Continue with donepezil and buspirone. Condition advanced with poor prognosis.   Status is: Inpatient  Remains inpatient appropriate because:Inpatient level of care appropriate due to severity of illness  Dispo: The patient is from: Home              Anticipated d/c is to: SNF              Patient currently is not medically stable to d/c.   Difficult to place patient No   DVT prophylaxis: Scd   Code Status:   full  Family Communication:  No family at the bedside     Subjective: Patient continue to feel well, no nausea or vomiting, no dyspnea or chest pain,. Limited history due to dementia.   Objective: Vitals:   01/01/21 2008 01/02/21 0430 01/02/21 0616 01/02/21 1048  BP: 137/84  108/75 117/89  Pulse: (!) 58  (!) 55 (!) 53  Resp: 18  17 18   Temp: 97.9 F (36.6 C)  98.2 F (36.8 C) 97.6 F (36.4 C)  TempSrc: Oral  Oral Oral  SpO2: 98%  100% 95%  Weight:  74.1 kg    Height:        Intake/Output Summary (Last 24 hours) at 01/02/2021 1441 Last data filed at 01/02/2021 1761 Gross per 24 hour  Intake 360 ml  Output 500 ml  Net -140 ml   Filed Weights   12/31/20 0144 01/01/21 0251 01/02/21 0430  Weight: 76.2 kg 74.8 kg 74.1 kg    Examination:   General: Not in pain or dyspnea, deconditioned  Neurology: Awake and alert, non focal  E ENT: mid pallor, no icterus, oral mucosa moist Cardiovascular: No JVD. S1-S2 present, rhythmic, no gallops, rubs, or murmurs. + pitting bilateral lower extremity edema. Pulmonary: positive breath sounds bilaterally, with no wheezing, rhonchi or rales. Gastrointestinal. Abdomen soft and non tender Skin. No rashes Musculoskeletal: no joint deformities     Data Reviewed: I have  personally reviewed following labs and imaging studies  CBC: No results for input(s): WBC, NEUTROABS, HGB, HCT, MCV, PLT in the last 168 hours. Basic Metabolic Panel: Recent Labs  Lab 12/27/20 0422 12/28/20 0157 12/29/20 1504 12/30/20 1538 12/31/20 0357  NA 140 138 139 138 137  K 3.2* 3.5 3.8 3.8 3.8  CL 103 100 102 101 100  CO2 30 30 26 27 28   GLUCOSE 150* 113* 177* 91 102*  BUN 28* 28* 29* 29* 34*  CREATININE 0.90 0.95 0.95 1.10 0.99  CALCIUM 8.8* 8.9 9.1 8.8* 9.0  MG 2.1 2.0 2.2  --   --    GFR: Estimated Creatinine Clearance: 64.5 mL/min (by C-G formula based on SCr of 0.99 mg/dL). Liver Function Tests: No results for input(s): AST, ALT, ALKPHOS, BILITOT, PROT, ALBUMIN in the last 168 hours. No results for input(s): LIPASE, AMYLASE in the last 168 hours. No results for input(s): AMMONIA in the last 168 hours. Coagulation Profile: No results for input(s): INR, PROTIME in the last 168 hours. Cardiac Enzymes: No results for input(s): CKTOTAL, CKMB, CKMBINDEX, TROPONINI in the last 168 hours. BNP (last 3 results) No results for input(s): PROBNP in the last 8760 hours. HbA1C: No  results for input(s): HGBA1C in the last 72 hours. CBG: No results for input(s): GLUCAP in the last 168 hours. Lipid Profile: No results for input(s): CHOL, HDL, LDLCALC, TRIG, CHOLHDL, LDLDIRECT in the last 72 hours. Thyroid Function Tests: No results for input(s): TSH, T4TOTAL, FREET4, T3FREE, THYROIDAB in the last 72 hours. Anemia Panel: No results for input(s): VITAMINB12, FOLATE, FERRITIN, TIBC, IRON, RETICCTPCT in the last 72 hours.    Radiology Studies: I have reviewed all of the imaging during this hospital visit personally     Scheduled Meds:  aspirin EC  81 mg Oral Daily   busPIRone  5 mg Oral TID   donepezil  10 mg Oral QHS   empagliflozin  10 mg Oral Daily   enoxaparin (LOVENOX) injection  40 mg Subcutaneous Q24H   feeding supplement  237 mL Oral TID BM   furosemide  60  mg Oral BID   losartan  25 mg Oral Daily   melatonin  3 mg Oral QHS   metoprolol succinate  50 mg Oral Daily   multivitamin with minerals  1 tablet Oral Daily   pantoprazole  40 mg Oral Daily   sodium chloride flush  3 mL Intravenous Q12H   spironolactone  25 mg Oral Daily   Continuous Infusions:  sodium chloride       LOS: 9 days        Shacarra Choe Gerome Apley, MD

## 2021-01-03 LAB — BASIC METABOLIC PANEL
Anion gap: 7 (ref 5–15)
BUN: 30 mg/dL — ABNORMAL HIGH (ref 8–23)
CO2: 27 mmol/L (ref 22–32)
Calcium: 8.7 mg/dL — ABNORMAL LOW (ref 8.9–10.3)
Chloride: 102 mmol/L (ref 98–111)
Creatinine, Ser: 1.04 mg/dL (ref 0.61–1.24)
GFR, Estimated: >60 mL/min (ref >60)
Glucose, Bld: 109 mg/dL — ABNORMAL HIGH (ref 70–99)
Potassium: 3.7 mmol/L (ref 3.5–5.1)
Sodium: 136 mmol/L (ref 135–145)

## 2021-01-03 MED ORDER — POTASSIUM CHLORIDE CRYS ER 20 MEQ PO TBCR: 20.0000 meq | EXTENDED_RELEASE_TABLET | Freq: Every day | ORAL | Status: DC

## 2021-01-03 MED ORDER — POTASSIUM CHLORIDE CRYS ER 20 MEQ PO TBCR
40.0000 meq | EXTENDED_RELEASE_TABLET | Freq: Once | ORAL | Status: DC
  Filled 2021-01-03: qty 2

## 2021-01-03 MED ORDER — POTASSIUM CHLORIDE CRYS ER 20 MEQ PO TBCR
40.0000 meq | EXTENDED_RELEASE_TABLET | Freq: Once | ORAL | Status: AC
  Administered 2021-01-03: 40 meq via ORAL
  Filled 2021-01-03: qty 2

## 2021-01-03 NOTE — Care Management Important Message (Signed)
Important Message  Patient Details  Name: Craig [name] MRN: [id number] Date of Birth: 17-Dec-1940   Medicare Important Message Given:  Yes     Shelda Altes 01/03/2021, 9:49 AM

## 2021-01-03 NOTE — Progress Notes (Signed)
Patient is feeling better, dyspnea and lower extremity continue to improve.   Vitals signs have been stable. BP 114/57, HR 75, RR17 and oxygenation 99%.  Lungs with no wheezing or rales Heart S1 and S2 present with no gallops Abdomen is soft and non tender Trace lower extremity edema.  Plan to transfer to SNF today.

## 2021-01-03 NOTE — TOC Progression Note (Addendum)
Transition of Care [facility]) - Progression Note    Patient Details  Name: Craig [name] MRN: [id number] Date of Birth: 02-08-1941  Transition of Care [facility]) CM/SW Contact  Reece Agar, [place] Phone Number: 01/03/2021, 10:35 AM  Clinical Narrative:    8288: CSW spoke with [place] with [place] to follow up on pt auth, Kenney Houseman stated that she has not heard anything back from [facility] but will follow up. CSW informed Kenney Houseman that pt is medically ready for DC, Kenney Houseman will follow up with CSW.  3374: Auth still pending per Kenney Houseman at [facility]   Expected Discharge Plan: [place] Barriers to Discharge: Continued Medical Work up  Expected Discharge Plan and Services Expected Discharge Plan: [place] In-house Referral: Clinical Social Work Discharge Planning Services: NA Post Acute Care Choice: [place] Living arrangements for the past 2 months: [place] Expected Discharge Date: 01/03/21                                     Social Determinants of Health (SDOH) Interventions    Readmission Risk Interventions Readmission Risk Prevention Plan 06/15/2020  Transportation Screening Complete  PCP or Specialist Appt within 3-5 Days Complete  HRI or [place] (No Data)  Social Work Consult for [place] Planning/Counseling [place] Not Applicable  Medication Review Press photographer) Complete  Some recent data might be hidden

## 2021-01-03 NOTE — Progress Notes (Signed)
Physical Therapy Treatment Patient Details Name: Craig [name] MRN: [id number] DOB: [birth year] Today's Date: 01/03/2021    History of Present Illness 80 yo male with onset of SOB and LE edema admitted from SNF on 7/8.  Pt was noted to be in acute resp failure, and CHF acutely.  PMHx:  atrial flutter, CAD, HTN, TIA, CHF, dementia, EF 25-30%    PT Comments    Pt soiled in stool and urine upon PT arrival to room, requiring mod assist for clean up and rolling. Pt tolerated moving to/from EOB with mod-max PT truncal and LE assist, pt sat EOB x1 minute before fatiguing. Pt very pleasant throughout session. Will continue to follow.     Follow Up Recommendations  SNF     Equipment Recommendations  None recommended by PT    Recommendations for Other Services       Precautions / Restrictions Precautions Precautions: Fall Precaution Comments: dementia Restrictions Weight Bearing Restrictions: No    Mobility  Bed Mobility Overal bed mobility: Needs Assistance Bed Mobility: Supine to Sit;Sit to Supine;Rolling Rolling: Mod assist   Supine to sit: Max assist Sit to supine: Mod assist   General bed mobility comments: mod assist for rolling bilaterally for trunk and LE management, pericare performed by PT as pt soiled in stool. Mod-max assist for supine<>sit for trunk and LE management, scooting to EOB. Pt quickly returning trunk to bed after sitting EOB, PT assisting with LEs.    Transfers                 General transfer comment: NT - pt terminated EOB activity after ~1 minute  Ambulation/Gait                 Stairs             Wheelchair Mobility    Modified Rankin (Stroke Patients Only)       Balance Overall balance assessment: Needs assistance Sitting-balance support: Feet supported Sitting balance-Leahy Scale: Fair         Standing balance comment: NT today                            Cognition Arousal/Alertness:  Awake/alert Behavior During Therapy: Impulsive Overall Cognitive Status: History of cognitive impairments - at baseline Area of Impairment: Attention;Following commands;Safety/judgement;Awareness;Problem solving;Orientation;Memory                 Orientation Level: Disoriented to;Time;Situation;Place Current Attention Level: Sustained Memory: Decreased recall of precautions;Decreased short-term memory Following Commands: Follows one step commands inconsistently;Follows one step commands with increased time Safety/Judgement: Decreased awareness of safety;Decreased awareness of deficits Awareness: Intellectual Problem Solving: Slow processing;Requires verbal cues;Requires tactile cues General Comments: Pt at times makes sense, other times statements appear random. Pt smiling and pleasant throughout session      Exercises      General Comments        Pertinent Vitals/Pain Pain Assessment: Faces Faces Pain Scale: No hurt Pain Intervention(s): Monitored during session    Home Living                      Prior Function            PT Goals (current goals can now be found in the care plan section) Acute Rehab PT Goals Patient Stated Goal: To go home PT Goal Formulation: With patient Time For Goal Achievement: 01/07/21 Potential to Achieve Goals: Fair Progress towards  PT goals: Progressing toward goals    Frequency    Min 2X/week      PT Plan Current plan remains appropriate    Co-evaluation              AM-PAC PT "6 Clicks" Mobility   Outcome Measure  Help needed turning from your back to your side while in a flat bed without using bedrails?: A Little Help needed moving from lying on your back to sitting on the side of a flat bed without using bedrails?: A Little Help needed moving to and from a bed to a chair (including a wheelchair)?: A Lot Help needed standing up from a chair using your arms (e.g., wheelchair or bedside chair)?: A Lot Help  needed to walk in hospital room?: Total Help needed climbing 3-5 steps with a railing? : Total 6 Click Score: 12    End of Session   Activity Tolerance: Patient tolerated treatment well Patient left: in bed;with call bell/phone within reach;with bed alarm set;Other (comment) (fall pad placed) Nurse Communication: Mobility status PT Visit Diagnosis: Unsteadiness on feet (R26.81);Muscle weakness (generalized) (M62.81);Other abnormalities of gait and mobility (R26.89);Difficulty in walking, not elsewhere classified (R26.2)     Time: 1610-9604 PT Time Calculation (min) (ACUTE ONLY): 21 min  Charges:  $Therapeutic Activity: 8-22 mins                     Stacie Glaze, PT DPT Acute Rehabilitation Services Pager [phone]  Office [phone]    Roxine Caddy E Ruffin Pyo 01/03/2021, 3:55 PM

## 2021-01-04 DIAGNOSIS — I5020 Unspecified systolic (congestive) heart failure: Secondary | ICD-10-CM | POA: Diagnosis not present

## 2021-01-04 DIAGNOSIS — I10 Essential (primary) hypertension: Secondary | ICD-10-CM | POA: Diagnosis not present

## 2021-01-04 DIAGNOSIS — I4892 Unspecified atrial flutter: Secondary | ICD-10-CM | POA: Diagnosis not present

## 2021-01-04 DIAGNOSIS — J9601 Acute respiratory failure with hypoxia: Secondary | ICD-10-CM | POA: Diagnosis not present

## 2021-01-04 MED ORDER — FUROSEMIDE 40 MG PO TABS
60.0000 mg | ORAL_TABLET | Freq: Every day | ORAL | Status: DC
  Administered 2021-01-05 – 2021-01-06 (×2): 60 mg via ORAL
  Filled 2021-01-04 (×2): qty 1

## 2021-01-04 NOTE — Progress Notes (Signed)
PROGRESS NOTE    Craig [name]  DJS:[id number] DOB: 07-Jan-1941 DOA: 12/22/2020 PCP: [name], MD    Brief Narrative:  Craig [name] was admitted to the hospital with the working diagnosis of acute hypoxemic respiratory failure due to decompensated systolic heart failure.    80 year old male past medical history for systolic heart failure, atrial flutter, coronary artery disease and dementia who presented with dyspnea and hypoxemia.  He lives in a nursing home, he was noted to have few weeks of worsening dyspnea and lower extremity edema, refractive to oral furosemide.  On the day of hospitalization his oxygen saturation was 88% on room air, prompting him to be transferred to the ED.  On his initial physical examination his blood pressure was 148/107, heart rate 82, respiratory rate 16, temperature 97.6, oxygen saturation 98% on supplemental oxygen.  He had rales at bases bilaterally, heart S1-S2 present, irregularly irregular, soft abdomen, 3+ bilateral lower extremity edema.  Advanced dementia.   Sodium 146, potassium 4.0, chloride 108, bicarb 23, glucose 136, BUN 18, creatinine 0.93, BNP 3784, high sensitive troponin 40-41, white count 5.9, hemoglobin 13.6, hematocrit 43.2, platelets 195. SARS COVID-19 negative.   Chest radiograph with cardiomegaly, hilar vascular congestion and increased reticular markings at the right base.   EKG 92 bpm, left axis deviation, no significant interval prolongation, atrial fibrillation rhythm, poor R wave progression, no significant ST segment or T wave changes.   Patient was diuresed with intravenous furosemide, negative fluid balance achieved with significant improvement of his symptoms.   Further work-up with echocardiography revealed left ventricle ejection fraction less than 20% with left ventricle global hypokinesis.  LA severely dilated, RA moderately dilated.   Limited options and poor prognosis due to advanced dementia. His family would like  to continue full code but follow up with palliative care services at [facility].   Plan to transfer back to skilled nursing facility. Patient is medically stable for discharge but waiting for insurance authorization.      Assessment & Plan:   Principal Problem:   Acute respiratory failure with hypoxia (HCC) Active Problems:   Essential hypertension   Atrial flutter (HCC)   Dementia with behavioral disturbance (HCC)   HFrEF (heart failure with reduced ejection fraction) (HCC)   Acute on chronic HFrEF (heart failure with reduced ejection fraction) (HCC)   Acute on chronic heart failure (HCC)    Acute hypoxemic respiratory failure due to systolic heart failure decompensation.  Hypertensive emergency. Patient was admitted to the cardiac ward, he received intravenous furosemide, negative fluid balance was achieved -15,406 ml since admission with significant improvement of his symptoms.   Further work-up revealed significant reduction on his LV systolic function, less than 20%, global hypokinesis, moderate reduction in RV systolic function, RSVP 88.5 mmHg, bilateral atrial dilatation and moderate functional mitral regurgitation.   Follow-up EKG with no QT prolongation.   Patient will continue heart failure management with losartan, metoprolol, spironolactone and empagliflozin.   He had episodic nonsustained VT's on telemetry monitor. His prognosis is poor considering his degree of heart failure and baseline dementia. His family does not want aggressive measures but continue to be full code.  Follow-up as an outpatient with palliative care services.   Will change furosemide to 60 mg daily and will check renal panel in am. His volume continue to improve.    2.  Hypokalemia.  Kidney function remained stable, potassium was corrected with potassium chloride. At his discharge sodium 137, potassium 3.8, chloride 100, bicarb 28, glucose  102, BUN 34, creatinine 0.99.   Tolerating well diuresis will  plan to follow up renal function in am, may need Kcl supplementation at discharge.    3.  Atrial fibrillation/atrial flutter.  Continue rate control with metoprolol.  Patient not on anticoagulation due to advanced dementia and high fall risk.   Rate is controlled, continue to hold on anticoagulation due to fall risk, in the setting of advance dementia.      4.  Dementia.  On donepezil and buspirone. Advanced disease with poor prognosis, his family has filled the forms for advance directives, but still want patient to be full code.   Status is: Inpatient  Remains inpatient appropriate because: pending insurance authorization for SNF   Dispo: The patient is from: Home              Anticipated d/c is to: SNF              Patient currently is not medically stable to d/c.   Difficult to place patient No   DVT prophylaxis: Scd   Code Status:    full  Family Communication:  No family at the bedside     Subjective: Patient with no chest pain or dyspnea, no nausea or vomiting.   Objective: Vitals:   01/04/21 0500 01/04/21 0521 01/04/21 0759 01/04/21 1149  BP:  (!) 158/138 124/81 117/78  Pulse:  65 (!) 59 (!) 53  Resp:  17    Temp:  98.1 F (36.7 C) (!) 97.5 F (36.4 C)   TempSrc:  Oral Oral   SpO2:  100% 98% 98%  Weight: 74.4 kg     Height:        Intake/Output Summary (Last 24 hours) at 01/04/2021 1251 Last data filed at 01/04/2021 0841 Gross per 24 hour  Intake 1440 ml  Output 2250 ml  Net -810 ml   Filed Weights   01/03/21 0500 01/04/21 0056 01/04/21 0500  Weight: 76.3 kg 74.4 kg 74.4 kg    Examination:   General: Not in pain or dyspnea  Neurology: Awake and alert, non focal, positive confusion and disorientation but not agitation.  E ENT: mild pallor, no icterus, oral mucosa moist Cardiovascular: No JVD. S1-S2 present, rhythmic, no gallops, rubs, or murmurs. Trace lower extremity edema. Pulmonary: positive breath sounds bilaterally, adequate air movement, no  wheezing, rhonchi or rales. Gastrointestinal. Abdomen soft and non tender Skin. No rashes Musculoskeletal: no joint deformities     Data Reviewed: I have personally reviewed following labs and imaging studies  CBC: No results for input(s): WBC, NEUTROABS, HGB, HCT, MCV, PLT in the last 168 hours. Basic Metabolic Panel: Recent Labs  Lab 12/29/20 1504 12/30/20 1538 12/31/20 0357 01/03/21 0344  NA 139 138 137 136  K 3.8 3.8 3.8 3.7  CL 102 101 100 102  CO2 26 27 28 27   GLUCOSE 177* 91 102* 109*  BUN 29* 29* 34* 30*  CREATININE 0.95 1.10 0.99 1.04  CALCIUM 9.1 8.8* 9.0 8.7*  MG 2.2  --   --   --    GFR: Estimated Creatinine Clearance: 61.6 mL/min (by C-G formula based on SCr of 1.04 mg/dL). Liver Function Tests: No results for input(s): AST, ALT, ALKPHOS, BILITOT, PROT, ALBUMIN in the last 168 hours. No results for input(s): LIPASE, AMYLASE in the last 168 hours. No results for input(s): AMMONIA in the last 168 hours. Coagulation Profile: No results for input(s): INR, PROTIME in the last 168 hours. Cardiac Enzymes: No results for  input(s): CKTOTAL, CKMB, CKMBINDEX, TROPONINI in the last 168 hours. BNP (last 3 results) No results for input(s): PROBNP in the last 8760 hours. HbA1C: No results for input(s): HGBA1C in the last 72 hours. CBG: No results for input(s): GLUCAP in the last 168 hours. Lipid Profile: No results for input(s): CHOL, HDL, LDLCALC, TRIG, CHOLHDL, LDLDIRECT in the last 72 hours. Thyroid Function Tests: No results for input(s): TSH, T4TOTAL, FREET4, T3FREE, THYROIDAB in the last 72 hours. Anemia Panel: No results for input(s): VITAMINB12, FOLATE, FERRITIN, TIBC, IRON, RETICCTPCT in the last 72 hours.    Radiology Studies: I have reviewed all of the imaging during this hospital visit personally     Scheduled Meds:  aspirin EC  81 mg Oral Daily   busPIRone  5 mg Oral TID   donepezil  10 mg Oral QHS   empagliflozin  10 mg Oral Daily    enoxaparin (LOVENOX) injection  40 mg Subcutaneous Q24H   feeding supplement  237 mL Oral TID BM   [START ON 01/05/2021] furosemide  60 mg Oral Daily   losartan  25 mg Oral Daily   melatonin  3 mg Oral QHS   metoprolol succinate  50 mg Oral Daily   multivitamin with minerals  1 tablet Oral Daily   pantoprazole  40 mg Oral Daily   sodium chloride flush  3 mL Intravenous Q12H   spironolactone  25 mg Oral Daily   Continuous Infusions:  sodium chloride       LOS: 11 days        Sukaina Toothaker Gerome Apley, MD

## 2021-01-04 NOTE — Therapy (Signed)
Occupational Therapy Treatment Patient Details Name: Craig [name] MRN: [id number] DOB: [birth year] Today's Date: 01/04/2021    History of present illness 80 yo male with onset of SOB and LE edema admitted from SNF on 7/8.  Pt was noted to be in acute resp failure, and CHF acutely.  PMHx:  atrial flutter, CAD, HTN, TIA, CHF, dementia, EF 25-30%   OT comments  Pt seen for OT treatment session today with focus on grooming and upper body bathe and dressing at bed level w/ HOB elevated, pt declined sitting up at EOB. Pt with increased confusion today but was overall Mod-max assist for grooming and upper body bathe/dressing with both verbal and tactile cues as well as demonstration and increased time/repetition for tasks. Plan for SNF/24/7 assist remains appropriate.   Follow Up Recommendations  SNF;Supervision/Assistance - 24 hour    Equipment Recommendations  Other (comment) (Defer to next venue)    Recommendations for Other Services      Precautions / Restrictions Precautions Precautions: Fall Precaution Comments: dementia Restrictions Weight Bearing Restrictions: No       Mobility Bed Mobility Overal bed mobility: Needs Assistance     General bed mobility comments: Mod assist to reposition in bed after grooming and upper body bathe/dress    Transfers      General transfer comment: NT as pt was seen for ADL grooming and UB bathe/dress this am. Pt declined sitting up at EOB.    Balance Overall balance assessment: Needs assistance         ADL either performed or assessed with clinical judgement   ADL Overall ADL's : Needs assistance/impaired     Grooming: Moderate assistance;Bed level (Sitting up bed level, pt declined oral care x2) Grooming Details (indicate cue type and reason): verbal and tactile cues, demonstration to wash face and hands, arms and chest after set-up sitting up in bed.Marland Kitchen Upper Body Bathing: Cueing for sequencing;Bed level;Moderate  assistance;Maximal assistance (Sitting up bed level. Increased time, pt appears confused this morning) Upper Body Bathing Details (indicate cue type and reason): verbal and tactile cues as well as demonstration, increased time.     Upper Body Dressing : Moderate assistance Upper Body Dressing Details (indicate cue type and reason): Verbal, tactile and demonstration, increased time. Overall Mod assist as pt with confusion this morning                          Cognition Arousal/Alertness: Awake/alert Behavior During Therapy: Impulsive Overall Cognitive Status: History of cognitive impairments - at baseline Area of Impairment: Attention;Following commands;Safety/judgement;Awareness;Problem solving;Orientation;Memory        Orientation Level: Disoriented to;Time;Situation;Place Current Attention Level: Sustained Memory: Decreased recall of precautions;Decreased short-term memory Following Commands: Follows one step commands inconsistently;Follows one step commands with increased time Safety/Judgement: Decreased awareness of safety;Decreased awareness of deficits Awareness: Intellectual Problem Solving: Slow processing;Requires verbal cues;Requires tactile cues General Comments: Pt at times makes sense, other times statements appear random. Pt smiling and pleasant throughout session      Pertinent Vitals/ Pain       Pain Assessment: Faces Faces Pain Scale: No hurt   Frequency  Min 2X/week        Progress Toward Goals  OT Goals(current goals can now be found in the care plan section)  Progress towards OT goals: Progressing toward goals (Slow)     Plan Discharge plan remains appropriate;Frequency remains appropriate       AM-PAC OT "6 Clicks" Daily Activity  Outcome Measure   Help from another person eating meals?: A Little Help from another person taking care of personal grooming?: A Little Help from another person toileting, which includes using toliet,  bedpan, or urinal?: A Lot Help from another person bathing (including washing, rinsing, drying)?: A Lot Help from another person to put on and taking off regular upper body clothing?: A Lot Help from another person to put on and taking off regular lower body clothing?: A Lot 6 Click Score: 14    End of Session    OT Visit Diagnosis: Unsteadiness on feet (R26.81);Muscle weakness (generalized) (M62.81)   Activity Tolerance Patient tolerated treatment well   Patient Left in bed;with call bell/phone within reach;with bed alarm set   Nurse Communication Other (comment) (Bed level grooming and UB bathe and dressing performed)        Time: 9470-7615 OT Time Calculation (min): 17 min  Charges: OT General Charges $OT Visit: 1 Visit OT Treatments $Self Care/Home Management : 8-22 mins    Dimitri Shakespeare Beth Dixon, OTR/L 01/04/2021, 10:44 AM

## 2021-01-05 DIAGNOSIS — I5020 Unspecified systolic (congestive) heart failure: Secondary | ICD-10-CM | POA: Diagnosis not present

## 2021-01-05 DIAGNOSIS — I10 Essential (primary) hypertension: Secondary | ICD-10-CM | POA: Diagnosis not present

## 2021-01-05 DIAGNOSIS — F0391 Unspecified dementia with behavioral disturbance: Secondary | ICD-10-CM | POA: Diagnosis not present

## 2021-01-05 DIAGNOSIS — J9601 Acute respiratory failure with hypoxia: Secondary | ICD-10-CM | POA: Diagnosis not present

## 2021-01-05 LAB — SARS CORONAVIRUS 2 (TAT 6-24 HRS): SARS Coronavirus 2: NEGATIVE

## 2021-01-05 NOTE — TOC Progression Note (Signed)
Transition of Care [facility]) - Progression Note    Patient Details  Name: Craig [name] MRN: [id number] Date of Birth: [birth year]  Transition of Care [facility]) CM/SW Contact  Reece Agar, [place] Phone Number: 01/05/2021, 4:59 PM  Clinical Narrative:    Pt auth approved, pt to DC tomorrow. CSW following.   Expected Discharge Plan: [place] Barriers to Discharge: Continued Medical Work up  Expected Discharge Plan and Services Expected Discharge Plan: [place] In-house Referral: Clinical Social Work Discharge Planning Services: NA Post Acute Care Choice: [place] Living arrangements for the past 2 months: [place] Expected Discharge Date: 01/03/21                                     Social Determinants of Health (SDOH) Interventions    Readmission Risk Interventions Readmission Risk Prevention Plan 06/15/2020  Transportation Screening Complete  PCP or Specialist Appt within 3-5 Days Complete  HRI or [place] (No Data)  Social Work Consult for [place] Planning/Counseling [place] Not Applicable  Medication Review Press photographer) Complete  Some recent data might be hidden

## 2021-01-05 NOTE — Progress Notes (Signed)
PROGRESS NOTE    Craig [name]  QPY:[id number] DOB: 04-08-1941 DOA: 12/22/2020 PCP: [name], MD    Brief Narrative:  80 year old male past medical history for systolic heart failure, atrial flutter, coronary artery disease and dementia who presented with dyspnea and hypoxemia.  He lives in a nursing home, he was noted to have few weeks of worsening dyspnea and lower extremity edema, refractive to oral furosemide.  On the day of hospitalization his oxygen saturation was 88% on room air, prompting him to be transferred to the ED. Noted 3+ bilateral lower extremity edema.  Advanced dementia. Labs showed BNP 3784. Chest radiograph with cardiomegaly, hilar vascular congestion and increased reticular markings at the right base. Patient was diuresed with intravenous furosemide, negative fluid balance achieved with significant improvement of his symptoms. Further work-up with echocardiography revealed left ventricle ejection fraction less than 20% with left ventricle global hypokinesis.  LA severely dilated, RA moderately dilated. Limited options and poor prognosis due to advanced dementia. His family would like to continue full code but follow up with palliative care services at [facility]. Plan to transfer back to skilled nursing facility.Patient is medically stable for discharge but waiting for insurance authorization.      Assessment & Plan:   Principal Problem:   Acute respiratory failure with hypoxia (HCC) Active Problems:   Essential hypertension   Atrial flutter (HCC)   Dementia with behavioral disturbance (HCC)   HFrEF (heart failure with reduced ejection fraction) (HCC)   Acute on chronic HFrEF (heart failure with reduced ejection fraction) (HCC)   Acute on chronic heart failure (HCC)    Acute hypoxemic respiratory failure due to systolic heart failure decompensation.  Hypertensive emergency. Patient was admitted to the cardiac ward, he received intravenous furosemide, negative  fluid balance was achieved -15,406 ml since admission with significant improvement of his symptoms. Further work-up revealed significant reduction on his LV systolic function, less than 20%, global hypokinesis, moderate reduction in RV systolic function, RSVP 12.4 mmHg, bilateral atrial dilatation and moderate functional mitral regurgitation Patient will continue heart failure management with losartan, metoprolol, spironolactone and empagliflozin. Pt had episodic nonsustained VT's on telemetry monitor, with poor prognosis, advanced CHF and dementia, family does not want aggressive measures but continue to be full code.  Follow-up as an outpatient with palliative care services Continue Lasix   Atrial fibrillation/atrial flutter  Continue rate control with metoprolol.  Patient not on anticoagulation due to advanced dementia and high fall risk.  Dementia On donepezil and buspirone. Advanced disease with poor prognosis, his family has filled the forms for advance directives, but still want patient to be full code.      Status is: Inpatient  Remains inpatient appropriate because: pending insurance authorization for SNF   Dispo: The patient is from: Home              Anticipated d/c is to: SNF              Patient currently is medically stable to d/c.   Difficult to place patient No   DVT prophylaxis: Scd   Code Status:    full  Family Communication:  No family at the bedside     Subjective: Patient looks comfortable, constantly talking.  Objective: Vitals:   01/05/21 0100 01/05/21 0433 01/05/21 0857 01/05/21 1253  BP:  128/72 137/82 116/80  Pulse:  70 88 100  Resp:  17 15 17   Temp:  (!) 97.4 F (36.3 C) 98 F (36.7 C)  TempSrc:  Oral Oral Oral  SpO2:  100% 100% 93%  Weight: 72.8 kg     Height:        Intake/Output Summary (Last 24 hours) at 01/05/2021 1933 Last data filed at 01/05/2021 1751 Gross per 24 hour  Intake 240 ml  Output 1525 ml  Net -1285 ml   Filed Weights    01/04/21 0056 01/04/21 0500 01/05/21 0100  Weight: 74.4 kg 74.4 kg 72.8 kg    Examination:  General: NAD, pleasantly confused Cardiovascular: S1, S2 present Respiratory: CTAB Abdomen: Soft, nontender, nondistended, bowel sounds present Musculoskeletal: No bilateral pedal edema noted Skin: Normal Psychiatry: Normal mood       Data Reviewed: I have personally reviewed following labs and imaging studies  CBC: No results for input(s): WBC, NEUTROABS, HGB, HCT, MCV, PLT in the last 168 hours. Basic Metabolic Panel: Recent Labs  Lab 12/30/20 1538 12/31/20 0357 01/03/21 0344  NA 138 137 136  K 3.8 3.8 3.7  CL 101 100 102  CO2 27 28 27   GLUCOSE 91 102* 109*  BUN 29* 34* 30*  CREATININE 1.10 0.99 1.04  CALCIUM 8.8* 9.0 8.7*   GFR: Estimated Creatinine Clearance: 60.3 mL/min (by C-G formula based on SCr of 1.04 mg/dL). Liver Function Tests: No results for input(s): AST, ALT, ALKPHOS, BILITOT, PROT, ALBUMIN in the last 168 hours. No results for input(s): LIPASE, AMYLASE in the last 168 hours. No results for input(s): AMMONIA in the last 168 hours. Coagulation Profile: No results for input(s): INR, PROTIME in the last 168 hours. Cardiac Enzymes: No results for input(s): CKTOTAL, CKMB, CKMBINDEX, TROPONINI in the last 168 hours. BNP (last 3 results) No results for input(s): PROBNP in the last 8760 hours. HbA1C: No results for input(s): HGBA1C in the last 72 hours. CBG: No results for input(s): GLUCAP in the last 168 hours. Lipid Profile: No results for input(s): CHOL, HDL, LDLCALC, TRIG, CHOLHDL, LDLDIRECT in the last 72 hours. Thyroid Function Tests: No results for input(s): TSH, T4TOTAL, FREET4, T3FREE, THYROIDAB in the last 72 hours. Anemia Panel: No results for input(s): VITAMINB12, FOLATE, FERRITIN, TIBC, IRON, RETICCTPCT in the last 72 hours.    Radiology Studies: I have reviewed all of the imaging during this hospital visit personally     Scheduled  Meds:  aspirin EC  81 mg Oral Daily   busPIRone  5 mg Oral TID   donepezil  10 mg Oral QHS   empagliflozin  10 mg Oral Daily   enoxaparin (LOVENOX) injection  40 mg Subcutaneous Q24H   feeding supplement  237 mL Oral TID BM   furosemide  60 mg Oral Daily   losartan  25 mg Oral Daily   melatonin  3 mg Oral QHS   metoprolol succinate  50 mg Oral Daily   multivitamin with minerals  1 tablet Oral Daily   pantoprazole  40 mg Oral Daily   sodium chloride flush  3 mL Intravenous Q12H   spironolactone  25 mg Oral Daily   Continuous Infusions:  sodium chloride       LOS: 12 days        Alma Friendly, MD

## 2021-01-06 DIAGNOSIS — J9601 Acute respiratory failure with hypoxia: Secondary | ICD-10-CM | POA: Diagnosis not present

## 2021-01-06 DIAGNOSIS — I5023 Acute on chronic systolic (congestive) heart failure: Secondary | ICD-10-CM | POA: Diagnosis not present

## 2021-01-06 DIAGNOSIS — I10 Essential (primary) hypertension: Secondary | ICD-10-CM | POA: Diagnosis not present

## 2021-01-06 DIAGNOSIS — F0391 Unspecified dementia with behavioral disturbance: Secondary | ICD-10-CM | POA: Diagnosis not present

## 2021-01-06 MED ORDER — MELATONIN 3 MG PO TABS: 3.0000 mg | ORAL_TABLET | Freq: Every day | ORAL | 0 refills | Status: AC

## 2021-01-06 MED ORDER — EMPAGLIFLOZIN 10 MG PO TABS: 10.0000 mg | ORAL_TABLET | Freq: Every day | ORAL | 0 refills | Status: AC

## 2021-01-06 MED ORDER — FUROSEMIDE 20 MG PO TABS
60.0000 mg | ORAL_TABLET | Freq: Every day | ORAL | Status: AC
Start: 2021-01-07 — End: ?

## 2021-01-06 MED ORDER — SPIRONOLACTONE 25 MG PO TABS: 25.0000 mg | ORAL_TABLET | Freq: Every day | ORAL | 0 refills | Status: AC

## 2021-01-06 NOTE — Care Management Important Message (Signed)
Important Message  Patient Details  Name: Craig [name] MRN: [id number] Date of Birth: March 04, 1941   Medicare Important Message Given:  Yes     Shelda Altes 01/06/2021, 9:51 AM

## 2021-01-06 NOTE — Discharge Summary (Signed)
Discharge Summary  Craig [name] CHE:[id number] DOB: 1940-07-01  PCP: [name], MD  Admit date: 12/22/2020 Discharge date: 01/06/2021  Time spent: 40 mins  Recommendations for Outpatient Follow-up:  Follow up with  Dr. Lennox Grumbles in 7 to 10 days. Furosemide increase to 60 mg daily, increase to twice daily in case of lower extremity edema, dyspnea or weight gain 3 lbs in 24 hrs or 5 lbs in 7 days. Added losartan, spironolactone and empagliflozin  Follow up renal function and electrolytes in 7 days.   Discharge Diagnoses:  Active Hospital Problems   Diagnosis Date Noted   Acute respiratory failure with hypoxia ([place]) 12/23/2020   Acute on chronic heart failure (HCC) 12/24/2020   Acute on chronic HFrEF (heart failure with reduced ejection fraction) ([place]) 12/23/2020   HFrEF (heart failure with reduced ejection fraction) ([place])    Dementia with behavioral disturbance ([place]) 04/21/2020   Atrial flutter ([place])    Essential hypertension 12/25/2017    Resolved Hospital Problems  No resolved problems to display.    Discharge Condition: Stable  Diet recommendation: Heart healthy  Vitals:   01/06/21 1017 01/06/21 1117  BP: 117/74 125/86  Pulse: (!) 54 (!) 54  Resp: 16 18  Temp: 97.8 F (36.6 C) 98.1 F (36.7 C)  SpO2: 98% 100%    History of present illness:  80 year old male past medical history for systolic heart failure, atrial flutter, coronary artery disease and dementia who presented with dyspnea and hypoxemia.  He lives in a nursing home, he was noted to have few weeks of worsening dyspnea and lower extremity edema, refractive to oral furosemide.  On the day of hospitalization his oxygen saturation was 88% on room air, prompting him to be transferred to the ED. Noted 3+ bilateral lower extremity edema.  Advanced dementia. Labs showed BNP 3784. Chest radiograph with cardiomegaly, hilar vascular congestion and increased reticular markings at the right base. Patient was  diuresed with intravenous furosemide, negative fluid balance achieved with significant improvement of his symptoms. Further work-up with echocardiography revealed left ventricle ejection fraction less than 20% with left ventricle global hypokinesis.  LA severely dilated, RA moderately dilated. Limited options and poor prognosis due to advanced dementia. His family would like to continue full code but follow up with palliative care services at [facility]. Plan to transfer back to skilled nursing facility. Patient is medically stable for discharge.    Today, patient looks comfortable, pleasantly confused.    Hospital Course:  Principal Problem:   Acute respiratory failure with hypoxia (HCC) Active Problems:   Essential hypertension   Atrial flutter (HCC)   Dementia with behavioral disturbance (HCC)   HFrEF (heart failure with reduced ejection fraction) (HCC)   Acute on chronic HFrEF (heart failure with reduced ejection fraction) (HCC)   Acute on chronic heart failure (HCC)   Acute hypoxemic respiratory failure due to systolic heart failure decompensation.  Hypertensive emergency. Patient was admitted to the cardiac ward, he received intravenous furosemide, negative fluid balance was achieved -15,406 ml since admission with significant improvement of his symptoms. Further work-up revealed significant reduction on his LV systolic function, less than 20%, global hypokinesis, moderate reduction in RV systolic function, RSVP 53.6 mmHg, bilateral atrial dilatation and moderate functional mitral regurgitation Patient will continue heart failure management with losartan, metoprolol, spironolactone and empagliflozin. Pt had episodic nonsustained VT's on telemetry monitor, with poor prognosis, advanced CHF and dementia, family does not want aggressive measures but continue to be full code.  Follow-up as an  outpatient with palliative care services Continue Lasix   Atrial fibrillation/atrial flutter Continue  rate control with metoprolol.  Patient not on anticoagulation due to advanced dementia and high fall risk.   Dementia On donepezil and buspirone. Advanced disease with poor prognosis, his family has filled the forms for advance directives, but still want patient to be full code.   Estimated body mass index is 20.66 kg/m as calculated from the following:   Height as of this encounter: 6\' 2"  (1.88 m).   Weight as of this encounter: 73 kg.    Procedures: None  Consultations: Cardiology Palliative/hospice   Discharge Exam: BP 125/86 (BP Location: Right Arm)   Pulse (!) 54   Temp 98.1 F (36.7 C) (Oral)   Resp 18   Ht 6\' 2"  (1.88 m)   Wt 73 kg   SpO2 100%   BMI 20.66 kg/m    General: NAD, pleasantly confused Cardiovascular: S1, S2 present Respiratory: CTAB Abdomen: Soft, nontender, nondistended, bowel sounds present Musculoskeletal: No bilateral pedal edema noted Skin: Normal Psychiatry: Unable to assess   Discharge Instructions You were cared for by a hospitalist during your hospital stay. If you have any questions about your discharge medications or the care you received while you were in the hospital after you are discharged, you can call the unit and asked to speak with the hospitalist on call if the hospitalist that took care of you is not available. Once you are discharged, your primary care physician will handle any further medical issues. Please note that NO REFILLS for any discharge medications will be authorized once you are discharged, as it is imperative that you return to your primary care physician (or establish a relationship with a primary care physician if you do not have one) for your aftercare needs so that they can reassess your need for medications and monitor your lab values.  Discharge Instructions     Discharge instructions   Complete by: As directed    Follow up with primary care in 7 to 10 days.   Increase activity slowly   Complete by: As  directed    Increase activity slowly   Complete by: As directed       Allergies as of 01/06/2021       Reactions   Penicillins         Medication List     STOP taking these medications    LORazepam 0.5 MG tablet Commonly known as: ATIVAN       TAKE these medications    acetaminophen 650 MG CR tablet Commonly known as: TYLENOL Take 650 mg by mouth every 8 (eight) hours as needed for pain.   aspirin EC 81 MG tablet Take 81 mg by mouth daily. Swallow whole.   busPIRone 5 MG tablet Commonly known as: BUSPAR Take 5 mg by mouth 3 (three) times daily.   donepezil 10 MG tablet Commonly known as: ARICEPT Take 10 mg by mouth at bedtime.   empagliflozin 10 MG Tabs tablet Commonly known as: JARDIANCE Take 1 tablet (10 mg total) by mouth daily.   feeding supplement Liqd Take 237 mLs by mouth 3 (three) times daily between meals.   furosemide 20 MG tablet Commonly known as: LASIX Take 3 tablets (60 mg total) by mouth daily. Start taking on: January 07, 2021 What changed: how much to take   losartan 25 MG tablet Commonly known as: COZAAR Take 1 tablet (25 mg total) by mouth daily.   melatonin 3 MG Tabs  tablet Take 1 tablet (3 mg total) by mouth at bedtime.   metoprolol succinate 50 MG 24 hr tablet Commonly known as: TOPROL-XL Take 50 mg by mouth daily. Take with or immediately following a meal.   multivitamin with minerals Tabs tablet Take 1 tablet by mouth daily.   OVER THE COUNTER MEDICATION 1 application. Apply compression stockings in the morning and remove at bedtime   pantoprazole 40 MG tablet Commonly known as: PROTONIX Take 1 tablet (40 mg total) by mouth daily.   simethicone 80 MG chewable tablet Commonly known as: MYLICON Chew 80 mg by mouth every 6 (six) hours as needed for flatulence.   spironolactone 25 MG tablet Commonly known as: ALDACTONE Take 1 tablet (25 mg total) by mouth daily.       Allergies  Allergen Reactions   Penicillins      Follow-up Information     [name], MD Follow up in 1 week(s).   Specialty: Family Medicine Contact information: [place] [address] [phone]                  The results of significant diagnostics from this hospitalization (including imaging, microbiology, ancillary and laboratory) are listed below for reference.    Significant Diagnostic Studies: DG Chest Portable 1 View  Result Date: 12/23/2020 CLINICAL DATA:  Increasing shortness of breath.  Hypoxia. EXAM: PORTABLE CHEST 1 VIEW COMPARISON:  06/01/2020 FINDINGS: Cardiac enlargement. Left perihilar and right basilar airspace disease. This is most likely to represent multifocal pneumonia although asymmetrical edema could also have this appearance. No pleural effusions. No pneumothorax. Calcification of the aorta. IMPRESSION: Cardiac enlargement. Patchy left perihilar and right basilar infiltrates. Electronically Signed   By: Lucienne Capers M.D.   On: 12/23/2020 00:14   ECHOCARDIOGRAM COMPLETE  Result Date: 12/23/2020    ECHOCARDIOGRAM REPORT   Patient Name:   WITTEN CERTAIN Trigg Date of Exam: 12/23/2020 Medical Rec #:  [id number]            Height:       74.0 in Accession #:    [id number]           Weight:       162.3 lb Date of Birth:  1941-03-08             BSA:          1.989 m Patient Age:    80 years             BP:           149/93 mmHg Patient Gender: M                    HR:           92 bpm. Exam Location:  Inpatient Procedure: 2D Echo, Cardiac Doppler and Color Doppler Indications:    CHF-Acute Systolic C94.70  History:        Patient has prior history of Echocardiogram examinations, most                 recent 06/01/2020. CAD, TIA, Arrythmias:Atrial Fibrillation;                 Risk Factors:Dyslipidemia and Hypertension.  Sonographer:    Bernadene Person RDCS Referring Phys: [phone] JARED M GARDNER  Sonographer Comments: Image acquisition challenging due to respiratory motion. IMPRESSIONS  1. Left  ventricular ejection fraction, by estimation, is <20%. The left ventricle has severely decreased function. The left  ventricle demonstrates global hypokinesis. There is mild left ventricular hypertrophy. Left ventricular diastolic parameters are indeterminate.  2. Right ventricular systolic function is moderately reduced. The right ventricular size is normal. There is mildly elevated pulmonary artery systolic pressure. The estimated right ventricular systolic pressure is 40.0 mmHg.  3. Left atrial size was severely dilated.  4. Right atrial size was moderately dilated.  5. The mitral valve is normal in structure. Moderate probably functional mitral valve regurgitation. No evidence of mitral stenosis.  6. The aortic valve is tricuspid. Aortic valve regurgitation is not visualized. No aortic stenosis is present.  7. Aortic dilatation noted. There is mild dilatation of the ascending aorta, measuring 37 mm.  8. The inferior vena cava is dilated in size with <50% respiratory variability, suggesting right atrial pressure of 15 mmHg.  9. The patient was in atrial fibrillation. FINDINGS  Left Ventricle: Left ventricular ejection fraction, by estimation, is <20%. The left ventricle has severely decreased function. The left ventricle demonstrates global hypokinesis. The left ventricular internal cavity size was normal in size. There is mild left ventricular hypertrophy. Left ventricular diastolic parameters are indeterminate. Right Ventricle: The right ventricular size is normal. No increase in right ventricular wall thickness. Right ventricular systolic function is moderately reduced. There is mildly elevated pulmonary artery systolic pressure. The tricuspid regurgitant velocity is 2.54 m/s, and with an assumed right atrial pressure of 15 mmHg, the estimated right ventricular systolic pressure is 86.7 mmHg. Left Atrium: Left atrial size was severely dilated. Right Atrium: Right atrial size was moderately dilated. Pericardium:  Trivial pericardial effusion is present. Mitral Valve: The mitral valve is normal in structure. Moderate mitral valve regurgitation. No evidence of mitral valve stenosis. Tricuspid Valve: The tricuspid valve is normal in structure. Tricuspid valve regurgitation is trivial. Aortic Valve: The aortic valve is tricuspid. Aortic valve regurgitation is not visualized. No aortic stenosis is present. Pulmonic Valve: The pulmonic valve was normal in structure. Pulmonic valve regurgitation is trivial. Aorta: The aortic root is normal in size and structure and aortic dilatation noted. There is mild dilatation of the ascending aorta, measuring 37 mm. Venous: The inferior vena cava is dilated in size with less than 50% respiratory variability, suggesting right atrial pressure of 15 mmHg. IAS/Shunts: No atrial level shunt detected by color flow Doppler.  LEFT VENTRICLE PLAX 2D LVIDd:         4.90 cm      Diastology LVIDs:         4.50 cm      LV e' medial:    3.94 cm/s LV PW:         1.30 cm      LV E/e' medial:  16.0 LV IVS:        1.30 cm      LV e' lateral:   8.89 cm/s LVOT diam:     2.30 cm      LV E/e' lateral: 7.1 LV SV:         39 LV SV Index:   20 LVOT Area:     4.15 cm  LV Volumes (MOD) LV vol d, MOD A2C: 199.0 ml LV vol d, MOD A4C: 154.0 ml LV vol s, MOD A2C: 141.0 ml LV vol s, MOD A4C: 118.0 ml LV SV MOD A2C:     58.0 ml LV SV MOD A4C:     154.0 ml LV SV MOD BP:      54.1 ml RIGHT VENTRICLE RV S prime:  5.25 cm/s TAPSE (M-mode): 0.9 cm LEFT ATRIUM              Index       RIGHT ATRIUM           Index LA diam:        4.70 cm  2.36 cm/m  RA Area:     25.80 cm LA Vol (A2C):   154.0 ml 77.43 ml/m RA Volume:   75.90 ml  38.16 ml/m LA Vol (A4C):   133.0 ml 66.87 ml/m LA Biplane Vol: 150.0 ml 75.42 ml/m  AORTIC VALVE LVOT Vmax:   63.93 cm/s LVOT Vmean:  39.367 cm/s LVOT VTI:    0.094 m  AORTA Ao Root diam: 3.70 cm Ao Asc diam:  3.70 cm MITRAL VALVE                 TRICUSPID VALVE MV Area (PHT): 2.22 cm      TR Peak  grad:   25.8 mmHg MV Decel Time: 342 msec      TR Vmax:        254.00 cm/s MR Peak grad:    98.4 mmHg MR Mean grad:    67.0 mmHg   SHUNTS MR Vmax:         496.00 cm/s Systemic VTI:  0.09 m MR Vmean:        388.0 cm/s  Systemic Diam: 2.30 cm MR PISA:         0.77 cm MR PISA Eff ROA: 6 mm MR PISA Radius:  0.35 cm MV E velocity: 62.90 cm/s MV A velocity: 27.45 cm/s MV E/A ratio:  2.29 Loralie Champagne MD Electronically signed by Loralie Champagne MD Signature Date/Time: 12/23/2020/4:03:19 PM    Final     Microbiology: Recent Results (from the past 240 hour(s))  Resp Panel by RT-PCR (Flu A&B, Covid) Nasopharyngeal Swab     Status: None   Collection Time: 12/31/20 11:21 AM   Specimen: Nasopharyngeal Swab; Nasopharyngeal(NP) swabs in vial transport medium  Result Value Ref Range Status   SARS Coronavirus 2 by RT PCR NEGATIVE NEGATIVE Final    Comment: (NOTE) SARS-CoV-2 target nucleic acids are NOT DETECTED.  The SARS-CoV-2 RNA is generally detectable in upper respiratory specimens during the acute phase of infection. The lowest concentration of SARS-CoV-2 viral copies this assay can detect is 138 copies/mL. A negative result does not preclude SARS-Cov-2 infection and should not be used as the sole basis for treatment or other patient management decisions. A negative result may occur with  improper specimen collection/handling, submission of specimen other than nasopharyngeal swab, presence of viral mutation(s) within the areas targeted by this assay, and inadequate number of viral copies(<138 copies/mL). A negative result must be combined with clinical observations, patient history, and epidemiological information. The expected result is Negative.  Fact Sheet for Patients:  EntrepreneurPulse.com.au  Fact Sheet for Healthcare Providers:  IncredibleEmployment.be  This test is no t yet approved or cleared by the [place] FDA and  has been authorized for  detection and/or diagnosis of SARS-CoV-2 by FDA under an Emergency Use Authorization (EUA). This EUA will remain  in effect (meaning this test can be used) for the duration of the COVID-19 declaration under Section 564(b)(1) of the Act, 21 U.S.C.section 360bbb-3(b)(1), unless the authorization is terminated  or revoked sooner.       Influenza A by PCR NEGATIVE NEGATIVE Final   Influenza B by PCR NEGATIVE NEGATIVE Final    Comment: (NOTE) The Xpert  Xpress SARS-CoV-2/FLU/RSV plus assay is intended as an aid in the diagnosis of influenza from Nasopharyngeal swab specimens and should not be used as a sole basis for treatment. Nasal washings and aspirates are unacceptable for Xpert Xpress SARS-CoV-2/FLU/RSV testing.  Fact Sheet for Patients: EntrepreneurPulse.com.au  Fact Sheet for Healthcare Providers: IncredibleEmployment.be  This test is not yet approved or cleared by the [place] FDA and has been authorized for detection and/or diagnosis of SARS-CoV-2 by FDA under an Emergency Use Authorization (EUA). This EUA will remain in effect (meaning this test can be used) for the duration of the COVID-19 declaration under Section 564(b)(1) of the Act, 21 U.S.C. section 360bbb-3(b)(1), unless the authorization is terminated or revoked.  Performed at [facility], [place] [address] [place], [place] [address]   SARS CORONAVIRUS 2 (TAT 6-24 HRS) Nasopharyngeal Nasopharyngeal Swab     Status: None   Collection Time: 01/05/21  5:20 PM   Specimen: Nasopharyngeal Swab  Result Value Ref Range Status   SARS Coronavirus 2 NEGATIVE NEGATIVE Final    Comment: (NOTE) SARS-CoV-2 target nucleic acids are NOT DETECTED.  The SARS-CoV-2 RNA is generally detectable in upper and lower respiratory specimens during the acute phase of infection. Negative results do not preclude SARS-CoV-2 infection, do not rule out co-infections with other pathogens, and should  not be used as the sole basis for treatment or other patient management decisions. Negative results must be combined with clinical observations, patient history, and epidemiological information. The expected result is Negative.  Fact Sheet for Patients: SugarRoll.be  Fact Sheet for Healthcare Providers: https://www.woods-mathews.com/  This test is not yet approved or cleared by the [place] FDA and  has been authorized for detection and/or diagnosis of SARS-CoV-2 by FDA under an Emergency Use Authorization (EUA). This EUA will remain  in effect (meaning this test can be used) for the duration of the COVID-19 declaration under Se ction 564(b)(1) of the Act, 21 U.S.C. section 360bbb-3(b)(1), unless the authorization is terminated or revoked sooner.  Performed at [facility], [place] [address] [place], [place] [address]      Labs: Basic Metabolic Panel: Recent Labs  Lab 12/30/20 1538 12/31/20 0357 01/03/21 0344  NA 138 137 136  K 3.8 3.8 3.7  CL 101 100 102  CO2 27 28 27   GLUCOSE 91 102* 109*  BUN 29* 34* 30*  CREATININE 1.10 0.99 1.04  CALCIUM 8.8* 9.0 8.7*   Liver Function Tests: No results for input(s): AST, ALT, ALKPHOS, BILITOT, PROT, ALBUMIN in the last 168 hours. No results for input(s): LIPASE, AMYLASE in the last 168 hours. No results for input(s): AMMONIA in the last 168 hours. CBC: No results for input(s): WBC, NEUTROABS, HGB, HCT, MCV, PLT in the last 168 hours. Cardiac Enzymes: No results for input(s): CKTOTAL, CKMB, CKMBINDEX, TROPONINI in the last 168 hours. BNP: BNP (last 3 results) Recent Labs    05/26/20 2024 12/22/20 2359 12/25/20 0143  BNP 256.9* 3,784.4* 3,508.4*    ProBNP (last 3 results) No results for input(s): PROBNP in the last 8760 hours.  CBG: No results for input(s): GLUCAP in the last 168 hours.     Signed:  Alma Friendly, MD [facility] 01/06/2021, 11:18  AM

## 2021-01-06 NOTE — TOC Transition Note (Signed)
Transition of Care [facility]) - CM/SW Discharge Note   Patient Details  Name: Craig [name] MRN: [id number] Date of Birth: Nov 22, 1940  [facility] CM/SW Contact:  Tresa Endo Phone Number: 01/06/2021, 3:28 PM   Clinical Narrative:    Patient will DC to: [place] Anticipated DC date: 01/06/2021 Family notified: Pt son Transport by: Corey Harold   Per MD patient ready for DC to [facility]. RN to call report prior to discharge [phone]). RN, patient, patient's family, and facility notified of DC. Discharge Summary and FL2 sent to facility. DC packet on chart. Ambulance transport requested for patient.   CSW will sign off for now as social work intervention is no longer needed. Please consult [place] again if new needs arise.     Final next level of care: [place] Barriers to Discharge: Continued Medical Work up   Patient Goals and CMS Choice Patient states their goals for this hospitalization and ongoing recovery are:: To get into a new rehab facility closer to [facility] and continue rehab CMS Medicare.gov Compare Post Acute Care list provided to:: Patient Choice offered to / list presented to : Patient  Discharge Placement                       Discharge Plan and Services In-house Referral: Clinical Social Work Discharge Planning Services: NA Post Acute Care Choice: [place]                               Social Determinants of Health (SDOH) Interventions     Readmission Risk Interventions Readmission Risk Prevention Plan 06/15/2020  Transportation Screening Complete  PCP or Specialist Appt within 3-5 Days Complete  HRI or [place] (No Data)  Social Work Consult for [place] Planning/Counseling [place] Not Applicable  Medication Review Press photographer) Complete  Some recent data might be hidden

## 2021-01-06 NOTE — Progress Notes (Signed)
PT Cancellation Note  Patient Details Name: Darien Kading MRN: [id number] DOB: 1941-03-22   Cancelled Treatment:    Reason Eval/Treat Not Completed: Patient preparing for d/c to SNF via [facility]; will defer treatment.  Mabeline Caras, PT, DPT Acute Rehabilitation Services  Pager [phone] Office [place] 01/06/2021, 5:06 PM

## 2021-01-06 NOTE — Progress Notes (Signed)
RN attempted to call report to facility ([phone]).  No one answered. Will reattempt.

## 2021-01-06 NOTE — Progress Notes (Signed)
Report given to RN at [facility]

## 2021-02-24 ENCOUNTER — Inpatient Hospital Stay: Payer: Medicare (Managed Care)

## 2021-02-24 ENCOUNTER — Other Ambulatory Visit: Payer: Self-pay

## 2021-02-24 ENCOUNTER — Inpatient Hospital Stay
Admission: EM | Admit: 2021-02-24 | Discharge: 2021-02-24 | Disposition: A | Discharge status: Home / Self Care | Payer: Medicare (Managed Care) | Source: Home / Self Care | Attending: Pulmonary Disease | Admitting: Pulmonary Disease

## 2021-02-24 ENCOUNTER — Emergency Department: Payer: Medicare (Managed Care)

## 2021-02-24 ENCOUNTER — Encounter: Payer: Self-pay | Admitting: *Deleted

## 2021-02-24 ENCOUNTER — Inpatient Hospital Stay
Admission: EM | Admit: 2021-02-24 | Discharge: 2021-03-19 | DRG: 871 | Disposition: E | Payer: Medicare (Managed Care) | Attending: Internal Medicine | Admitting: Internal Medicine

## 2021-02-24 DIAGNOSIS — I82413 Acute embolism and thrombosis of femoral vein, bilateral: Secondary | ICD-10-CM | POA: Diagnosis present

## 2021-02-24 DIAGNOSIS — E785 Hyperlipidemia, unspecified: Secondary | ICD-10-CM | POA: Diagnosis present

## 2021-02-24 DIAGNOSIS — Z7982 Long term (current) use of aspirin: Secondary | ICD-10-CM

## 2021-02-24 DIAGNOSIS — I21A1 Myocardial infarction type 2: Secondary | ICD-10-CM | POA: Diagnosis present

## 2021-02-24 DIAGNOSIS — Z66 Do not resuscitate: Secondary | ICD-10-CM | POA: Diagnosis not present

## 2021-02-24 DIAGNOSIS — Z8673 Personal history of transient ischemic attack (TIA), and cerebral infarction without residual deficits: Secondary | ICD-10-CM

## 2021-02-24 DIAGNOSIS — Z6820 Body mass index (BMI) 20.0-20.9, adult: Secondary | ICD-10-CM

## 2021-02-24 DIAGNOSIS — I4891 Unspecified atrial fibrillation: Secondary | ICD-10-CM | POA: Diagnosis present

## 2021-02-24 DIAGNOSIS — U071 COVID-19: Secondary | ICD-10-CM | POA: Diagnosis present

## 2021-02-24 DIAGNOSIS — I4892 Unspecified atrial flutter: Secondary | ICD-10-CM | POA: Diagnosis present

## 2021-02-24 DIAGNOSIS — Z955 Presence of coronary angioplasty implant and graft: Secondary | ICD-10-CM

## 2021-02-24 DIAGNOSIS — I2510 Atherosclerotic heart disease of native coronary artery without angina pectoris: Secondary | ICD-10-CM | POA: Diagnosis present

## 2021-02-24 DIAGNOSIS — T68XXXA Hypothermia, initial encounter: Secondary | ICD-10-CM

## 2021-02-24 DIAGNOSIS — E119 Type 2 diabetes mellitus without complications: Secondary | ICD-10-CM | POA: Diagnosis present

## 2021-02-24 DIAGNOSIS — A419 Sepsis, unspecified organism: Secondary | ICD-10-CM | POA: Diagnosis present

## 2021-02-24 DIAGNOSIS — E872 Acidosis, unspecified: Secondary | ICD-10-CM

## 2021-02-24 DIAGNOSIS — I82453 Acute embolism and thrombosis of peroneal vein, bilateral: Secondary | ICD-10-CM | POA: Diagnosis present

## 2021-02-24 DIAGNOSIS — J342 Deviated nasal septum: Secondary | ICD-10-CM | POA: Diagnosis present

## 2021-02-24 DIAGNOSIS — A4189 Other specified sepsis: Principal | ICD-10-CM | POA: Diagnosis present

## 2021-02-24 DIAGNOSIS — R600 Localized edema: Secondary | ICD-10-CM

## 2021-02-24 DIAGNOSIS — K761 Chronic passive congestion of liver: Secondary | ICD-10-CM | POA: Diagnosis present

## 2021-02-24 DIAGNOSIS — I5023 Acute on chronic systolic (congestive) heart failure: Secondary | ICD-10-CM | POA: Diagnosis present

## 2021-02-24 DIAGNOSIS — R64 Cachexia: Secondary | ICD-10-CM | POA: Diagnosis present

## 2021-02-24 DIAGNOSIS — F0391 Unspecified dementia with behavioral disturbance: Secondary | ICD-10-CM | POA: Diagnosis present

## 2021-02-24 DIAGNOSIS — I82443 Acute embolism and thrombosis of tibial vein, bilateral: Secondary | ICD-10-CM | POA: Diagnosis present

## 2021-02-24 DIAGNOSIS — R791 Abnormal coagulation profile: Secondary | ICD-10-CM | POA: Diagnosis present

## 2021-02-24 DIAGNOSIS — G9341 Metabolic encephalopathy: Secondary | ICD-10-CM | POA: Diagnosis present

## 2021-02-24 DIAGNOSIS — J1282 Pneumonia due to coronavirus disease 2019: Secondary | ICD-10-CM | POA: Diagnosis present

## 2021-02-24 DIAGNOSIS — R8281 Pyuria: Secondary | ICD-10-CM | POA: Diagnosis present

## 2021-02-24 DIAGNOSIS — D696 Thrombocytopenia, unspecified: Secondary | ICD-10-CM | POA: Diagnosis present

## 2021-02-24 DIAGNOSIS — R570 Cardiogenic shock: Secondary | ICD-10-CM | POA: Diagnosis present

## 2021-02-24 DIAGNOSIS — E440 Moderate protein-calorie malnutrition: Secondary | ICD-10-CM | POA: Diagnosis present

## 2021-02-24 DIAGNOSIS — N179 Acute kidney failure, unspecified: Secondary | ICD-10-CM | POA: Diagnosis present

## 2021-02-24 DIAGNOSIS — F17290 Nicotine dependence, other tobacco product, uncomplicated: Secondary | ICD-10-CM | POA: Diagnosis present

## 2021-02-24 DIAGNOSIS — Y92129 Unspecified place in nursing home as the place of occurrence of the external cause: Secondary | ICD-10-CM

## 2021-02-24 DIAGNOSIS — W19XXXA Unspecified fall, initial encounter: Secondary | ICD-10-CM | POA: Diagnosis present

## 2021-02-24 DIAGNOSIS — M4856XA Collapsed vertebra, not elsewhere classified, lumbar region, initial encounter for fracture: Secondary | ICD-10-CM | POA: Diagnosis present

## 2021-02-24 DIAGNOSIS — Z515 Encounter for palliative care: Secondary | ICD-10-CM

## 2021-02-24 DIAGNOSIS — Z7189 Other specified counseling: Secondary | ICD-10-CM | POA: Diagnosis not present

## 2021-02-24 DIAGNOSIS — Z880 Allergy status to penicillin: Secondary | ICD-10-CM

## 2021-02-24 DIAGNOSIS — I110 Hypertensive heart disease with heart failure: Secondary | ICD-10-CM | POA: Diagnosis present

## 2021-02-24 DIAGNOSIS — I509 Heart failure, unspecified: Secondary | ICD-10-CM

## 2021-02-24 DIAGNOSIS — R609 Edema, unspecified: Secondary | ICD-10-CM

## 2021-02-24 DIAGNOSIS — R7989 Other specified abnormal findings of blood chemistry: Secondary | ICD-10-CM | POA: Diagnosis present

## 2021-02-24 DIAGNOSIS — R040 Epistaxis: Secondary | ICD-10-CM | POA: Diagnosis present

## 2021-02-24 DIAGNOSIS — J189 Pneumonia, unspecified organism: Secondary | ICD-10-CM | POA: Diagnosis present

## 2021-02-24 DIAGNOSIS — Z79899 Other long term (current) drug therapy: Secondary | ICD-10-CM

## 2021-02-24 DIAGNOSIS — K8020 Calculus of gallbladder without cholecystitis without obstruction: Secondary | ICD-10-CM | POA: Diagnosis present

## 2021-02-24 LAB — ECHOCARDIOGRAM COMPLETE
AR max vel: 2.76 cm2
AV Area VTI: 2.72 cm2
AV Area mean vel: 2.64 cm2
AV Mean grad: 1 mmHg
AV Peak grad: 1.8 mmHg
Ao pk vel: 0.68 m/s
Area-P 1/2: 7.29 cm2
Height: 75 in
MV VTI: 2.4 cm2
S' Lateral: 4.6 cm
Weight: 2701.96 oz

## 2021-02-24 LAB — CBC WITH DIFFERENTIAL/PLATELET
Abs Immature Granulocytes: 0.03 10*3/uL (ref 0.00–0.07)
Basophils Absolute: 0 10*3/uL (ref 0.0–0.1)
Basophils Relative: 0 %
Eosinophils Absolute: 0 10*3/uL (ref 0.0–0.5)
Eosinophils Relative: 0 %
HCT: 40 % (ref 39.0–52.0)
Hemoglobin: 12.7 g/dL — ABNORMAL LOW (ref 13.0–17.0)
Immature Granulocytes: 1 %
Lymphocytes Relative: 11 %
Lymphs Abs: 0.6 10*3/uL — ABNORMAL LOW (ref 0.7–4.0)
MCH: 33.9 pg (ref 26.0–34.0)
MCHC: 31.8 g/dL (ref 30.0–36.0)
MCV: 106.7 fL — ABNORMAL HIGH (ref 80.0–100.0)
Monocytes Absolute: 0.2 10*3/uL (ref 0.1–1.0)
Monocytes Relative: 4 %
Neutro Abs: 5 10*3/uL (ref 1.7–7.7)
Neutrophils Relative %: 84 %
Platelets: 188 10*3/uL (ref 150–400)
RBC: 3.75 MIL/uL — ABNORMAL LOW (ref 4.22–5.81)
RDW: 19.6 % — ABNORMAL HIGH (ref 11.5–15.5)
Smear Review: NORMAL
WBC: 5.9 10*3/uL (ref 4.0–10.5)
nRBC: 5.6 % — ABNORMAL HIGH (ref 0.0–0.2)

## 2021-02-24 LAB — BRAIN NATRIURETIC PEPTIDE: B Natriuretic Peptide: >4500 pg/mL — ABNORMAL HIGH (ref 0.0–100.0)

## 2021-02-24 LAB — URINALYSIS, COMPLETE (UACMP) WITH MICROSCOPIC
Glucose, UA: 500 mg/dL — AB
Hgb urine dipstick: NEGATIVE
Ketones, ur: NEGATIVE mg/dL
Leukocytes,Ua: NEGATIVE
Nitrite: NEGATIVE
Protein, ur: 30 mg/dL — AB
Specific Gravity, Urine: 1.02 (ref 1.005–1.030)
Squamous Epithelial / HPF: NONE SEEN (ref 0–5)
pH: 5.5 (ref 5.0–8.0)

## 2021-02-24 LAB — COMPREHENSIVE METABOLIC PANEL
ALT: 52 U/L — ABNORMAL HIGH (ref 0–44)
AST: 67 U/L — ABNORMAL HIGH (ref 15–41)
Albumin: 2.3 g/dL — ABNORMAL LOW (ref 3.5–5.0)
Alkaline Phosphatase: 74 U/L (ref 38–126)
Anion gap: 17 — ABNORMAL HIGH (ref 5–15)
BUN: 30 mg/dL — ABNORMAL HIGH (ref 8–23)
CO2: 17 mmol/L — ABNORMAL LOW (ref 22–32)
Calcium: 8.6 mg/dL — ABNORMAL LOW (ref 8.9–10.3)
Chloride: 102 mmol/L (ref 98–111)
Creatinine, Ser: 1.27 mg/dL — ABNORMAL HIGH (ref 0.61–1.24)
GFR, Estimated: 58 mL/min — ABNORMAL LOW (ref >60)
Glucose, Bld: 123 mg/dL — ABNORMAL HIGH (ref 70–99)
Potassium: 3.5 mmol/L (ref 3.5–5.1)
Sodium: 136 mmol/L (ref 135–145)
Total Bilirubin: 2.3 mg/dL — ABNORMAL HIGH (ref 0.3–1.2)
Total Protein: 7.3 g/dL (ref 6.5–8.1)

## 2021-02-24 LAB — CK: Total CK: 62 U/L (ref 49–397)

## 2021-02-24 LAB — TROPONIN I (HIGH SENSITIVITY)
Troponin I (High Sensitivity): 51 ng/L — ABNORMAL HIGH (ref <18)
Troponin I (High Sensitivity): 48 ng/L — ABNORMAL HIGH (ref <18)

## 2021-02-24 LAB — PROTIME-INR
INR: 2.3 — ABNORMAL HIGH (ref 0.8–1.2)
Prothrombin Time: 25.2 seconds — ABNORMAL HIGH (ref 11.4–15.2)

## 2021-02-24 LAB — GLUCOSE, CAPILLARY
Glucose-Capillary: 80 mg/dL (ref 70–99)
Glucose-Capillary: 107 mg/dL — ABNORMAL HIGH (ref 70–99)
Glucose-Capillary: 96 mg/dL (ref 70–99)
Glucose-Capillary: 87 mg/dL (ref 70–99)
Glucose-Capillary: 167 mg/dL — ABNORMAL HIGH (ref 70–99)
Glucose-Capillary: 112 mg/dL — ABNORMAL HIGH (ref 70–99)

## 2021-02-24 LAB — RESP PANEL BY RT-PCR (FLU A&B, COVID) ARPGX2
Influenza A by PCR: NEGATIVE
Influenza B by PCR: NEGATIVE
SARS Coronavirus 2 by RT PCR: POSITIVE — AB

## 2021-02-24 LAB — PATHOLOGIST SMEAR REVIEW

## 2021-02-24 LAB — LACTIC ACID, PLASMA
Lactic Acid, Venous: 8.6 mmol/L (ref 0.5–1.9)
Lactic Acid, Venous: 7.4 mmol/L (ref 0.5–1.9)
Lactic Acid, Venous: 7.2 mmol/L (ref 0.5–1.9)
Lactic Acid, Venous: 6.6 mmol/L (ref 0.5–1.9)

## 2021-02-24 LAB — STREP PNEUMONIAE URINARY ANTIGEN: Strep Pneumo Urinary Antigen: NEGATIVE

## 2021-02-24 LAB — PROCALCITONIN: Procalcitonin: <0.1 ng/mL

## 2021-02-24 LAB — MRSA NEXT GEN BY PCR, NASAL: MRSA by PCR Next Gen: NOT DETECTED

## 2021-02-24 LAB — LIPASE, BLOOD: Lipase: 30 U/L (ref 11–51)

## 2021-02-24 LAB — APTT: aPTT: 36 seconds (ref 24–36)

## 2021-02-24 LAB — HEPARIN LEVEL (UNFRACTIONATED): Heparin Unfractionated: 0.48 IU/mL (ref 0.30–0.70)

## 2021-02-24 MED ORDER — VANCOMYCIN HCL IN DEXTROSE 1-5 GM/200ML-% IV SOLN
1000.0000 mg | Freq: Once | INTRAVENOUS | Status: AC
  Administered 2021-02-24: 1000 mg via INTRAVENOUS

## 2021-02-24 MED ORDER — ACETAMINOPHEN 325 MG PO TABS
650.0000 mg | ORAL_TABLET | Freq: Four times a day (QID) | ORAL | Status: DC | PRN
  Filled 2021-02-24: qty 2

## 2021-02-24 MED ORDER — GUAIFENESIN-DM 100-10 MG/5ML PO SYRP: 10.0000 mL | ORAL_SOLUTION | ORAL | Status: DC | PRN

## 2021-02-24 MED ORDER — DEXAMETHASONE SODIUM PHOSPHATE 10 MG/ML IJ SOLN
6.0000 mg | INTRAMUSCULAR | Status: DC
Start: 2021-02-24 — End: 2021-02-24
  Administered 2021-02-24: 6 mg via INTRAVENOUS
  Filled 2021-02-24 (×2): qty 0.6

## 2021-02-24 MED ORDER — IOHEXOL 350 MG/ML SOLN
80.0000 mL | Freq: Once | INTRAVENOUS | Status: AC | PRN
  Administered 2021-02-24: 80 mL via INTRAVENOUS

## 2021-02-24 MED ORDER — POLYETHYLENE GLYCOL 3350 17 G PO PACK: 17.0000 g | PACK | Freq: Every day | ORAL | Status: DC | PRN

## 2021-02-24 MED ORDER — ENSURE ENLIVE PO LIQD
237.0000 mL | Freq: Three times a day (TID) | ORAL | Status: DC
  Administered 2021-02-24 – 2021-02-25 (×3): 237 mL via ORAL

## 2021-02-24 MED ORDER — LACTATED RINGERS IV BOLUS (SEPSIS)
1000.0000 mL | Freq: Once | INTRAVENOUS | Status: AC
  Administered 2021-02-24: 1000 mL via INTRAVENOUS

## 2021-02-24 MED ORDER — DEXAMETHASONE SODIUM PHOSPHATE 4 MG/ML IJ SOLN
6.0000 mg | INTRAMUSCULAR | Status: DC
  Administered 2021-02-25: 6 mg via INTRAVENOUS
  Filled 2021-02-24: qty 2

## 2021-02-24 MED ORDER — VANCOMYCIN HCL 1250 MG/250ML IV SOLN
1250.0000 mg | INTRAVENOUS | Status: DC
  Filled 2021-02-24: qty 250

## 2021-02-24 MED ORDER — LACTATED RINGERS IV SOLN: INTRAVENOUS | Status: AC

## 2021-02-24 MED ORDER — HEPARIN (PORCINE) 25000 UT/250ML-% IV SOLN
1050.0000 [IU]/h | INTRAVENOUS | Status: DC
  Administered 2021-02-24 – 2021-02-25 (×3): 1200 [IU]/h via INTRAVENOUS
  Administered 2021-02-26: 1050 [IU]/h via INTRAVENOUS
  Filled 2021-02-24 (×4): qty 250

## 2021-02-24 MED ORDER — FOLIC ACID 5 MG/ML IJ SOLN
1.0000 mg | Freq: Every day | INTRAMUSCULAR | Status: DC
  Administered 2021-02-24 – 2021-03-01 (×6): 1 mg via INTRAVENOUS
  Filled 2021-02-24 (×9): qty 0.2

## 2021-02-24 MED ORDER — INSULIN ASPART 100 UNIT/ML IJ SOLN
0.0000 [IU] | Freq: Three times a day (TID) | INTRAMUSCULAR | Status: DC
  Administered 2021-02-24: 2 [IU] via SUBCUTANEOUS
  Administered 2021-02-25: 1 [IU] via SUBCUTANEOUS
  Filled 2021-02-24 (×3): qty 1

## 2021-02-24 MED ORDER — FUROSEMIDE 10 MG/ML IJ SOLN
80.0000 mg | Freq: Once | INTRAMUSCULAR | Status: AC
  Administered 2021-02-24: 80 mg via INTRAVENOUS
  Filled 2021-02-24: qty 8

## 2021-02-24 MED ORDER — THIAMINE HCL 100 MG/ML IJ SOLN
100.0000 mg | Freq: Every day | INTRAMUSCULAR | Status: DC
  Administered 2021-02-24 – 2021-03-01 (×6): 100 mg via INTRAVENOUS
  Filled 2021-02-24 (×6): qty 2

## 2021-02-24 MED ORDER — SODIUM CHLORIDE 0.9 % IV SOLN
200.0000 mg | Freq: Once | INTRAVENOUS | Status: AC
  Administered 2021-02-24: 200 mg via INTRAVENOUS
  Filled 2021-02-24: qty 40

## 2021-02-24 MED ORDER — SODIUM CHLORIDE 0.9 % IV SOLN
2.0000 g | Freq: Two times a day (BID) | INTRAVENOUS | Status: DC
  Filled 2021-02-24 (×2): qty 2

## 2021-02-24 MED ORDER — INSULIN ASPART 100 UNIT/ML IJ SOLN
0.0000 [IU] | Freq: Every day | INTRAMUSCULAR | Status: DC
  Filled 2021-02-24: qty 1

## 2021-02-24 MED ORDER — LACTATED RINGERS IV BOLUS
1000.0000 mL | Freq: Once | INTRAVENOUS | Status: AC
  Administered 2021-02-24: 1000 mL via INTRAVENOUS

## 2021-02-24 MED ORDER — ADULT MULTIVITAMIN W/MINERALS CH
1.0000 | ORAL_TABLET | Freq: Every day | ORAL | Status: DC
  Administered 2021-02-24 – 2021-02-27 (×2): 1 via ORAL
  Filled 2021-02-24 (×3): qty 1

## 2021-02-24 MED ORDER — SODIUM CHLORIDE 0.9 % IV SOLN
100.0000 mg | Freq: Every day | INTRAVENOUS | Status: DC
  Administered 2021-02-26 – 2021-02-27 (×2): 100 mg via INTRAVENOUS
  Filled 2021-02-24: qty 100
  Filled 2021-02-24 (×2): qty 20

## 2021-02-24 MED ORDER — SODIUM CHLORIDE 0.9 % IV SOLN
2.0000 g | Freq: Once | INTRAVENOUS | Status: AC
  Administered 2021-02-24: 2 g via INTRAVENOUS

## 2021-02-24 MED ORDER — HYDROCOD POLST-CPM POLST ER 10-8 MG/5ML PO SUER
5.0000 mL | Freq: Two times a day (BID) | ORAL | Status: DC | PRN
Start: 2021-02-24 — End: 2021-02-27

## 2021-02-24 MED ORDER — CHLORHEXIDINE GLUCONATE CLOTH 2 % EX PADS
6.0000 | MEDICATED_PAD | Freq: Every day | CUTANEOUS | Status: DC
  Administered 2021-02-24 – 2021-03-01 (×6): 6 via TOPICAL

## 2021-02-24 MED ORDER — HEPARIN BOLUS VIA INFUSION
5000.0000 [IU] | Freq: Once | INTRAVENOUS | Status: AC
  Administered 2021-02-24: 5000 [IU] via INTRAVENOUS
  Filled 2021-02-24: qty 5000

## 2021-02-24 MED ORDER — DOCUSATE SODIUM 100 MG PO CAPS: 100.0000 mg | ORAL_CAPSULE | Freq: Two times a day (BID) | ORAL | Status: DC | PRN

## 2021-02-24 NOTE — Progress Notes (Signed)
ANTICOAGULATION CONSULT NOTE  Pharmacy Consult for heparin Indication: DVT  Allergies  Allergen Reactions   Penicillins     Patient Measurements: Height: '6\' 3"'$  (190.5 cm) Weight: 76.6 kg (168 lb 14 oz) IBW/kg (Calculated) : 84.5 Heparin Dosing Weight: 76 kg  Vital Signs: Temp: 97.5 F (36.4 C) (09/08 1922) Temp Source: Rectal (09/08 1700) BP: 95/72 (09/08 1922) Pulse Rate: 82 (09/08 1922)  Labs: Recent Labs    03/15/2021 0144 02/26/2021 0250 02/20/2021 0455 03/14/2021 2023  HGB 12.7*  --   --   --   HCT 40.0  --   --   --   PLT 188  --   --   --   APTT  --  36  --   --   LABPROT  --  25.2*  --   --   INR  --  2.3*  --   --   HEPARINUNFRC  --   --   --  0.48  CREATININE  --  1.27*  --   --   CKTOTAL  --  62  --   --   TROPONINIHS 51*  --  48*  --      Estimated Creatinine Clearance: 51.9 mL/min (A) (by C-G formula based on SCr of 1.27 mg/dL (H)).   Medical History: Past Medical History:  Diagnosis Date   Atrial flutter ([place])    Coronary artery disease    Hyperlipidemia    Hypertension    TIA (transient ischemic attack)      Assessment: 80 year old male admitted after a fall at facility. History of atrial flutter, no anticoagulation at baseline. Patient found to be Covid positive and started on remdesivir and steroids. Ultrasound lower extremities positive symmetric bilateral acute, nonocclusive DVT involving the bilateral profunda femoral and bilateral posterior tibial and peroneal veins.  INR elevated at 2.3 on admission.  9/8 2023 HL = 0.48 Therapeutic x 1 @ 1200 units/hr   Goal of Therapy:  Heparin level 0.3-0.7 units/ml Monitor platelets by anticoagulation protocol: Yes   Plan:  Heparin Therapuetic x 1  Continue Heparin drip at 1200 units/hr Check HL at 2000 CBC daily while on heparin  Dorothe Pea, PharmD, BCPS Clinical Pharmacist 03/18/2021 9:03 PM

## 2021-02-24 NOTE — Progress Notes (Signed)
CODE SEPSIS - PHARMACY COMMUNICATION  **Broad Spectrum Antibiotics should be administered within 1 hour of Sepsis diagnosis**  Time Code Sepsis Called/Page Received: 0200  Antibiotics Ordered: Cefepime and Vancomycin  Time of 1st antibiotic administration: [place], PharmD, [facility] 02/19/2021 2:07 AM

## 2021-02-24 NOTE — ED Notes (Signed)
ED Provider at bedside for bedside US

## 2021-02-24 NOTE — ED Triage Notes (Signed)
Pt arrives via [facility] from [facility] care facility. Per EMS, Pt sustained unwitnessed fall at the facility. He had a bloody nose and swelling to the left knee. Bleeding from nose has subsided. He has dementia and is at baseline per staff. No blood thinners per staff at facility. En route CBG 151, bp 117/78. Pt is confused.      Pt is oriented to self only. Abrasion to the nose area, blood noted in the right nare. Swelling to bilateral lower extremities. He denies pain currently. Pt is cool to touch, unable to obtain oral temp.

## 2021-02-24 NOTE — ED Notes (Signed)
EDP spoke with patients daughter who states pt previously COVID + 3 weeks ago.

## 2021-02-24 NOTE — Progress Notes (Signed)
Remdesivir - Pharmacy Brief Note   O:  CXR: "Patchy peribronchial and peripheral asymmetric ground-glass opacity in both lungs has a typical appearance of COVID-19 pneumonia. Other viral/atypical respiratory infection is possible." SpO2: 90-100% on RA   A/P:  Remdesivir 200 mg IVPB once followed by 100 mg IVPB daily x 4 days.   Renda Rolls, PharmD, [facility] 03/09/2021 6:55 AM

## 2021-02-24 NOTE — Progress Notes (Addendum)
PHARMACY CONSULT NOTE - FOLLOW UP  Pharmacy Consult for Electrolyte Monitoring and Replacement   Recent Labs: Potassium (mmol/L)  Date Value  02/28/2021 3.5   Magnesium (mg/dL)  Date Value  12/29/2020 2.2   Calcium (mg/dL)  Date Value  02/23/2021 8.6 (L)   Albumin (g/dL)  Date Value  02/28/2021 2.3 (L)   Phosphorus (mg/dL)  Date Value  06/05/2020 3.1   Sodium (mmol/L)  Date Value  03/03/2021 136     Assessment: 80 year old male admitted after a fall at facility. History of atrial flutter, no anticoagulation at baseline. Patient found to be Covid positive and started on remdesivir and steroids. Also found to have DVT bilateral lower extremities. Pharmacy consult for electrolyte management.  Goal of Therapy:  Electrolytes WNL  Plan:  --No replacement indicated --Recheck all electrolytes with morning labs  Tawnya Crook, PharmD, BCPS Clinical Pharmacist 03/10/2021 11:35 AM

## 2021-02-24 NOTE — Progress Notes (Signed)
PHARMACY -  BRIEF ANTIBIOTIC NOTE   Pharmacy has received consult(s) for Cefepime and Vancomycin from an ED provider.  The patient's profile has been reviewed for ht/wt/allergies/indication/available labs.    One time order(s) placed for Cefepime 2 gm and Vancomycin 1 gm.  No pt wt available at this time.  Further antibiotics/pharmacy consults should be ordered by admitting physician if indicated.                       Thank you, Renda Rolls, PharmD, [facility] 02/21/2021 2:08 AM

## 2021-02-24 NOTE — TOC Initial Note (Signed)
Transition of Care [facility]) - Initial/Assessment Note    Patient Details  Name: Craig [name] MRN: [id number] Date of Birth: 05-05-1941  Transition of Care [facility]) CM/SW Contact:    Kerin Salen, RN Phone Number: 03/15/2021, 12:35 PM  Clinical Narrative: Attempted to call daughter, no answer left voice message to return call. Attempted other number on file, voice box is full.                       Patient Goals and CMS Choice        Expected Discharge Plan and Services                                                Prior Living Arrangements/Services                       Activities of Daily Living      Permission Sought/Granted                  Emotional Assessment              Admission diagnosis:  Lactic acidosis [E87.2] Hypothermia [T68.XXXA] Fall [W19.XXXA] Sepsis due to pneumonia ([place]) [J18.9, A41.9] Congestive heart failure, unspecified HF chronicity, unspecified heart failure type ([place]) [I50.9] COVID-19 [U07.1] Patient Active Problem List   Diagnosis Date Noted   Sepsis due to pneumonia ([place]) 02/20/2021   Acute on chronic heart failure ([place]) 12/24/2020   Acute on chronic HFrEF (heart failure with reduced ejection fraction) ([place]) 12/23/2020   Acute respiratory failure with hypoxia ([place]) 12/23/2020   HFrEF (heart failure with reduced ejection fraction) (HCC)    Atrial flutter with rapid ventricular response ([place]) 05/26/2020   SBO (small bowel obstruction) ([place]) 05/26/2020   Hypernatremia 05/26/2020   Nicotine dependence 05/26/2020   Protein-calorie malnutrition, severe 04/22/2020   Dementia with behavioral disturbance ([place]) 04/21/2020   Altered mental status 04/20/2020   Atrial flutter (HCC)    Bradycardia    CAD (coronary artery disease), native coronary artery 12/25/2017   History of coronary artery stent placement 12/25/2017   Hyperlipidemia 12/25/2017   Former smoker 12/25/2017   Essential hypertension  12/25/2017   Memory loss 12/25/2017   PCP:  Townsend Roger, MD Pharmacy:   [id number] [address]Phone: [phone]: [phone]    Social Determinants of Health (SDOH) Interventions    Readmission Risk Interventions Readmission Risk Prevention Plan 06/15/2020  Transportation Screening Complete  PCP or Specialist Appt within 3-5 Days Complete  [place]RI or [place](No Data)  Social Work Consult for [place]/Counseling [place] Applic[place]  Medication Review (Press photographer Complete  Some recent data might be hidden

## 2021-02-24 NOTE — Progress Notes (Signed)
*  PRELIMINARY RESULTS* Echocardiogram 2D Echocardiogram has been performed.  Craig [name] 03/14/2021, 2:30 PM

## 2021-02-24 NOTE — Sepsis Progress Note (Signed)
Elink following Code Sepsis. 

## 2021-02-24 NOTE — Progress Notes (Signed)
eLink Physician-Brief Progress Note Patient Name: Craig [name] DOB: 1940/06/25 MRN: [id number]   Date of Service  02/19/2021  HPI/Events of Note  Patient admitted from a skilled facility s/p a fall, and found to have Covid 19 pneumonia, sepsis, congestive heart failure secondary to diastolic dysfunction, AKI, and metabolic encephalopathy.  eICU Interventions  New Patient Evaluation.        Kerry Kass [name] 03/06/2021, 6:55 AM

## 2021-02-24 NOTE — H&P (Signed)
NAME:  Craig [name], MRN:  [id number], DOB:  [birth year], LOS: 0 ADMISSION DATE:  03/14/2021, CONSULTATION DATE: 03/04/2021 REFERRING MD: Michel Harrow, DO, CHIEF COMPLAINT: Unwitnessed fall   HPI  80 y.o with significant PMH as below who presented to the ED from [place] with unwitnessed fall.  Patient has history of dementia and poor historian.  History obtained from patient's chart.  Per EMS run sheet and ED notes, patient was found down after sustaining unwitnessed fall with injuries noted in the nasal bridge, and right nare, swelling to bilateral lower extremities and left knee.  Per staff at the facility, patient had been complaining of left knee pain prior to fall.  He was also noted with bleeding from his nose which has since subsided.  He is confused at baseline due to history of dementia.  ED Course: On arrival to the ED, he was hypothermic temp of(!) 94.8 F (34.9 C) with blood pressure 92/86 mm Hg and pulse rate (!) 110 beats/min, respiration 14 and oxygen saturation 98% on room air. There were no focal neurological deficits; he was alert and oriented to self only.  Initial pertinent labs/Diagnostics WBC/Hgb/Hct/Plts:  5.9/12.7/40.0/188 (09/08 0144)  CO2 17, glucose 123, BUN/creatinine 30/1.27, calcium 8.6, AST/ALT 67/52 total bilirubin 2.3, anion gap 17 UA: Pyuria there was no evidence of UTI.  Urine culture pending Lactate: 8.6>>7.4 Troponin: 51 PT/INR: 25.2/2.3 BNP > 4500 SARS Coronavirus 2 by RT PCR POSITIVE Abnormal  EKG: normal EKG, normal sinus rhythm, unchanged from previous tracings, ST depression in lateral leads. Imaging: CT head negative for acute intracranial abnormality however CT maxillofacial and cervical spine showed posttraumatic nasal septal deviation but no nasal fracture as well as mild L1 compression fracture without retropulsion.  Given initial concerns for sepsis, septic work-up was initiated and patient received IV fluids and  started on broad broad spectrum antibiotics.  Pro-Cal came back normal however BNP was markedly elevated concerning for cardiogenic in nature.  Bedside ultrasound was performed by EDP which revealed very poor cardiac squeeze.  Patient received IV Lasix.  PCCM consulted for admission to ICU for further work-up and management. Past Medical History  Acute on chronic HFrEF Atrial fibrillation SBO secondary to incarcerated right inguinal hernia and right spermatic cord abscess s/p right inguinal hernia repair, small bowel resection, and right orchiectomy-05/27/20 Dementia with behavioral disturbances Bradycardia CAD Hyperlipidemia Hypertension TIA  Significant Hospital Events   9/8: Admitted to ICU with sepsis secondary to COVID-pneumonia and traumatic fall  Consults:  PCCM  Procedures:  None  Significant Diagnostic Tests:  9/8: Chest Xray>mild interstitial edema 9/8: Noncontrast CT head> no acute intracranial abnormality 9/8: CTA Chest, abdomen and pelvis>Patchy peribronchial and peripheral asymmetric ground-glass opacity in both lungs has a typical appearance of COVID-19 pneumonia.  Micro Data:  9/8: SARS-CoV-2 PCR> positive 9/8: Influenza PCR> negative 9/8: Blood culture x2> 9/8: Urine Culture> 9/80: MRSA PCR>>  9/8: Strep pneumo urinary antigen> 9/8: Legionella urinary antigen> 9/8: Mycoplasma pneumonia>  Antimicrobials:  Vancomycin 9/8>> Cefepime 9/8>>  OBJECTIVE  Blood pressure 105/73, pulse 67, temperature (!) 95.3 F (35.2 C), temperature source Rectal, resp. rate 14, weight 79.6 kg, SpO2 98 %.        Intake/Output Summary (Last 24 hours) at 03/08/2021 0554 Last data filed at 02/26/2021 0352 Gross per 24 hour  Intake 3300 ml  Output --  Net 3300 ml   Filed Weights   03/01/2021 0251  Weight: 79.6 kg    Physical Examination  GENERAL: 80 year-old  critically ill patient lying in the bed with no acute distress.  EYES: Pupils equal, round, reactive to light and  accommodation. No scleral icterus. Extraocular muscles intact.  HEENT: Head atraumatic, normocephalic. Oropharynx and nasopharynx with dry blood and bruising NECK:  Supple, no jugular venous distention. No thyroid enlargement, no tenderness.  LUNGS: Normal breath sounds bilaterally, no wheezing, rales,rhonchi or crepitation. No use of accessory muscles of respiration.  CARDIOVASCULAR: S1, S2 normal. No murmurs, rubs, or gallops.  ABDOMEN: Soft, nontender, nondistended. Bowel sounds present. No organomegaly or mass.  EXTREMITIES: 3+ pitting edema, bilateral knee swelling L>R. No cyanosis, or clubbing.  NEUROLOGIC: Cranial nerves II through XII are intact.  Muscle strength 4/5 in BUE extremities. Sensation intact. Gait not checked.  PSYCHIATRIC: The patient is alert and oriented x 1  SKIN: No obvious rash, lesion, or ulcer.   Labs/imaging that I havepersonally reviewed  (right click and "Reselect all SmartList Selections" daily)     Labs   CBC: Recent Labs  Lab 03/13/2021 0144  WBC 5.9  NEUTROABS 5.0  HGB 12.7*  HCT 40.0  MCV 106.7*  PLT 0000000    Basic Metabolic Panel: Recent Labs  Lab 03/01/2021 0250  NA 136  K 3.5  CL 102  CO2 17*  GLUCOSE 123*  BUN 30*  CREATININE 1.27*  CALCIUM 8.6*   GFR: Estimated Creatinine Clearance: 54 mL/min (A) (by C-G formula based on SCr of 1.27 mg/dL (H)). Recent Labs  Lab 03/06/2021 0144 02/25/2021 0250 03/03/2021 0455  PROCALCITON  --  <0.10  --   WBC 5.9  --   --   LATICACIDVEN 8.6*  --  7.4*    Liver Function Tests: Recent Labs  Lab 02/21/2021 0250  AST 67*  ALT 52*  ALKPHOS 74  BILITOT 2.3*  PROT 7.3  ALBUMIN 2.3*   Recent Labs  Lab 03/16/2021 0250  LIPASE 30   No results for input(s): AMMONIA in the last 168 hours.  ABG    Component Value Date/Time   HCO3 31.4 (H) 05/26/2020 2024   O2SAT 65.9 05/26/2020 2024     Coagulation Profile: Recent Labs  Lab 02/21/2021 0250  INR 2.3*    Cardiac Enzymes: Recent Labs  Lab  03/17/2021 0250  CKTOTAL 62    HbA1C: No results found for: HGBA1C  CBG: No results for input(s): GLUCAP in the last 168 hours.  Review of Systems:   Unable to obtain due to altered mental status  Past Medical History  He,  has a past medical history of Atrial flutter ([place]), Coronary artery disease, Hyperlipidemia, Hypertension, and TIA (transient ischemic attack).   Surgical History    Past Surgical History:  Procedure Laterality Date   arm surgery Left    BOWEL RESECTION  05/27/2020   Procedure: SMALL BOWEL RESECTION;  Surgeon: Ronny Bacon, MD;  Location: [facility] ORS;  Service: General;;   HERNIA REPAIR     INGUINAL HERNIA REPAIR Right 05/27/2020   Procedure: HERNIA REPAIR INGUINAL INCARCERATED;  Surgeon: Ronny Bacon, MD;  Location: [facility] ORS;  Service: General;  Laterality: Right;   ORCHIECTOMY Right 05/27/2020   Procedure: ORCHIECTOMY;  Surgeon: Ronny Bacon, MD;  Location: [facility] ORS;  Service: General;  Laterality: Right;     Social History   reports that he has quit smoking. His smokeless tobacco use includes snuff. He reports current alcohol use. He reports that he does not use drugs.   Family History   His Family history is unknown by patient.   Allergies  Allergies  Allergen Reactions   Penicillins      Home Medications  Prior to Admission medications   Medication Sig Start Date End Date Taking? Authorizing Provider  acetaminophen (TYLENOL) 650 MG CR tablet Take 650 mg by mouth every 8 (eight) hours as needed for pain.    [provider]  aspirin EC 81 MG tablet Take 81 mg by mouth daily. Swallow whole.    [provider]  busPIRone (BUSPAR) 5 MG tablet Take 5 mg by mouth 3 (three) times daily.    [provider]  donepezil (ARICEPT) 10 MG tablet Take 10 mg by mouth at bedtime. 12/18/17   [provider]  feeding supplement (ENSURE ENLIVE / ENSURE PLUS) LIQD Take 237 mLs by mouth 3 (three) times daily between meals.  04/26/20   Lorella Nimrod, MD  furosemide (LASIX) 20 MG tablet Take 3 tablets (60 mg total) by mouth daily. 01/07/21   Alma Friendly, MD  losartan (COZAAR) 25 MG tablet Take 1 tablet (25 mg total) by mouth daily. 06/15/20   Lorella Nimrod, MD  metoprolol succinate (TOPROL-XL) 50 MG 24 hr tablet Take 50 mg by mouth daily. Take with or immediately following a meal.    [provider]  Multiple Vitamin (MULTIVITAMIN WITH MINERALS) TABS tablet Take 1 tablet by mouth daily. 04/27/20   Lorella Nimrod, MD  OVER THE COUNTER MEDICATION 1 application. Apply compression stockings in the morning and remove at bedtime    [provider]  pantoprazole (PROTONIX) 40 MG tablet Take 1 tablet (40 mg total) by mouth daily. 06/15/20   Lorella Nimrod, MD  simethicone (MYLICON) 80 MG chewable tablet Chew 80 mg by mouth every 6 (six) hours as needed for flatulence.    [provider]  spironolactone (ALDACTONE) 25 MG tablet Take 1 tablet (25 mg total) by mouth daily. 01/06/21 02/05/21  Alma Friendly, MD  Scheduled Meds: Continuous Infusions:  lactated ringers 150 mL/hr at 02/21/2021 0310   PRN Meds:.docusate sodium, polyethylene glycol  Active Hospital Problem list   Sepsis due to COVID-pneumonia AKI Anion gap metabolic acidosis Elevated LFTs Elevated troponin Acute on chronic congestive heart failure  Assessment & Plan:  Sepsis without septic shock due to suspected COVID-pneumonia CT chest shows Patchy peribronchial and peripheral asymmetric ground-glass opacity in both lungs has a typical appearance of COVID-19 pneumonia. -Supplemental O2 as needed to maintain O2 saturations 88 to 92% -Follow intermittent ABG and chest x-ray as needed -As needed bronchodilators -Stat remdesivir x5 days  -Continue Empiric abx coverage given risk factors (nursing home resident) with vancomycin + cefepime pending cultures, may broaden if appropriate -f/u cultures, trend lactic/ PCT -Check strep  pneumo and Legionella -Steroids initiated -Encourage OOB, IS, FV, and awake proning if able -Continue airborne, contact precautions for 21 days from positive testing. -Monitor CMP and inflammatory markers -Consider vasopressors to keep MAP greater than 65   Acute on chronic Systolic CHF (last known EF <20%) BNP>4500 Cardiogenic shock -Hypertension -Elevated troponins likely demand ischemia Hx: CAD status post stents, A-fib, HLD  -Trend troponins -Continuous cardiac monitoring -Maintain MAP greater than 65 -Currently not on any anticoagulation for A. fib, hold antiplatelets and anticoagulation in the setting of elevated PT/INR and nosebleed -IV Lasix as blood pressure and renal function permits; currently on Lasix 20 mg  -Hold losartan, spironolactone and metoprolol in the setting of sepsis and hypotension -Repeat 2D Echocardiogram  Acute kidney injury Likely prerenal in the setting of above -Monitor I&O's / urinary  output -Follow BMP -Ensure adequate renal perfusion -Avoid nephrotoxic agents as able -Replace electrolytes as indicated  Anion gap metabolic acidosis Lactic acidosis -Trend lactic acid -Bicarb as needed   Elevated LFTs likely in the setting of above -CT abdomen pelvis cholelithiasis no evidence of cholecystitis -Check hepatitis panel if appropriate -Lipid profile, CK and TSH -Will continue to follow   Acute Metabolic Encephalopathy due to above Underlying history of dementia -CT head negative for acute intracranial abnormality -Provide supportive care -Avoid sedative as able -Hold donepezil due to bradycardia    Best practice:  Diet:  NPO Pain/Anxiety/Delirium protocol (if indicated): No VAP protocol (if indicated): Not indicated DVT prophylaxis: Contraindicated GI prophylaxis: PPI Glucose control:  SSI Yes Central venous access:  N/A Arterial line:  N/A Foley:  Yes, and it is still needed Mobility:  bed rest  PT consulted: N/A Last date of  multidisciplinary goals of care discussion [9/8] Code Status:  full code Disposition: ICU   = Goals of Care = Code Status Order: FULL  Primary Emergency Contact: Viti,Jamie Wishes to pursue full aggressive treatment and intervention options, including CPR and intubation, but goals of care will be addressed on going with family if that should become necessary.   Critical care time: 45 minutes     Rufina Falco, DNP, CCRN, FNP-C, AGACNP-BC Acute Care Nurse Practitioner  [facility] Pager: [phone] [place] [facility]  .

## 2021-02-24 NOTE — Progress Notes (Signed)
Pt oriented to self only, follows commands. VSS. Heparin gtt started per orders. Pt re-positioned several times throughout the shift but will not stay in other positions. Pt only lays on L side. Educated and re-positioned throughout shift.

## 2021-02-24 NOTE — Progress Notes (Signed)
Pharmacy Antibiotic Note  Craig [name] is a 80 y.o. male admitted on 03/09/2021 with sepsis.  Pharmacy has been consulted for Cefepime and Vancomycin dosing.  Plan: Ordered Cefepime 2 gm q12h per indication and renal function.  Vancomycin Pt given Vanc 1 gm in ED Vancomycin 1250 mg IV Q 24 hrs.  Goal AUC 400-550. Expected AUC: 477.1 SCr used: 1.27, TBW 76.6 kg < IBW 84.5 kg  Pharmacy will continue to follow and will adjust dose when warranted.  Height: '6\' 3"'$  (190.5 cm) Weight: 76.6 kg (168 lb 14 oz) IBW/kg (Calculated) : 84.5  Temp (24hrs), Avg:95.1 F (35.1 C), Min:94.8 F (34.9 C), Max:95.3 F (35.2 C)  Recent Labs  Lab 02/28/2021 0144 03/10/2021 0250 02/26/2021 0455  WBC 5.9  --   --   CREATININE  --  1.27*  --   LATICACIDVEN 8.6*  --  7.4*    Estimated Creatinine Clearance: 51.9 mL/min (A) (by C-G formula based on SCr of 1.27 mg/dL (H)).    Allergies  Allergen Reactions   Penicillins     Antimicrobials this admission: 9/8 Cefepime >>  9//8 Vancomycin >>   Microbiology results: 9/8 BCx: Pending 9/8 UCx: Pending   Thank you for allowing pharmacy to be a part of this patient's care.  Renda Rolls, PharmD, [facility] 02/23/2021 7:13 AM

## 2021-02-24 NOTE — ED Notes (Signed)
CBG: 80 

## 2021-02-24 NOTE — Progress Notes (Signed)
ANTICOAGULATION CONSULT NOTE  Pharmacy Consult for heparin Indication: DVT  Allergies  Allergen Reactions   Penicillins     Patient Measurements: Height: '6\' 3"'$  (190.5 cm) Weight: 76.6 kg (168 lb 14 oz) IBW/kg (Calculated) : 84.5 Heparin Dosing Weight: 76 kg  Vital Signs: Temp: 95.9 F (35.5 C) (09/08 0800) Temp Source: Rectal (09/08 0800) BP: 97/84 (09/08 0800) Pulse Rate: 45 (09/08 0800)  Labs: Recent Labs    03/06/2021 0144 02/23/2021 0250 03/17/2021 0455  HGB 12.7*  --   --   HCT 40.0  --   --   PLT 188  --   --   APTT  --  36  --   LABPROT  --  25.2*  --   INR  --  2.3*  --   CREATININE  --  1.27*  --   CKTOTAL  --  62  --   TROPONINIHS 51*  --  48*    Estimated Creatinine Clearance: 51.9 mL/min (A) (by C-G formula based on SCr of 1.27 mg/dL (H)).   Medical History: Past Medical History:  Diagnosis Date   Atrial flutter ([place])    Coronary artery disease    Hyperlipidemia    Hypertension    TIA (transient ischemic attack)      Assessment: 80 year old male admitted after a fall at facility. History of atrial flutter, no anticoagulation at baseline. Patient found to be Covid positive and started on remdesivir and steroids. Ultrasound lower extremities positive symmetric bilateral acute, nonocclusive DVT involving the bilateral profunda femoral and bilateral posterior tibial and peroneal veins.  INR elevated at 2.3 on admission.  Goal of Therapy:  Heparin level 0.3-0.7 units/ml Monitor platelets by anticoagulation protocol: Yes   Plan:  Heparin 5000 unit bolus Heparin drip at 1200 units/hr Check HL at 2000 CBC daily while on heparin  Tawnya Crook, PharmD, BCPS Clinical Pharmacist 02/19/2021 11:15 AM

## 2021-02-24 NOTE — ED Notes (Signed)
Patient transported to CT 

## 2021-02-24 NOTE — ED Provider Notes (Signed)
[facility] Emergency Department Provider Note ____________________________________________   Event Date/Time   First MD Initiated Contact with Patient 03/03/2021 0159     (approximate)  I have reviewed the triage vital signs and the nursing notes.   HISTORY  Chief Complaint Fall    HPI Craig [name] is a 80 y.o. male with history of dementia, hypertension, hyperlipidemia, diabetes, coronary artery disease, atrial flutter, CHF who presents to the emergency department from [place] after he had an unwitnessed fall.  I spoke with Vicente Serene at the nursing facility who reports patient was found on the floor in his room with bleeding coming from his nose and complaining of left knee pain with associated swelling.  He states patient is very confused at baseline.  Patient is on aspirin but no anticoagulation.  Patient unable to provide any history.         Past Medical History:  Diagnosis Date   Atrial flutter ([place])    Coronary artery disease    Hyperlipidemia    Hypertension    TIA (transient ischemic attack)     Patient Active Problem List   Diagnosis Date Noted   Sepsis due to pneumonia ([place]) 03/17/2021   Acute on chronic heart failure ([place]) 12/24/2020   Acute on chronic HFrEF (heart failure with reduced ejection fraction) ([place]) 12/23/2020   Acute respiratory failure with hypoxia ([place]) 12/23/2020   HFrEF (heart failure with reduced ejection fraction) ([facility])    Atrial flutter with rapid ventricular response ([place]) 05/26/2020   SBO (small bowel obstruction) ([place]) 05/26/2020   Hypernatremia 05/26/2020   Nicotine dependence 05/26/2020   Protein-calorie malnutrition, severe 04/22/2020   Dementia with behavioral disturbance ([place]) 04/21/2020   Altered mental status 04/20/2020   Atrial flutter ([place])    Bradycardia    CAD (coronary artery disease), native coronary artery 12/25/2017   History of coronary artery stent placement 12/25/2017    Hyperlipidemia 12/25/2017   Former smoker 12/25/2017   Essential hypertension 12/25/2017   Memory loss 12/25/2017    Past Surgical History:  Procedure Laterality Date   arm surgery Left    BOWEL RESECTION  05/27/2020   Procedure: SMALL BOWEL RESECTION;  Surgeon: Ronny Bacon, MD;  Location: [facility] ORS;  Service: General;;   HERNIA REPAIR     INGUINAL HERNIA REPAIR Right 05/27/2020   Procedure: HERNIA REPAIR INGUINAL INCARCERATED;  Surgeon: Ronny Bacon, MD;  Location: [facility] ORS;  Service: General;  Laterality: Right;   ORCHIECTOMY Right 05/27/2020   Procedure: ORCHIECTOMY;  Surgeon: Ronny Bacon, MD;  Location: [facility] ORS;  Service: General;  Laterality: Right;    Prior to Admission medications   Medication Sig Start Date End Date Taking? Authorizing Provider  acetaminophen (TYLENOL) 650 MG CR tablet Take 650 mg by mouth every 8 (eight) hours as needed for pain.    [provider]  aspirin EC 81 MG tablet Take 81 mg by mouth daily. Swallow whole.    [provider]  busPIRone (BUSPAR) 5 MG tablet Take 5 mg by mouth 3 (three) times daily.    [provider]  donepezil (ARICEPT) 10 MG tablet Take 10 mg by mouth at bedtime. 12/18/17   [provider]  feeding supplement (ENSURE ENLIVE / ENSURE PLUS) LIQD Take 237 mLs by mouth 3 (three) times daily between meals. 04/26/20   Lorella Nimrod, MD  furosemide (LASIX) 20 MG tablet Take 3 tablets (60 mg total) by mouth daily. 01/07/21   Alma Friendly, MD  losartan (  COZAAR) 25 MG tablet Take 1 tablet (25 mg total) by mouth daily. 06/15/20   Lorella Nimrod, MD  metoprolol succinate (TOPROL-XL) 50 MG 24 hr tablet Take 50 mg by mouth daily. Take with or immediately following a meal.    [provider]  Multiple Vitamin (MULTIVITAMIN WITH MINERALS) TABS tablet Take 1 tablet by mouth daily. 04/27/20   Lorella Nimrod, MD  OVER THE COUNTER MEDICATION 1 application. Apply compression stockings in the morning  and remove at bedtime    [provider]  pantoprazole (PROTONIX) 40 MG tablet Take 1 tablet (40 mg total) by mouth daily. 06/15/20   Lorella Nimrod, MD  simethicone (MYLICON) 80 MG chewable tablet Chew 80 mg by mouth every 6 (six) hours as needed for flatulence.    [provider]  spironolactone (ALDACTONE) 25 MG tablet Take 1 tablet (25 mg total) by mouth daily. 01/06/21 02/05/21  Alma Friendly, MD    Allergies Penicillins  Family History  Family history unknown: Yes    Social History Social History   Tobacco Use   Smoking status: Former    Packs/day: 0.00    Types: Cigarettes   Smokeless tobacco: Current    Types: Snuff  Substance Use Topics   Alcohol use: Yes   Drug use: No    Review of Systems Level 5 caveat secondary to dementia   ____________________________________________   PHYSICAL EXAM:  VITAL SIGNS: ED Triage Vitals  Enc Vitals Group     BP 02/26/2021 0130 92/86     Pulse Rate 02/28/2021 0130 (!) 110     Resp 02/17/2021 0130 14     Temp 02/23/2021 0130 (!) 94.8 F (34.9 C)     Temp Source 02/22/2021 0130 Rectal     SpO2 02/27/2021 0130 100 %     Weight 02/22/2021 0251 175 lb 6.4 oz (79.6 kg)     Height --      Head Circumference --      Peak Flow --      Pain Score 02/25/2021 0201 0     Pain Loc --      Pain Edu? --      Excl. in [place]? --    CONSTITUTIONAL: Alert but unable to answer questions or follow commands.  Elderly. HEAD: Normocephalic; abrasion and swelling to the nose EYES: Conjunctivae clear, PERRL, EOMI ENT: normal nose; no rhinorrhea; moist mucous membranes; pharynx without lesions noted; no dental injury but does have multiple missing teeth; no septal hematoma, small amount of blood noted in bilateral nares NECK: Supple, no meningismus, no LAD; no midline spinal tenderness, step-off or deformity; trachea midline CARD: Regular and tachycardic; S1 and S2 appreciated; no murmurs, no clicks, no rubs, no gallops RESP: Normal chest  excursion without splinting or tachypnea; breath sounds clear and equal bilaterally; no wheezes, no rhonchi, no rales; no hypoxia or respiratory distress CHEST:  chest wall stable, no crepitus or ecchymosis or deformity, nontender to palpation; no flail chest ABD/GI: Normal bowel sounds; non-distended; soft, non-tender, no rebound, no guarding; no ecchymosis or other lesions noted GU:  Normal external genitalia, circumcised male, normal penile shaft, no blood or discharge at the urethral meatus, no testicular masses or tenderness on exam, no scrotal masses or swelling, no hernias appreciated, 2+ femoral pulses bilaterally; no perineal erythema, warmth, subcutaneous air or crepitus; no high riding testicle, normal bilateral cremasteric reflex.  Chaperone present for exam. PELVIS:  stable, nontender to palpation BACK:  The back appears normal and is  non-tender to palpation, there is no CVA tenderness; no midline spinal tenderness, step-off or deformity EXT: Patient has tenderness to palpation over the left knee with large joint effusion and soft tissue swelling without ecchymosis, redness or warmth. SKIN: Normal color for age and race; patient looks extremely cool to touch NEURO: Moves all extremities equally, garbled speech, no obvious facial asymmetry, confused   ____________________________________________   LABS (all labs ordered are listed, but only abnormal results are displayed)  Labs Reviewed  RESP PANEL BY RT-PCR (FLU A&B, COVID) ARPGX2 - Abnormal; Notable for the following components:      Result Value   SARS Coronavirus 2 by RT PCR POSITIVE (*)    All other components within normal limits  LACTIC ACID, PLASMA - Abnormal; Notable for the following components:   Lactic Acid, Venous 8.6 (*)    All other components within normal limits  LACTIC ACID, PLASMA - Abnormal; Notable for the following components:   Lactic Acid, Venous 7.4 (*)    All other components within normal limits  CBC  WITH DIFFERENTIAL/PLATELET - Abnormal; Notable for the following components:   RBC 3.75 (*)    Hemoglobin 12.7 (*)    MCV 106.7 (*)    RDW 19.6 (*)    nRBC 5.6 (*)    Lymphs Abs 0.6 (*)    All other components within normal limits  URINALYSIS, COMPLETE (UACMP) WITH MICROSCOPIC - Abnormal; Notable for the following components:   APPearance CLOUDY (*)    Glucose, UA 500 (*)    Bilirubin Urine SMALL (*)    Protein, ur 30 (*)    Bacteria, UA RARE (*)    All other components within normal limits  COMPREHENSIVE METABOLIC PANEL - Abnormal; Notable for the following components:   CO2 17 (*)    Glucose, Bld 123 (*)    BUN 30 (*)    Creatinine, Ser 1.27 (*)    Calcium 8.6 (*)    Albumin 2.3 (*)    AST 67 (*)    ALT 52 (*)    Total Bilirubin 2.3 (*)    GFR, Estimated 58 (*)    Anion gap 17 (*)    All other components within normal limits  PROTIME-INR - Abnormal; Notable for the following components:   Prothrombin Time 25.2 (*)    INR 2.3 (*)    All other components within normal limits  BRAIN NATRIURETIC PEPTIDE - Abnormal; Notable for the following components:   B Natriuretic Peptide >4,500.0 (*)    All other components within normal limits  TROPONIN I (HIGH SENSITIVITY) - Abnormal; Notable for the following components:   Troponin I (High Sensitivity) 51 (*)    All other components within normal limits  TROPONIN I (HIGH SENSITIVITY) - Abnormal; Notable for the following components:   Troponin I (High Sensitivity) 48 (*)    All other components within normal limits  CULTURE, BLOOD (ROUTINE X 2)  CULTURE, BLOOD (ROUTINE X 2)  URINE CULTURE  CK  LIPASE, BLOOD  PROCALCITONIN  APTT  PATHOLOGIST SMEAR REVIEW  LEGIONELLA PNEUMOPHILA SEROGP 1 UR AG  STREP PNEUMONIAE URINARY ANTIGEN  CBG MONITORING, ED   ____________________________________________  EKG   Date: 02/20/2021 1:38 AM  Rate: 82  Rhythm: normal sinus rhythm  QRS Axis: normal  Intervals: normal  ST/T Wave  abnormalities: ST depression in lateral leads  Conduction Disutrbances: none  Narrative Interpretation: Artifact, ST depression in lateral leads    ____________________________________________  RADIOLOGY I, Dilia Alemany, personally viewed  and evaluated these images (plain radiographs) as part of my medical decision making, as well as reviewing the written report by the radiologist.  ED MD interpretation: CT head, face and cervical spine show posttraumatic septal deviation.  CT of the chest shows groundglass opacities consistent with edema versus COVID-pneumonia.  Official radiology report(s): CT HEAD WO CONTRAST  Result Date: 02/23/2021 CLINICAL DATA:  80 year old male status post unwitnessed fall. Epistaxis. Dementia. EXAM: CT HEAD WITHOUT CONTRAST TECHNIQUE: Contiguous axial images were obtained from the base of the skull through the vertex without intravenous contrast. COMPARISON:  Head CT 04/20/2020. FINDINGS: Brain: Chronically advanced and confluent bilateral cerebral white matter hypodensity has not significantly changed from last year. Chronic basal ganglia vascular calcifications. No midline shift, ventriculomegaly, mass effect, evidence of mass lesion, intracranial hemorrhage or evidence of cortically based acute infarction. Stable dural calcifications including along the falx. Vascular: Calcified atherosclerosis at the skull base. No suspicious intracranial vascular hyperdensity. Skull: Calvarium is intact.  Facial bones are reported separately. Sinuses/Orbits: Increased paranasal sinus mucosal thickening, with fluid and bubbly opacity now in the left frontal sinus and both frontoethmoidal recesses. Sphenoid and right maxillary sinuses remain clear. Tympanic cavities and mastoids are clear. Other: No orbit or scalp soft tissue injury identified. See also face CT reported separately. IMPRESSION: 1. No acute intracranial abnormality or acute traumatic injury identified. 2. Chronically  advanced cerebral white matter disease. 3. See also Face CT reported separately. Electronically Signed   By: Genevie Ann M.D.   On: 03/16/2021 05:11   CT CERVICAL SPINE WO CONTRAST  Result Date: 02/23/2021 CLINICAL DATA:  80 year old male status post unwitnessed fall. Epistaxis. Dementia. EXAM: CT CERVICAL SPINE WITHOUT CONTRAST TECHNIQUE: Multidetector CT imaging of the cervical spine was performed without intravenous contrast. Multiplanar CT image reconstructions were also generated. COMPARISON:  CT head and face today. FINDINGS: Alignment: Straightening of cervical lordosis. Mild anterolisthesis of C7 on T1 is associated with facet hypertrophy which is greater on the left. Bilateral posterior element alignment is within normal limits. Skull base and vertebrae: Visualized skull base is intact. No atlanto-occipital dissociation. C1 and C2 appear intact and aligned. No acute osseous abnormality identified. Soft tissues and spinal canal: No prevertebral fluid or swelling. No visible canal hematoma. Fairly abundant scattered but intravenous appearing neck soft tissue gas, likely related to recent IV access. Disc levels: Widespread advanced cervical spine degeneration is superimposed on congenital appearing spinal canal narrowing. Subsequently there is multilevel spinal stenosis which is at least moderate at C3-C4 through C5-C6. Upper chest: There is patchy opacity in the left lung apex. Visible upper thoracic levels appear grossly intact. IMPRESSION: 1. No acute traumatic injury identified in the cervical spine. Mild degenerative appearing anterolisthesis of C7 on T1. 2. 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 Subsequent widespread cervical spinal stenosis which is suspected to be moderate or greater C3-C4 through C5-C6. 3. Patchy opacity in the left lung apex. See Chest CT reported separately. Electronically Signed   By: Genevie Ann M.D.   On: 03/08/2021 05:21    CT CHEST ABDOMEN PELVIS W CONTRAST  Result Date: 03/06/2021 CLINICAL DATA:  80 year old male status post unwitnessed fall. Epistaxis. Dementia. Possible sepsis. EXAM: CT CHEST, ABDOMEN, AND PELVIS WITH CONTRAST TECHNIQUE: Multidetector CT imaging of the chest, abdomen and pelvis was performed following the standard protocol during bolus administration of intravenous contrast. CONTRAST:  4m OMNIPAQUE IOHEXOL 350 MG/ML SOLN COMPARISON:  Cervical spine CT today. Chest CT 04/24/2020. CT Abdomen and Pelvis  04/20/2020. FINDINGS: CT CHEST FINDINGS Cardiovascular: Cardiomegaly has substantially progressed since last year. See series 2, image 43. And there is a somewhat stagnant appearance of left extremity and subclavian injected IV contrast plus some contrast reflux into the hepatic IVC and hepatic veins suggesting a degree of heart failure. There are also enhancing left shoulder and paravertebral venous collaterals, and a small volume of intravenous gas at the neck. Calcified coronary artery atherosclerosis. Calcified aortic atherosclerosis. There is little contrast in the aorta. No pericardial effusion. Mediastinum/Nodes: No mediastinal hematoma or lymphadenopathy identified. Lungs/Pleura: Scattered patchy but confluent ground-glass opacity in both lungs which is both peripheral and peribronchial. Only the right upper lobe is relatively spared. Major airways remain patent. No consolidation or pleural effusion. There is some underlying centrilobular emphysema. No pneumothorax. Musculoskeletal: Respiratory motion artifact and osteopenia limit rib evaluation. Chronic left 7th through 9th rib fractures are redemonstrated. Healing fracture of the left posterolateral 10th rib is new since last year. A healing fracture of the right 12th rib is new since last year. Chronic anterior right 10th rib deformity appears stable. There is no obvious acute rib fracture. Sternum, visible shoulder osseous structures, and thoracic  vertebrae also appear to remain intact. CT ABDOMEN PELVIS FINDINGS Hepatobiliary: Reflux of contrast from the right heart to the IVC and hepatic veins, with otherwise little liver enhancement. No liver injury is evident. Grossly negative gallbladder. Pancreas: Essentially noncontrast appearance of the pancreas is negative. Spleen: Limited by motion artifact and lack of contrast. No obvious splenic injury. Adrenals/Urinary Tract: Nephrographic enhancement on the delayed images which also demonstrate abdominal aorta and other visceral enhancement. Normal adrenal glands. The kidneys appear symmetric, nonobstructed, and intact. Diminutive, unremarkable bladder. Stomach/Bowel: Generalized mesenteric congestion, edema. Retained air and stool in the rectum, but no dilated large or small bowel elsewhere. No free air or free fluid is identified. But otherwise bowel detail is limited. Vascular/Lymphatic: Extensive Aortoiliac calcified atherosclerosis. Normal caliber abdominal aorta. There is little intravascular contrast in the abdomen and pelvis. No lymphadenopathy identified. Reproductive: Sequelae of prostate brachytherapy again noted. Other: Generalized body wall edema is new from last year. No definite pelvic free fluid. Musculoskeletal: Mild L1 superior endplate compression fracture is new since last year. Less than 25% loss of vertebral body height with no retropulsion or complicating features. L2 vertebral body lucency which is likely hemangioma related is stable. Left ulna ORIF is visible. The sacrum, pelvis, and proximal femurs appear intact. IMPRESSION: 1. Patchy peribronchial and peripheral asymmetric ground-glass opacity in both lungs has a typical appearance of COVID-19 pneumonia. Other viral/atypical respiratory infection is possible. 2. Progressed cardiomegaly since last year with evidence of heart failure, including: Reflux of contrast into the liver, delayed systemic arterial enhancement, anasarca. 3. Mild  L1 superior endplate compression fracture is new since last year and may be acute. No retropulsion or complicating features. 4. But the study is limited by motion artifact with no other acute traumatic injury identified in the chest, abdomen, or pelvis. - osteopenia. - healing fractures of the left 10th and right 12th ribs are new from last year. 5. Extensive Aortic atherosclerosis (ICD10-I70.0). Electronically Signed   By: Genevie Ann M.D.   On: 02/21/2021 05:35   DG Chest Port 1 View  Result Date: 02/28/2021 CLINICAL DATA:  Questionable sepsis EXAM: PORTABLE CHEST 1 VIEW COMPARISON:  12/23/2020 FINDINGS: Cardiomegaly. Mild interstitial prominence could reflect interstitial edema. No confluent airspace opacity or effusion. IMPRESSION: Cardiomegaly.  Suspect mild interstitial edema. Electronically Signed   By: Lennette Bihari  [place] M.D.   On: 03/06/2021 02:35   DG Knee Complete 4 Views Left  Result Date: 03/11/2021 CLINICAL DATA:  Fall, left knee swelling EXAM: LEFT KNEE - COMPLETE 4+ VIEW COMPARISON:  None. FINDINGS: Anterior soft tissue swelling. No acute bony abnormality. Specifically, no fracture, subluxation, or dislocation. Joint spaces maintained. No joint effusion. IMPRESSION: No acute bony abnormality. Electronically Signed   By: Rolm Baptise M.D.   On: 03/08/2021 02:35   CT MAXILLOFACIAL WO CONTRAST  Result Date: 02/19/2021 CLINICAL DATA:  80 year old male status post unwitnessed fall. Epistaxis. Dementia. EXAM: CT MAXILLOFACIAL WITHOUT CONTRAST TECHNIQUE: Multidetector CT imaging of the maxillofacial structures was performed. Multiplanar CT image reconstructions were also generated. COMPARISON:  Head CT today.  Head CT 04/20/2020. FINDINGS: Osseous: Mandible is intact and normally located with some bilateral TMJ degeneration. Scattered dental caries. Maxilla remain intact. No zygoma fracture. No pterygoid fracture. Bilateral nasal bones appear to remain intact but there is trace posttraumatic appearing soft  tissue gas along the bridge of the nose and anterior nasal septum series 3, image 33. Intact central skull base.  Cervical spine reported separately. Orbits: No orbital wall fracture. Orbits soft tissues appears symmetric and within normal limits. Sinuses: Rightward nasal septal deviation is age indeterminate and might be posttraumatic. But the nasal cavity is well aerated. Some of the bubbly opacity in the bilateral frontoethmoidal recesses and left frontal sinus might be hemorrhage. But there is also trace mucosal thickening. Right maxillary, posterior ethmoid, sphenoid sinuses, tympanic cavities and mastoids are clear. Soft tissues: A small volume of gas in the bilateral submandibular space appears to be intravenous, likely related to recent IV access. No deep soft tissue space traumatic injury is evident in the absence of IV contrast. There is mild cervical carotid calcified atherosclerosis. Limited intracranial: Stable to that reported separately today. IMPRESSION: 1. Trace posttraumatic soft tissue gas at the bridge of the nose and anterior nasal septum - with possible acute posttraumatic septal deviation. 2. But no nasal bone or other acute facial fracture is identified. 3. Trace soft tissue gas in the bilateral neck appears to be intravenous and is likely related to recent IV access. 4. Scattered dental caries. Electronically Signed   By: Genevie Ann M.D.   On: 03/05/2021 05:17    ____________________________________________   PROCEDURES  Procedure(s) performed (including Critical Care):  Procedures  CRITICAL CARE Performed by: Cyril Mourning Rushie Brazel   Total critical care time: 75 minutes  Critical care time was exclusive of separately billable procedures and treating other patients.  Critical care was necessary to treat or prevent imminent or life-threatening deterioration.  Critical care was time spent personally by me on the following activities: development of treatment plan with patient and/or  surrogate as well as nursing, discussions with consultants, evaluation of patient's response to treatment, examination of patient, obtaining history from patient or surrogate, ordering and performing treatments and interventions, ordering and review of laboratory studies, ordering and review of radiographic studies, pulse oximetry and re-evaluation of patient's condition.  ____________________________________________   INITIAL IMPRESSION / ASSESSMENT AND PLAN / ED COURSE  As part of my medical decision making, I reviewed the following data within the [place] History obtained from family, Nursing notes reviewed and incorporated, Labs reviewed , EKG interpreted , Old EKG reviewed, Old chart reviewed, Radiograph reviewed , Discussed with admitting physician , CTs reviewed, and Notes from prior ED visits         Patient here after an unwitnessed fall from his  nursing facility.  Has obvious facial trauma.  Is on aspirin but no anticoagulant.  Will obtain CT of the head, face and cervical spine.  Also appears to have a large joint effusion to the left knee and is complaining of pain in this area.  Will obtain x-ray.  Patient also very cool to touch and found to have a rectal temperature of 94.8.  Concern for possible sepsis.  We will start septic work-up and give IV fluids, broad-spectrum antibiotics.  ED PROGRESS  Patient's lactic is come back greater than 8.  He has had some hypotension and tachycardia here but has been fluid responsive.  I did have a long conversation with patient's daughter by phone and she states at this time she would want everything done for the patient including keeping him a full code despite the fact that he sounds like he has a very poor quality of life at baseline given his severe dementia.  We discussed that his vital signs and lab work are suggestive of severe illness that is life-threatening to him.  She verbalized understanding.  Patient's COVID-19  test has come back positive.  Daughter states however that this was positive about 3 weeks ago.  This is likely just a residual positive test.  6:00 AM  Pt's vital signs have remained stable.  His temperature is slowly improving.  He has no complaints at this time.  His procalcitonin level has come back normal making septic shock less likely.  I suspect that this could be cardiogenic in nature.  Bedside ultrasound reveals very poor cardiac squeeze.  His BNP is greater than 4500 and his CT imaging is suggestive of COVID-pneumonia versus pulmonary edema.  He has other signs of heart failure on CT imaging including progressed cardiomegaly, reflux of contrast into the liver, delayed systemic arterial enhancement, anasarca.  Will give IV Lasix.  He is not on any pressors at this time.  His troponins are mildly elevated but stable.  His repeat EKG shows no new changes.  CT of his head, face and cervical spine show that he has posttraumatic nasal septal deviation but no nasal fractures.  Also appears to possibly have a mild L1 compression fracture without retropulsion.  Given patient is critically ill with lactic still elevated on repeat at greater than 7 and patient is a full code per his daughter, discussed with Rufina Falco NP with critical care who agrees to admit patient to the ICU.   I reviewed all nursing notes and pertinent previous records as available.  I have reviewed and interpreted any EKGs, lab and urine results, imaging (as available).  ____________________________________________   FINAL CLINICAL IMPRESSION(S) / ED DIAGNOSES  Final diagnoses:  Hypothermia  Fall  Lactic acidosis  Congestive heart failure, unspecified HF chronicity, unspecified heart failure type ([place])  COVID-19     ED Discharge Orders     None       *Please note:  Aalim Stoffers was evaluated in Emergency Department on 03/14/2021 for the symptoms described in the history of present illness. He was  evaluated in the context of the global COVID-19 pandemic, which necessitated consideration that the patient might be at risk for infection with the SARS-CoV-2 virus that causes COVID-19. Institutional protocols and algorithms that pertain to the evaluation of patients at risk for COVID-19 are in a state of rapid change based on information released by regulatory bodies including the CDC and federal and state organizations. These policies and algorithms were followed during the patient's  care in the ED.  Some ED evaluations and interventions may be delayed as a result of limited staffing during and the pandemic.*   Note:  This document was prepared using Dragon voice recognition software and may include unintentional dictation errors.    Dalexa Gentz, Delice Bison, DO 03/09/2021 [id number]

## 2021-02-25 DIAGNOSIS — E872 Acidosis: Secondary | ICD-10-CM

## 2021-02-25 DIAGNOSIS — U071 COVID-19: Secondary | ICD-10-CM | POA: Diagnosis not present

## 2021-02-25 DIAGNOSIS — Z7189 Other specified counseling: Secondary | ICD-10-CM

## 2021-02-25 DIAGNOSIS — I509 Heart failure, unspecified: Secondary | ICD-10-CM | POA: Diagnosis not present

## 2021-02-25 DIAGNOSIS — R600 Localized edema: Secondary | ICD-10-CM

## 2021-02-25 DIAGNOSIS — Z0000 Encounter for general adult medical examination without abnormal findings: Secondary | ICD-10-CM

## 2021-02-25 LAB — CBC WITH DIFFERENTIAL/PLATELET
Abs Immature Granulocytes: 0.04 10*3/uL (ref 0.00–0.07)
Basophils Absolute: 0 10*3/uL (ref 0.0–0.1)
Basophils Relative: 0 %
Eosinophils Absolute: 0 10*3/uL (ref 0.0–0.5)
Eosinophils Relative: 0 %
HCT: 37.9 % — ABNORMAL LOW (ref 39.0–52.0)
Hemoglobin: 12.6 g/dL — ABNORMAL LOW (ref 13.0–17.0)
Immature Granulocytes: 1 %
Lymphocytes Relative: 7 %
Lymphs Abs: 0.5 10*3/uL — ABNORMAL LOW (ref 0.7–4.0)
MCH: 34.9 pg — ABNORMAL HIGH (ref 26.0–34.0)
MCHC: 33.2 g/dL (ref 30.0–36.0)
MCV: 105 fL — ABNORMAL HIGH (ref 80.0–100.0)
Monocytes Absolute: 0.2 10*3/uL (ref 0.1–1.0)
Monocytes Relative: 4 %
Neutro Abs: 5.8 10*3/uL (ref 1.7–7.7)
Neutrophils Relative %: 88 %
Platelets: 171 10*3/uL (ref 150–400)
RBC: 3.61 MIL/uL — ABNORMAL LOW (ref 4.22–5.81)
RDW: 20.1 % — ABNORMAL HIGH (ref 11.5–15.5)
Smear Review: NORMAL
WBC: 6.5 10*3/uL (ref 4.0–10.5)
nRBC: 5.2 % — ABNORMAL HIGH (ref 0.0–0.2)

## 2021-02-25 LAB — COMPREHENSIVE METABOLIC PANEL
ALT: 81 U/L — ABNORMAL HIGH (ref 0–44)
AST: 108 U/L — ABNORMAL HIGH (ref 15–41)
Albumin: 2.1 g/dL — ABNORMAL LOW (ref 3.5–5.0)
Alkaline Phosphatase: 77 U/L (ref 38–126)
Anion gap: 15 (ref 5–15)
BUN: 31 mg/dL — ABNORMAL HIGH (ref 8–23)
CO2: 14 mmol/L — ABNORMAL LOW (ref 22–32)
Calcium: 8.9 mg/dL (ref 8.9–10.3)
Chloride: 106 mmol/L (ref 98–111)
Creatinine, Ser: 1.18 mg/dL (ref 0.61–1.24)
GFR, Estimated: >60 mL/min (ref >60)
Glucose, Bld: 109 mg/dL — ABNORMAL HIGH (ref 70–99)
Potassium: 4.1 mmol/L (ref 3.5–5.1)
Sodium: 135 mmol/L (ref 135–145)
Total Bilirubin: 1.7 mg/dL — ABNORMAL HIGH (ref 0.3–1.2)
Total Protein: 6.9 g/dL (ref 6.5–8.1)

## 2021-02-25 LAB — HEPARIN LEVEL (UNFRACTIONATED): Heparin Unfractionated: 0.6 IU/mL (ref 0.30–0.70)

## 2021-02-25 LAB — PHOSPHORUS: Phosphorus: 2 mg/dL — ABNORMAL LOW (ref 2.5–4.6)

## 2021-02-25 LAB — LEGIONELLA PNEUMOPHILA SEROGP 1 UR AG: L. pneumophila Serogp 1 Ur Ag: NEGATIVE

## 2021-02-25 LAB — C-REACTIVE PROTEIN: CRP: 3.6 mg/dL — ABNORMAL HIGH (ref <1.0)

## 2021-02-25 LAB — URINE CULTURE: Culture: NO GROWTH

## 2021-02-25 LAB — D-DIMER, QUANTITATIVE: D-Dimer, Quant: 3.9 ug/mL-FEU — ABNORMAL HIGH (ref 0.00–0.50)

## 2021-02-25 LAB — MAGNESIUM: Magnesium: 2.1 mg/dL (ref 1.7–2.4)

## 2021-02-25 LAB — GLUCOSE, CAPILLARY
Glucose-Capillary: 107 mg/dL — ABNORMAL HIGH (ref 70–99)
Glucose-Capillary: 119 mg/dL — ABNORMAL HIGH (ref 70–99)
Glucose-Capillary: 129 mg/dL — ABNORMAL HIGH (ref 70–99)

## 2021-02-25 LAB — FERRITIN: Ferritin: 128 ng/mL (ref 24–336)

## 2021-02-25 MED ORDER — SODIUM CHLORIDE 0.9 % IV SOLN
25.0000 ug/min | INTRAVENOUS | Status: DC
  Filled 2021-02-25 (×2): qty 2

## 2021-02-25 MED ORDER — PHENYLEPHRINE HCL-NACL 20-0.9 MG/250ML-% IV SOLN
25.0000 ug/min | INTRAVENOUS | Status: DC
  Administered 2021-02-25: 80 ug/min via INTRAVENOUS
  Administered 2021-02-25: 25 ug/min via INTRAVENOUS
  Administered 2021-02-26: 70 ug/min via INTRAVENOUS
  Administered 2021-02-26: 40 ug/min via INTRAVENOUS
  Administered 2021-02-26: 50 ug/min via INTRAVENOUS
  Filled 2021-02-25 (×6): qty 250

## 2021-02-25 MED ORDER — DEXMEDETOMIDINE HCL IN NACL 400 MCG/100ML IV SOLN
0.4000 ug/kg/h | INTRAVENOUS | Status: DC
  Administered 2021-02-25: 0.4 ug/kg/h via INTRAVENOUS
  Filled 2021-02-25: qty 100

## 2021-02-25 MED ORDER — NOREPINEPHRINE 4 MG/250ML-% IV SOLN
2.0000 ug/min | INTRAVENOUS | Status: DC
  Filled 2021-02-25: qty 250

## 2021-02-25 MED ORDER — PHENYLEPHRINE CONCENTRATED 100MG/250ML (0.4 MG/ML) INFUSION SIMPLE: 0.0000 ug/min | INTRAVENOUS | Status: DC

## 2021-02-25 MED ORDER — SODIUM CHLORIDE 0.9 % IV SOLN: 250.0000 mL | INTRAVENOUS | Status: DC

## 2021-02-25 MED ORDER — DEXAMETHASONE SODIUM PHOSPHATE 4 MG/ML IJ SOLN
4.0000 mg | INTRAMUSCULAR | Status: DC
  Administered 2021-02-26 – 2021-02-27 (×2): 4 mg via INTRAVENOUS
  Filled 2021-02-25 (×2): qty 1

## 2021-02-25 NOTE — Progress Notes (Addendum)
PHARMACY CONSULT NOTE - FOLLOW UP  Pharmacy Consult for Electrolyte Monitoring and Replacement   Recent Labs: Potassium (mmol/L)  Date Value  02/25/2021 4.1   Magnesium (mg/dL)  Date Value  02/25/2021 2.1   Calcium (mg/dL)  Date Value  02/25/2021 8.9   Albumin (g/dL)  Date Value  02/25/2021 2.1 (L)   Phosphorus (mg/dL)  Date Value  02/25/2021 2.0 (L)   Sodium (mmol/L)  Date Value  02/25/2021 135     Assessment: 80 year old male admitted after a fall at facility. History of atrial flutter, no anticoagulation at baseline. Patient found to be Covid positive and started on remdesivir and steroids. Also found to have DVT bilateral lower extremities. Pharmacy consult for electrolyte management.  Goal of Therapy:  Electrolytes WNL  Plan:  --Phos slightly low. Patient currently refusing all meds. Will defer replacement today. If continues to trend down, will attempt IV supplement. --Recheck all electrolytes with morning labs  Tawnya Crook, PharmD, BCPS Clinical Pharmacist 02/25/2021 1:07 PM

## 2021-02-25 NOTE — Consult Note (Addendum)
Consultation Note Date: 02/25/2021   Patient Name: Craig [name]  DOB: March 16, 1941  MRN: [id number]  Age / Sex: 80 y.o., male  PCP: Townsend Roger, MD Referring Physician: Ottie Glazier, MD  Reason for Consultation: Establishing goals of care  HPI/Patient Profile: 80 y.o with significant PMH as below who presented to the ED from [facility] with unwitnessed fall.  Clinical Assessment and Goals of Care: Unable to reach daughter. 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 He discusses his decline since November 2021. He states since July, he has "driven off a cliff" as far as decline in his status. He discusses his current status in the nursing home and frustrations with the facility and process.    He requests that we be direct and blunt, and not sugar coat information. We discussed his diagnoses, prognosis, GOC, EOL wishes disposition and options.  Created space and opportunity for patient  to explore thoughts and feelings regarding current medical information.    A detailed discussion was had today regarding advanced directives.  Concepts specific to code status, artifical feeding and hydration, IV antibiotics and rehospitalization were discussed.  The difference between an aggressive medical intervention path and a comfort care path was discussed. Discussed the end of life process. Values and goals of care important to patient and family were attempted to be elicited.  Discussed limitations of medical interventions to prolong quality of life in some situations and discussed the concept of human mortality. Son and I  discussed acceptable QOL for Mr. Cudney in great depth. His QOL is not acceptable to him as he would not want to have to be cared for and would not want to live in a facility.   He states his father has been suffering. He  states he has been getting the feeling his father is ready to go, ready to die, and tells me he and his father are of faith. He states his sister wants to keep continuing life prolonging care, with the exception of a PEG. He is aware his father is not eating and is refusing medications. He states last time he went to visit with his father he tried to feed him, and he refused to eat.   Discussed a group conversation, and he advises his sister will not be available until after 5:00. He will talk to his sister over the weekend about care moving forward including code status, as he does not want to see his father suffering.  All questions answered.   Discussed having a family meeting Monday.     SUMMARY OF RECOMMENDATIONS   Son wants to focus on comfort. He and his sister are equal decision makers and he advises sister wants to continue life prolonging care. He states he will talk with her over the weekend.   Of note, he tells me he and his sister agree to not place a long term feeding tube. He is aware patient is refusing oral intake and medications,  and tells me his father refused to eat the last time he went to visit him.   I will follow up Monday.    Prognosis:  < 6 months       Primary Diagnoses: Present on Admission:  Sepsis due to pneumonia [facility])   I have reviewed the medical record, interviewed the patient and family, and examined the patient. The following aspects are pertinent.  Past Medical History:  Diagnosis Date   Atrial flutter ([place])    Coronary artery disease    Hyperlipidemia    Hypertension    TIA (transient ischemic attack)    Social History   Socioeconomic History   Marital status: Divorced    Spouse name: Not on file   Number of children: Not on file   Years of education: Not on file   Highest education level: Not on file  Occupational History   Not on file  Tobacco Use   Smoking status: Former    Packs/day: 0.00    Types: Cigarettes   Smokeless  tobacco: Current    Types: Snuff  Substance and Sexual Activity   Alcohol use: Yes   Drug use: No   Sexual activity: Not on file  Other Topics Concern   Not on file  Social History Narrative   Not on file   Social Determinants of Health   Financial Resource Strain: Not on file  Food Insecurity: Not on file  Transportation Needs: Not on file  Physical Activity: Not on file  Stress: Not on file  Social Connections: Not on file   Family History  Family history unknown: Yes   Scheduled Meds:  Chlorhexidine Gluconate Cloth  6 each Topical Q0600   [START ON 02/26/2021] dexamethasone (DECADRON) injection  4 mg Intravenous Q24H   feeding supplement  237 mL Oral TID BM   folic acid  1 mg Intravenous Daily   insulin aspart  0-5 Units Subcutaneous QHS   insulin aspart  0-9 Units Subcutaneous TID WC   multivitamin with minerals  1 tablet Oral Daily   thiamine injection  100 mg Intravenous Daily   Continuous Infusions:  heparin 1,200 Units/hr (02/25/21 0108)   remdesivir 100 mg in NS 100 mL     PRN Meds:.acetaminophen, chlorpheniramine-HYDROcodone, docusate sodium, guaiFENesin-dextromethorphan, polyethylene glycol Medications Prior to Admission:  Prior to Admission medications   Medication Sig Start Date End Date Taking? Authorizing Provider  ascorbic acid (VITAMIN C) 500 MG tablet Take 500 mg by mouth 2 (two) times daily.   Yes [provider]  aspirin EC 81 MG tablet Take 81 mg by mouth daily. Swallow whole.   Yes [provider]  busPIRone (BUSPAR) 5 MG tablet Take 5 mg by mouth 3 (three) times daily.   Yes [provider]  furosemide (LASIX) 20 MG tablet Take 3 tablets (60 mg total) by mouth daily. 01/07/21  Yes Alma Friendly, MD  JARDIANCE 10 MG TABS tablet Take 10 mg by mouth daily. 02/16/21  Yes [provider]  losartan (COZAAR) 25 MG tablet Take 1 tablet (25 mg total) by mouth daily. 06/15/20  Yes Lorella Nimrod, MD  melatonin 3 MG  TABS tablet Take 3 mg by mouth at bedtime.   Yes [provider]  metoprolol succinate (TOPROL-XL) 50 MG 24 hr tablet Take 50 mg by mouth daily. Take with or immediately following a meal.   Yes [provider]  Multiple Vitamin (MULTIVITAMIN WITH MINERALS) TABS tablet Take 1 tablet by mouth daily. 04/27/20  Yes Lorella Nimrod, MD  spironolactone (ALDACTONE) 25 MG tablet Take 1 tablet (25 mg total) by mouth daily. 01/06/21 03/05/2021 Yes Alma Friendly, MD  acetaminophen (TYLENOL) 650 MG CR tablet Take 650 mg by mouth every 8 (eight) hours as needed for pain.    [provider]  donepezil (ARICEPT) 10 MG tablet Take 10 mg by mouth at bedtime. 12/18/17   [provider]  feeding supplement (ENSURE ENLIVE / ENSURE PLUS) LIQD Take 237 mLs by mouth 3 (three) times daily between meals. 04/26/20   Lorella Nimrod, MD  OVER THE COUNTER MEDICATION 1 application. Apply compression stockings in the morning and remove at bedtime    [provider]  pantoprazole (PROTONIX) 40 MG tablet Take 1 tablet (40 mg total) by mouth daily. 06/15/20   Lorella Nimrod, MD  simethicone (MYLICON) 80 MG chewable tablet Chew 80 mg by mouth every 6 (six) hours as needed for flatulence.    [provider]   Allergies  Allergen Reactions   Penicillins    Review of Systems  Unable to perform ROS  Physical Exam Constitutional:      Comments: Resting with eyes closed.  Pulmonary:     Effort: Pulmonary effort is normal.    Vital Signs: BP (!) 93/58   Pulse (!) 56   Temp 98.1 F (36.7 C)   Resp 10   Ht '6\' 3"'$  (1.905 m)   Wt 74.6 kg   SpO2 100%   BMI 20.56 kg/m  Pain Scale: PAINAD POSS *See Group Information*: S-Acceptable,Sleep, easy to arouse Pain Score: 0-No pain   SpO2: SpO2: 100 % O2 Device:SpO2: 100 % O2 Flow Rate: .   IO: Intake/output summary:  Intake/Output Summary (Last 24 hours) at 02/25/2021 1223 Last data filed at 02/25/2021 0600 Gross per 24 hour   Intake 1604.99 ml  Output 1125 ml  Net 479.99 ml    LBM: Last BM Date:  (PTA) Baseline Weight: Weight: 79.6 kg Most recent weight: Weight: 74.6 kg        Time In: 12:00 Time Out: 1:30 Time Total: 90 min Greater than 50%  of this time was spent counseling and coordinating care related to the above assessment and plan.  Signed by: Asencion Gowda, NP   Please contact Palliative Medicine Team phone at [phone] for questions and concerns.  For individual provider: See Shea Evans

## 2021-02-25 NOTE — Progress Notes (Signed)
ANTICOAGULATION CONSULT NOTE  Pharmacy Consult for heparin Indication: DVT  Allergies  Allergen Reactions   Penicillins     Patient Measurements: Height: '6\' 3"'$  (190.5 cm) Weight: 74.6 kg (164 lb 7.4 oz) IBW/kg (Calculated) : 84.5 Heparin Dosing Weight: 76 kg  Vital Signs: Temp: 97.9 F (36.6 C) (09/09 0600) BP: 92/71 (09/09 0600) Pulse Rate: 73 (09/09 0600)  Labs: Recent Labs    03/09/2021 0144 03/10/2021 0250 02/18/2021 0455 03/15/2021 2023 02/25/21 0630  HGB 12.7*  --   --   --  12.6*  HCT 40.0  --   --   --  37.9*  PLT 188  --   --   --  171  APTT  --  36  --   --   --   LABPROT  --  25.2*  --   --   --   INR  --  2.3*  --   --   --   HEPARINUNFRC  --   --   --  0.48 0.60  CREATININE  --  1.27*  --   --   --   CKTOTAL  --  62  --   --   --   TROPONINIHS 51*  --  48*  --   --      Estimated Creatinine Clearance: 50.6 mL/min (A) (by C-G formula based on SCr of 1.27 mg/dL (H)).   Medical History: Past Medical History:  Diagnosis Date   Atrial flutter ([place])    Coronary artery disease    Hyperlipidemia    Hypertension    TIA (transient ischemic attack)      Assessment: 80 year old male admitted after a fall at facility. History of atrial flutter, no anticoagulation at baseline. Patient found to be Covid positive and started on remdesivir and steroids. Ultrasound lower extremities positive symmetric bilateral acute, nonocclusive DVT involving the bilateral profunda femoral and bilateral posterior tibial and peroneal veins.  INR elevated at 2.3 on admission.  9/8 2023 HL 0.48 9/9 0630 HL 0.60    Goal of Therapy:  Heparin level 0.3-0.7 units/ml Monitor platelets by anticoagulation protocol: Yes   Plan:  --Heparin level therapeutic --Continue heparin drip at 1200 units/hr --Check HL daily --CBC daily while on heparin  Tawnya Crook, PharmD, BCPS Clinical Pharmacist 02/25/2021 8:01 AM

## 2021-02-25 NOTE — Progress Notes (Signed)
NAME:  Craig [name], MRN:  [id number], DOB:  [birth year], LOS: 1 ADMISSION DATE:  03/17/2021, CONSULTATION DATE: 03/14/2021 REFERRING MD: Michel Harrow, DO, CHIEF COMPLAINT: Unwitnessed fall   HPI  80 y.o with significant PMH as below who presented to the ED from [place] with unwitnessed fall.  Patient has history of dementia and poor historian.  History obtained from patient's chart.  Per EMS run sheet and ED notes, patient was found down after sustaining unwitnessed fall with injuries noted in the nasal bridge, and right nare, swelling to bilateral lower extremities and left knee.  Per staff at the facility, patient had been complaining of left knee pain prior to fall.  He was also noted with bleeding from his nose which has since subsided.  He is confused at baseline due to history of dementia.  ED Course: On arrival to the ED, he was hypothermic temp of(!) 94.8 F (34.9 C) with blood pressure 92/86 mm Hg and pulse rate (!) 110 beats/min, respiration 14 and oxygen saturation 98% on room air. There were no focal neurological deficits; he was alert and oriented to self only.  Initial pertinent labs/Diagnostics WBC/Hgb/Hct/Plts:  6.5/12.6/37.9/171 (09/09 0630)  CO2 17, glucose 123, BUN/creatinine 30/1.27, calcium 8.6, AST/ALT 67/52 total bilirubin 2.3, anion gap 17 UA: Pyuria there was no evidence of UTI.  Urine culture pending Lactate: 8.6>>7.4 Troponin: 51 PT/INR: 25.2/2.3 BNP > 4500 SARS Coronavirus 2 by RT PCR POSITIVE Abnormal  EKG: normal EKG, normal sinus rhythm, unchanged from previous tracings, ST depression in lateral leads. Imaging: CT head negative for acute intracranial abnormality however CT maxillofacial and cervical spine showed posttraumatic nasal septal deviation but no nasal fracture as well as mild L1 compression fracture without retropulsion.  Given initial concerns for sepsis, septic work-up was initiated and patient received IV fluids and  started on broad broad spectrum antibiotics.  Pro-Cal came back normal however BNP was markedly elevated concerning for cardiogenic in nature.  Bedside ultrasound was performed by EDP which revealed very poor cardiac squeeze.  Patient received IV Lasix.  PCCM consulted for admission to ICU for further work-up and management. Past Medical History  Acute on chronic HFrEF Atrial fibrillation SBO secondary to incarcerated right inguinal hernia and right spermatic cord abscess s/p right inguinal hernia repair, small bowel resection, and right orchiectomy-05/27/20 Dementia with behavioral disturbances Bradycardia CAD Hyperlipidemia Hypertension TIA  Significant Hospital Events   9/8: Admitted to ICU with sepsis secondary to COVID-pneumonia and traumatic fall  Consults:  PCCM  Procedures:  None  Significant Diagnostic Tests:  9/8: Chest Xray>mild interstitial edema 9/8: Noncontrast CT head> no acute intracranial abnormality 9/8: CTA Chest, abdomen and pelvis>Patchy peribronchial and peripheral asymmetric ground-glass opacity in both lungs has a typical appearance of COVID-19 pneumonia.  Micro Data:  9/8: SARS-CoV-2 PCR> positive 9/8: Influenza PCR> negative 9/8: Blood culture x2> 9/8: Urine Culture> 9/80: MRSA PCR>>  9/8: Strep pneumo urinary antigen> 9/8: Legionella urinary antigen> 9/8: Mycoplasma pneumonia>  Antimicrobials:  Vancomycin 9/8>> Cefepime 9/8>>  OBJECTIVE  Blood pressure 119/77, pulse (!) 56, temperature 98.1 F (36.7 C), resp. rate (!) 28, height '6\' 3"'$  (1.905 m), weight 74.6 kg, SpO2 100 %.        Intake/Output Summary (Last 24 hours) at 02/25/2021 1020 Last data filed at 02/25/2021 0600 Gross per 24 hour  Intake 1727.49 ml  Output 1375 ml  Net 352.49 ml    Filed Weights   03/01/2021 0251 02/21/2021 0615 02/25/21 0500  Weight: 79.6 kg  76.6 kg 74.6 kg    Physical Examination  GENERAL: 80 year-old critically ill patient lying in the bed with no acute  distress.  EYES: Pupils equal, round, reactive to light and accommodation. No scleral icterus. Extraocular muscles intact.  HEENT: Head atraumatic, normocephalic. Oropharynx and nasopharynx with dry blood and bruising NECK:  Supple, no jugular venous distention. No thyroid enlargement, no tenderness.  LUNGS: Normal breath sounds bilaterally, no wheezing, rales,rhonchi or crepitation. No use of accessory muscles of respiration.  CARDIOVASCULAR: S1, S2 normal. No murmurs, rubs, or gallops.  ABDOMEN: Soft, nontender, nondistended. Bowel sounds present. No organomegaly or mass.  EXTREMITIES: 3+ pitting edema, bilateral knee swelling L>R. No cyanosis, or clubbing.  NEUROLOGIC: Cranial nerves II through XII are intact.  Muscle strength 4/5 in BUE extremities. Sensation intact. Gait not checked.  PSYCHIATRIC: The patient is alert and oriented x 1  SKIN: No obvious rash, lesion, or ulcer.   Labs/imaging that I havepersonally reviewed  (right click and "Reselect all SmartList Selections" daily)     Labs   CBC: Recent Labs  Lab 03/18/2021 0144 02/25/21 0630  WBC 5.9 6.5  NEUTROABS 5.0 5.8  HGB 12.7* 12.6*  HCT 40.0 37.9*  MCV 106.7* 105.0*  PLT 188 171     Basic Metabolic Panel: Recent Labs  Lab 03/16/2021 0250 02/25/21 0630  NA 136 135  K 3.5 4.1  CL 102 106  CO2 17* 14*  GLUCOSE 123* 109*  BUN 30* 31*  CREATININE 1.27* 1.18  CALCIUM 8.6* 8.9  MG  --  2.1  PHOS  --  2.0*    GFR: Estimated Creatinine Clearance: 54.4 mL/min (by C-G formula based on SCr of 1.18 mg/dL). Recent Labs  Lab 02/17/2021 0144 03/11/2021 0250 02/28/2021 0455 03/07/2021 0836 03/10/2021 1106 02/25/21 0630  PROCALCITON  --  <0.10  --   --   --   --   WBC 5.9  --   --   --   --  6.5  LATICACIDVEN 8.6*  --  7.4* 7.2* 6.6*  --      Liver Function Tests: Recent Labs  Lab 02/23/2021 0250 02/25/21 0630  AST 67* 108*  ALT 52* 81*  ALKPHOS 74 77  BILITOT 2.3* 1.7*  PROT 7.3 6.9  ALBUMIN 2.3* 2.1*     Recent Labs  Lab 03/06/2021 0250  LIPASE 30    No results for input(s): AMMONIA in the last 168 hours.  ABG    Component Value Date/Time   HCO3 31.4 (H) 05/26/2020 2024   O2SAT 65.9 05/26/2020 2024      Coagulation Profile: Recent Labs  Lab 02/17/2021 0250  INR 2.3*     Cardiac Enzymes: Recent Labs  Lab 03/12/2021 0250  CKTOTAL 62     HbA1C: No results found for: HGBA1C  CBG: Recent Labs  Lab 03/14/2021 0751 03/08/2021 1106 03/12/2021 1654 03/18/2021 2215 02/25/21 0723  GLUCAP 96 87 167* 112* 107*    Review of Systems:   Unable to obtain due to altered mental status  Past Medical History  He,  has a past medical history of Atrial flutter ([place]), Coronary artery disease, Hyperlipidemia, Hypertension, and TIA (transient ischemic attack).   Surgical History    Past Surgical History:  Procedure Laterality Date   arm surgery Left    BOWEL RESECTION  05/27/2020   Procedure: SMALL BOWEL RESECTION;  Surgeon: Ronny Bacon, MD;  Location: [facility] ORS;  Service: General;;   [place]  Right 05/27/2020   Procedure: HERNIA REPAIR INGUINAL INCARCERATED;  Surgeon: Ronny Bacon, MD;  Location: [facility] ORS;  Service: General;  Laterality: Right;   ORCHIECTOMY Right 05/27/2020   Procedure: ORCHIECTOMY;  Surgeon: Ronny Bacon, MD;  Location: [facility] ORS;  Service: General;  Laterality: Right;     Social History   reports that he has quit smoking. His smokeless tobacco use includes snuff. He reports current alcohol use. He reports that he does not use drugs.   Family History   His Family history is unknown by patient.   Allergies Allergies  Allergen Reactions   Penicillins      Home Medications  Prior to Admission medications   Medication Sig Start Date End Date Taking? Authorizing Provider  acetaminophen (TYLENOL) 650 MG CR tablet Take 650 mg by mouth every 8 (eight) hours as needed for pain.    [provider]  aspirin EC  81 MG tablet Take 81 mg by mouth daily. Swallow whole.    [provider]  busPIRone (BUSPAR) 5 MG tablet Take 5 mg by mouth 3 (three) times daily.    [provider]  donepezil (ARICEPT) 10 MG tablet Take 10 mg by mouth at bedtime. 12/18/17   [provider]  feeding supplement (ENSURE ENLIVE / ENSURE PLUS) LIQD Take 237 mLs by mouth 3 (three) times daily between meals. 04/26/20   Lorella Nimrod, MD  furosemide (LASIX) 20 MG tablet Take 3 tablets (60 mg total) by mouth daily. 01/07/21   Alma Friendly, MD  losartan (COZAAR) 25 MG tablet Take 1 tablet (25 mg total) by mouth daily. 06/15/20   Lorella Nimrod, MD  metoprolol succinate (TOPROL-XL) 50 MG 24 hr tablet Take 50 mg by mouth daily. Take with or immediately following a meal.    [provider]  Multiple Vitamin (MULTIVITAMIN WITH MINERALS) TABS tablet Take 1 tablet by mouth daily. 04/27/20   Lorella Nimrod, MD  OVER THE COUNTER MEDICATION 1 application. Apply compression stockings in the morning and remove at bedtime    [provider]  pantoprazole (PROTONIX) 40 MG tablet Take 1 tablet (40 mg total) by mouth daily. 06/15/20   Lorella Nimrod, MD  simethicone (MYLICON) 80 MG chewable tablet Chew 80 mg by mouth every 6 (six) hours as needed for flatulence.    [provider]  spironolactone (ALDACTONE) 25 MG tablet Take 1 tablet (25 mg total) by mouth daily. 01/06/21 02/05/21  Alma Friendly, MD  Scheduled Meds:  Chlorhexidine Gluconate Cloth  6 each Topical Q0600   [START ON 02/26/2021] dexamethasone (DECADRON) injection  4 mg Intravenous Q24H   feeding supplement  237 mL Oral TID BM   folic acid  1 mg Intravenous Daily   insulin aspart  0-5 Units Subcutaneous QHS   insulin aspart  0-9 Units Subcutaneous TID WC   multivitamin with minerals  1 tablet Oral Daily   thiamine injection  100 mg Intravenous Daily   Continuous Infusions:  heparin 1,200 Units/hr (02/25/21 0108)   remdesivir  100 mg in NS 100 mL     PRN Meds:.acetaminophen, chlorpheniramine-HYDROcodone, docusate sodium, guaiFENesin-dextromethorphan, polyethylene glycol  Active Hospital Problem list   Sepsis due to COVID-pneumonia AKI Anion gap metabolic acidosis Elevated LFTs Elevated troponin Acute on chronic congestive heart failure  Assessment & Plan:  Sepsis without septic shock due to suspected COVID-pneumonia CT chest shows Patchy peribronchial and peripheral asymmetric ground-glass opacity in both lungs has a typical appearance of COVID-19 pneumonia. -Supplemental O2 as needed  to maintain O2 saturations 88 to 92% -Follow intermittent ABG and chest x-ray as needed -As needed bronchodilators -Stat remdesivir x5 days  -Continue Empiric abx coverage given risk factors (nursing home resident) with vancomycin + cefepime pending cultures, may broaden if appropriate -f/u cultures, trend lactic/ PCT -Check strep pneumo and Legionella -Steroids initiated -Encourage OOB, IS, FV, and awake proning if able -Continue airborne, contact precautions for 21 days from positive testing. -Monitor CMP and inflammatory markers -Consider vasopressors to keep MAP greater than 65   -patietn appears to be more aggitated and showing signs of drug/alcohol withdrawal   Acute on chronic Systolic CHF (last known EF <20%) BNP>4500 Cardiogenic shock -Hypertension -Elevated troponins likely demand ischemia Hx: CAD status post stents, A-fib, HLD  -Trend troponins -Continuous cardiac monitoring -Maintain MAP greater than 65 -Currently not on any anticoagulation for A. fib, hold antiplatelets and anticoagulation in the setting of elevated PT/INR and nosebleed -IV Lasix as blood pressure and renal function permits; currently on Lasix 20 mg  -Hold losartan, spironolactone and metoprolol in the setting of sepsis and hypotension -Repeat 2D Echocardiogram  Acute kidney injury Likely prerenal in the setting of above -Monitor  I&O's / urinary output -Follow BMP -Ensure adequate renal perfusion -Avoid nephrotoxic agents as able -Replace electrolytes as indicated  Anion gap metabolic acidosis Lactic acidosis -Trend lactic acid -Bicarb as needed   Elevated LFTs likely in the setting of above -CT abdomen pelvis cholelithiasis no evidence of cholecystitis -Check hepatitis panel if appropriate -Lipid profile, CK and TSH -Will continue to follow   Acute Metabolic Encephalopathy due to above Underlying history of dementia -CT head negative for acute intracranial abnormality -Provide supportive care -Avoid sedative as able -Hold donepezil due to bradycardia    Best practice:  Diet:  NPO Pain/Anxiety/Delirium protocol (if indicated): No VAP protocol (if indicated): Not indicated DVT prophylaxis: Contraindicated GI prophylaxis: PPI Glucose control:  SSI Yes Central venous access:  N/A Arterial line:  N/A Foley:  Yes, and it is still needed Mobility:  bed rest  PT consulted: N/A Last date of multidisciplinary goals of care discussion [9/8] Code Status:  full code Disposition: ICU   = Goals of Care = Code Status Order: FULL  Primary Emergency Contact: Perea,Jamie Wishes to pursue full aggressive treatment and intervention options, including CPR and intubation, but goals of care will be addressed on going with family if that should become necessary.   Critical care provider statement:   Total critical care time: 33 minutes   Performed by: Lanney Gins MD   Critical care time was exclusive of separately billable procedures and treating other patients.   Critical care was necessary to treat or prevent imminent or life-threatening deterioration.   Critical care was time spent personally by me on the following activities: development of treatment plan with patient and/or surrogate as well as nursing, discussions with consultants, evaluation of patient's response to treatment, examination of patient,  obtaining history from patient or surrogate, ordering and performing treatments and interventions, ordering and review of laboratory studies, ordering and review of radiographic studies, pulse oximetry and re-evaluation of patient's condition.    Ottie Glazier, M.D.  Pulmonary & Critical Care Medicine

## 2021-02-25 NOTE — Consult Note (Signed)
[facility] Consult Note  MRN : [id number]  Craig [name] is a 80 y.o. (August 18, 1940) male who presents with chief complaint of  Chief Complaint  Patient presents with   Fall  .  History of Present Illness: I am asked to see the patient by Dr. Lanney Gins for DVT and possible PE.  The patient is admitted the hospital has been here for COVID-19 infection.  He is become agitated and is now on a Precedex drip.  He is able to provide no history to me today.  Bilateral lower extremity DVT are seen on duplex today.  These are moderate in severity up to the profunda femoris vein.  There is also question of a pulmonary embolus but his CT scan from earlier today shows possible very small volume pulmonary embolus in the right lower lobe but no large volume pulmonary embolus.  This was not a CT angiogram and so the quality was somewhat limited.  He is on anticoagulation and tolerating so far  Current Facility-Administered Medications  Medication Dose Route Frequency Provider Last Rate Last Admin   0.9 %  sodium chloride infusion  250 mL Intravenous Continuous Graves, Raeford Razor, NP       acetaminophen (TYLENOL) tablet 650 mg  650 mg Oral Q6H PRN Ottie Glazier, MD       Chlorhexidine Gluconate Cloth 2 % PADS 6 each  6 each Topical Q0600 Ottie Glazier, MD   6 each at 02/25/21 1737   chlorpheniramine-HYDROcodone (TUSSIONEX) 10-8 MG/5ML suspension 5 mL  5 mL Oral Q12H PRN Lang Snow, NP       Derrill Memo ON 02/26/2021] dexamethasone (DECADRON) injection 4 mg  4 mg Intravenous Q24H Ottie Glazier, MD       dexmedetomidine (PRECEDEX) 400 MCG/100ML (4 mcg/mL) infusion  0.4-0.8 mcg/kg/hr Intravenous Titrated Milus Banister, NP 7.46 mL/hr at 02/25/21 1336 0.4 mcg/kg/hr at 02/25/21 1336   docusate sodium (COLACE) capsule 100 mg  100 mg Oral BID PRN Lang Snow, NP       feeding supplement (ENSURE ENLIVE / ENSURE PLUS) liquid 237 mL  237 mL Oral TID BM  Ottie Glazier, MD   237 mL at [id number]   folic acid injection 1 mg  1 mg Intravenous Daily Lang Snow, NP   1 mg at 02/25/21 0829   guaiFENesin-dextromethorphan (ROBITUSSIN DM) 100-10 MG/5ML syrup 10 mL  10 mL Oral Q4H PRN Lang Snow, NP       heparin ADULT infusion 100 units/mL (25000 units/233m)  1,200 Units/hr Intravenous Continuous AOttie Glazier MD 12 mL/hr at 02/25/21 0108 1,200 Units/hr at 02/25/21 0108   insulin aspart (novoLOG) injection 0-5 Units  0-5 Units Subcutaneous QHS OLang Snow NP       insulin aspart (novoLOG) injection 0-9 Units  0-9 Units Subcutaneous TID WC OLang Snow NP   1 Units at 02/25/21 1636   multivitamin with minerals tablet 1 tablet  1 tablet Oral Daily OLang Snow NP   1 tablet at 03/05/2021 1109   phenylephrine (NEO-SYNEPHRINE) '20mg'$ /NS 2537mpremix infusion  25-200 mcg/min Intravenous Titrated DoDorothe PeaRPH 18.75 mL/hr at 02/25/21 1604 25 mcg/min at 02/25/21 1604   polyethylene glycol (MIRALAX / GLYCOLAX) packet 17 g  17 g Oral Daily PRN OuLang SnowNP       remdesivir 100 mg in sodium chloride 0.9 % 100 mL IVPB  100 mg Intravenous Daily OuLang SnowNP  thiamine (B-1) injection 100 mg  100 mg Intravenous Daily Lang Snow, NP   100 mg at 02/25/21 0830    Past Medical History:  Diagnosis Date   Atrial flutter ([place])    Coronary artery disease    Hyperlipidemia    Hypertension    TIA (transient ischemic attack)     Past Surgical History:  Procedure Laterality Date   arm surgery Left    BOWEL RESECTION  05/27/2020   Procedure: SMALL BOWEL RESECTION;  Surgeon: Ronny Bacon, MD;  Location: [facility] ORS;  Service: General;;   HERNIA REPAIR     INGUINAL HERNIA REPAIR Right 05/27/2020   Procedure: HERNIA REPAIR INGUINAL INCARCERATED;  Surgeon: Ronny Bacon, MD;  Location: [facility] ORS;  Service: General;  Laterality: Right;   ORCHIECTOMY  Right 05/27/2020   Procedure: ORCHIECTOMY;  Surgeon: Ronny Bacon, MD;  Location: [facility] ORS;  Service: General;  Laterality: Right;     Social History   Tobacco Use   Smoking status: Former    Packs/day: 0.00    Types: Cigarettes   Smokeless tobacco: Current    Types: Snuff  Substance Use Topics   Alcohol use: Yes   Drug use: No     Family History  Family history unknown: Yes    Allergies  Allergen Reactions   Penicillins      REVIEW OF SYSTEMS (Negative unless checked) Patient unable to provide on a Precedex drip and very somnolent.   Physical Examination  Vitals:   02/25/21 1100 02/25/21 1200 02/25/21 1300 02/25/21 1400  BP: (!) 93/58 (!) 115/104 (!) 126/103 (!) 88/74  Pulse:  95 (!) 53 (!) 57  Resp: '10 17 16 '$ (!) 22  Temp: 98.1 F (36.7 C) 97.9 F (36.6 C) 98.1 F (36.7 C) 98.1 F (36.7 C)  TempSrc:  Rectal    SpO2:  99% (!) 89% (!) 88%  Weight:      Height:       Body mass index is 20.56 kg/m. Gen:  WD/WN, NAD Head: [place]/AT, No temporalis wasting.  Ear/Nose/Throat: Hearing grossly intact, nares w/o erythema or drainage, oropharynx w/o Erythema/Exudate Eyes: Sclera non-icteric, conjunctiva clear Neck: Trachea midline.  No JVD.  Pulmonary:  Good air movement, respirations not labored, equal bilaterally.  Cardiac: somewhat bradycardic Vascular:  Vessel Right Left  Radial Palpable Palpable   Musculoskeletal: M/S 5/5 throughout.  Extremities without ischemic changes.  No deformity or atrophy.  No right leg edema.  1-2+ left lower extremity edema Neurologic: Very sedated on a Precedex drip and difficult to assess Psychiatric: Sedated Dermatologic: No rashes or ulcers noted.  No cellulitis or open wounds.      CBC Lab Results  Component Value Date   WBC 6.5 02/25/2021   HGB 12.6 (L) 02/25/2021   HCT 37.9 (L) 02/25/2021   MCV 105.0 (H) 02/25/2021   PLT 171 02/25/2021    BMET    Component Value Date/Time   NA 135 02/25/2021 0630   K 4.1  02/25/2021 0630   CL 106 02/25/2021 0630   CO2 14 (L) 02/25/2021 0630   GLUCOSE 109 (H) 02/25/2021 0630   BUN 31 (H) 02/25/2021 0630   CREATININE 1.18 02/25/2021 0630   CALCIUM 8.9 02/25/2021 0630   GFRNONAA >60 02/25/2021 0630   GFRAA >60 10/31/2017 1507   Estimated Creatinine Clearance: 54.4 mL/min (by C-G formula based on SCr of 1.18 mg/dL).  COAG Lab Results  Component Value Date   INR 2.3 (H) 03/03/2021   INR 1.3 (H)  05/26/2020    Radiology CT HEAD WO CONTRAST  Result Date: 03/09/2021 CLINICAL DATA:  80 year old male status post unwitnessed fall. Epistaxis. Dementia. EXAM: CT HEAD WITHOUT CONTRAST TECHNIQUE: Contiguous axial images were obtained from the base of the skull through the vertex without intravenous contrast. COMPARISON:  Head CT 04/20/2020. FINDINGS: Brain: Chronically advanced and confluent bilateral cerebral white matter hypodensity has not significantly changed from last year. Chronic basal ganglia vascular calcifications. No midline shift, ventriculomegaly, mass effect, evidence of mass lesion, intracranial hemorrhage or evidence of cortically based acute infarction. Stable dural calcifications including along the falx. Vascular: Calcified atherosclerosis at the skull base. No suspicious intracranial vascular hyperdensity. Skull: Calvarium is intact.  Facial bones are reported separately. Sinuses/Orbits: Increased paranasal sinus mucosal thickening, with fluid and bubbly opacity now in the left frontal sinus and both frontoethmoidal recesses. Sphenoid and right maxillary sinuses remain clear. Tympanic cavities and mastoids are clear. Other: No orbit or scalp soft tissue injury identified. See also face CT reported separately. IMPRESSION: 1. No acute intracranial abnormality or acute traumatic injury identified. 2. Chronically advanced cerebral white matter disease. 3. See also Face CT reported separately. Electronically Signed   By: Genevie Ann M.D.   On: 02/28/2021 05:11   CT  CERVICAL SPINE WO CONTRAST  Result Date: 02/22/2021 CLINICAL DATA:  80 year old male status post unwitnessed fall. Epistaxis. Dementia. EXAM: CT CERVICAL SPINE WITHOUT CONTRAST TECHNIQUE: Multidetector CT imaging of the cervical spine was performed without intravenous contrast. Multiplanar CT image reconstructions were also generated. COMPARISON:  CT head and face today. FINDINGS: Alignment: Straightening of cervical lordosis. Mild anterolisthesis of C7 on T1 is associated with facet hypertrophy which is greater on the left. Bilateral posterior element alignment is within normal limits. Skull base and vertebrae: Visualized skull base is intact. No atlanto-occipital dissociation. C1 and C2 appear intact and aligned. No acute osseous abnormality identified. Soft tissues and spinal canal: No prevertebral fluid or swelling. No visible canal hematoma. Fairly abundant scattered but intravenous appearing neck soft tissue gas, likely related to recent IV access. Disc levels: Widespread advanced cervical spine degeneration is superimposed on congenital appearing spinal canal narrowing. Subsequently there is multilevel spinal stenosis which is at least moderate at C3-C4 through C5-C6. Upper chest: There is patchy opacity in the left lung apex. Visible upper thoracic levels appear grossly intact. IMPRESSION: 1. No acute traumatic injury identified in the cervical spine. Mild degenerative appearing anterolisthesis of C7 on T1. 2. 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 Subsequent widespread cervical spinal stenosis which is suspected to be moderate or greater C3-C4 through C5-C6. 3. Patchy opacity in the left lung apex. See Chest CT reported separately. Electronically Signed   By: Genevie Ann M.D.   On: 03/08/2021 05:21   CT CHEST ABDOMEN PELVIS W CONTRAST  Result Date: 02/18/2021 CLINICAL DATA:  80 year old male status post unwitnessed fall. Epistaxis. Dementia.  Possible sepsis. EXAM: CT CHEST, ABDOMEN, AND PELVIS WITH CONTRAST TECHNIQUE: Multidetector CT imaging of the chest, abdomen and pelvis was performed following the standard protocol during bolus administration of intravenous contrast. CONTRAST:  31m OMNIPAQUE IOHEXOL 350 MG/ML SOLN COMPARISON:  Cervical spine CT today. Chest CT 04/24/2020. CT Abdomen and Pelvis 04/20/2020. FINDINGS: CT CHEST FINDINGS Cardiovascular: Cardiomegaly has substantially progressed since last year. See series 2, image 43. And there is a somewhat stagnant appearance of left extremity and subclavian injected IV contrast plus some contrast reflux into the hepatic IVC and hepatic veins suggesting a degree of heart  failure. There are also enhancing left shoulder and paravertebral venous collaterals, and a small volume of intravenous gas at the neck. Calcified coronary artery atherosclerosis. Calcified aortic atherosclerosis. There is little contrast in the aorta. No pericardial effusion. Mediastinum/Nodes: No mediastinal hematoma or lymphadenopathy identified. Lungs/Pleura: Scattered patchy but confluent ground-glass opacity in both lungs which is both peripheral and peribronchial. Only the right upper lobe is relatively spared. Major airways remain patent. No consolidation or pleural effusion. There is some underlying centrilobular emphysema. No pneumothorax. Musculoskeletal: Respiratory motion artifact and osteopenia limit rib evaluation. Chronic left 7th through 9th rib fractures are redemonstrated. Healing fracture of the left posterolateral 10th rib is new since last year. A healing fracture of the right 12th rib is new since last year. Chronic anterior right 10th rib deformity appears stable. There is no obvious acute rib fracture. Sternum, visible shoulder osseous structures, and thoracic vertebrae also appear to remain intact. CT ABDOMEN PELVIS FINDINGS Hepatobiliary: Reflux of contrast from the right heart to the IVC and hepatic veins,  with otherwise little liver enhancement. No liver injury is evident. Grossly negative gallbladder. Pancreas: Essentially noncontrast appearance of the pancreas is negative. Spleen: Limited by motion artifact and lack of contrast. No obvious splenic injury. Adrenals/Urinary Tract: Nephrographic enhancement on the delayed images which also demonstrate abdominal aorta and other visceral enhancement. Normal adrenal glands. The kidneys appear symmetric, nonobstructed, and intact. Diminutive, unremarkable bladder. Stomach/Bowel: Generalized mesenteric congestion, edema. Retained air and stool in the rectum, but no dilated large or small bowel elsewhere. No free air or free fluid is identified. But otherwise bowel detail is limited. Vascular/Lymphatic: Extensive Aortoiliac calcified atherosclerosis. Normal caliber abdominal aorta. There is little intravascular contrast in the abdomen and pelvis. No lymphadenopathy identified. Reproductive: Sequelae of prostate brachytherapy again noted. Other: Generalized body wall edema is new from last year. No definite pelvic free fluid. Musculoskeletal: Mild L1 superior endplate compression fracture is new since last year. Less than 25% loss of vertebral body height with no retropulsion or complicating features. L2 vertebral body lucency which is likely hemangioma related is stable. Left ulna ORIF is visible. The sacrum, pelvis, and proximal femurs appear intact. IMPRESSION: 1. Patchy peribronchial and peripheral asymmetric ground-glass opacity in both lungs has a typical appearance of COVID-19 pneumonia. Other viral/atypical respiratory infection is possible. 2. Progressed cardiomegaly since last year with evidence of heart failure, including: Reflux of contrast into the liver, delayed systemic arterial enhancement, anasarca. 3. Mild L1 superior endplate compression fracture is new since last year and may be acute. No retropulsion or complicating features. 4. But the study is limited  by motion artifact with no other acute traumatic injury identified in the chest, abdomen, or pelvis. - osteopenia. - healing fractures of the left 10th and right 12th ribs are new from last year. 5. Extensive Aortic atherosclerosis (ICD10-I70.0). Electronically Signed   By: Genevie Ann M.D.   On: 03/03/2021 05:35   US Venous Img Lower Bilateral (DVT)  Result Date: 03/03/2021 CLINICAL DATA:  80 year old male with bilateral lower extremity edema. EXAM: BILATERAL LOWER EXTREMITY VENOUS DOPPLER ULTRASOUND TECHNIQUE: Gray-scale sonography with graded compression, as well as color Doppler and duplex ultrasound were performed to evaluate the lower extremity deep venous systems from the level of the common femoral vein and including the common femoral, femoral, profunda femoral, popliteal and calf veins including the posterior tibial, peroneal and gastrocnemius veins when visible. The superficial great saphenous vein was also interrogated. Spectral Doppler was utilized to evaluate flow at rest and  with distal augmentation maneuvers in the common femoral, femoral and popliteal veins. COMPARISON:  None. FINDINGS: RIGHT LOWER EXTREMITY Common Femoral Vein: No evidence of thrombus. Normal compressibility, respiratory phasicity and response to augmentation. Saphenofemoral Junction: No evidence of thrombus. Normal compressibility and flow on color Doppler imaging. Profunda Femoral Vein: Nonocclusive, expansile, acute appearing thrombus in the central profunda. Femoral Vein: No evidence of thrombus. Normal compressibility, respiratory phasicity and response to augmentation. Popliteal Vein: No evidence of thrombus. Normal compressibility, respiratory phasicity and response to augmentation. Calf Veins: Nonocclusive, acute appearing thrombus in the posterior tibial and peroneal veins. Other Findings:  None. LEFT LOWER EXTREMITY Common Femoral Vein: No evidence of thrombus. Normal compressibility, respiratory phasicity and response to  augmentation. Saphenofemoral Junction: No evidence of thrombus. Normal compressibility and flow on color Doppler imaging. Profunda Femoral Vein:Nonocclusive, expansile, acute appearing thrombus in the central profunda. Femoral Vein: No evidence of thrombus. Normal compressibility, respiratory phasicity and response to augmentation. Popliteal Vein: No evidence of thrombus. Normal compressibility, respiratory phasicity and response to augmentation. Calf Veins: Nonocclusive, acute appearing thrombus in the posterior tibial and peroneal veins. Other Findings:  None. IMPRESSION: Symmetric bilateral acute, nonocclusive deep vein thrombosis involving the bilateral profunda femoral and bilateral posterior tibial and peroneal veins. These results were called by telephone at the time of interpretation on 03/06/2021 at 10:54 am to provider [facility] , who verbally acknowledged these results. Ruthann Cancer, MD [facility] Electronically Signed   By: Ruthann Cancer M.D.   On: 03/09/2021 10:54   DG Chest Port 1 View  Result Date: 03/14/2021 CLINICAL DATA:  Questionable sepsis EXAM: PORTABLE CHEST 1 VIEW COMPARISON:  12/23/2020 FINDINGS: Cardiomegaly. Mild interstitial prominence could reflect interstitial edema. No confluent airspace opacity or effusion. IMPRESSION: Cardiomegaly.  Suspect mild interstitial edema. Electronically Signed   By: Rolm Baptise M.D.   On: 02/27/2021 02:35   DG Knee Complete 4 Views Left  Result Date: 02/25/2021 CLINICAL DATA:  Fall, left knee swelling EXAM: LEFT KNEE - COMPLETE 4+ VIEW COMPARISON:  None. FINDINGS: Anterior soft tissue swelling. No acute bony abnormality. Specifically, no fracture, subluxation, or dislocation. Joint spaces maintained. No joint effusion. IMPRESSION: No acute bony abnormality. Electronically Signed   By: Rolm Baptise M.D.   On: 02/23/2021 02:35   ECHOCARDIOGRAM COMPLETE  Result Date: 02/18/2021     ECHOCARDIOGRAM REPORT   Patient Name:   Craig [name] Date of Exam: 02/27/2021 Medical Rec #:  [id number]            Height:       75.0 in Accession #:    [id number]           Weight:       168.9 lb Date of Birth:  09/26/1940             BSA:          2.042 m Patient Age:    76 years             BP:           101/74 mmHg Patient Gender: M                    HR:           61 bpm. Exam Location:  [facility] Procedure: 2D Echo, Color Doppler and Cardiac Doppler Indications:     I50.21 congestive heart failure-Acute Systolic  History:         Patient has  prior history of Echocardiogram examinations, most                  recent 12/23/2020. CAD, TIA; Risk Factors:Hypertension and                  Dyslipidemia. Pt tested positive for COVID-19 on 02/21/2021.  Sonographer:     Charmayne Sheer Referring Phys:  [id number] Bradly Bienenstock Diagnosing Phys: Yolonda Kida MD  Sonographer Comments: Suboptimal subcostal window and suboptimal apical window. IMPRESSIONS  1. Left ventricular ejection fraction, by estimation, is 30 to 35%. The left ventricle has moderately decreased function. The left ventricle demonstrates global hypokinesis. The left ventricular internal cavity size was mildly to moderately dilated. Left ventricular diastolic parameters were normal. There is the interventricular septum is flattened in diastole ('D' shaped left ventricle), consistent with right ventricular volume overload.  2. Right ventricular systolic function is severely reduced. The right ventricular size is moderately enlarged.  3. Left atrial size was mild to moderately dilated.  4. Right atrial size was mild to moderately dilated.  5. The mitral valve is normal in structure. Mild to moderate mitral valve regurgitation.  6. Tricuspid valve regurgitation is mild to moderate.  7. The aortic valve is normal in structure. Aortic valve regurgitation is trivial. FINDINGS  Left Ventricle: Left ventricular ejection fraction, by estimation, is 30 to 35%. The left  ventricle has moderately decreased function. The left ventricle demonstrates global hypokinesis. The left ventricular internal cavity size was mildly to moderately  dilated. There is no concentric left ventricular hypertrophy. The interventricular septum is flattened in diastole ('D' shaped left ventricle), consistent with right ventricular volume overload. Left ventricular diastolic parameters were normal. Right Ventricle: The right ventricular size is moderately enlarged. No increase in right ventricular wall thickness. Right ventricular systolic function is severely reduced. Left Atrium: Left atrial size was mild to moderately dilated. Right Atrium: Right atrial size was mild to moderately dilated. Pericardium: There is no evidence of pericardial effusion. Mitral Valve: The mitral valve is normal in structure. Mild to moderate mitral valve regurgitation. MV peak gradient, 1.3 mmHg. The mean mitral valve gradient is 1.0 mmHg. Tricuspid Valve: The tricuspid valve is grossly normal. Tricuspid valve regurgitation is mild to moderate. Aortic Valve: The aortic valve is normal in structure. Aortic valve regurgitation is trivial. Aortic valve mean gradient measures 1.0 mmHg. Aortic valve peak gradient measures 1.8 mmHg. Aortic valve area, by VTI measures 2.72 cm. Pulmonic Valve: The pulmonic valve was grossly normal. Pulmonic valve regurgitation is not visualized. Aorta: The ascending aorta was not well visualized. IAS/Shunts: No atrial level shunt detected by color flow Doppler.  LEFT VENTRICLE PLAX 2D LVIDd:         5.40 cm LVIDs:         4.60 cm LV PW:         1.30 cm LV IVS:        1.00 cm LVOT diam:     2.30 cm LV SV:         28 LV SV Index:   14 LVOT Area:     4.15 cm  RIGHT VENTRICLE RV Basal diam:  4.00 cm LEFT ATRIUM           Index       RIGHT ATRIUM           Index LA diam:      4.10 cm 2.01 cm/m  RA Area:     38.10 cm LA Vol (  A4C): 27.7 ml 13.56 ml/m RA Volume:   158.00 ml 77.37 ml/m  AORTIC VALVE                    PULMONIC VALVE AV Area (Vmax):    2.76 cm    PV Vmax:          0.51 m/s AV Area (Vmean):   2.64 cm    PV Vmean:         34.300 cm/s AV Area (VTI):     2.72 cm    PV VTI:           0.076 m AV Vmax:           67.90 cm/s  PV Peak grad:     1.0 mmHg AV Vmean:          46.500 cm/s PV Mean grad:     1.0 mmHg AV VTI:            0.104 m     PR End Diast Vel: 9.36 msec AV Peak Grad:      1.8 mmHg AV Mean Grad:      1.0 mmHg LVOT Vmax:         45.10 cm/s LVOT Vmean:        29.500 cm/s LVOT VTI:          0.068 m LVOT/AV VTI ratio: 0.66  AORTA Ao Root diam: 3.80 cm MITRAL VALVE               TRICUSPID VALVE MV Area (PHT): 7.29 cm    TR Peak grad:   25.0 mmHg MV Area VTI:   2.40 cm    TR Vmax:        250.00 cm/s MV Peak grad:  1.3 mmHg MV Mean grad:  1.0 mmHg    SHUNTS MV Vmax:       0.58 m/s    Systemic VTI:  0.07 m MV Vmean:      39.1 cm/s   Systemic Diam: 2.30 cm MV Decel Time: 104 msec MV E velocity: 45.40 cm/s MV A velocity: 35.60 cm/s MV E/A ratio:  1.28 Dwayne Prince Rome MD Electronically signed by Yolonda Kida MD Signature Date/Time: 03/10/2021/10:07:48 PM    Final    CT MAXILLOFACIAL WO CONTRAST  Result Date: 03/12/2021 CLINICAL DATA:  80 year old male status post unwitnessed fall. Epistaxis. Dementia. EXAM: CT MAXILLOFACIAL WITHOUT CONTRAST TECHNIQUE: Multidetector CT imaging of the maxillofacial structures was performed. Multiplanar CT image reconstructions were also generated. COMPARISON:  Head CT today.  Head CT 04/20/2020. FINDINGS: Osseous: Mandible is intact and normally located with some bilateral TMJ degeneration. Scattered dental caries. Maxilla remain intact. No zygoma fracture. No pterygoid fracture. Bilateral nasal bones appear to remain intact but there is trace posttraumatic appearing soft tissue gas along the bridge of the nose and anterior nasal septum series 3, image 33. Intact central skull base.  Cervical spine reported separately. Orbits: No orbital wall fracture. Orbits soft  tissues appears symmetric and within normal limits. Sinuses: Rightward nasal septal deviation is age indeterminate and might be posttraumatic. But the nasal cavity is well aerated. Some of the bubbly opacity in the bilateral frontoethmoidal recesses and left frontal sinus might be hemorrhage. But there is also trace mucosal thickening. Right maxillary, posterior ethmoid, sphenoid sinuses, tympanic cavities and mastoids are clear. Soft tissues: A small volume of gas in the bilateral submandibular space appears to be intravenous, likely related to recent IV access. No deep  soft tissue space traumatic injury is evident in the absence of IV contrast. There is mild cervical carotid calcified atherosclerosis. Limited intracranial: Stable to that reported separately today. IMPRESSION: 1. Trace posttraumatic soft tissue gas at the bridge of the nose and anterior nasal septum - with possible acute posttraumatic septal deviation. 2. But no nasal bone or other acute facial fracture is identified. 3. Trace soft tissue gas in the bilateral neck appears to be intravenous and is likely related to recent IV access. 4. Scattered dental caries. Electronically Signed   By: Genevie Ann M.D.   On: 03/10/2021 05:17      Assessment/Plan 1.  Bilateral lower extremity DVT.  The right leg is not particularly swollen.  The left leg is swollen.  The patient is nearly obtunded on a Precedex drip so it is difficult to assess for pain.  Particularly with his acute COVID-19 infection and other issues, I would just continue anticoagulation on him for the time being.  We have up to 2 to 3 weeks to perform thrombectomy if he is highly symptomatic once his overall clinical condition improves.  Elevate his legs as tolerated.  Activity is fine but I would not use SCDs at this time. 2.  COVID-19 infection.  Fairly significant.  In the ICU although not ventilated.  Pulmonary following. 3.  Possible pulmonary embolus.  CT scan of the chest abdomen  pelvis from earlier today independently reviewed he may have some small lobar pulmonary embolus in the right lower lobe, but this is small if present and I would not recommend thrombectomy.  This was not mentioned by the radiologist and this was not a CT angiogram of the chest so it is difficult to discern.  Clearly, there is no large volume pulmonary embolus on the scan.   Leotis Pain, MD  02/25/2021 5:53 PM    This note was created with Dragon medical transcription system.  Any error is purely unintentional

## 2021-02-26 LAB — COMPREHENSIVE METABOLIC PANEL
ALT: 154 U/L — ABNORMAL HIGH (ref 0–44)
AST: 221 U/L — ABNORMAL HIGH (ref 15–41)
Albumin: 2.2 g/dL — ABNORMAL LOW (ref 3.5–5.0)
Alkaline Phosphatase: 82 U/L (ref 38–126)
Anion gap: 18 — ABNORMAL HIGH (ref 5–15)
BUN: 33 mg/dL — ABNORMAL HIGH (ref 8–23)
CO2: 17 mmol/L — ABNORMAL LOW (ref 22–32)
Calcium: 9.1 mg/dL (ref 8.9–10.3)
Chloride: 107 mmol/L (ref 98–111)
Creatinine, Ser: 1.18 mg/dL (ref 0.61–1.24)
GFR, Estimated: >60 mL/min (ref >60)
Glucose, Bld: 73 mg/dL (ref 70–99)
Potassium: 3.9 mmol/L (ref 3.5–5.1)
Sodium: 142 mmol/L (ref 135–145)
Total Bilirubin: 2.2 mg/dL — ABNORMAL HIGH (ref 0.3–1.2)
Total Protein: 7.6 g/dL (ref 6.5–8.1)

## 2021-02-26 LAB — CBC WITH DIFFERENTIAL/PLATELET
Abs Immature Granulocytes: 0.09 10*3/uL — ABNORMAL HIGH (ref 0.00–0.07)
Basophils Absolute: 0 10*3/uL (ref 0.0–0.1)
Basophils Relative: 0 %
Eosinophils Absolute: 0 10*3/uL (ref 0.0–0.5)
Eosinophils Relative: 0 %
HCT: 42.6 % (ref 39.0–52.0)
Hemoglobin: 13.4 g/dL (ref 13.0–17.0)
Immature Granulocytes: 1 %
Lymphocytes Relative: 1 %
Lymphs Abs: 0.1 10*3/uL — ABNORMAL LOW (ref 0.7–4.0)
MCH: 32.8 pg (ref 26.0–34.0)
MCHC: 31.5 g/dL (ref 30.0–36.0)
MCV: 104.4 fL — ABNORMAL HIGH (ref 80.0–100.0)
Monocytes Absolute: 0.5 10*3/uL (ref 0.1–1.0)
Monocytes Relative: 4 %
Neutro Abs: 11.7 10*3/uL — ABNORMAL HIGH (ref 1.7–7.7)
Neutrophils Relative %: 94 %
Platelets: 160 10*3/uL (ref 150–400)
RBC: 4.08 MIL/uL — ABNORMAL LOW (ref 4.22–5.81)
RDW: 20.4 % — ABNORMAL HIGH (ref 11.5–15.5)
Smear Review: NORMAL
WBC: 12.5 10*3/uL — ABNORMAL HIGH (ref 4.0–10.5)
nRBC: 4.3 % — ABNORMAL HIGH (ref 0.0–0.2)

## 2021-02-26 LAB — GLUCOSE, CAPILLARY
Glucose-Capillary: 51 mg/dL — ABNORMAL LOW (ref 70–99)
Glucose-Capillary: 78 mg/dL (ref 70–99)
Glucose-Capillary: 34 mg/dL — CL (ref 70–99)
Glucose-Capillary: 125 mg/dL — ABNORMAL HIGH (ref 70–99)
Glucose-Capillary: 75 mg/dL (ref 70–99)
Glucose-Capillary: 90 mg/dL (ref 70–99)

## 2021-02-26 LAB — FERRITIN: Ferritin: 1800 ng/mL — ABNORMAL HIGH (ref 24–336)

## 2021-02-26 LAB — PHOSPHORUS: Phosphorus: 3 mg/dL (ref 2.5–4.6)

## 2021-02-26 LAB — BASIC METABOLIC PANEL
Anion gap: 19 — ABNORMAL HIGH (ref 5–15)
Anion gap: 17 — ABNORMAL HIGH (ref 5–15)
BUN: 35 mg/dL — ABNORMAL HIGH (ref 8–23)
BUN: 35 mg/dL — ABNORMAL HIGH (ref 8–23)
CO2: 16 mmol/L — ABNORMAL LOW (ref 22–32)
CO2: 17 mmol/L — ABNORMAL LOW (ref 22–32)
Calcium: 9.1 mg/dL (ref 8.9–10.3)
Calcium: 8.9 mg/dL (ref 8.9–10.3)
Chloride: 107 mmol/L (ref 98–111)
Chloride: 107 mmol/L (ref 98–111)
Creatinine, Ser: 1.19 mg/dL (ref 0.61–1.24)
Creatinine, Ser: 1.21 mg/dL (ref 0.61–1.24)
GFR, Estimated: >60 mL/min (ref >60)
GFR, Estimated: >60 mL/min (ref >60)
Glucose, Bld: 103 mg/dL — ABNORMAL HIGH (ref 70–99)
Glucose, Bld: 129 mg/dL — ABNORMAL HIGH (ref 70–99)
Potassium: 3.8 mmol/L (ref 3.5–5.1)
Potassium: 4.4 mmol/L (ref 3.5–5.1)
Sodium: 142 mmol/L (ref 135–145)
Sodium: 141 mmol/L (ref 135–145)

## 2021-02-26 LAB — HEPARIN LEVEL (UNFRACTIONATED)
Heparin Unfractionated: 0.84 IU/mL — ABNORMAL HIGH (ref 0.30–0.70)
Heparin Unfractionated: 0.47 IU/mL (ref 0.30–0.70)

## 2021-02-26 LAB — D-DIMER, QUANTITATIVE: D-Dimer, Quant: 3.13 ug/mL-FEU — ABNORMAL HIGH (ref 0.00–0.50)

## 2021-02-26 LAB — C-REACTIVE PROTEIN: CRP: 3.5 mg/dL — ABNORMAL HIGH (ref <1.0)

## 2021-02-26 LAB — MAGNESIUM: Magnesium: 2 mg/dL (ref 1.7–2.4)

## 2021-02-26 MED ORDER — DEXTROSE 50 % IV SOLN
INTRAVENOUS | Status: AC
  Filled 2021-02-26: qty 50

## 2021-02-26 MED ORDER — DEXTROSE 50 % IV SOLN
INTRAVENOUS | Status: AC
  Administered 2021-02-26: 50 mL
  Filled 2021-02-26: qty 50

## 2021-02-26 MED ORDER — DEXTROSE 50 % IV SOLN
25.0000 g | INTRAVENOUS | Status: AC
  Administered 2021-02-26: 25 g via INTRAVENOUS
  Filled 2021-02-26: qty 50

## 2021-02-26 MED ORDER — LORAZEPAM 2 MG/ML IJ SOLN
1.0000 mg | Freq: Four times a day (QID) | INTRAMUSCULAR | Status: DC | PRN
  Administered 2021-02-26 – 2021-02-28 (×3): 2 mg via INTRAVENOUS
  Filled 2021-02-26 (×4): qty 1

## 2021-02-26 MED ORDER — LORAZEPAM 2 MG/ML PO CONC
1.0000 mg | Freq: Four times a day (QID) | ORAL | Status: DC | PRN
  Filled 2021-02-26: qty 1

## 2021-02-26 MED ORDER — LORAZEPAM 2 MG/ML IJ SOLN
INTRAMUSCULAR | Status: AC
  Administered 2021-02-26: 2 mg
  Filled 2021-02-26: qty 1

## 2021-02-26 MED ORDER — MIDODRINE HCL 5 MG PO TABS
10.0000 mg | ORAL_TABLET | Freq: Three times a day (TID) | ORAL | Status: DC
  Administered 2021-02-27: 10 mg via ORAL
  Filled 2021-02-26 (×2): qty 2

## 2021-02-26 MED ORDER — LORAZEPAM 2 MG/ML IJ SOLN: 2.0000 mg | Freq: Once | INTRAMUSCULAR | Status: AC

## 2021-02-26 NOTE — Progress Notes (Signed)
GOALS OF CARE FAMILY CONFERENCE   Current clinical status, hospital findings and medical plan was reviewed with family.   Updated and notified of patients ongoing immediate critical medical problems.   I spoke with daughter of patient Craig [name], we reviewed care plan and hospital course including advanced heart failure, multiple comorbid conditions and aggitated delirium.  Further goals of care discussion with instructions from [facility] include DNR code status and very limited nourishement via feeding tube as well as very limited mechanical ventilation if absolutely necessary.    Explained to family course of therapy and the modalities   Family is appreciative of care and relate understanding that patient is severely critically ill with anticipation of passing away during this hospitalization.   They have consented and agreed to DNR/DNI  Code status   Family are satisfied with Plan of action and management. All questions answered  Additional Critical Care time 35 mins    Ottie Glazier, M.D.  Pulmonary & [place]

## 2021-02-26 NOTE — Progress Notes (Signed)
ANTICOAGULATION CONSULT NOTE  Pharmacy Consult for heparin Indication: DVT  Allergies  Allergen Reactions   Penicillins     Patient Measurements: Height: '6\' 3"'$  (190.5 cm) Weight: 77.5 kg (170 lb 13.7 oz) IBW/kg (Calculated) : 84.5 Heparin Dosing Weight: 76 kg  Vital Signs: Temp: 96.4 F (35.8 C) (09/10 1800) Temp Source: Rectal (09/10 1500) BP: 117/105 (09/10 1800) Pulse Rate: 83 (09/10 1700)  Labs: Recent Labs     0000 02/23/2021 0144 03/06/2021 0250 02/22/2021 0455 03/17/2021 2023 02/25/21 0630 02/26/21 0719 02/26/21 1005 02/26/21 1006 02/26/21 1435 02/26/21 2108  HGB  --  12.7*  --   --   --  12.6* 13.4  --   --   --   --   HCT  --  40.0  --   --   --  37.9* 42.6  --   --   --   --   PLT  --  188  --   --   --  171 160  --   --   --   --   APTT  --   --  36  --   --   --   --   --   --   --   --   LABPROT  --   --  25.2*  --   --   --   --   --   --   --   --   INR  --   --  2.3*  --   --   --   --   --   --   --   --   HEPARINUNFRC  --   --   --   --    < > 0.60  --   --  0.84*  --  0.47  CREATININE   < >  --  1.27*  --   --  1.18 1.18 1.19  --  1.21  --   CKTOTAL  --   --  62  --   --   --   --   --   --   --   --   TROPONINIHS  --  51*  --  48*  --   --   --   --   --   --   --    < > = values in this interval not displayed.     Estimated Creatinine Clearance: 55.2 mL/min (by C-G formula based on SCr of 1.21 mg/dL).   Medical History: Past Medical History:  Diagnosis Date   Atrial flutter ([place])    Coronary artery disease    Hyperlipidemia    Hypertension    TIA (transient ischemic attack)      Assessment: 80 year old male admitted after a fall at facility. History of atrial flutter, no anticoagulation at baseline. Patient found to be Covid positive and started on remdesivir and steroids. Ultrasound lower extremities positive symmetric bilateral acute, nonocclusive DVT involving the bilateral profunda femoral and bilateral posterior tibial and peroneal  veins.  INR elevated at 2.3 on admission.  Date Time HL Rate/comment 9/8  2023 0.48 9/9  0630 0.60 1200 units/hr 9/10  1006 0.84 1200 units/hr 9/10 2108 0.47 1050 units/hr   Goal of Therapy:  Heparin level 0.3-0.7 units/ml Monitor platelets by anticoagulation protocol: Yes   Plan:  --9/10 2108 HL 0.47 therapeutic --Continue heparin infusion at 1050 units/hr --Check HL in 8 hrs --CBC daily while on  heparin  Darnelle Bos, PharmD Clinical Pharmacist 02/26/2021 10:04 PM

## 2021-02-26 NOTE — Progress Notes (Signed)
Assisted tele visit to patient with daughter.  Westlee Devita M, RN  

## 2021-02-26 NOTE — Progress Notes (Signed)
Subjective/Chief Complaint: Patient resting comfortably. States that he doesn't want anything.    Objective: Vital signs in last 24 hours: Temp:  [96.3 F (35.7 C)-98.1 F (36.7 C)] 96.3 F (35.7 C) (09/10 0800) Pulse Rate:  [38-95] 72 (09/10 0800) Resp:  [8-32] 12 (09/10 0800) BP: (72-158)/(54-131) 113/73 (09/10 0700) SpO2:  [88 %-100 %] 100 % (09/10 0800) Weight:  [77.5 kg] 77.5 kg (09/10 0446) Last BM Date:  (PTA)  Intake/Output from previous day: 09/09 0701 - 09/10 0700 In: 818.6 [I.V.:818.6] Out: 335 [Urine:335] Intake/Output this shift: No intake/output data recorded.  General appearance: alert and no distress Head: Normocephalic, without obvious abnormality, atraumatic Neck: no adenopathy, no carotid bruit, no JVD, supple, symmetrical, trachea midline, and thyroid not enlarged, symmetric, no tenderness/mass/nodules Back: symmetric, no curvature. ROM normal. No CVA tenderness. Resp: clear to auscultation bilaterally Extremities: extremities normal, atraumatic, no cyanosis or edema  Lab Results:  Recent Labs    02/25/21 0630 02/26/21 0719  WBC 6.5 12.5*  HGB 12.6* 13.4  HCT 37.9* 42.6  PLT 171 160   BMET Recent Labs    02/25/21 0630 02/26/21 0719  NA 135 142  K 4.1 3.9  CL 106 107  CO2 14* 17*  GLUCOSE 109* 73  BUN 31* 33*  CREATININE 1.18 1.18  CALCIUM 8.9 9.1   PT/INR Recent Labs    03/11/2021 0250  LABPROT 25.2*  INR 2.3*   ABG No results for input(s): PHART, HCO3 in the last 72 hours.  Invalid input(s): PCO2, PO2  Studies/Results: ECHOCARDIOGRAM COMPLETE  Result Date: 03/03/2021    ECHOCARDIOGRAM REPORT   Patient Name:   LUCION WEGE Clary Date of Exam: 03/16/2021 Medical Rec #:  [id number]            Height:       75.0 in Accession #:    [id number]           Weight:       168.9 lb Date of Birth:  [birth year]             BSA:          2.042 m Patient Age:    80 years             BP:           101/74 mmHg Patient Gender: M                     HR:           61 bpm. Exam Location:  [facility] Procedure: 2D Echo, Color Doppler and Cardiac Doppler Indications:     I50.21 congestive heart failure-Acute Systolic  History:         Patient has prior history of Echocardiogram examinations, most                  recent 12/23/2020. CAD, TIA; Risk Factors:Hypertension and                  Dyslipidemia. Pt tested positive for COVID-19 on 03/06/2021.  Sonographer:     Charmayne Sheer Referring Phys:  [id number] Bradly Bienenstock Diagnosing Phys: Yolonda Kida MD  Sonographer Comments: Suboptimal subcostal window and suboptimal apical window. IMPRESSIONS  1. Left ventricular ejection fraction, by estimation, is 30 to 35%. The left ventricle has moderately decreased function. The left ventricle demonstrates global hypokinesis. The left ventricular internal cavity size was mildly to moderately dilated. Left ventricular diastolic parameters were normal. There is  the interventricular septum is flattened in diastole ('D' shaped left ventricle), consistent with right ventricular volume overload.  2. Right ventricular systolic function is severely reduced. The right ventricular size is moderately enlarged.  3. Left atrial size was mild to moderately dilated.  4. Right atrial size was mild to moderately dilated.  5. The mitral valve is normal in structure. Mild to moderate mitral valve regurgitation.  6. Tricuspid valve regurgitation is mild to moderate.  7. The aortic valve is normal in structure. Aortic valve regurgitation is trivial. FINDINGS  Left Ventricle: Left ventricular ejection fraction, by estimation, is 30 to 35%. The left ventricle has moderately decreased function. The left ventricle demonstrates global hypokinesis. The left ventricular internal cavity size was mildly to moderately  dilated. There is no concentric left ventricular hypertrophy. The interventricular septum is flattened in diastole ('D' shaped left ventricle), consistent with right ventricular volume overload.  Left ventricular diastolic parameters were normal. Right Ventricle: The right ventricular size is moderately enlarged. No increase in right ventricular wall thickness. Right ventricular systolic function is severely reduced. Left Atrium: Left atrial size was mild to moderately dilated. Right Atrium: Right atrial size was mild to moderately dilated. Pericardium: There is no evidence of pericardial effusion. Mitral Valve: The mitral valve is normal in structure. Mild to moderate mitral valve regurgitation. MV peak gradient, 1.3 mmHg. The mean mitral valve gradient is 1.0 mmHg. Tricuspid Valve: The tricuspid valve is grossly normal. Tricuspid valve regurgitation is mild to moderate. Aortic Valve: The aortic valve is normal in structure. Aortic valve regurgitation is trivial. Aortic valve mean gradient measures 1.0 mmHg. Aortic valve peak gradient measures 1.8 mmHg. Aortic valve area, by VTI measures 2.72 cm. Pulmonic Valve: The pulmonic valve was grossly normal. Pulmonic valve regurgitation is not visualized. Aorta: The ascending aorta was not well visualized. IAS/Shunts: No atrial level shunt detected by color flow Doppler.  LEFT VENTRICLE PLAX 2D LVIDd:         5.40 cm LVIDs:         4.60 cm LV PW:         1.30 cm LV IVS:        1.00 cm LVOT diam:     2.30 cm LV SV:         28 LV SV Index:   14 LVOT Area:     4.15 cm  RIGHT VENTRICLE RV Basal diam:  4.00 cm LEFT ATRIUM           Index       RIGHT ATRIUM           Index LA diam:      4.10 cm 2.01 cm/m  RA Area:     38.10 cm LA Vol (A4C): 27.7 ml 13.56 ml/m RA Volume:   158.00 ml 77.37 ml/m  AORTIC VALVE                   PULMONIC VALVE AV Area (Vmax):    2.76 cm    PV Vmax:          0.51 m/s AV Area (Vmean):   2.64 cm    PV Vmean:         34.300 cm/s AV Area (VTI):     2.72 cm    PV VTI:           0.076 m AV Vmax:           67.90 cm/s  PV Peak grad:     1.0  mmHg AV Vmean:          46.500 cm/s PV Mean grad:     1.0 mmHg AV VTI:            0.104 m     PR End  Diast Vel: 9.36 msec AV Peak Grad:      1.8 mmHg AV Mean Grad:      1.0 mmHg LVOT Vmax:         45.10 cm/s LVOT Vmean:        29.500 cm/s LVOT VTI:          0.068 m LVOT/AV VTI ratio: 0.66  AORTA Ao Root diam: 3.80 cm MITRAL VALVE               TRICUSPID VALVE MV Area (PHT): 7.29 cm    TR Peak grad:   25.0 mmHg MV Area VTI:   2.40 cm    TR Vmax:        250.00 cm/s MV Peak grad:  1.3 mmHg MV Mean grad:  1.0 mmHg    SHUNTS MV Vmax:       0.58 m/s    Systemic VTI:  0.07 m MV Vmean:      39.1 cm/s   Systemic Diam: 2.30 cm MV Decel Time: 104 msec MV E velocity: 45.40 cm/s MV A velocity: 35.60 cm/s MV E/A ratio:  1.28 Dwayne Prince Rome MD Electronically signed by Yolonda Kida MD Signature Date/Time: 02/17/2021/10:07:48 PM    Final     Anti-infectives: Anti-infectives (From admission, onward)    Start     Dose/Rate Route Frequency Ordered Stop   02/25/21 1000  remdesivir 100 mg in sodium chloride 0.9 % 100 mL IVPB       See Hyperspace for full Linked Orders Report.   100 mg 200 mL/hr over 30 Minutes Intravenous Daily 03/18/2021 0642 03/01/21 0959   02/19/2021 2000  vancomycin (VANCOREADY) IVPB 1250 mg/250 mL  Status:  Discontinued        1,250 mg 166.7 mL/hr over 90 Minutes Intravenous Every 24 hours 02/18/2021 0702 03/11/2021 1037   02/19/2021 1400  ceFEPIme (MAXIPIME) 2 g in sodium chloride 0.9 % 100 mL IVPB  Status:  Discontinued        2 g 200 mL/hr over 30 Minutes Intravenous Every 12 hours 03/05/2021 0659 03/11/2021 1037   02/17/2021 0730  remdesivir 200 mg in sodium chloride 0.9% 250 mL IVPB       See Hyperspace for full Linked Orders Report.   200 mg 580 mL/hr over 30 Minutes Intravenous Once 02/22/2021 0642 03/09/2021 1938   03/15/2021 0215  ceFEPIme (MAXIPIME) 2 g in sodium chloride 0.9 % 100 mL IVPB        2 g 200 mL/hr over 30 Minutes Intravenous  Once 02/20/2021 0200 03/11/2021 0306   03/09/2021 0215  vancomycin (VANCOCIN) IVPB 1000 mg/200 mL premix        1,000 mg 200 mL/hr over 60 Minutes Intravenous  Once  02/22/2021 0200 02/20/2021 0346       Assessment/Plan: s/p * No surgery found * Will follow as needed.   LOS: 2 days    Nesanel Nykaza 02/26/2021

## 2021-02-26 NOTE — Progress Notes (Signed)
PHARMACY CONSULT NOTE - FOLLOW UP  Pharmacy Consult for Electrolyte Monitoring and Replacement   Recent Labs: Potassium (mmol/L)  Date Value  02/26/2021 3.9   Magnesium (mg/dL)  Date Value  02/26/2021 2.0   Calcium (mg/dL)  Date Value  02/26/2021 9.1   Albumin (g/dL)  Date Value  02/26/2021 2.2 (L)   Phosphorus (mg/dL)  Date Value  02/26/2021 3.0   Sodium (mmol/L)  Date Value  02/26/2021 142     Assessment: 80 year old male admitted after a fall at facility. History of atrial flutter, no anticoagulation at baseline. Patient found to be Covid positive and started on remdesivir and steroids. Also found to have DVT bilateral lower extremities. Pharmacy consult for electrolyte management.  Goal of Therapy:  Electrolytes WNL  Plan:  --no replacement at this time --f/u electrolytes with morning labs  Noralee Space, PharmD Clinical Pharmacist 02/26/2021 10:13 AM

## 2021-02-26 NOTE — Progress Notes (Signed)
NAME:  Craig [name], MRN:  [id number], DOB:  [birth year], LOS: 2 ADMISSION DATE:  03/12/2021, CONSULTATION DATE: 03/10/2021 REFERRING MD: Michel Harrow, DO, CHIEF COMPLAINT: Unwitnessed fall   HPI  80 y.o with significant PMH as below who presented to the ED from [place] with unwitnessed fall.  Patient has history of dementia and poor historian.  History obtained from patient's chart.  Per EMS run sheet and ED notes, patient was found down after sustaining unwitnessed fall with injuries noted in the nasal bridge, and right nare, swelling to bilateral lower extremities and left knee.  Per staff at the facility, patient had been complaining of left knee pain prior to fall.  He was also noted with bleeding from his nose which has since subsided.  He is confused at baseline due to history of dementia.  ED Course: On arrival to the ED, he was hypothermic temp of(!) 94.8 F (34.9 C) with blood pressure 92/86 mm Hg and pulse rate (!) 110 beats/min, respiration 14 and oxygen saturation 98% on room air. There were no focal neurological deficits; he was alert and oriented to self only.  Initial pertinent labs/Diagnostics WBC/Hgb/Hct/Plts:  12.5/13.4/42.6/160 (09/10 0719)  CO2 17, glucose 123, BUN/creatinine 30/1.27, calcium 8.6, AST/ALT 67/52 total bilirubin 2.3, anion gap 17 UA: Pyuria there was no evidence of UTI.  Urine culture pending Lactate: 8.6>>7.4 Troponin: 51 PT/INR: 25.2/2.3 BNP > 4500 SARS Coronavirus 2 by RT PCR POSITIVE Abnormal  EKG: normal EKG, normal sinus rhythm, unchanged from previous tracings, ST depression in lateral leads. Imaging: CT head negative for acute intracranial abnormality however CT maxillofacial and cervical spine showed posttraumatic nasal septal deviation but no nasal fracture as well as mild L1 compression fracture without retropulsion.  Given initial concerns for sepsis, septic work-up was initiated and patient received IV fluids and  started on broad broad spectrum antibiotics.  Pro-Cal came back normal however BNP was markedly elevated concerning for cardiogenic in nature.  Bedside ultrasound was performed by EDP which revealed very poor cardiac squeeze.  Patient received IV Lasix.  PCCM consulted for admission to ICU for further work-up and management.  02/26/21- patient continues to have aggitated hyperactive delerium responsive to IV ativan, he also had recurrent hypoglecymic episodes.  Patient with poor prognosis.    Past Medical History  Acute on chronic HFrEF Atrial fibrillation SBO secondary to incarcerated right inguinal hernia and right spermatic cord abscess s/p right inguinal hernia repair, small bowel resection, and right orchiectomy-05/27/20 Dementia with behavioral disturbances Bradycardia CAD Hyperlipidemia Hypertension TIA  Significant Hospital Events   9/8: Admitted to ICU with sepsis secondary to COVID-pneumonia and traumatic fall  Consults:  PCCM  Procedures:  None  Significant Diagnostic Tests:  9/8: Chest Xray>mild interstitial edema 9/8: Noncontrast CT head> no acute intracranial abnormality 9/8: CTA Chest, abdomen and pelvis>Patchy peribronchial and peripheral asymmetric ground-glass opacity in both lungs has a typical appearance of COVID-19 pneumonia.  Micro Data:  9/8: SARS-CoV-2 PCR> positive 9/8: Influenza PCR> negative 9/8: Blood culture x2> 9/8: Urine Culture> 9/80: MRSA PCR>>  9/8: Strep pneumo urinary antigen> 9/8: Legionella urinary antigen> 9/8: Mycoplasma pneumonia>  Antimicrobials:  Vancomycin 9/8>> Cefepime 9/8>>  OBJECTIVE  Blood pressure 114/85, pulse 76, temperature (!) 96.6 F (35.9 C), temperature source Rectal, resp. rate 14, height '6\' 3"'$  (1.905 m), weight 77.5 kg, SpO2 98 %.        Intake/Output Summary (Last 24 hours) at 02/26/2021 1418 Last data filed at 02/26/2021 1200 Gross per 24 hour  Intake 939.23 ml  Output 95 ml  Net 844.23 ml    Filed  Weights   02/22/2021 0615 02/25/21 0500 02/26/21 0446  Weight: 76.6 kg 74.6 kg 77.5 kg    Physical Examination  GENERAL: 80 year-old critically ill patient lying in the bed with no acute distress.  EYES: Pupils equal, round, reactive to light and accommodation. No scleral icterus. Extraocular muscles intact.  HEENT: Head atraumatic, normocephalic. Oropharynx and nasopharynx with dry blood and bruising NECK:  Supple, no jugular venous distention. No thyroid enlargement, no tenderness.  LUNGS: Normal breath sounds bilaterally, no wheezing, rales,rhonchi or crepitation. No use of accessory muscles of respiration.  CARDIOVASCULAR: S1, S2 normal. No murmurs, rubs, or gallops.  ABDOMEN: Soft, nontender, nondistended. Bowel sounds present. No organomegaly or mass.  EXTREMITIES: 3+ pitting edema, bilateral knee swelling L>R. No cyanosis, or clubbing.  NEUROLOGIC: Cranial nerves II through XII are intact.  Muscle strength 4/5 in BUE extremities. Sensation intact. Gait not checked.  PSYCHIATRIC: The patient is alert and oriented x 1  SKIN: No obvious rash, lesion, or ulcer.   Labs/imaging that I havepersonally reviewed  (right click and "Reselect all SmartList Selections" daily)     Labs   CBC: Recent Labs  Lab 03/03/2021 0144 02/25/21 0630 02/26/21 0719  WBC 5.9 6.5 12.5*  NEUTROABS 5.0 5.8 11.7*  HGB 12.7* 12.6* 13.4  HCT 40.0 37.9* 42.6  MCV 106.7* 105.0* 104.4*  PLT 188 171 160     Basic Metabolic Panel: Recent Labs  Lab 03/14/2021 0250 02/25/21 0630 02/26/21 0719 02/26/21 1005  NA 136 135 142 142  K 3.5 4.1 3.9 3.8  CL 102 106 107 107  CO2 17* 14* 17* 16*  GLUCOSE 123* 109* 73 103*  BUN 30* 31* 33* 35*  CREATININE 1.27* 1.18 1.18 1.19  CALCIUM 8.6* 8.9 9.1 9.1  MG  --  2.1 2.0  --   PHOS  --  2.0* 3.0  --     GFR: Estimated Creatinine Clearance: 56.1 mL/min (by C-G formula based on SCr of 1.19 mg/dL). Recent Labs  Lab 03/08/2021 0144 02/21/2021 0250 02/19/2021 0455  03/18/2021 0836 02/25/2021 1106 02/25/21 0630 02/26/21 0719  PROCALCITON  --  <0.10  --   --   --   --   --   WBC 5.9  --   --   --   --  6.5 12.5*  LATICACIDVEN 8.6*  --  7.4* 7.2* 6.6*  --   --      Liver Function Tests: Recent Labs  Lab 02/22/2021 0250 02/25/21 0630 02/26/21 0719  AST 67* 108* 221*  ALT 52* 81* 154*  ALKPHOS 74 77 82  BILITOT 2.3* 1.7* 2.2*  PROT 7.3 6.9 7.6  ALBUMIN 2.3* 2.1* 2.2*    Recent Labs  Lab 02/20/2021 0250  LIPASE 30    No results for input(s): AMMONIA in the last 168 hours.  ABG    Component Value Date/Time   HCO3 31.4 (H) 05/26/2020 2024   O2SAT 65.9 05/26/2020 2024      Coagulation Profile: Recent Labs  Lab 03/14/2021 0250  INR 2.3*     Cardiac Enzymes: Recent Labs  Lab 02/22/2021 0250  CKTOTAL 62     HbA1C: No results found for: HGBA1C  CBG: Recent Labs  Lab 02/25/21 1606 02/26/21 0813 02/26/21 0914 02/26/21 1205 02/26/21 1245  GLUCAP 129* 51* 78 34* 125*     Review of Systems:   Unable to obtain due to altered mental  status  Past Medical History  He,  has a past medical history of Atrial flutter ([place]), Coronary artery disease, Hyperlipidemia, Hypertension, and TIA (transient ischemic attack).   Surgical History    Past Surgical History:  Procedure Laterality Date   arm surgery Left    BOWEL RESECTION  05/27/2020   Procedure: SMALL BOWEL RESECTION;  Surgeon: Ronny Bacon, MD;  Location: [facility] ORS;  Service: General;;   HERNIA REPAIR     INGUINAL HERNIA REPAIR Right 05/27/2020   Procedure: HERNIA REPAIR INGUINAL INCARCERATED;  Surgeon: Ronny Bacon, MD;  Location: [facility] ORS;  Service: General;  Laterality: Right;   ORCHIECTOMY Right 05/27/2020   Procedure: ORCHIECTOMY;  Surgeon: Ronny Bacon, MD;  Location: [facility] ORS;  Service: General;  Laterality: Right;     Social History   reports that he has quit smoking. His smokeless tobacco use includes snuff. He reports current alcohol use. He reports  that he does not use drugs.   Family History   His Family history is unknown by patient.   Allergies Allergies  Allergen Reactions   Penicillins      Home Medications  Prior to Admission medications   Medication Sig Start Date End Date Taking? Authorizing Provider  acetaminophen (TYLENOL) 650 MG CR tablet Take 650 mg by mouth every 8 (eight) hours as needed for pain.    [provider]  aspirin EC 81 MG tablet Take 81 mg by mouth daily. Swallow whole.    [provider]  busPIRone (BUSPAR) 5 MG tablet Take 5 mg by mouth 3 (three) times daily.    [provider]  donepezil (ARICEPT) 10 MG tablet Take 10 mg by mouth at bedtime. 12/18/17   [provider]  feeding supplement (ENSURE ENLIVE / ENSURE PLUS) LIQD Take 237 mLs by mouth 3 (three) times daily between meals. 04/26/20   Lorella Nimrod, MD  furosemide (LASIX) 20 MG tablet Take 3 tablets (60 mg total) by mouth daily. 01/07/21   Alma Friendly, MD  losartan (COZAAR) 25 MG tablet Take 1 tablet (25 mg total) by mouth daily. 06/15/20   Lorella Nimrod, MD  metoprolol succinate (TOPROL-XL) 50 MG 24 hr tablet Take 50 mg by mouth daily. Take with or immediately following a meal.    [provider]  Multiple Vitamin (MULTIVITAMIN WITH MINERALS) TABS tablet Take 1 tablet by mouth daily. 04/27/20   Lorella Nimrod, MD  OVER THE COUNTER MEDICATION 1 application. Apply compression stockings in the morning and remove at bedtime    [provider]  pantoprazole (PROTONIX) 40 MG tablet Take 1 tablet (40 mg total) by mouth daily. 06/15/20   Lorella Nimrod, MD  simethicone (MYLICON) 80 MG chewable tablet Chew 80 mg by mouth every 6 (six) hours as needed for flatulence.    [provider]  spironolactone (ALDACTONE) 25 MG tablet Take 1 tablet (25 mg total) by mouth daily. 01/06/21 02/05/21  Alma Friendly, MD  Scheduled Meds:  Chlorhexidine Gluconate Cloth  6 each Topical Q0600    dexamethasone (DECADRON) injection  4 mg Intravenous Q24H   dextrose       feeding supplement  237 mL Oral TID BM   folic acid  1 mg Intravenous Daily   insulin aspart  0-5 Units Subcutaneous QHS   insulin aspart  0-9 Units Subcutaneous TID WC   multivitamin with minerals  1 tablet Oral Daily   thiamine injection  100 mg Intravenous Daily   Continuous Infusions:  sodium chloride  heparin 1,050 Units/hr (02/26/21 1222)   phenylephrine (NEO-SYNEPHRINE) Adult infusion 50 mcg/min (02/26/21 1200)   remdesivir 100 mg in NS 100 mL 100 mg (02/26/21 0922)   PRN Meds:.acetaminophen, chlorpheniramine-HYDROcodone, docusate sodium, guaiFENesin-dextromethorphan, polyethylene glycol  Active Hospital Problem list   Sepsis due to COVID-pneumonia AKI Anion gap metabolic acidosis Elevated LFTs Elevated troponin Acute on chronic congestive heart failure  Assessment & Plan:  Sepsis without septic shock due to suspected COVID-pneumonia CT chest shows Patchy peribronchial and peripheral asymmetric ground-glass opacity in both lungs has a typical appearance of COVID-19 pneumonia. -Supplemental O2 as needed to maintain O2 saturations 88 to 92% -Follow intermittent ABG and chest x-ray as needed -As needed bronchodilators -Stat remdesivir x5 days  -Continue Empiric abx coverage given risk factors (nursing home resident) with vancomycin + cefepime pending cultures, may broaden if appropriate -f/u cultures, trend lactic/ PCT -Check strep pneumo and Legionella -Steroids initiated -Encourage OOB, IS, FV, and awake proning if able -Continue airborne, contact precautions for 21 days from positive testing. -Monitor CMP and inflammatory markers -Consider vasopressors to keep MAP greater than 65   -patietn appears to be more aggitated and showing signs of drug/alcohol withdrawal   Acute on chronic Systolic CHF (last known EF <20%) BNP>4500 Cardiogenic shock -Hypertension -Elevated troponins likely  demand ischemia Hx: CAD status post stents, A-fib, HLD  -Trend troponins -Continuous cardiac monitoring -Maintain MAP greater than 65 -Currently not on any anticoagulation for A. fib, hold antiplatelets and anticoagulation in the setting of elevated PT/INR and nosebleed -IV Lasix as blood pressure and renal function permits; currently on Lasix 20 mg  -Hold losartan, spironolactone and metoprolol in the setting of sepsis and hypotension -Repeat 2D Echocardiogram  Acute kidney injury Likely prerenal in the setting of above -Monitor I&O's / urinary output -Follow BMP -Ensure adequate renal perfusion -Avoid nephrotoxic agents as able -Replace electrolytes as indicated  Anion gap metabolic acidosis Lactic acidosis -Trend lactic acid -Bicarb as needed   Elevated LFTs likely in the setting of above -CT abdomen pelvis cholelithiasis no evidence of cholecystitis -Check hepatitis panel if appropriate -Lipid profile, CK and TSH -Will continue to follow   Acute Metabolic Encephalopathy due to above Underlying history of dementia -CT head negative for acute intracranial abnormality -Provide supportive care -Avoid sedative as able -Hold donepezil due to bradycardia    Best practice:  Diet:  NPO Pain/Anxiety/Delirium protocol (if indicated): No VAP protocol (if indicated): Not indicated DVT prophylaxis: Contraindicated GI prophylaxis: PPI Glucose control:  SSI Yes Central venous access:  N/A Arterial line:  N/A Foley:  Yes, and it is still needed Mobility:  bed rest  PT consulted: N/A Last date of multidisciplinary goals of care discussion [9/8] Code Status:  full code Disposition: ICU   = Goals of Care = Code Status Order: FULL  Primary Emergency Contact: Tull,Jamie Wishes to pursue full aggressive treatment and intervention options, including CPR and intubation, but goals of care will be addressed on going with family if that should become necessary.   Critical care  provider statement:   Total critical care time: 33 minutes   Performed by: Lanney Gins MD   Critical care time was exclusive of separately billable procedures and treating other patients.   Critical care was necessary to treat or prevent imminent or life-threatening deterioration.   Critical care was time spent personally by me on the following activities: development of treatment plan with patient and/or surrogate as well as nursing, discussions with consultants, evaluation of patient's response to treatment,  examination of patient, obtaining history from patient or surrogate, ordering and performing treatments and interventions, ordering and review of laboratory studies, ordering and review of radiographic studies, pulse oximetry and re-evaluation of patient's condition.    Ottie Glazier, M.D.  Pulmonary & Critical Care Medicine

## 2021-02-26 NOTE — Progress Notes (Signed)
ANTICOAGULATION CONSULT NOTE  Pharmacy Consult for heparin Indication: DVT  Allergies  Allergen Reactions   Penicillins     Patient Measurements: Height: '6\' 3"'$  (190.5 cm) Weight: 77.5 kg (170 lb 13.7 oz) IBW/kg (Calculated) : 84.5 Heparin Dosing Weight: 76 kg  Vital Signs: Temp: 96.3 F (35.7 C) (09/10 0800) Temp Source: Rectal (09/10 0800) BP: 113/73 (09/10 0700) Pulse Rate: 72 (09/10 0800)  Labs: Recent Labs    03/16/2021 0144 03/01/2021 0250 02/22/2021 0455 03/10/2021 2023 02/25/21 0630 02/26/21 0719 02/26/21 1006  HGB 12.7*  --   --   --  12.6* 13.4  --   HCT 40.0  --   --   --  37.9* 42.6  --   PLT 188  --   --   --  171 160  --   APTT  --  36  --   --   --   --   --   LABPROT  --  25.2*  --   --   --   --   --   INR  --  2.3*  --   --   --   --   --   HEPARINUNFRC  --   --   --  0.48 0.60  --  0.84*  CREATININE  --  1.27*  --   --  1.18 1.18  --   CKTOTAL  --  62  --   --   --   --   --   TROPONINIHS 51*  --  48*  --   --   --   --      Estimated Creatinine Clearance: 56.6 mL/min (by C-G formula based on SCr of 1.18 mg/dL).   Medical History: Past Medical History:  Diagnosis Date   Atrial flutter ([place])    Coronary artery disease    Hyperlipidemia    Hypertension    TIA (transient ischemic attack)      Assessment: 80 year old male admitted after a fall at facility. History of atrial flutter, no anticoagulation at baseline. Patient found to be Covid positive and started on remdesivir and steroids. Ultrasound lower extremities positive symmetric bilateral acute, nonocclusive DVT involving the bilateral profunda femoral and bilateral posterior tibial and peroneal veins.  INR elevated at 2.3 on admission.  9/8 2023  HL 0.48 9/9 0630  HL 0.60 2100 units/hr 9/10 1006 HL 0.84    Goal of Therapy:  Heparin level 0.3-0.7 units/ml Monitor platelets by anticoagulation protocol: Yes   Plan:  --9/10 1006 HL 0.84 supratherapeutic --Will adjust heparin drip  from 1200 units/hr to 1050 units/hr --Check HL in 8 hrs --CBC daily while on heparin  Noralee Space, PharmD Clinical Pharmacist 02/26/2021 11:22 AM

## 2021-02-27 LAB — GLUCOSE, CAPILLARY
Glucose-Capillary: 102 mg/dL — ABNORMAL HIGH (ref 70–99)
Glucose-Capillary: 118 mg/dL — ABNORMAL HIGH (ref 70–99)
Glucose-Capillary: 122 mg/dL — ABNORMAL HIGH (ref 70–99)
Glucose-Capillary: 82 mg/dL (ref 70–99)
Glucose-Capillary: 90 mg/dL (ref 70–99)

## 2021-02-27 LAB — COMPREHENSIVE METABOLIC PANEL
ALT: 510 U/L — ABNORMAL HIGH (ref 0–44)
AST: 950 U/L — ABNORMAL HIGH (ref 15–41)
Albumin: 2.3 g/dL — ABNORMAL LOW (ref 3.5–5.0)
Alkaline Phosphatase: 84 U/L (ref 38–126)
Anion gap: 18 — ABNORMAL HIGH (ref 5–15)
BUN: 36 mg/dL — ABNORMAL HIGH (ref 8–23)
CO2: 14 mmol/L — ABNORMAL LOW (ref 22–32)
Calcium: 9 mg/dL (ref 8.9–10.3)
Chloride: 109 mmol/L (ref 98–111)
Creatinine, Ser: 1.04 mg/dL (ref 0.61–1.24)
GFR, Estimated: >60 mL/min (ref >60)
Glucose, Bld: 93 mg/dL (ref 70–99)
Potassium: 3.9 mmol/L (ref 3.5–5.1)
Sodium: 141 mmol/L (ref 135–145)
Total Bilirubin: 2.8 mg/dL — ABNORMAL HIGH (ref 0.3–1.2)
Total Protein: 7.5 g/dL (ref 6.5–8.1)

## 2021-02-27 LAB — CBC WITH DIFFERENTIAL/PLATELET
Abs Immature Granulocytes: 0.06 10*3/uL (ref 0.00–0.07)
Basophils Absolute: 0 10*3/uL (ref 0.0–0.1)
Basophils Relative: 0 %
Eosinophils Absolute: 0 10*3/uL (ref 0.0–0.5)
Eosinophils Relative: 0 %
HCT: 41.9 % (ref 39.0–52.0)
Hemoglobin: 13 g/dL (ref 13.0–17.0)
Immature Granulocytes: 1 %
Lymphocytes Relative: 0 %
Lymphs Abs: 0 10*3/uL — ABNORMAL LOW (ref 0.7–4.0)
MCH: 33.9 pg (ref 26.0–34.0)
MCHC: 31 g/dL (ref 30.0–36.0)
MCV: 109.1 fL — ABNORMAL HIGH (ref 80.0–100.0)
Monocytes Absolute: 0.3 10*3/uL (ref 0.1–1.0)
Monocytes Relative: 3 %
Neutro Abs: 8.1 10*3/uL — ABNORMAL HIGH (ref 1.7–7.7)
Neutrophils Relative %: 96 %
Platelets: 161 10*3/uL (ref 150–400)
RBC: 3.84 MIL/uL — ABNORMAL LOW (ref 4.22–5.81)
RDW: 21.1 % — ABNORMAL HIGH (ref 11.5–15.5)
Smear Review: NORMAL
WBC: 8.5 10*3/uL (ref 4.0–10.5)
nRBC: 9.4 % — ABNORMAL HIGH (ref 0.0–0.2)

## 2021-02-27 LAB — C-REACTIVE PROTEIN: CRP: 4.9 mg/dL — ABNORMAL HIGH (ref <1.0)

## 2021-02-27 LAB — HEMOGLOBIN A1C
Hgb A1c MFr Bld: 6.6 % — ABNORMAL HIGH (ref 4.8–5.6)
Mean Plasma Glucose: 142.72 mg/dL

## 2021-02-27 LAB — MAGNESIUM: Magnesium: 2.1 mg/dL (ref 1.7–2.4)

## 2021-02-27 LAB — PHOSPHORUS: Phosphorus: 3.3 mg/dL (ref 2.5–4.6)

## 2021-02-27 LAB — D-DIMER, QUANTITATIVE: D-Dimer, Quant: 3.04 ug/mL-FEU — ABNORMAL HIGH (ref 0.00–0.50)

## 2021-02-27 LAB — FERRITIN: Ferritin: 1615 ng/mL — ABNORMAL HIGH (ref 24–336)

## 2021-02-27 LAB — HEPARIN LEVEL (UNFRACTIONATED): Heparin Unfractionated: 0.35 IU/mL (ref 0.30–0.70)

## 2021-02-27 MED ORDER — METOPROLOL TARTRATE 5 MG/5ML IV SOLN
5.0000 mg | Freq: Four times a day (QID) | INTRAVENOUS | Status: DC | PRN
  Administered 2021-02-27 – 2021-02-28 (×2): 5 mg via INTRAVENOUS
  Filled 2021-02-27: qty 5

## 2021-02-27 MED ORDER — HEPARIN SODIUM (PORCINE) 5000 UNIT/ML IJ SOLN: 5000.0000 [IU] | Freq: Three times a day (TID) | INTRAMUSCULAR | Status: DC

## 2021-02-27 MED ORDER — ENOXAPARIN SODIUM 100 MG/ML IJ SOSY
1.0000 mg/kg | PREFILLED_SYRINGE | Freq: Two times a day (BID) | INTRAMUSCULAR | Status: DC
  Administered 2021-02-27 – 2021-03-01 (×4): 82.5 mg via SUBCUTANEOUS
  Filled 2021-02-27 (×7): qty 0.82

## 2021-02-27 MED ORDER — INSULIN ASPART 100 UNIT/ML IJ SOLN
0.0000 [IU] | INTRAMUSCULAR | Status: DC
  Administered 2021-02-28 (×2): 1 [IU] via SUBCUTANEOUS
  Filled 2021-02-27 (×2): qty 1

## 2021-02-27 NOTE — Progress Notes (Signed)
PHARMACY CONSULT NOTE - FOLLOW UP  Pharmacy Consult for Electrolyte Monitoring and Replacement   Recent Labs: Potassium (mmol/L)  Date Value  02/27/2021 3.9   Magnesium (mg/dL)  Date Value  02/27/2021 2.1   Calcium (mg/dL)  Date Value  02/27/2021 9.0   Albumin (g/dL)  Date Value  02/27/2021 2.3 (L)   Phosphorus (mg/dL)  Date Value  02/27/2021 3.3   Sodium (mmol/L)  Date Value  02/27/2021 141     Assessment: 80 year old male admitted after a fall at facility. History of atrial flutter, no anticoagulation at baseline. Patient found to be Covid positive and started on remdesivir and steroids. Also found to have DVT bilateral lower extremities. Pharmacy consult for electrolyte management.  Goal of Therapy:  Electrolytes WNL  Plan:  --no replacement at this time --f/u electrolytes with morning labs  Noralee Space, PharmD Clinical Pharmacist 02/27/2021 11:19 AM

## 2021-02-27 NOTE — Progress Notes (Signed)
ANTICOAGULATION CONSULT NOTE  Pharmacy Consult for heparin Indication: DVT  Allergies  Allergen Reactions   Penicillins     Patient Measurements: Height: '6\' 3"'$  (190.5 cm) Weight: 83.4 kg (183 lb 13.8 oz) IBW/kg (Calculated) : 84.5 Heparin Dosing Weight: 76 kg  Vital Signs: Temp: 96.6 F (35.9 C) (09/11 0600) Temp Source: Rectal (09/11 0400) BP: 118/62 (09/11 0600) Pulse Rate: 106 (09/10 2300)  Labs: Recent Labs    02/25/21 0630 02/26/21 0719 02/26/21 1005 02/26/21 1006 02/26/21 1435 02/26/21 2108 02/27/21 0700  HGB 12.6* 13.4  --   --   --   --  13.0  HCT 37.9* 42.6  --   --   --   --  41.9  PLT 171 160  --   --   --   --  161  HEPARINUNFRC 0.60  --   --  0.84*  --  0.47 0.35  CREATININE 1.18 1.18 1.19  --  1.21  --   --      Estimated Creatinine Clearance: 59.4 mL/min (by C-G formula based on SCr of 1.21 mg/dL).   Medical History: Past Medical History:  Diagnosis Date   Atrial flutter ([place])    Coronary artery disease    Hyperlipidemia    Hypertension    TIA (transient ischemic attack)      Assessment: 80 year old male admitted after a fall at facility. History of atrial flutter, no anticoagulation at baseline. Patient found to be Covid positive and started on remdesivir and steroids. Ultrasound lower extremities positive symmetric bilateral acute, nonocclusive DVT involving the bilateral profunda femoral and bilateral posterior tibial and peroneal veins.  INR elevated at 2.3 on admission.  Date Time HL Rate/comment 9/8  2023 0.48 9/9  0630 0.60 1200 units/hr 9/10  1006 0.84 1200 units/hr 9/10 2108 0.47 1050 units/hr 9/11 0700 0.35   Goal of Therapy:  Heparin level 0.3-0.7 units/ml Monitor platelets by anticoagulation protocol: Yes   Plan:  --9/11 0700 HL:0.35    therapeutic --Continue heparin infusion at 1050 units/hr --Check HL with am labs --CBC daily while on heparin  Noralee Space, PharmD Clinical Pharmacist 02/27/2021 9:02  AM

## 2021-02-27 NOTE — Progress Notes (Signed)
Interesting day. Patient non interactive all day. Withdraws to pain. Unable to swallow crushed medications so changed to NPO status. Voided in small amounts incontinently all day. Likes to cram head into left side rail. Heart rate from 135 down to 100 s at times all day. Remains on room air. No family all day.Small smear of BM noted today.Warming blanket on and off all day. No breakdown noted when bath given this morning.

## 2021-02-28 DIAGNOSIS — I509 Heart failure, unspecified: Secondary | ICD-10-CM | POA: Diagnosis not present

## 2021-02-28 DIAGNOSIS — Z7189 Other specified counseling: Secondary | ICD-10-CM | POA: Diagnosis not present

## 2021-02-28 DIAGNOSIS — Z515 Encounter for palliative care: Secondary | ICD-10-CM | POA: Diagnosis not present

## 2021-02-28 DIAGNOSIS — U071 COVID-19: Secondary | ICD-10-CM | POA: Diagnosis not present

## 2021-02-28 LAB — CBC WITH DIFFERENTIAL/PLATELET
Abs Immature Granulocytes: 0.04 10*3/uL (ref 0.00–0.07)
Basophils Absolute: 0 10*3/uL (ref 0.0–0.1)
Basophils Relative: 0 %
Eosinophils Absolute: 0 10*3/uL (ref 0.0–0.5)
Eosinophils Relative: 0 %
HCT: 43.3 % (ref 39.0–52.0)
Hemoglobin: 13.8 g/dL (ref 13.0–17.0)
Immature Granulocytes: 1 %
Lymphocytes Relative: 0 %
Lymphs Abs: 0 10*3/uL — ABNORMAL LOW (ref 0.7–4.0)
MCH: 34 pg (ref 26.0–34.0)
MCHC: 31.9 g/dL (ref 30.0–36.0)
MCV: 106.7 fL — ABNORMAL HIGH (ref 80.0–100.0)
Monocytes Absolute: 0.2 10*3/uL (ref 0.1–1.0)
Monocytes Relative: 3 %
Neutro Abs: 7.8 10*3/uL — ABNORMAL HIGH (ref 1.7–7.7)
Neutrophils Relative %: 96 %
Platelets: 154 10*3/uL (ref 150–400)
RBC: 4.06 MIL/uL — ABNORMAL LOW (ref 4.22–5.81)
RDW: 21.2 % — ABNORMAL HIGH (ref 11.5–15.5)
Smear Review: NORMAL
WBC: 8.1 10*3/uL (ref 4.0–10.5)
nRBC: 13.3 % — ABNORMAL HIGH (ref 0.0–0.2)

## 2021-02-28 LAB — COMPREHENSIVE METABOLIC PANEL
ALT: 758 U/L — ABNORMAL HIGH (ref 0–44)
AST: 1058 U/L — ABNORMAL HIGH (ref 15–41)
Albumin: 2.5 g/dL — ABNORMAL LOW (ref 3.5–5.0)
Alkaline Phosphatase: 92 U/L (ref 38–126)
Anion gap: 16 — ABNORMAL HIGH (ref 5–15)
BUN: 46 mg/dL — ABNORMAL HIGH (ref 8–23)
CO2: 21 mmol/L — ABNORMAL LOW (ref 22–32)
Calcium: 9.3 mg/dL (ref 8.9–10.3)
Chloride: 107 mmol/L (ref 98–111)
Creatinine, Ser: 1.34 mg/dL — ABNORMAL HIGH (ref 0.61–1.24)
GFR, Estimated: 54 mL/min — ABNORMAL LOW (ref >60)
Glucose, Bld: 120 mg/dL — ABNORMAL HIGH (ref 70–99)
Potassium: 4.1 mmol/L (ref 3.5–5.1)
Sodium: 144 mmol/L (ref 135–145)
Total Bilirubin: 3.1 mg/dL — ABNORMAL HIGH (ref 0.3–1.2)
Total Protein: 8 g/dL (ref 6.5–8.1)

## 2021-02-28 LAB — GLUCOSE, CAPILLARY
Glucose-Capillary: 114 mg/dL — ABNORMAL HIGH (ref 70–99)
Glucose-Capillary: 120 mg/dL — ABNORMAL HIGH (ref 70–99)
Glucose-Capillary: 122 mg/dL — ABNORMAL HIGH (ref 70–99)
Glucose-Capillary: 137 mg/dL — ABNORMAL HIGH (ref 70–99)
Glucose-Capillary: 87 mg/dL (ref 70–99)
Glucose-Capillary: 94 mg/dL (ref 70–99)
Glucose-Capillary: 99 mg/dL (ref 70–99)

## 2021-02-28 LAB — D-DIMER, QUANTITATIVE: D-Dimer, Quant: 3.52 ug/mL-FEU — ABNORMAL HIGH (ref 0.00–0.50)

## 2021-02-28 LAB — C-REACTIVE PROTEIN: CRP: 5.2 mg/dL — ABNORMAL HIGH (ref <1.0)

## 2021-02-28 LAB — MAGNESIUM: Magnesium: 2.5 mg/dL — ABNORMAL HIGH (ref 1.7–2.4)

## 2021-02-28 LAB — FERRITIN: Ferritin: 1364 ng/mL — ABNORMAL HIGH (ref 24–336)

## 2021-02-28 LAB — PHOSPHORUS: Phosphorus: 2.6 mg/dL (ref 2.5–4.6)

## 2021-02-28 MED ORDER — CHLORHEXIDINE GLUCONATE 0.12% ORAL RINSE (MEDLINE KIT)
15.0000 mL | Freq: Two times a day (BID) | OROMUCOSAL | Status: DC
  Administered 2021-02-28 – 2021-03-01 (×3): 15 mL via OROMUCOSAL

## 2021-02-28 MED ORDER — ORAL CARE MOUTH RINSE
15.0000 mL | OROMUCOSAL | Status: DC
  Administered 2021-02-28 – 2021-03-02 (×10): 15 mL via OROMUCOSAL

## 2021-02-28 MED ORDER — MORPHINE SULFATE (PF) 2 MG/ML IV SOLN
2.0000 mg | INTRAVENOUS | Status: DC | PRN
  Administered 2021-02-28 – 2021-03-01 (×3): 2 mg via INTRAVENOUS
  Filled 2021-02-28 (×3): qty 1

## 2021-02-28 NOTE — Progress Notes (Signed)
Called Daughter Eaden Craddick [phone]. Gave update patient being moved to MedSurg room 114. Update on status given also, patient unchanged/not responding. I notified nurse Tamika, RN the daughter would like to do a video chat tonight. Erling Conte, RN

## 2021-02-28 NOTE — Care Management (Signed)
patient is COVID 19 pneumonia and septic shock, resolving-off pressors, not on oxygen, demented/confused, DNR/DNI, palliative care team following  [facility] will pick up from 03/01/2021

## 2021-02-28 NOTE — Progress Notes (Signed)
NAME:  Craig [name], MRN:  [id number], DOB:  08-May-1941, LOS: 4 ADMISSION DATE:  02/22/2021, CONSULTATION DATE: 03/18/2021 REFERRING MD: Michel Harrow, DO,   CHIEF COMPLAINT: Unwitnessed fall   HPI  80 y.o with significant PMH as below who presented to the ED from [facility] with unwitnessed fall.  Patient has history of dementia and poor historian.  History obtained from patient's chart.  Per EMS run sheet and ED notes, patient was found down after sustaining unwitnessed fall with injuries noted in the nasal bridge, and right nare, swelling to bilateral lower extremities and left knee.  Per staff at the facility, patient had been complaining of left knee pain prior to fall.  He was also noted with bleeding from his nose which has since subsided.  He is confused at baseline due to history of dementia.  ED Course: On arrival to the ED, he was hypothermic temp of(!) 94.8 F (34.9 C) with blood pressure 92/86 mm Hg and pulse rate (!) 110 beats/min, respiration 14 and oxygen saturation 98% on room air. There were no focal neurological deficits; he was alert and oriented to self only.  Initial pertinent labs/Diagnostics WBC/Hgb/Hct/Plts:  8.1/13.8/43.3/154 (09/12 0610)  CO2 17, glucose 123, BUN/creatinine 30/1.27, calcium 8.6, AST/ALT 67/52 total bilirubin 2.3, anion gap 17 UA: Pyuria there was no evidence of UTI.  Urine culture pending Lactate: 8.6>>7.4 Troponin: 51 PT/INR: 25.2/2.3 BNP > 4500 SARS Coronavirus 2 by RT PCR POSITIVE Abnormal  EKG: normal EKG, normal sinus rhythm, unchanged from previous tracings, ST depression in lateral leads. Imaging: CT head negative for acute intracranial abnormality however CT maxillofacial and cervical spine showed posttraumatic nasal septal deviation but no nasal fracture as well as mild L1 compression fracture without retropulsion.  Given initial concerns for sepsis, septic work-up was initiated and patient received IV fluids and  started on broad broad spectrum antibiotics.  Pro-Cal came back normal however BNP was markedly elevated concerning for cardiogenic in nature.  Bedside ultrasound was performed by EDP which revealed very poor cardiac squeeze.  Patient received IV Lasix.  PCCM consulted for admission to ICU for further work-up and management.  02/26/21- patient continues to have aggitated hyperactive delerium responsive to IV ativan, he also had recurrent hypoglecymic episodes.  Patient with poor prognosis.   9/11 DNR/DNI status  Past Medical History  Acute on chronic HFrEF Atrial fibrillation SBO secondary to incarcerated right inguinal hernia and right spermatic cord abscess s/p right inguinal hernia repair, small bowel resection, and right orchiectomy-05/27/20 Dementia with behavioral disturbances Bradycardia CAD Hyperlipidemia Hypertension TIA  Significant Hospital Events   9/8: Admitted to ICU with sepsis secondary to COVID-pneumonia and traumatic fall  Consults:  PCCM  Procedures:  None  Significant Diagnostic Tests:  9/8: Chest Xray>mild interstitial edema 9/8: Noncontrast CT head> no acute intracranial abnormality 9/8: CTA Chest, abdomen and pelvis>Patchy peribronchial and peripheral asymmetric ground-glass opacity in both lungs has a typical appearance of COVID-19 pneumonia.  Micro Data:  9/8: SARS-CoV-2 PCR> positive 9/8: Influenza PCR> negative 9/8: Blood culture x2> 9/8: Urine Culture> 9/80: MRSA PCR>>  9/8: Strep pneumo urinary antigen> 9/8: Legionella urinary antigen> 9/8: Mycoplasma pneumonia>  Antimicrobials:  Vancomycin 9/8>> Cefepime 9/8>>  OBJECTIVE  Blood pressure 122/87, pulse 80, temperature (!) 96.9 F (36.1 C), temperature source Axillary, resp. rate 20, height '6\' 3"'$  (1.905 m), weight 83.4 kg, SpO2 94 %.        Intake/Output Summary (Last 24 hours) at 02/28/2021 [id number] Last data filed at 02/28/2021  0800 Gross per 24 hour  Intake --  Output 255 ml  Net -255 ml     Filed Weights   02/25/21 0500 02/26/21 0446 02/27/21 0500  Weight: 74.6 kg 77.5 kg 83.4 kg    Physical Examination:   General Appearance: No distress, minimal oxygen EYES PERRLA, EOM intact.   NECK Supple, No JVD Pulmonary: normal breath sounds,  CardiovascularNormal S1,S2.  No m/r/g.   Abdomen: Benign, Soft, non-tender. Skin:   warm, no rashes, no ecchymosis  Extremities: normal, no cyanosis, clubbing. Neuro:without focal findings,  lethargic PSYCHIATRIC: lethargic   ALL OTHER ROS ARE NEGATIVE   Labs/imaging that I havepersonally reviewed  (right click and "Reselect all SmartList Selections" daily)     Labs   CBC: Recent Labs  Lab 03/16/2021 0144 02/25/21 0630 02/26/21 0719 02/27/21 0700 02/28/21 0610  WBC 5.9 6.5 12.5* 8.5 8.1  NEUTROABS 5.0 5.8 11.7* 8.1* 7.8*  HGB 12.7* 12.6* 13.4 13.0 13.8  HCT 40.0 37.9* 42.6 41.9 43.3  MCV 106.7* 105.0* 104.4* 109.1* 106.7*  PLT 188 171 160 161 154     Basic Metabolic Panel: Recent Labs  Lab 02/25/21 0630 02/26/21 0719 02/26/21 1005 02/26/21 1435 02/27/21 0700 02/28/21 0610  NA 135 142 142 141 141 144  K 4.1 3.9 3.8 4.4 3.9 4.1  CL 106 107 107 107 109 107  CO2 14* 17* 16* 17* 14* 21*  GLUCOSE 109* 73 103* 129* 93 120*  BUN 31* 33* 35* 35* 36* 46*  CREATININE 1.18 1.18 1.19 1.21 1.04 1.34*  CALCIUM 8.9 9.1 9.1 8.9 9.0 9.3  MG 2.1 2.0  --   --  2.1 2.5*  PHOS 2.0* 3.0  --   --  3.3 2.6    GFR: Estimated Creatinine Clearance: 53.6 mL/min (A) (by C-G formula based on SCr of 1.34 mg/dL (H)). Recent Labs  Lab 03/14/2021 0144 02/28/2021 0250 02/23/2021 0455 03/11/2021 0836 02/23/2021 1106 02/25/21 0630 02/26/21 0719 02/27/21 0700 02/28/21 0610  PROCALCITON  --  <0.10  --   --   --   --   --   --   --   WBC 5.9  --   --   --   --  6.5 12.5* 8.5 8.1  LATICACIDVEN 8.6*  --  7.4* 7.2* 6.6*  --   --   --   --      Liver Function Tests: Recent Labs  Lab 02/23/2021 0250 02/25/21 0630 02/26/21 0719  02/27/21 0700 02/28/21 0610  AST 67* 108* 221* 950* 1,058*  ALT 52* 81* 154* 510* 758*  ALKPHOS 74 77 82 84 92  BILITOT 2.3* 1.7* 2.2* 2.8* 3.1*  PROT 7.3 6.9 7.6 7.5 8.0  ALBUMIN 2.3* 2.1* 2.2* 2.3* 2.5*    Recent Labs  Lab 02/25/2021 0250  LIPASE 30    No results for input(s): AMMONIA in the last 168 hours.  ABG    Component Value Date/Time   HCO3 31.4 (H) 05/26/2020 2024   O2SAT 65.9 05/26/2020 2024      Coagulation Profile: Recent Labs  Lab 02/28/2021 0250  INR 2.3*     Cardiac Enzymes: Recent Labs  Lab 02/17/2021 0250  CKTOTAL 62     HbA1C: Hgb A1c MFr Bld  Date/Time Value Ref Range Status  02/27/2021 07:00 AM 6.6 (H) 4.8 - 5.6 % Final    Comment:    (NOTE) Pre diabetes:          5.7%-6.4%  Diabetes:              >  6.4%  Glycemic control for   <7.0% adults with diabetes     CBG: Recent Labs  Lab 02/27/21 1229 02/27/21 1559 02/27/21 2206 02/28/21 0030 02/28/21 0745  GLUCAP 122* 118* 120* 137* 114*    ROS limited due to lethargy   Past Medical History  He,  has a past medical history of Atrial flutter ([place]), Coronary artery disease, Hyperlipidemia, Hypertension, and TIA (transient ischemic attack).   Surgical History    Past Surgical History:  Procedure Laterality Date   arm surgery Left    BOWEL RESECTION  05/27/2020   Procedure: SMALL BOWEL RESECTION;  Surgeon: Ronny Bacon, MD;  Location: [facility] ORS;  Service: General;;   HERNIA REPAIR     INGUINAL HERNIA REPAIR Right 05/27/2020   Procedure: HERNIA REPAIR INGUINAL INCARCERATED;  Surgeon: Ronny Bacon, MD;  Location: [facility] ORS;  Service: General;  Laterality: Right;   ORCHIECTOMY Right 05/27/2020   Procedure: ORCHIECTOMY;  Surgeon: Ronny Bacon, MD;  Location: [facility] ORS;  Service: General;  Laterality: Right;      Active Hospital Problem list   Sepsis due to COVID-pneumonia AKI Anion gap metabolic acidosis Elevated LFTs Elevated troponin Acute on chronic congestive  heart failure  Assessment & Plan:  Sepsis without septic shock due to suspected COVID-pneumonia CT chest shows Patchy peribronchial and peripheral asymmetric ground-glass opacity in both lungs has a typical appearance of COVID-19 pneumonia. Oxygen as needed remdesivir x5 days  -Continue Empiric abx coverage given risk factors (nursing home resident) with vancomycin + cefepime pending cultures, may broaden if appropriate -Steroids initiated   -patietn appears to be more aggitated and showing signs of drug/alcohol withdrawal   Acute on chronic Systolic CHF (last known EF <20%) BNP>4500 Cardiogenic shock -Hypertension -Elevated troponins likely demand ischemia Hx: CAD status post stents, A-fib, HLD  Off pressors Lasix as tolerated   ACUTE KIDNEY INJURY/Renal Failure -continue Foley Catheter-assess need -Avoid nephrotoxic agents -Follow urine output, BMP -Ensure adequate renal perfusion, optimize oxygenation -Renal dose medications   Intake/Output Summary (Last 24 hours) at 02/28/2021 0931 Last data filed at 02/28/2021 0800 Gross per 24 hour  Intake --  Output 255 ml  Net -255 ml       Acute Metabolic Encephalopathy due to above Underlying history of dementia +COVID Avoid sedatives   Best practice:  Diet:  NPO Pain/Anxiety/Delirium protocol (if indicated): No VAP protocol (if indicated): Not indicated DVT prophylaxis: Contraindicated GI prophylaxis: PPI Glucose control:  SSI Yes Central venous access:  N/A Arterial line:  N/A Foley:  Yes, and it is still needed Mobility:  bed rest  PT consulted: N/A Last date of multidisciplinary goals of care discussion [9/8] Code Status:  full code Disposition: SD status   Overall prognosis is poor, palliative care team following [facility] transfer to [facility], ICU needs resolvinging  Corrin Parker, M.D.  [facility]  Medical Director [place] Director [place]  Department

## 2021-02-28 NOTE — Progress Notes (Addendum)
Patient is  not responding to any stimulation except deep pain -a grimace. Does not withdraw to pain. Does not open eyes except to deep pain. Grimaces to mouth care. Patients level of consciousness or lack there of reported to Dr. Mortimer Fries. CBG 99. Oxygen per nasal canula at 2 liters.

## 2021-02-28 NOTE — Progress Notes (Signed)
PHARMACY CONSULT NOTE - FOLLOW UP  Pharmacy Consult for Electrolyte Monitoring and Replacement   Recent Labs: Potassium (mmol/L)  Date Value  02/28/2021 4.1   Magnesium (mg/dL)  Date Value  02/28/2021 2.5 (H)   Calcium (mg/dL)  Date Value  02/28/2021 9.3   Albumin (g/dL)  Date Value  02/28/2021 2.5 (L)   Phosphorus (mg/dL)  Date Value  02/28/2021 2.6   Sodium (mmol/L)  Date Value  02/28/2021 144     Assessment: 80 year old male admitted after a fall at facility. History of atrial flutter, no anticoagulation at baseline. Patient found to be Covid positive and started on remdesivir and steroids. Also found to have DVT bilateral lower extremities. Pharmacy consult for electrolyte management.  9/12: no MIVF; transaminases elevated (stopped remdes day 4 of 5)  Labs: Scr 1.18>1.21>1.34 Mg 2.1>2.5 Phos: 3.0>2.6  Goal of Therapy:  Electrolytes WNL  Plan:  --no replacement at this time.  --f/u electrolytes with morning labs  Lorna Dibble, PharmD Clinical Pharmacist 02/28/2021 8:21 AM

## 2021-02-28 NOTE — Progress Notes (Addendum)
Daily Progress Note   Patient Name: Craig [name]       Date: 02/28/2021 DOB: [birth year]  Age: 80 y.o. MRN#: [id number] Attending Physician: Flora Lipps, MD Primary Care Physician: Nona Dell, Corene Cornea, MD Admit Date: 02/27/2021  Reason for Consultation/Follow-up: Establishing goals of care  Subjective: Patient is in bed, and appears restless. Was able to speak with daughter.  We discussed his diagnosis, poor prognosis, GOC, EOL wishes disposition and options.  Created space and opportunity for patient  to explore thoughts and feelings regarding current medical information.   A detailed discussion was had today regarding advanced directives.  Concepts specific to code status, artifical feeding and hydration, IV antibiotics and rehospitalization were discussed.  The difference between an aggressive medical intervention path and a comfort care path was discussed.  Values and goals of care important to patient and family were attempted to be elicited.  Discussed limitations of medical interventions to prolong quality of life in some situations and discussed the concept of human mortality. Discussed in detail what an acceptable QOL would be for him, and that QOL has not, is not, and will not be acceptable based on the level of independence and dignity that he would be accepting of. She states also, he would not want to live in a facility.   She states he was declining before this hospitalization. She states he was not really eating or drinking. She states she loves her father and does not want him to suffer. She is in agreement with comfort focused care and understands he may die during this hospitalization. She will speak with her brother Berneta Sages and call the ICU to advise change to comfort care.    Called son Berneta Sages- he states he will speak with sister and understands plans to call the ICU to let them know plans for shifting to comfort when they are ready.  They are both aware they can visit if they would like. They are both aware he may die prior to hospice facility placement.   Length of Stay: 4  Current Medications: Scheduled Meds:  . Chlorhexidine Gluconate Cloth  6 each Topical Q0600  . enoxaparin (LOVENOX) injection  1 mg/kg Subcutaneous Q12H  . folic acid  1 mg Intravenous Daily  . insulin aspart  0-9 Units Subcutaneous Q4H  . thiamine injection  100 mg Intravenous Daily    Continuous Infusions: . sodium chloride      PRN Meds: LORazepam, metoprolol tartrate  Physical Exam Constitutional:      Comments: Restless.             Vital Signs: BP (!) 126/93   Pulse 80   Temp (!) 96.8 F (36 C)   Resp (!) 26   Ht '6\' 3"'$  (1.905 m)   Wt 83.4 kg   SpO2 98%   BMI 22.98 kg/m  SpO2: SpO2: 98 % O2 Device: O2 Device: Nasal Cannula O2 Flow Rate: O2 Flow Rate (L/min): 2 L/min  Intake/output summary:  Intake/Output Summary (Last 24 hours) at 02/28/2021 1217 Last data filed at 02/28/2021 1200 Gross per 24 hour  Intake --  Output 360 ml  Net -360 ml   LBM: Last BM Date:  (PTA) Baseline Weight: Weight: 79.6 kg Most recent weight: Weight: 83.4 kg        Patient Active Problem List   Diagnosis Date Noted  . Sepsis due to pneumonia ([place]) 03/16/2021  . Acute on chronic heart failure ([place]) 12/24/2020  . Acute on chronic HFrEF (heart failure with reduced ejection fraction) ([place]) 12/23/2020  . Acute respiratory failure with hypoxia ([place]) 12/23/2020  . HFrEF (heart failure with reduced ejection fraction) ([place])   . Atrial flutter with rapid ventricular response ([place]) 05/26/2020  . SBO (small bowel obstruction) ([place]) 05/26/2020  . Hypernatremia 05/26/2020  . Nicotine dependence 05/26/2020  . Protein-calorie malnutrition, severe 04/22/2020  . Dementia with behavioral  disturbance ([place]) 04/21/2020  . Altered mental status 04/20/2020  . Atrial flutter ([place])   . Bradycardia   . CAD (coronary artery disease), native coronary artery 12/25/2017  . History of coronary artery stent placement 12/25/2017  . Hyperlipidemia 12/25/2017  . Former smoker 12/25/2017  . Essential hypertension 12/25/2017  . Memory loss 12/25/2017    Palliative Care Assessment & Plan     Recommendations/Plan: Family will call ICU to tell them when to shift to comfort care after they have spoken together.     Code Status:    Code Status Orders  (From admission, onward)           Start     Ordered   02/26/21 1706  Do not attempt resuscitation (DNR)  Continuous       Question Answer Comment  In the event of cardiac or respiratory ARREST Do not call a "code blue"   In the event of cardiac or respiratory ARREST Do not perform Intubation, CPR, defibrillation or ACLS   In the event of cardiac or respiratory ARREST Use medication by any route, position, wound care, and other measures to relive pain and suffering. May use oxygen, suction and manual treatment of airway obstruction as needed for comfort.   Comments per daughter Roselyn Reef  [place]      02/26/21 1705           Code Status History     Date Active Date Inactive Code Status Order ID Comments User Context   03/09/2021 0555 02/26/2021 1705 Full Code [id number]  Lang Snow, NP ED   12/23/2020 0212 01/06/2021 2324 Full Code [id number]  Etta Quill, DO ED   05/27/2020 0043 06/15/2020 2314 Full Code [id number]  Athena Masse, MD ED   04/20/2020 2040 04/26/2020 1851 Full Code [id number]  Lenore Cordia, MD ED       Prognosis:  < 2 weeks    Care plan was  discussed with CCM  Thank you for allowing the Palliative Medicine Team to assist in the care of this patient.   Time In: 11:10 Time Out: 12:20 Total Time 70 min Prolonged Time Billed  no       Greater than 50%  of this time was spent counseling and  coordinating care related to the above assessment and plan.  Asencion Gowda, NP  Please contact Palliative Medicine Team phone at [phone] for questions and concerns.

## 2021-03-01 DIAGNOSIS — Z515 Encounter for palliative care: Secondary | ICD-10-CM | POA: Diagnosis not present

## 2021-03-01 DIAGNOSIS — Z7189 Other specified counseling: Secondary | ICD-10-CM | POA: Diagnosis not present

## 2021-03-01 LAB — CULTURE, BLOOD (ROUTINE X 2)
Culture: NO GROWTH
Culture: NO GROWTH
Special Requests: ADEQUATE

## 2021-03-01 LAB — COMPREHENSIVE METABOLIC PANEL
ALT: 779 U/L — ABNORMAL HIGH (ref 0–44)
AST: 956 U/L — ABNORMAL HIGH (ref 15–41)
Albumin: 2.5 g/dL — ABNORMAL LOW (ref 3.5–5.0)
Alkaline Phosphatase: 90 U/L (ref 38–126)
Anion gap: 14 (ref 5–15)
BUN: 53 mg/dL — ABNORMAL HIGH (ref 8–23)
CO2: 21 mmol/L — ABNORMAL LOW (ref 22–32)
Calcium: 9.6 mg/dL (ref 8.9–10.3)
Chloride: 109 mmol/L (ref 98–111)
Creatinine, Ser: 1.2 mg/dL (ref 0.61–1.24)
GFR, Estimated: >60 mL/min (ref >60)
Glucose, Bld: 85 mg/dL (ref 70–99)
Potassium: 4 mmol/L (ref 3.5–5.1)
Sodium: 144 mmol/L (ref 135–145)
Total Bilirubin: 3.8 mg/dL — ABNORMAL HIGH (ref 0.3–1.2)
Total Protein: 8 g/dL (ref 6.5–8.1)

## 2021-03-01 LAB — CBC WITH DIFFERENTIAL/PLATELET
Abs Immature Granulocytes: 0.02 10*3/uL (ref 0.00–0.07)
Basophils Absolute: 0 10*3/uL (ref 0.0–0.1)
Basophils Relative: 0 %
Eosinophils Absolute: 0 10*3/uL (ref 0.0–0.5)
Eosinophils Relative: 0 %
HCT: 45 % (ref 39.0–52.0)
Hemoglobin: 14.4 g/dL (ref 13.0–17.0)
Immature Granulocytes: 0 %
Lymphocytes Relative: 0 %
Lymphs Abs: 0 10*3/uL — ABNORMAL LOW (ref 0.7–4.0)
MCH: 33.7 pg (ref 26.0–34.0)
MCHC: 32 g/dL (ref 30.0–36.0)
MCV: 105.4 fL — ABNORMAL HIGH (ref 80.0–100.0)
Monocytes Absolute: 0.2 10*3/uL (ref 0.1–1.0)
Monocytes Relative: 3 %
Neutro Abs: 5 10*3/uL (ref 1.7–7.7)
Neutrophils Relative %: 97 %
Platelets: 136 10*3/uL — ABNORMAL LOW (ref 150–400)
RBC: 4.27 MIL/uL (ref 4.22–5.81)
RDW: 21.3 % — ABNORMAL HIGH (ref 11.5–15.5)
Smear Review: NORMAL
WBC: 5.2 10*3/uL (ref 4.0–10.5)
nRBC: 25.2 % — ABNORMAL HIGH (ref 0.0–0.2)

## 2021-03-01 LAB — GLUCOSE, CAPILLARY
Glucose-Capillary: 89 mg/dL (ref 70–99)
Glucose-Capillary: 85 mg/dL (ref 70–99)
Glucose-Capillary: 96 mg/dL (ref 70–99)
Glucose-Capillary: 95 mg/dL (ref 70–99)

## 2021-03-01 LAB — FERRITIN: Ferritin: 1022 ng/mL — ABNORMAL HIGH (ref 24–336)

## 2021-03-01 LAB — PHOSPHORUS: Phosphorus: <1 mg/dL — CL (ref 2.5–4.6)

## 2021-03-01 LAB — MAGNESIUM: Magnesium: 2.4 mg/dL (ref 1.7–2.4)

## 2021-03-01 LAB — C-REACTIVE PROTEIN: CRP: 6.3 mg/dL — ABNORMAL HIGH (ref <1.0)

## 2021-03-01 LAB — D-DIMER, QUANTITATIVE: D-Dimer, Quant: 3.93 ug/mL-FEU — ABNORMAL HIGH (ref 0.00–0.50)

## 2021-03-01 MED ORDER — HALOPERIDOL 0.5 MG PO TABS
0.5000 mg | ORAL_TABLET | ORAL | Status: DC | PRN
  Filled 2021-03-01: qty 1

## 2021-03-01 MED ORDER — MORPHINE BOLUS VIA INFUSION
2.0000 mg | INTRAVENOUS | Status: DC | PRN
  Filled 2021-03-01: qty 2

## 2021-03-01 MED ORDER — ONDANSETRON 4 MG PO TBDP
4.0000 mg | ORAL_TABLET | Freq: Four times a day (QID) | ORAL | Status: DC | PRN
  Filled 2021-03-01: qty 1

## 2021-03-01 MED ORDER — POTASSIUM PHOSPHATES 15 MMOLE/5ML IV SOLN
30.0000 mmol | Freq: Once | INTRAVENOUS | Status: DC
  Administered 2021-03-01: 30 mmol via INTRAVENOUS
  Filled 2021-03-01: qty 10

## 2021-03-01 MED ORDER — ACETAMINOPHEN 650 MG RE SUPP: 650.0000 mg | Freq: Four times a day (QID) | RECTAL | Status: DC | PRN

## 2021-03-01 MED ORDER — GLYCOPYRROLATE 0.2 MG/ML IJ SOLN
0.2000 mg | INTRAMUSCULAR | Status: DC | PRN
  Administered 2021-03-01: 0.2 mg via INTRAVENOUS
  Filled 2021-03-01: qty 1

## 2021-03-01 MED ORDER — BIOTENE DRY MOUTH MT LIQD: 15.0000 mL | OROMUCOSAL | Status: DC | PRN

## 2021-03-01 MED ORDER — LORAZEPAM 2 MG/ML PO CONC: 1.0000 mg | ORAL | Status: DC | PRN

## 2021-03-01 MED ORDER — GLYCOPYRROLATE 0.2 MG/ML IJ SOLN: 0.2000 mg | INTRAMUSCULAR | Status: DC | PRN

## 2021-03-01 MED ORDER — LORAZEPAM 1 MG PO TABS: 1.0000 mg | ORAL_TABLET | ORAL | Status: DC | PRN

## 2021-03-01 MED ORDER — LORAZEPAM 2 MG/ML IJ SOLN: 1.0000 mg | INTRAMUSCULAR | Status: DC | PRN

## 2021-03-01 MED ORDER — GLYCOPYRROLATE 1 MG PO TABS
1.0000 mg | ORAL_TABLET | ORAL | Status: DC | PRN
  Filled 2021-03-01: qty 1

## 2021-03-01 MED ORDER — HALOPERIDOL LACTATE 5 MG/ML IJ SOLN: 0.5000 mg | INTRAMUSCULAR | Status: DC | PRN

## 2021-03-01 MED ORDER — ACETAMINOPHEN 325 MG PO TABS: 650.0000 mg | ORAL_TABLET | Freq: Four times a day (QID) | ORAL | Status: DC | PRN

## 2021-03-01 MED ORDER — MORPHINE 100MG IN NS 100ML (1MG/ML) PREMIX INFUSION
2.0000 mg/h | INTRAVENOUS | Status: DC
  Administered 2021-03-01: 2 mg/h via INTRAVENOUS
  Filled 2021-03-01: qty 100

## 2021-03-01 MED ORDER — HALOPERIDOL LACTATE 2 MG/ML PO CONC
0.5000 mg | ORAL | Status: DC | PRN
  Filled 2021-03-01: qty 0.3

## 2021-03-01 MED ORDER — ONDANSETRON HCL 4 MG/2ML IJ SOLN: 4.0000 mg | Freq: Four times a day (QID) | INTRAMUSCULAR | Status: DC | PRN

## 2021-03-01 MED ORDER — POLYVINYL ALCOHOL 1.4 % OP SOLN
1.0000 [drp] | Freq: Four times a day (QID) | OPHTHALMIC | Status: DC | PRN
  Filled 2021-03-01: qty 15

## 2021-03-01 NOTE — Progress Notes (Signed)
PROGRESS NOTE    Craig [name]  TDV:[id number] DOB: Jan 24, 1941 DOA: 02/21/2021 PCP: Townsend Roger, MD   Chief Complain: Fall  Brief Narrative: Patient is a 80 year old male with history of dementia, hyperlipidemia, hypertension, coronary disease, acute on chronic heart failure with reduced ejection fraction Who presented here from [facility] facility with unwitnessed fall.  He was found down after sustaining unwitnessed fall, found to have injuries in the nasal bridge, right nare, swelling of bilateral lower extremities, left knee.  Patient is confused at baseline.  On condition he was found to be hypothermic, hypotensive, tachycardic.  Lab work showed elevated lactate, elevated INR, elevated BNP.  CT head did not show any acute intracranial normalities.  CT maxillofacial and cervical spine showed posttraumatic nasal septal deviation, no nasal fracture, mild L1 compression fracture without retropulsion.  COVID screen test was positive.  Chest imaging showed bilateral pneumonia.  Patient was admitted under PCCM service for concerns of sepsis/covid PNA.  He was also started on vasopressors.  Goals of care discussion done, patient is currently DNR/DNI.  Palliative care following,recommending comfort care .  Patient was transferred to our service on 03/01/2021.   Assessment & Plan:   Active Problems:   Sepsis due to pneumonia ([place])   Sepsis secondary to COVID-pneumonia: Chest CT in presentation showed patchy peribronchial and peripheral extremity groundglass opacity in both lungs typical of COVID-pneumonia.  Currently on 2 L of oxygen per minute.  Completed 5 days of remdesivir course.  He was also empirically started on vancomycin and cefepime, steroids by [facility] service.  Was briefly on pressors, now stopped.  Elevated inflammatory markers including ferritin, CRP, D-dimer.  Also has mild thrombocytopenia.  Currently on therapeutic Lovenox dose. He was found to be tachypneic.   Respiratory status worsening  Acute on chronic systolic congestive heart failure: Last known ejection fraction of less than 20%.  Elevated BNP on presentation.  Suspected to be in cardiogenic shock on presentation.   Was also given few days of IV Lasix, now on hold  Hypertension: Currently blood pressure stable.  Continue current  PRN medications  Elevated troponin: Most likely due to supply demand ischemia/type II MI.  He has history of coronary artery disease status post stents.  History of atrial fibrillation: Currently not on anticoagulation.  Rate controlled  AKI: Resolved.  Currently on Foley catheter.  Elevated liver enzymes: Multifactorial.  Likely history with cardiogenic shock/shock liver, congestive hepatopathy, COVID.    Severe hypophosphatemia: Given iV phosphorus  Goals of care: Elderly patient with dementia, currently with multiple comorbidities including COVID pneumonia.  Palliative care actively following.    Family know about his poor prognosis.  We have recommended comfort care. I talked to the daughter on phone today and she knows about her father's prognosis.  She is talking to her brother and get back to [place]             DVT prophylaxis:Lovenox Code Status: DNR Family Communication: Called and discussed with the daughter on phone on 03/01/2021 Status is: Inpatient  Remains inpatient appropriate because:Inpatient level of care appropriate due to severity of illness  Dispo: The patient is from: SNF              Anticipated d/c is GG:YIRSWNIO death              Patient currently is not medically stable to d/c.   Difficult to place patient No     Consultants: Palliative care, PCCM Antimicrobials:  Anti-infectives (From  admission, onward)    Start     Dose/Rate Route Frequency Ordered Stop   02/25/21 1000  remdesivir 100 mg in sodium chloride 0.9 % 100 mL IVPB  Status:  Discontinued       See Hyperspace for full Linked Orders Report.   100 mg 200  mL/hr over 30 Minutes Intravenous Daily 02/26/2021 0642 02/27/21 1139   03/12/2021 2000  vancomycin (VANCOREADY) IVPB 1250 mg/250 mL  Status:  Discontinued        1,250 mg 166.7 mL/hr over 90 Minutes Intravenous Every 24 hours 02/27/2021 0702 03/16/2021 1037   03/12/2021 1400  ceFEPIme (MAXIPIME) 2 g in sodium chloride 0.9 % 100 mL IVPB  Status:  Discontinued        2 g 200 mL/hr over 30 Minutes Intravenous Every 12 hours 03/10/2021 0659 03/17/2021 1037   02/23/2021 0730  remdesivir 200 mg in sodium chloride 0.9% 250 mL IVPB       See Hyperspace for full Linked Orders Report.   200 mg 580 mL/hr over 30 Minutes Intravenous Once 03/16/2021 0642 03/14/2021 1938   03/12/2021 0215  ceFEPIme (MAXIPIME) 2 g in sodium chloride 0.9 % 100 mL IVPB        2 g 200 mL/hr over 30 Minutes Intravenous  Once 02/23/2021 0200 03/16/2021 0306   02/23/2021 0215  vancomycin (VANCOCIN) IVPB 1000 mg/200 mL premix        1,000 mg 200 mL/hr over 60 Minutes Intravenous  Once 03/01/2021 0200 03/07/2021 0346       Subjective:  Patient seen and examined at the bedside this morning.  He was clearly in moderate to severe respiratory distress.  He was gasping for air, tachypneic.  He did not respond to my verbal stimuli but tried to open his eyes on painful stimulation.  Very sick looking, cachectic  Objective: Vitals:   02/28/21 2000 02/28/21 2235 03/01/21 0103 03/01/21 0527  BP: (!) 110/91 94/70 118/88 122/75  Pulse:  64 (!) 58 (!) 102  Resp: (!) _0 Temp: (!) 97.4 F (36.3 C) 97.7 F (36.5 C)  98.1 F (36.7 C)  TempSrc: Axillary     SpO2: 94% 91% 97% 93%  Weight:      Height:        Intake/Output Summary (Last 24 hours) at 03/01/2021 0755 Last data filed at 02/28/2021 2000 Gross per 24 hour  Intake --  Output 440 ml  Net -440 ml   Filed Weights   02/25/21 0500 02/26/21 0446 02/27/21 0500  Weight: 74.6 kg 77.5 kg 83.4 kg    Examination:  General exam: Extremely sick looking, cachectic, malnourished HEENT:  PERRL Respiratory system: Diminished air entry bilaterally Cardiovascular system: S1 & S2 heard, RRR.  Gastrointestinal system: Abdomen is nondistended, soft and nontender. Central nervous system: Not alert or awake  extremities: No edema, no clubbing ,no cyanosis Skin: No rashes, no ulcers,no icterus   GU: foley    Data Reviewed: I have personally reviewed following labs and imaging studies  CBC: Recent Labs  Lab 02/25/21 0630 02/26/21 0719 02/27/21 0700 02/28/21 0610 03/01/21 0536  WBC 6.5 12.5* 8.5 8.1 5.2  NEUTROABS 5.8 11.7* 8.1* 7.8* 5.0  HGB 12.6* 13.4 13.0 13.8 14.4  HCT 37.9* 42.6 41.9 43.3 45.0  MCV 105.0* 104.4* 109.1* 106.7* 105.4*  PLT 171 160 161 154 427*   Basic Metabolic Panel: Recent Labs  Lab 02/25/21 0630 02/26/21 0719 02/26/21 1005 02/26/21 1435 02/27/21 0700 02/28/21 0610 03/01/21 0536  NA 135 142 142 141 141 144 144  K 4.1 3.9 3.8 4.4 3.9 4.1 4.0  CL 106 107 107 107 109 107 109  CO2 14* 17* 16* 17* 14* 21* 21*  GLUCOSE 109* 73 103* 129* 93 120* 85  BUN 31* 33* 35* 35* 36* 46* 53*  CREATININE 1.18 1.18 1.19 1.21 1.04 1.34* 1.20  CALCIUM 8.9 9.1 9.1 8.9 9.0 9.3 9.6  MG 2.1 2.0  --   --  2.1 2.5* 2.4  PHOS 2.0* 3.0  --   --  3.3 2.6 <1.0*   GFR: Estimated Creatinine Clearance: 59.8 mL/min (by C-G formula based on SCr of 1.2 mg/dL). Liver Function Tests: Recent Labs  Lab 02/25/21 0630 02/26/21 0719 02/27/21 0700 02/28/21 0610 03/01/21 0536  AST 108* 221* 950* 1,058* 956*  ALT 81* 154* 510* 758* 779*  ALKPHOS 77 82 84 92 90  BILITOT 1.7* 2.2* 2.8* 3.1* 3.8*  PROT 6.9 7.6 7.5 8.0 8.0  ALBUMIN 2.1* 2.2* 2.3* 2.5* 2.5*   Recent Labs  Lab 03/10/2021 0250  LIPASE 30   No results for input(s): AMMONIA in the last 168 hours. Coagulation Profile: Recent Labs  Lab 02/20/2021 0250  INR 2.3*   Cardiac Enzymes: Recent Labs  Lab 03/08/2021 0250  CKTOTAL 62   BNP (last 3 results) No results for input(s): PROBNP in the last 8760  hours. HbA1C: Recent Labs    02/27/21 0700  HGBA1C 6.6*   CBG: Recent Labs  Lab 02/28/21 1146 02/28/21 1544 02/28/21 2027 03/01/21 0100 03/01/21 0525  GLUCAP 99 94 87 89 85   Lipid Profile: No results for input(s): CHOL, HDL, LDLCALC, TRIG, CHOLHDL, LDLDIRECT in the last 72 hours. Thyroid Function Tests: No results for input(s): TSH, T4TOTAL, FREET4, T3FREE, THYROIDAB in the last 72 hours. Anemia Panel: Recent Labs    02/28/21 0610 03/01/21 0536  FERRITIN 1,364* 1,022*   Sepsis Labs: Recent Labs  Lab 03/06/2021 0144 03/07/2021 0250 03/14/2021 0455 03/13/2021 0836 03/12/2021 1106  PROCALCITON  --  <0.10  --   --   --   LATICACIDVEN 8.6*  --  7.4* 7.2* 6.6*    Recent Results (from the past 240 hour(s))  Resp Panel by RT-PCR (Flu A&B, Covid) Nasopharyngeal Swab     Status: Abnormal   Collection Time: 03/09/2021  1:46 AM   Specimen: Nasopharyngeal Swab; Nasopharyngeal(NP) swabs in vial transport medium  Result Value Ref Range Status   SARS Coronavirus 2 by RT PCR POSITIVE (A) NEGATIVE Final    Comment: RESULT CALLED TO, READ BACK BY AND VERIFIED WITH: KELLY [email or web address]  03/18/2021 RH (NOTE) SARS-CoV-2 target nucleic acids are DETECTED.  The SARS-CoV-2 RNA is generally detectable in upper respiratory specimens during the acute phase of infection. Positive results are indicative of the presence of the identified virus, but do not rule out bacterial infection or co-infection with other pathogens not detected by the test. Clinical correlation with patient history and other diagnostic information is necessary to determine patient infection status. The expected result is Negative.  Fact Sheet for Patients: EntrepreneurPulse.com.au  Fact Sheet for Healthcare Providers: IncredibleEmployment.be  This test is not yet approved or cleared by the [place] FDA and  has been authorized for detection and/or diagnosis of SARS-CoV-2 by FDA under an  Emergency Use Authorization (EUA).  This EUA will remain in effect (meaning this test can be used)  for the duration of  the COVID-19 declaration under Section 564(b)(1) of the Act, 21 U.S.C. section 360bbb-3(b)(1), unless the authorization  is terminated or revoked sooner.     Influenza A by PCR NEGATIVE NEGATIVE Final   Influenza B by PCR NEGATIVE NEGATIVE Final    Comment: (NOTE) The Xpert Xpress SARS-CoV-2/FLU/RSV plus assay is intended as an aid in the diagnosis of influenza from Nasopharyngeal swab specimens and should not be used as a sole basis for treatment. Nasal washings and aspirates are unacceptable for Xpert Xpress SARS-CoV-2/FLU/RSV testing.  Fact Sheet for Patients: EntrepreneurPulse.com.au  Fact Sheet for Healthcare Providers: IncredibleEmployment.be  This test is not yet approved or cleared by the [place] FDA and has been authorized for detection and/or diagnosis of SARS-CoV-2 by FDA under an Emergency Use Authorization (EUA). This EUA will remain in effect (meaning this test can be used) for the duration of the COVID-19 declaration under Section 564(b)(1) of the Act, 21 U.S.C. section 360bbb-3(b)(1), unless the authorization is terminated or revoked.  Performed at [facility], [address] [place], [place] [address]   Blood Culture (routine x 2)     Status: None (Preliminary result)   Collection Time: 03/14/2021  1:49 AM   Specimen: BLOOD  Result Value Ref Range Status   Specimen Description BLOOD LEFT ARM  Final   Special Requests   Final    BOTTLES DRAWN AEROBIC AND ANAEROBIC Blood Culture adequate volume   Culture   Final    NO GROWTH 4 DAYS Performed at [facility], [address], [place], [place] [address]    Report Status PENDING  Incomplete  Blood Culture (routine x 2)     Status: None (Preliminary result)   Collection Time: 03/03/2021  1:55 AM   Specimen: BLOOD  Result Value Ref  Range Status   Specimen Description BLOOD RIGHT WRIST  Final   Special Requests   Final    BOTTLES DRAWN AEROBIC ONLY Blood Culture results may not be optimal due to an inadequate volume of blood received in culture bottles   Culture   Final    NO GROWTH 4 DAYS Performed at [facility], [address] [place], [place] [address]    Report Status PENDING  Incomplete  Urine Culture     Status: None   Collection Time: 03/14/2021  2:50 AM   Specimen: Urine, Random  Result Value Ref Range Status   Specimen Description   Final    URINE, RANDOM Performed at [facility], [address] [place], [place] [address]    Special Requests   Final    NONE Performed at [facility], [address] [place], [place] [address]    Culture   Final    NO GROWTH Performed at [facility], [place] [address], [place], [place] [address]    Report Status 02/25/2021 FINAL  Final  MRSA Next Gen by PCR, Nasal     Status: None   Collection Time: 02/19/2021  6:51 AM   Specimen: Nasal Mucosa; Nasal Swab  Result Value Ref Range Status   MRSA by PCR Next Gen NOT DETECTED NOT DETECTED Final    Comment: (NOTE) The GeneXpert MRSA Assay (FDA approved for NASAL specimens only), is one component of a comprehensive MRSA colonization surveillance program. It is not intended to diagnose MRSA infection nor to guide or monitor treatment for MRSA infections. Test performance is not FDA approved in patients less than 48 years old. Performed at [facility], [address], [place], [place] [address]          Radiology  Studies: No results found.      Scheduled Meds:  chlorhexidine gluconate (MEDLINE KIT)  15 mL Mouth Rinse BID   Chlorhexidine Gluconate Cloth  6 each Topical Q0600   enoxaparin (LOVENOX) injection  1 mg/kg Subcutaneous P20P   folic acid  1 mg Intravenous Daily   insulin aspart  0-9 Units Subcutaneous Q4H   mouth rinse  15 mL Mouth Rinse 10 times per  day   thiamine injection  100 mg Intravenous Daily   Continuous Infusions:  sodium chloride       LOS: 5 days    Time spent: 35 mins.More than 50% of that time was spent in counseling and/or coordination of care.      Shelly Coss, MD [facility] P9/13/2022, 7:55 AM

## 2021-03-01 NOTE — Progress Notes (Signed)
   03/01/21 0840  Assess: MEWS Score  Temp 98.1 F (36.7 C)  BP 107/65  Pulse Rate 94  Resp (!) 24  Level of Consciousness Responds to Voice  SpO2 95 %  O2 Device Nasal Cannula  O2 Flow Rate (L/min) 3 L/min  Assess: MEWS Score  MEWS Temp 0  MEWS Systolic 0  MEWS Pulse 0  MEWS RR 1  MEWS LOC 1  MEWS Score 2  MEWS Score Color Yellow  Assess: if the MEWS score is Yellow or Red  Were vital signs taken at a resting state? Yes  Focused Assessment No change from prior assessment  Does the patient meet 2 or more of the SIRS criteria? No  MEWS guidelines implemented *See Row Information* No, previously yellow, continue vital signs every 4 hours  Assess: SIRS CRITERIA  SIRS Temperature  0  SIRS Pulse 1  SIRS Respirations  1  SIRS WBC 0  SIRS Score Sum  2

## 2021-03-01 NOTE — Care Management Important Message (Signed)
Important Message  Patient Details  Name: Craig [name] MRN: [id number] Date of Birth: 20-May-1941   Medicare Important Message Given:     Patient placed on comfort care today and out of respect for the patient and family no Important Message given.   [name] A Eisa Necaise 03/01/2021, 2:10 PM

## 2021-03-01 NOTE — Progress Notes (Signed)
PHARMACY CONSULT NOTE - FOLLOW UP  Pharmacy Consult for Electrolyte Monitoring and Replacement   Recent Labs: Potassium (mmol/L)  Date Value  03/01/2021 4.0   Magnesium (mg/dL)  Date Value  03/01/2021 2.4   Calcium (mg/dL)  Date Value  03/01/2021 9.6   Albumin (g/dL)  Date Value  03/01/2021 2.5 (L)   Phosphorus (mg/dL)  Date Value  03/01/2021 <1.0 (LL)   Sodium (mmol/L)  Date Value  03/01/2021 144     Assessment: 80 year old male admitted after a fall at facility. History of atrial flutter, no anticoagulation at baseline. Patient found to be Covid positive and started on remdesivir and steroids. Also found to have DVT bilateral lower extremities. Pharmacy consult for electrolyte management.  9/12: no MIVF; transaminases elevated (stopped remdes day 4 of 5)  Labs: Scr 1.18>1.21>1.34 Mg 2.1>2.5 Phos: 3.0>2.6  Goal of Therapy:  Electrolytes WNL  Plan:  K 4.0  Mag 2.4  Phos <1.0  Scr 1.20  Na 144 --MD has ordered Potassium Phosphate 30 mmol IV x 1 dose --f/u electrolytes with morning labs  Noralee Space, PharmD Clinical Pharmacist 03/01/2021 8:42 AM

## 2021-03-01 NOTE — Progress Notes (Signed)
Lab called about pt critical value of phosphorous, MD made aware , see MD order

## 2021-03-01 NOTE — Progress Notes (Addendum)
Daily Progress Note   Patient Name: Craig [name]       Date: 03/01/2021 DOB: 12-23-1940  Age: 80 y.o. MRN#: [id number] Attending Physician: Shelly Coss, MD Primary Care Physician: Nona Dell, Corene Cornea, MD Admit Date: 03/04/2021  Reason for Consultation/Follow-up: Establishing goals of care  Subjective: Patient is resting in bed with eyes closed. He is tachypnic at this time. Called to speak with daughter x2. Unable to reach her. Called to speak with son. He states his sister who is equal in decision making did not call him yesterday, and they have not discussed care moving forward. Discussed his father's status and conversations about possibly moving him back to ICU/step down. He understands in a natural process his father will die. He understands his prognosis is limited to days to weeks with continued life prolonging care, and without a feeding tube which the siblings have already decided against previously. He states he would like comfort care as he does not want his father to suffer. He is aware with shifting to comfort care, he will not be stable to move to a hospice facility and he will die in this hospitalization.  He states he will try to reach his sister. He is aware he could die at any time.  Attending MD and RN updated and made aware of conversation.   ADDENDUM: Message received from attending that he was able to reach daughter and that she will be speaking with Berneta Sages about care moving forward. Soon after, patient's daughter called floor to speak with me. She states she has talked with her brother and would like to shift to comfort care. We discussed comfort care and providing no further life prolonging care, only care for symptom management to make him comfortable. She understands he is  in the dying process. Discussed that he will die during this hospitalization and there are not plans to move him to hospice facility. She is amenable and states she will come after 4:00pm. She is aware he could die at any time.   I completed a MOST form today with daughter through [facility] and the signed original was placed in the chart. A photocopy was placed in the chart to be scanned into EMR. The patient outlined their wishes for the following treatment decisions:  Cardiopulmonary Resuscitation: Do Not Attempt Resuscitation (DNR/No CPR)  Medical Interventions: Comfort Measures: Keep clean, warm, and dry. Use medication by any route, positioning, wound care, and other measures to relieve pain and suffering. Use oxygen, suction and manual treatment of airway obstruction as needed for comfort. Do not transfer to the hospital unless comfort needs cannot be met in current location.  Antibiotics: No antibiotics (use other measures to relieve symptoms)  IV Fluids: No IV fluids (provide other measures to ensure comfort)  Feeding Tube: No feeding tube     Length of Stay: 5  Current Medications: Scheduled Meds:  . chlorhexidine gluconate (MEDLINE KIT)  15 mL Mouth Rinse BID  . Chlorhexidine Gluconate Cloth  6 each Topical Q0600  . enoxaparin (LOVENOX) injection  1 mg/kg Subcutaneous Q12H  . folic acid  1 mg Intravenous Daily  . insulin aspart  0-9 Units Subcutaneous Q4H  . mouth rinse  15 mL Mouth Rinse 10 times per day  . thiamine injection  100 mg Intravenous Daily    Continuous Infusions: . sodium chloride    . potassium PHOSPHATE IVPB (in mmol) 30 mmol (03/01/21 1004)    PRN Meds: LORazepam, metoprolol tartrate, morphine injection  Physical Exam Constitutional:      Comments: Eyes closed.   Pulmonary:     Comments: tachypnea           Vital Signs: BP 107/65 (BP Location: Right Arm)   Pulse 94   Temp 98.1 F (36.7 C) (Oral)   Resp (!) 24   Ht _0  (1.905 m)   Wt 83.4 kg    SpO2 95%   BMI 22.98 kg/m  SpO2: SpO2: 95 % O2 Device: O2 Device: Nasal Cannula O2 Flow Rate: O2 Flow Rate (L/min): 3 L/min  Intake/output summary:  Intake/Output Summary (Last 24 hours) at 03/01/2021 1103 Last data filed at 02/28/2021 2000 Gross per 24 hour  Intake --  Output 355 ml  Net -355 ml   LBM: Last BM Date:  (PTA) Baseline Weight: Weight: 79.6 kg Most recent weight: Weight: 83.4 kg    Patient Active Problem List   Diagnosis Date Noted  . Sepsis due to pneumonia ([place]) 03/17/2021  . Acute on chronic heart failure ([place]) 12/24/2020  . Acute on chronic HFrEF (heart failure with reduced ejection fraction) ([place]) 12/23/2020  . Acute respiratory failure with hypoxia ([place]) 12/23/2020  . HFrEF (heart failure with reduced ejection fraction) ([place])   . Atrial flutter with rapid ventricular response ([place]) 05/26/2020  . SBO (small bowel obstruction) ([place]) 05/26/2020  . Hypernatremia 05/26/2020  . Nicotine dependence 05/26/2020  . Protein-calorie malnutrition, severe 04/22/2020  . Dementia with behavioral disturbance ([place]) 04/21/2020  . Altered mental status 04/20/2020  . Atrial flutter ([place])   . Bradycardia   . CAD (coronary artery disease), native coronary artery 12/25/2017  . History of coronary artery stent placement 12/25/2017  . Hyperlipidemia 12/25/2017  . Former smoker 12/25/2017  . Essential hypertension 12/25/2017  . Memory loss 12/25/2017    Palliative Care Assessment & Plan    Recommendations/Plan: Son would like comfort care. He states his sister did not call him yesterday. He will try to reach her. Sister is equal in decision making as there is no HPOA.   ADDENDUM: Daughter called floor and states to me she would like to proceed with comfort care now.     Code Status:    Code Status Orders  (From admission, onward)           Start     Ordered  02/26/21 1706  Do not attempt resuscitation (DNR)  Continuous       Question Answer Comment  In the  event of cardiac or respiratory ARREST Do not call a "code blue"   In the event of cardiac or respiratory ARREST Do not perform Intubation, CPR, defibrillation or ACLS   In the event of cardiac or respiratory ARREST Use medication by any route, position, wound care, and other measures to relive pain and suffering. May use oxygen, suction and manual treatment of airway obstruction as needed for comfort.   Comments per daughter Roselyn Reef  [place]      02/26/21 1705           Code Status History     Date Active Date Inactive Code Status Order ID Comments User Context   03/18/2021 0555 02/26/2021 1705 Full Code [id number]  Lang Snow, NP ED   12/23/2020 0212 01/06/2021 2324 Full Code [id number]  Etta Quill, DO ED   05/27/2020 0043 06/15/2020 2314 Full Code [id number]  Athena Masse, MD ED   04/20/2020 2040 04/26/2020 1851 Full Code [id number]  Lenore Cordia, MD ED       Prognosis:  Very poor   Care plan was discussed with primary MD and RN and charge RN  Thank you for allowing the Palliative Medicine Team to assist in the care of this patient.       Total Time 60mn Prolonged Time Billed  no       Greater than 50%  of this time was spent counseling and coordinating care related to the above assessment and plan.  CAsencion Gowda NP  Please contact Palliative Medicine Team phone at [phone] questions and concerns.

## 2021-03-19 NOTE — Death Summary Note (Signed)
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  He was found down after sustaining unwitnessed fall, found to have injuries in the nasal bridge, right nare, swelling of bilateral lower extremities, left knee.  Patient is confused at baseline.  On condition he was found to be hypothermic, hypotensive, tachycardic.  Lab work showed elevated lactate, elevated INR, elevated BNP.  CT head did not show any acute intracranial normalities.  CT maxillofacial and cervical spine showed posttraumatic nasal septal deviation, no nasal fracture, mild L1 compression fracture without retropulsion.  COVID screen test was positive.  Chest imaging showed bilateral pneumonia.  Patient was admitted under PCCM service for concerns of sepsis/covid PNA.  He was also started on vasopressors.  Goals of care discussion done, patient is currently DNR/DNI.  Palliative care following,recommended comfort care .  After discussion with family,comfort care started.He passed away on March 26, 2021 at 0235.  Final Diagnoses:  1.   Sepsis secondary to Covid    The results of significant diagnostics from this hospitalization (including imaging, microbiology, ancillary and laboratory) are listed below for reference.    Significant Diagnostic Studies: CT HEAD WO CONTRAST  Result Date: Mar 20, 2021 CLINICAL DATA:  80 year old male status post unwitnessed fall. Epistaxis. Dementia. EXAM: CT HEAD WITHOUT CONTRAST TECHNIQUE: Contiguous axial images were obtained from the base of the skull through the vertex without intravenous contrast. COMPARISON:  Head  CT 04/20/2020. FINDINGS: Brain: Chronically advanced and confluent bilateral cerebral white matter hypodensity has not significantly changed from last year. Chronic basal ganglia vascular calcifications. No midline shift, ventriculomegaly, mass effect, evidence of mass lesion, intracranial hemorrhage or evidence of cortically based acute infarction. Stable dural calcifications including along the falx. Vascular: Calcified atherosclerosis at the skull base. No suspicious intracranial vascular hyperdensity. Skull: Calvarium is intact.  Facial bones are reported separately. Sinuses/Orbits: Increased paranasal sinus mucosal thickening, with fluid and bubbly opacity now in the left frontal sinus and both frontoethmoidal recesses. Sphenoid and right maxillary sinuses remain clear. Tympanic cavities and mastoids are clear. Other: No orbit or scalp soft tissue injury identified. See also face CT reported separately. IMPRESSION: 1. No acute intracranial abnormality or acute traumatic injury identified. 2. Chronically advanced cerebral white matter disease. 3. See also Face CT reported separately. Electronically Signed   By: Genevie Ann M.D.   On: 20-Mar-2021 05:11   CT CERVICAL SPINE WO CONTRAST  Result Date: 03-20-21 CLINICAL DATA:  80 year old male status post unwitnessed fall. Epistaxis. Dementia. EXAM: CT CERVICAL SPINE WITHOUT CONTRAST TECHNIQUE: Multidetector CT imaging of the cervical spine was performed without intravenous contrast. Multiplanar CT image reconstructions were also generated. COMPARISON:  CT head and face today. FINDINGS: Alignment: Straightening of cervical lordosis. Mild anterolisthesis of C7 on T1 is associated with facet hypertrophy which is greater on the left. Bilateral posterior element alignment is within normal limits. Skull base and vertebrae: Visualized skull base is intact. No atlanto-occipital dissociation. C1 and C2 appear intact and aligned. No acute osseous abnormality identified. Soft  tissues and spinal canal: No prevertebral fluid or swelling. No visible canal hematoma. Fairly abundant scattered but intravenous appearing neck soft tissue gas, likely related to recent IV  access. Disc levels: Widespread advanced cervical spine degeneration is superimposed on congenital appearing spinal canal narrowing. Subsequently there is multilevel spinal stenosis which is at least moderate at C3-C4 through C5-C6. Upper chest: There is patchy opacity in the left lung apex. Visible upper thoracic levels appear grossly intact. IMPRESSION: 1. No acute traumatic injury identified in the cervical spine. Mild degenerative appearing anterolisthesis of C7 on T1. 2. 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 Subsequent widespread cervical spinal stenosis which is suspected to be moderate or greater C3-C4 through C5-C6. 3. Patchy opacity in the left lung apex. See Chest CT reported separately. Electronically Signed   By: Genevie Ann M.D.   On: 02/22/2021 05:21   CT CHEST ABDOMEN PELVIS W CONTRAST  Result Date: 03/16/2021 CLINICAL DATA:  80 year old male status post unwitnessed fall. Epistaxis. Dementia. Possible sepsis. EXAM: CT CHEST, ABDOMEN, AND PELVIS WITH CONTRAST TECHNIQUE: Multidetector CT imaging of the chest, abdomen and pelvis was performed following the standard protocol during bolus administration of intravenous contrast. CONTRAST:  24m OMNIPAQUE IOHEXOL 350 MG/ML SOLN COMPARISON:  Cervical spine CT today. Chest CT 04/24/2020. CT Abdomen and Pelvis 04/20/2020. FINDINGS: CT CHEST FINDINGS Cardiovascular: Cardiomegaly has substantially progressed since last year. See series 2, image 43. And there is a somewhat stagnant appearance of left extremity and subclavian injected IV contrast plus some contrast reflux into the hepatic IVC and hepatic veins suggesting a degree of heart failure. There are also enhancing left shoulder and paravertebral venous  collaterals, and a small volume of intravenous gas at the neck. Calcified coronary artery atherosclerosis. Calcified aortic atherosclerosis. There is little contrast in the aorta. No pericardial effusion. Mediastinum/Nodes: No mediastinal hematoma or lymphadenopathy identified. Lungs/Pleura: Scattered patchy but confluent ground-glass opacity in both lungs which is both peripheral and peribronchial. Only the right upper lobe is relatively spared. Major airways remain patent. No consolidation or pleural effusion. There is some underlying centrilobular emphysema. No pneumothorax. Musculoskeletal: Respiratory motion artifact and osteopenia limit rib evaluation. Chronic left 7th through 9th rib fractures are redemonstrated. Healing fracture of the left posterolateral 10th rib is new since last year. A healing fracture of the right 12th rib is new since last year. Chronic anterior right 10th rib deformity appears stable. There is no obvious acute rib fracture. Sternum, visible shoulder osseous structures, and thoracic vertebrae also appear to remain intact. CT ABDOMEN PELVIS FINDINGS Hepatobiliary: Reflux of contrast from the right heart to the IVC and hepatic veins, with otherwise little liver enhancement. No liver injury is evident. Grossly negative gallbladder. Pancreas: Essentially noncontrast appearance of the pancreas is negative. Spleen: Limited by motion artifact and lack of contrast. No obvious splenic injury. Adrenals/Urinary Tract: Nephrographic enhancement on the delayed images which also demonstrate abdominal aorta and other visceral enhancement. Normal adrenal glands. The kidneys appear symmetric, nonobstructed, and intact. Diminutive, unremarkable bladder. Stomach/Bowel: Generalized mesenteric congestion, edema. Retained air and stool in the rectum, but no dilated large or small bowel elsewhere. No free air or free fluid is identified. But otherwise bowel detail is limited. Vascular/Lymphatic: Extensive  Aortoiliac calcified atherosclerosis. Normal caliber abdominal aorta. There is little intravascular contrast in the abdomen and pelvis. No lymphadenopathy identified. Reproductive: Sequelae of prostate brachytherapy again noted. Other: Generalized body wall edema is new from last year. No definite pelvic free fluid. Musculoskeletal: Mild L1 superior endplate compression fracture is new since last year. Less than 25% loss of vertebral body height with no retropulsion or complicating features. L2 vertebral body lucency which  is likely hemangioma related is stable. Left ulna ORIF is visible. The sacrum, pelvis, and proximal femurs appear intact. IMPRESSION: 1. Patchy peribronchial and peripheral asymmetric ground-glass opacity in both lungs has a typical appearance of COVID-19 pneumonia. Other viral/atypical respiratory infection is possible. 2. Progressed cardiomegaly since last year with evidence of heart failure, including: Reflux of contrast into the liver, delayed systemic arterial enhancement, anasarca. 3. Mild L1 superior endplate compression fracture is new since last year and may be acute. No retropulsion or complicating features. 4. But the study is limited by motion artifact with no other acute traumatic injury identified in the chest, abdomen, or pelvis. - osteopenia. - healing fractures of the left 10th and right 12th ribs are new from last year. 5. Extensive Aortic atherosclerosis (ICD10-I70.0). Electronically Signed   By: Genevie Ann M.D.   On: 02/23/2021 05:35   US Venous Img Lower Bilateral (DVT)  Result Date: 02/25/2021 CLINICAL DATA:  80 year old male with bilateral lower extremity edema. EXAM: BILATERAL LOWER EXTREMITY VENOUS DOPPLER ULTRASOUND TECHNIQUE: Gray-scale sonography with graded compression, as well as color Doppler and duplex ultrasound were performed to evaluate the lower extremity deep venous systems from the level of the common femoral vein and including the common femoral, femoral,  profunda femoral, popliteal and calf veins including the posterior tibial, peroneal and gastrocnemius veins when visible. The superficial great saphenous vein was also interrogated. Spectral Doppler was utilized to evaluate flow at rest and with distal augmentation maneuvers in the common femoral, femoral and popliteal veins. COMPARISON:  None. FINDINGS: RIGHT LOWER EXTREMITY Common Femoral Vein: No evidence of thrombus. Normal compressibility, respiratory phasicity and response to augmentation. Saphenofemoral Junction: No evidence of thrombus. Normal compressibility and flow on color Doppler imaging. Profunda Femoral Vein: Nonocclusive, expansile, acute appearing thrombus in the central profunda. Femoral Vein: No evidence of thrombus. Normal compressibility, respiratory phasicity and response to augmentation. Popliteal Vein: No evidence of thrombus. Normal compressibility, respiratory phasicity and response to augmentation. Calf Veins: Nonocclusive, acute appearing thrombus in the posterior tibial and peroneal veins. Other Findings:  None. LEFT LOWER EXTREMITY Common Femoral Vein: No evidence of thrombus. Normal compressibility, respiratory phasicity and response to augmentation. Saphenofemoral Junction: No evidence of thrombus. Normal compressibility and flow on color Doppler imaging. Profunda Femoral Vein:Nonocclusive, expansile, acute appearing thrombus in the central profunda. Femoral Vein: No evidence of thrombus. Normal compressibility, respiratory phasicity and response to augmentation. Popliteal Vein: No evidence of thrombus. Normal compressibility, respiratory phasicity and response to augmentation. Calf Veins: Nonocclusive, acute appearing thrombus in the posterior tibial and peroneal veins. Other Findings:  None. IMPRESSION: Symmetric bilateral acute, nonocclusive deep vein thrombosis involving the bilateral profunda femoral and bilateral posterior tibial and peroneal veins. These results were called by  telephone at the time of interpretation on 03/01/2021 at 10:54 am to provider [facility] , who verbally acknowledged these results. Ruthann Cancer, MD [facility] Electronically Signed   By: Ruthann Cancer M.D.   On: 02/23/2021 10:54   DG Chest Port 1 View  Result Date: 03/15/2021 CLINICAL DATA:  Questionable sepsis EXAM: PORTABLE CHEST 1 VIEW COMPARISON:  12/23/2020 FINDINGS: Cardiomegaly. Mild interstitial prominence could reflect interstitial edema. No confluent airspace opacity or effusion. IMPRESSION: Cardiomegaly.  Suspect mild interstitial edema. Electronically Signed   By: Rolm Baptise M.D.   On: 03/18/2021 02:35   DG Knee Complete 4 Views Left  Result Date: 03/14/2021 CLINICAL DATA:  Fall, left knee swelling EXAM: LEFT KNEE - COMPLETE 4+ VIEW COMPARISON:  None. FINDINGS: Anterior soft tissue swelling. No acute bony abnormality. Specifically, no fracture, subluxation, or dislocation. Joint spaces maintained. No joint effusion. IMPRESSION: No acute bony abnormality. Electronically Signed   By: Rolm Baptise M.D.   On: 02/21/2021 02:35   ECHOCARDIOGRAM COMPLETE  Result Date: 03/18/2021    ECHOCARDIOGRAM REPORT   Patient Name:   Craig [name] Date of Exam: 03/13/2021 Medical Rec #:  [id number]            Height:       75.0 in Accession #:    [id number]           Weight:       168.9 lb Date of Birth:  [birth year]             BSA:          2.042 m Patient Age:    39 years             BP:           101/74 mmHg Patient Gender: M                    HR:           61 bpm. Exam Location:  [facility] Procedure: 2D Echo, Color Doppler and Cardiac Doppler Indications:     I50.21 congestive heart failure-Acute Systolic  History:         Patient has prior history of Echocardiogram examinations, most                  recent 12/23/2020. CAD, TIA; Risk Factors:Hypertension and                  Dyslipidemia. Pt tested positive for COVID-19 on 02/17/2021.  Sonographer:      Charmayne Sheer Referring Phys:  [id number] Bradly Bienenstock Diagnosing Phys: Yolonda Kida MD  Sonographer Comments: Suboptimal subcostal window and suboptimal apical window. IMPRESSIONS  1. Left ventricular ejection fraction, by estimation, is 30 to 35%. The left ventricle has moderately decreased function. The left ventricle demonstrates global hypokinesis. The left ventricular internal cavity size was mildly to moderately dilated. Left ventricular diastolic parameters were normal. There is the interventricular septum is flattened in diastole ('D' shaped left ventricle), consistent with right ventricular volume overload.  2. Right ventricular systolic function is severely reduced. The right ventricular size is moderately enlarged.  3. Left atrial size was mild to moderately dilated.  4. Right atrial size was mild to moderately dilated.  5. The mitral valve is normal in structure. Mild to moderate mitral valve regurgitation.  6. Tricuspid valve regurgitation is mild to moderate.  7. The aortic valve is normal in structure. Aortic valve regurgitation is trivial. FINDINGS  Left Ventricle: Left ventricular ejection fraction, by estimation, is 30 to 35%. The left ventricle has moderately decreased function. The left ventricle demonstrates global hypokinesis. The left ventricular internal cavity size was mildly to moderately  dilated. There is no concentric left ventricular hypertrophy. The interventricular septum is flattened in diastole ('D' shaped left ventricle), consistent with right ventricular volume overload. Left ventricular diastolic parameters were normal. Right Ventricle: The right ventricular size is moderately enlarged. No increase in right ventricular wall thickness. Right ventricular systolic function is severely reduced. Left Atrium: Left atrial size was mild to moderately dilated. Right Atrium: Right atrial size was mild to moderately dilated. Pericardium: There is no evidence of pericardial effusion.  Mitral Valve: The mitral valve is normal in structure.  Mild to moderate mitral valve regurgitation. MV peak gradient, 1.3 mmHg. The mean mitral valve gradient is 1.0 mmHg. Tricuspid Valve: The tricuspid valve is grossly normal. Tricuspid valve regurgitation is mild to moderate. Aortic Valve: The aortic valve is normal in structure. Aortic valve regurgitation is trivial. Aortic valve mean gradient measures 1.0 mmHg. Aortic valve peak gradient measures 1.8 mmHg. Aortic valve area, by VTI measures 2.72 cm. Pulmonic Valve: The pulmonic valve was grossly normal. Pulmonic valve regurgitation is not visualized. Aorta: The ascending aorta was not well visualized. IAS/Shunts: No atrial level shunt detected by color flow Doppler.  LEFT VENTRICLE PLAX 2D LVIDd:         5.40 cm LVIDs:         4.60 cm LV PW:         1.30 cm LV IVS:        1.00 cm LVOT diam:     2.30 cm LV SV:         28 LV SV Index:   14 LVOT Area:     4.15 cm  RIGHT VENTRICLE RV Basal diam:  4.00 cm LEFT ATRIUM           Index       RIGHT ATRIUM           Index LA diam:      4.10 cm 2.01 cm/m  RA Area:     38.10 cm LA Vol (A4C): 27.7 ml 13.56 ml/m RA Volume:   158.00 ml 77.37 ml/m  AORTIC VALVE                   PULMONIC VALVE AV Area (Vmax):    2.76 cm    PV Vmax:          0.51 m/s AV Area (Vmean):   2.64 cm    PV Vmean:         34.300 cm/s AV Area (VTI):     2.72 cm    PV VTI:           0.076 m AV Vmax:           67.90 cm/s  PV Peak grad:     1.0 mmHg AV Vmean:          46.500 cm/s PV Mean grad:     1.0 mmHg AV VTI:            0.104 m     PR End Diast Vel: 9.36 msec AV Peak Grad:      1.8 mmHg AV Mean Grad:      1.0 mmHg LVOT Vmax:         45.10 cm/s LVOT Vmean:        29.500 cm/s LVOT VTI:          0.068 m LVOT/AV VTI ratio: 0.66  AORTA Ao Root diam: 3.80 cm MITRAL VALVE               TRICUSPID VALVE MV Area (PHT): 7.29 cm    TR Peak grad:   25.0 mmHg MV Area VTI:   2.40 cm    TR Vmax:        250.00 cm/s MV Peak grad:  1.3 mmHg MV Mean grad:  1.0  mmHg    SHUNTS MV Vmax:       0.58 m/s    Systemic VTI:  0.07 m MV Vmean:      39.1 cm/s   Systemic Diam: 2.30 cm MV Decel  Time: 104 msec MV E velocity: 45.40 cm/s MV A velocity: 35.60 cm/s MV E/A ratio:  1.28 Yolonda Kida MD Electronically signed by Yolonda Kida MD Signature Date/Time: 03/17/2021/10:07:48 PM    Final    CT MAXILLOFACIAL WO CONTRAST  Result Date: 03/04/2021 CLINICAL DATA:  80 year old male status post unwitnessed fall. Epistaxis. Dementia. EXAM: CT MAXILLOFACIAL WITHOUT CONTRAST TECHNIQUE: Multidetector CT imaging of the maxillofacial structures was performed. Multiplanar CT image reconstructions were also generated. COMPARISON:  Head CT today.  Head CT 04/20/2020. FINDINGS: Osseous: Mandible is intact and normally located with some bilateral TMJ degeneration. Scattered dental caries. Maxilla remain intact. No zygoma fracture. No pterygoid fracture. Bilateral nasal bones appear to remain intact but there is trace posttraumatic appearing soft tissue gas along the bridge of the nose and anterior nasal septum series 3, image 33. Intact central skull base.  Cervical spine reported separately. Orbits: No orbital wall fracture. Orbits soft tissues appears symmetric and within normal limits. Sinuses: Rightward nasal septal deviation is age indeterminate and might be posttraumatic. But the nasal cavity is well aerated. Some of the bubbly opacity in the bilateral frontoethmoidal recesses and left frontal sinus might be hemorrhage. But there is also trace mucosal thickening. Right maxillary, posterior ethmoid, sphenoid sinuses, tympanic cavities and mastoids are clear. Soft tissues: A small volume of gas in the bilateral submandibular space appears to be intravenous, likely related to recent IV access. No deep soft tissue space traumatic injury is evident in the absence of IV contrast. There is mild cervical carotid calcified atherosclerosis. Limited intracranial: Stable to that reported separately  today. IMPRESSION: 1. Trace posttraumatic soft tissue gas at the bridge of the nose and anterior nasal septum - with possible acute posttraumatic septal deviation. 2. But no nasal bone or other acute facial fracture is identified. 3. Trace soft tissue gas in the bilateral neck appears to be intravenous and is likely related to recent IV access. 4. Scattered dental caries. Electronically Signed   By: Genevie Ann M.D.   On: 03/03/2021 05:17    Microbiology: No results found for this or any previous visit (from the past 240 hour(s)).   Labs: Basic Metabolic Panel: Recent Labs  Lab 03/01/21 0536  NA 144  K 4.0  CL 109  CO2 21*  GLUCOSE 85  BUN 53*  CREATININE 1.20  CALCIUM 9.6  MG 2.4  PHOS <1.0*   Liver Function Tests: Recent Labs  Lab 03/01/21 0536  AST 956*  ALT 779*  ALKPHOS 90  BILITOT 3.8*  PROT 8.0  ALBUMIN 2.5*   No results for input(s): LIPASE, AMYLASE in the last 168 hours. No results for input(s): AMMONIA in the last 168 hours. CBC: Recent Labs  Lab 03/01/21 0536  WBC 5.2  NEUTROABS 5.0  HGB 14.4  HCT 45.0  MCV 105.4*  PLT 136*   Cardiac Enzymes: No results for input(s): CKTOTAL, CKMB, CKMBINDEX, TROPONINI in the last 168 hours. D-Dimer No results for input(s): DDIMER in the last 72 hours. BNP: Invalid input(s): POCBNP CBG: Recent Labs  Lab 02/28/21 2027 03/01/21 0100 03/01/21 0525 03/01/21 0837 03/01/21 1231  GLUCAP 87 89 85 96 95   Anemia work up No results for input(s): VITAMINB12, FOLATE, FERRITIN, TIBC, IRON, RETICCTPCT in the last 72 hours. Urinalysis    Component Value Date/Time   COLORURINE YELLOW 02/26/2021 0250   APPEARANCEUR CLOUDY (A) 02/28/2021 0250   LABSPEC 1.020 03/11/2021 0250   PHURINE 5.5 02/23/2021 0250   GLUCOSEU 500 (A) 02/25/2021  [place] 03/10/2021 0250   BILIRUBINUR SMALL (A) 03/06/2021 0250   KETONESUR NEGATIVE 02/23/2021 0250   PROTEINUR 30 (A) 03/05/2021 0250   NITRITE NEGATIVE 03/11/2021 0250    LEUKOCYTESUR NEGATIVE 03/05/2021 0250   Sepsis Labs Invalid input(s): PROCALCITONIN,  WBC,  LACTICIDVEN     SIGNED:  Shelly Coss, MD  [facility] 03/07/2021, 12:31 PM Pager [phone]  If 7PM-7AM, please contact night-coverage [email or web address] Password [email or web address]

## 2021-03-19 DEATH — deceased

## 2021-12-24 IMAGING — DX DG CHEST 1V PORT
1 series · 2 of 2 positions shown · non-contrast
Comparison: 09/08/2017

CLINICAL DATA: Right lower abdominal pain, altered level of
consciousness, previous tobacco abuse

EXAM:
PORTABLE CHEST 1 VIEW

[Series 1: chest ap · 0.14mm/px · 2 of 2 slices shown]
[im 1/2]
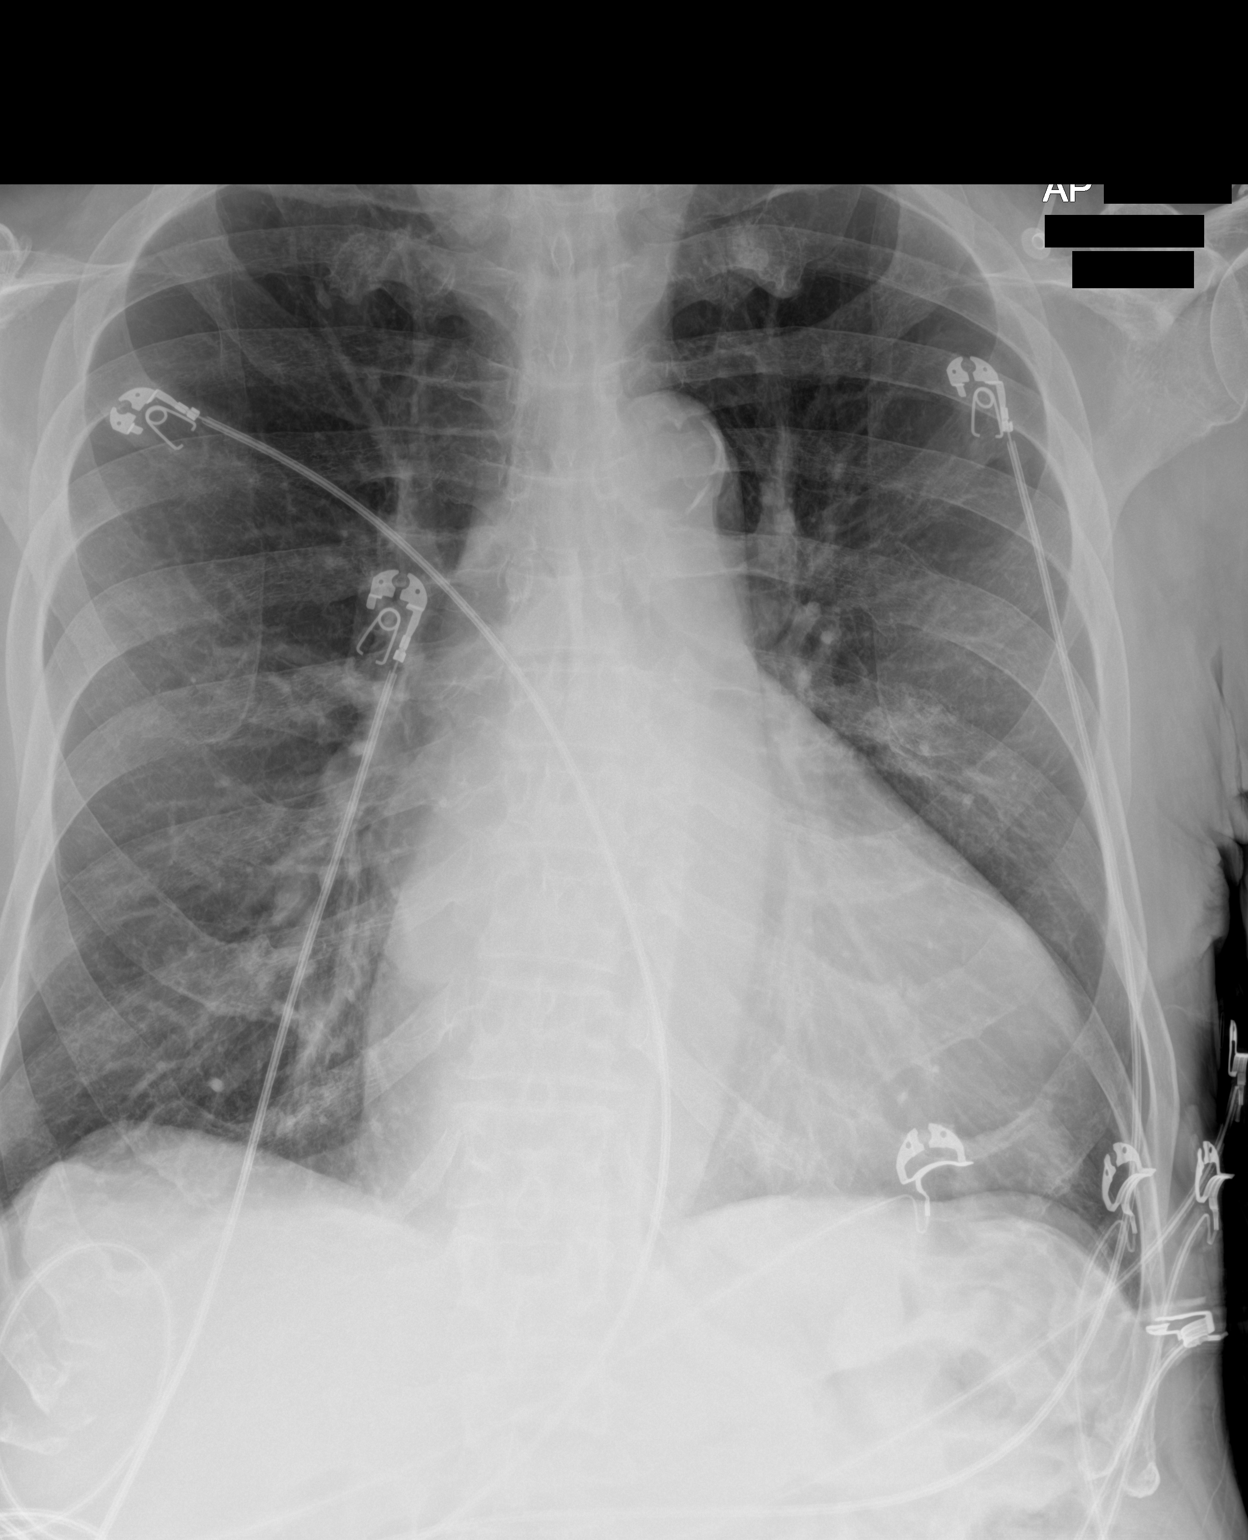
[im 2/2]
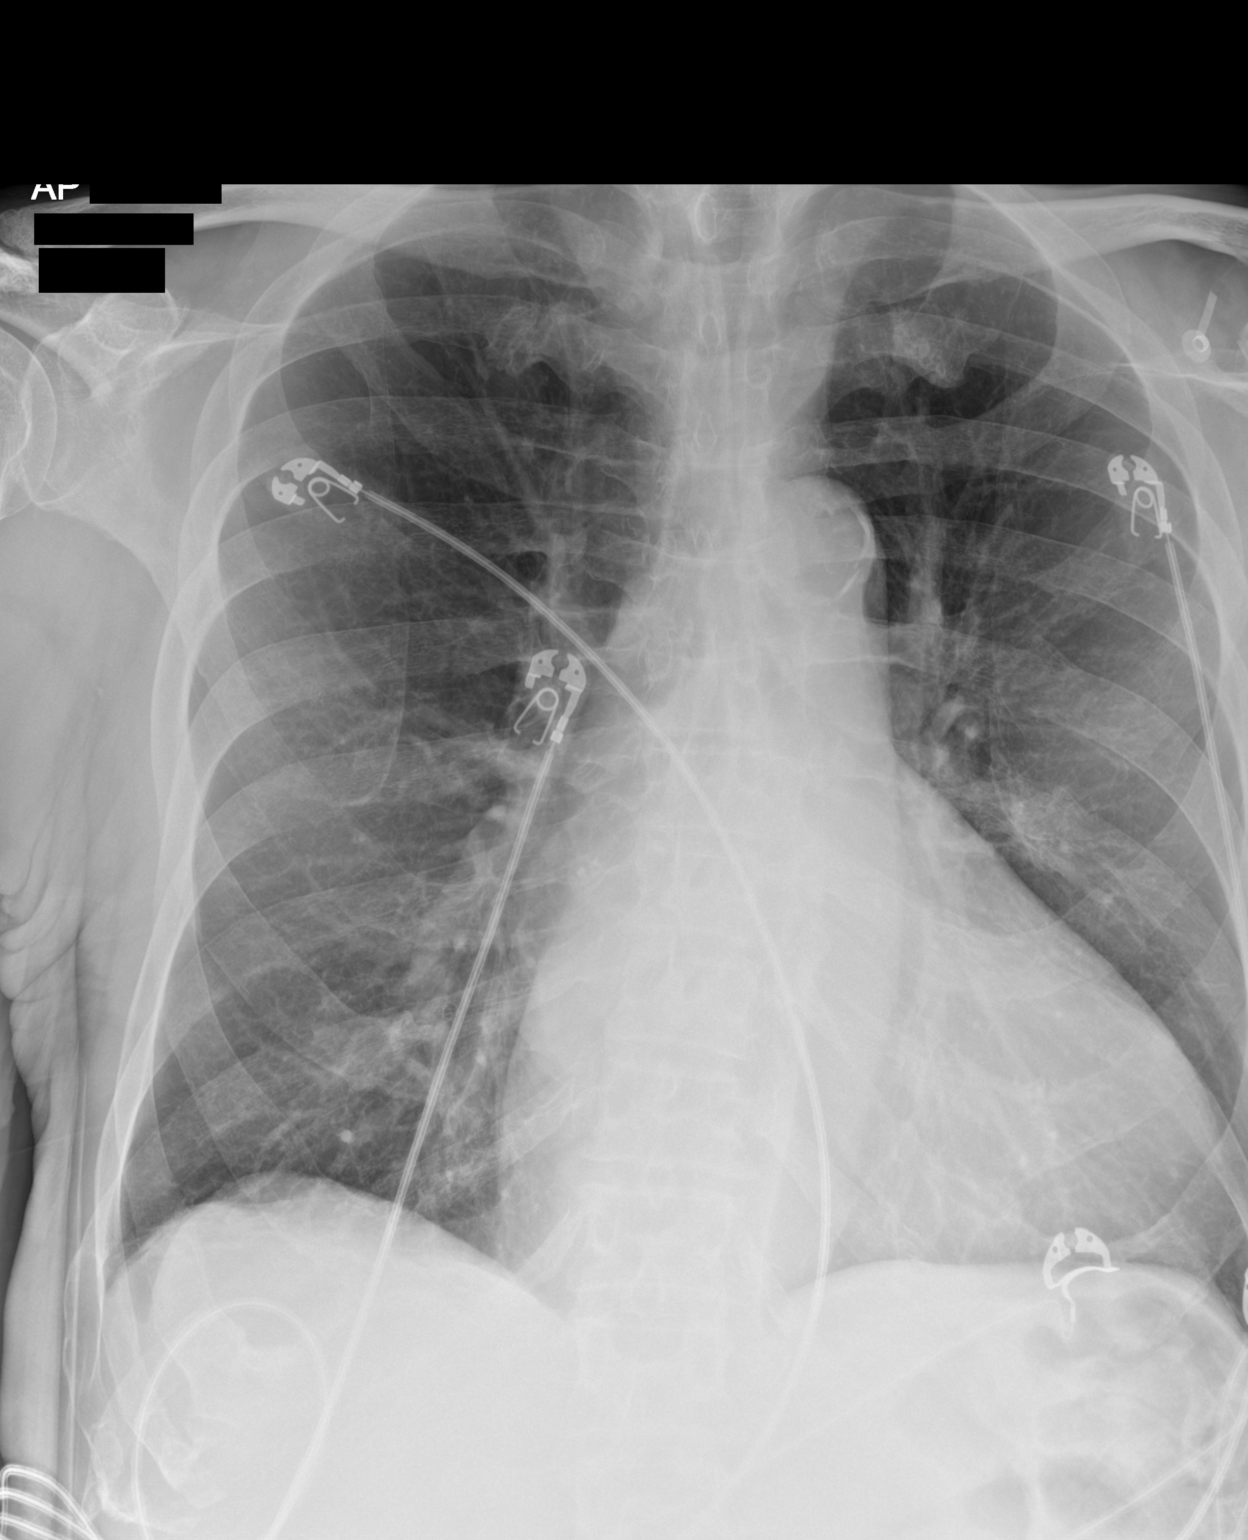

[2 of 2 positions shown; findings below may reference images not displayed]

FINDINGS: 2 frontal views of the chest demonstrate mild enlargement the
cardiac silhouette, likely exacerbated by portable AP technique. No
airspace disease, effusion, or pneumothorax. No acute bony
abnormalities.
IMPRESSION: 1. Mild enlargement of the cardiac silhouette.
2. No acute intrathoracic process.

## 2022-01-30 IMAGING — DX DG CHEST 1V PORT
1 series · 1 of 1 positions shown · non-contrast
Comparison: 05/26/2020

CLINICAL DATA: Intubation.

EXAM:
PORTABLE CHEST 1 VIEW

[chest ap]
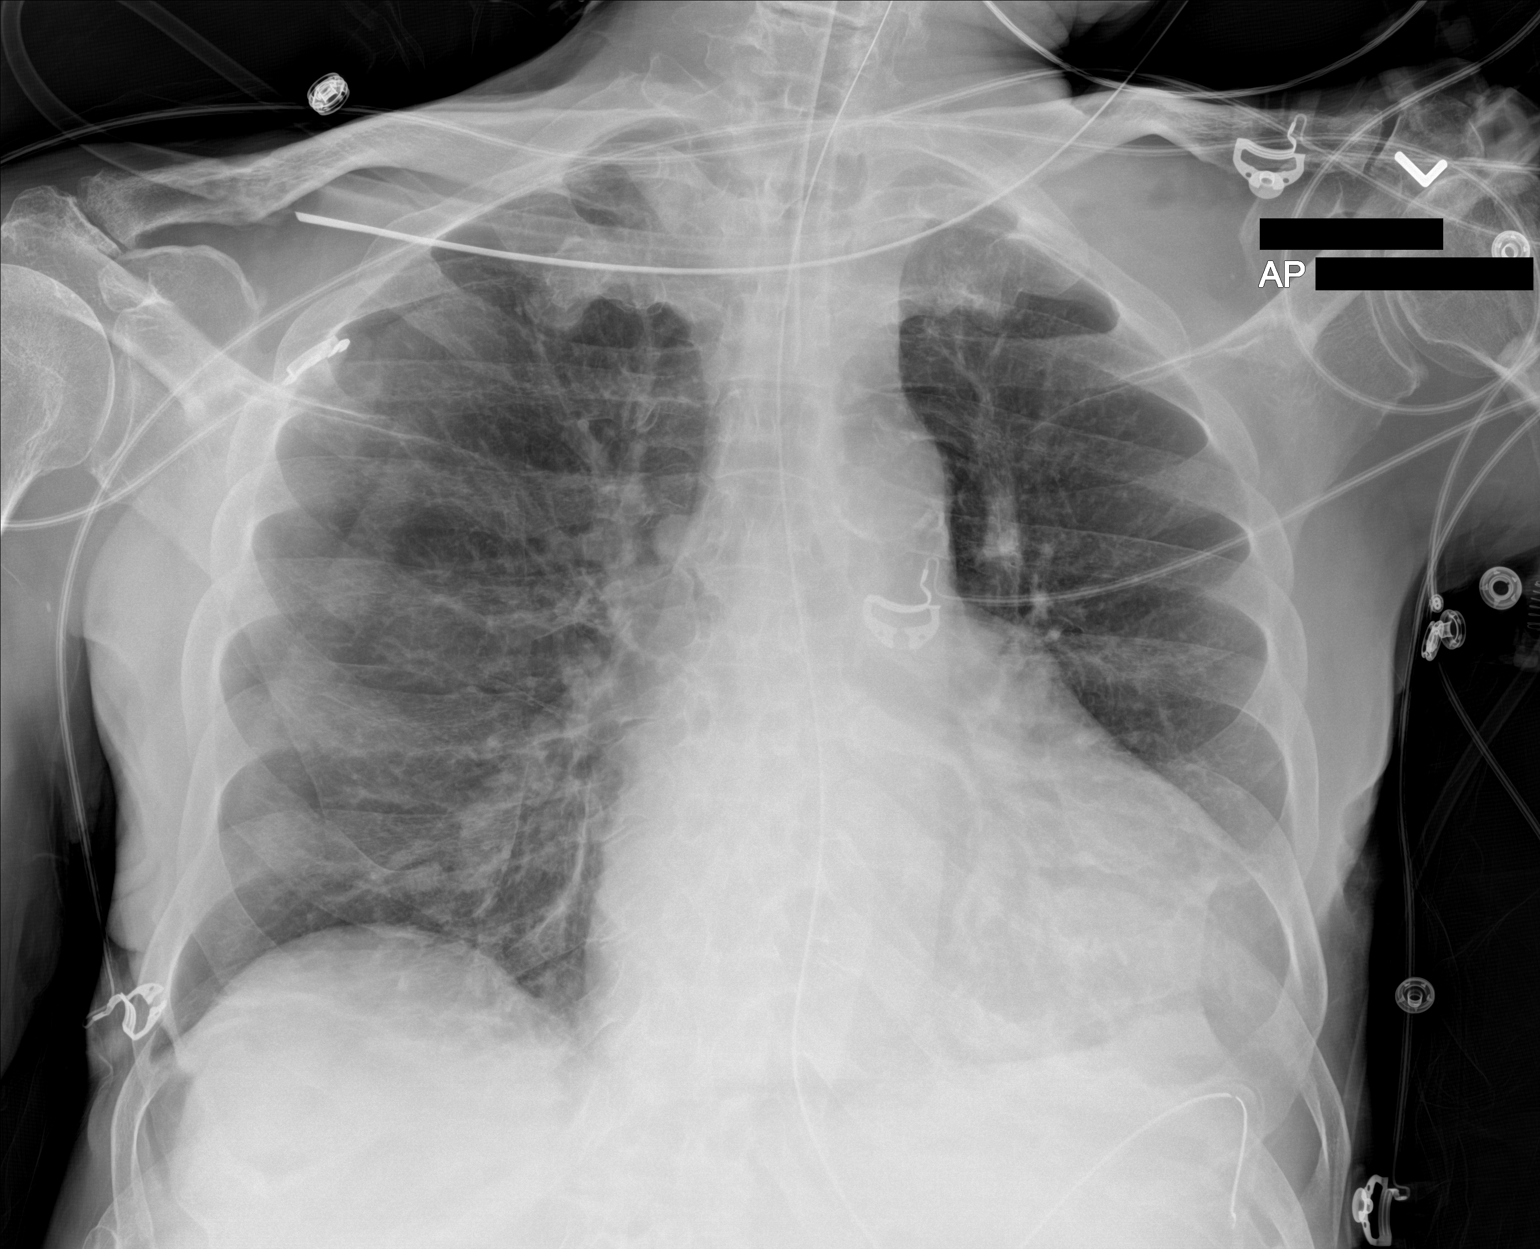

[1 of 1 positions shown; findings below may reference images not displayed]

FINDINGS: The ET tube is 4.9 cm above the carina.

The NG tube is coursing down the esophagus and into the stomach.

The heart is enlarged but appears stable.

Stable tortuosity and calcification of the thoracic aorta.

No infiltrates, effusions or edema. Streaky bibasilar atelectasis.
IMPRESSION: 1. ET tube and NG tubes in good position.
2. Streaky bibasilar atelectasis.

## 2022-02-04 IMAGING — DX DG CHEST 1V PORT
1 series · 1 of 1 positions shown · non-contrast
Comparison: 05/27/2020, CT 04/24/2020

CLINICAL DATA: Central line placement

EXAM:
PORTABLE CHEST 1 VIEW

[chest ap]
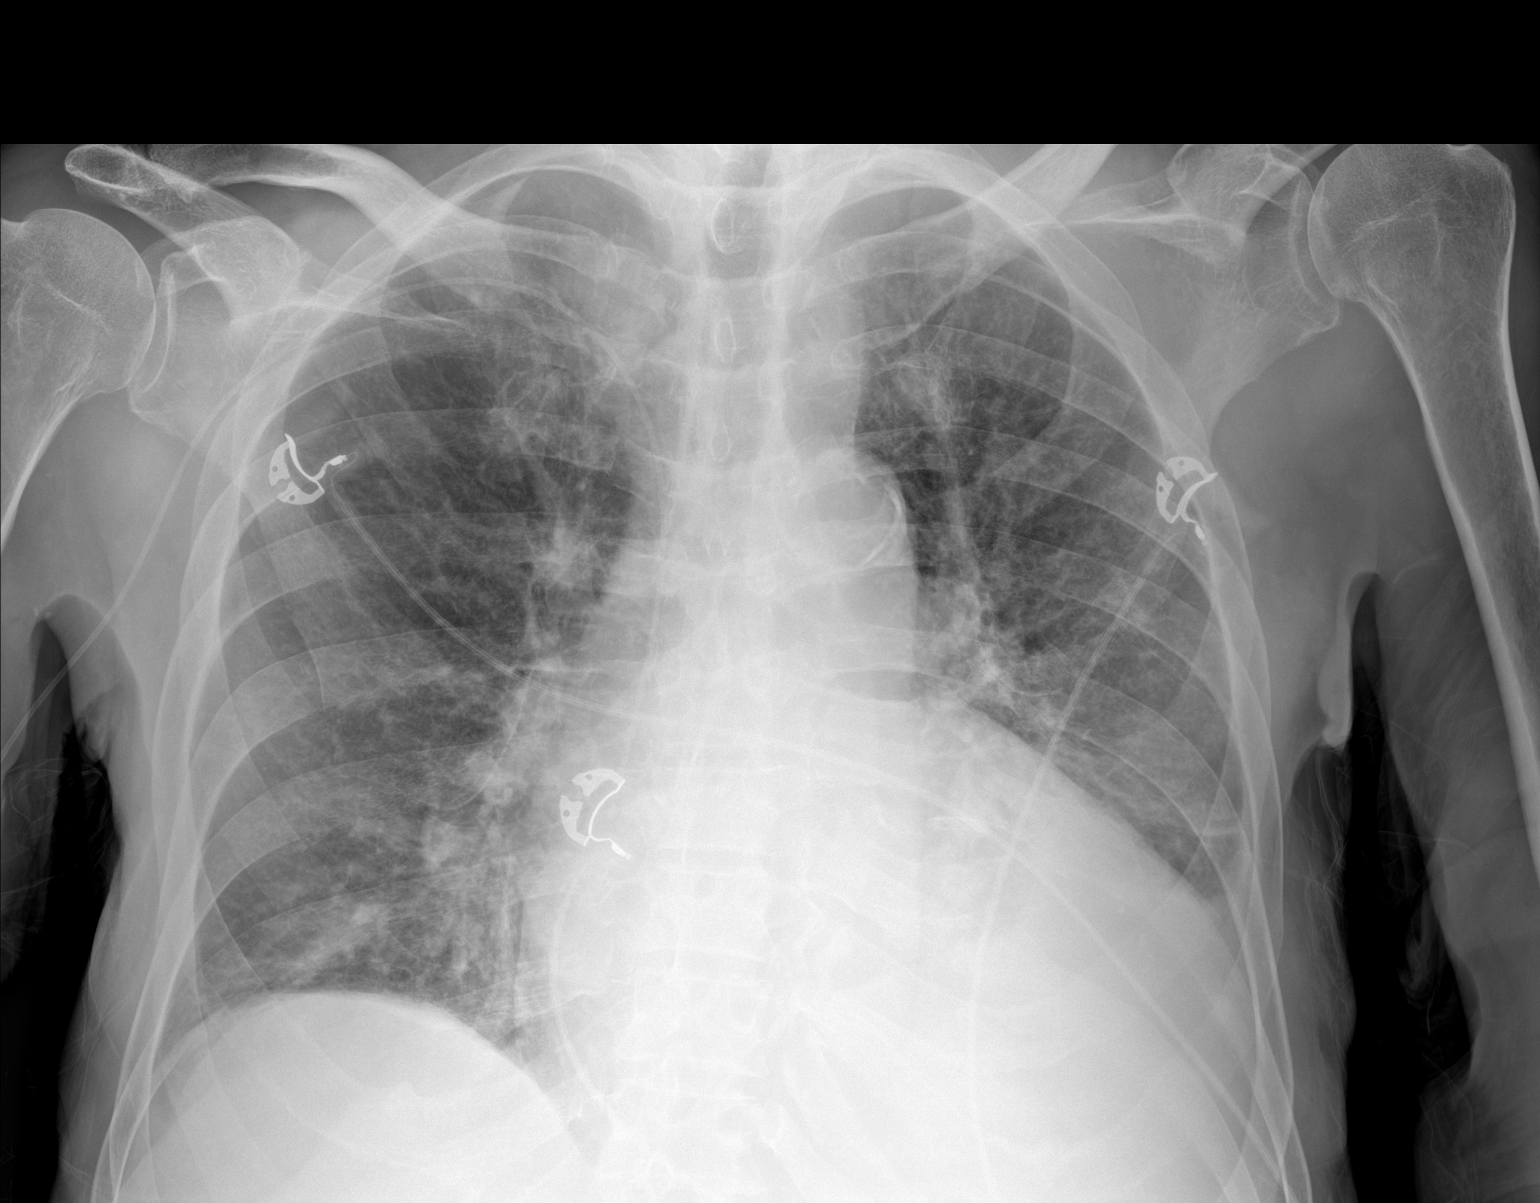

[1 of 1 positions shown; findings below may reference images not displayed]

FINDINGS: Right upper extremity central venous catheter tip partially obscured
by telemetry lead, it appears to project over the cavoatrial region.
Endotracheal and esophageal tubes have been removed. Cardiomegaly
with mild central congestion. Worsened consolidation at left lung
base. Possible left pleural effusion.
IMPRESSION: 1. Interval removal of endotracheal and esophageal tubes. Right
upper extremity central venous catheter tip partially obscured by
overlying telemetry leads, the tip appears to project over the
cavoatrial region.
2. Cardiomegaly with vascular congestion. Worsened consolidation at
the left lung base with possible left effusion.
